# Patient Record
Sex: Female | Born: 1960
Health system: Southern US, Community
[De-identification: ages and names within clinical notes are randomized; demographics above are authoritative.]

## PROBLEM LIST (undated history)

## (undated) DIAGNOSIS — F32A Depression, unspecified: Secondary | ICD-10-CM

## (undated) DIAGNOSIS — E785 Hyperlipidemia, unspecified: Secondary | ICD-10-CM

## (undated) DIAGNOSIS — E119 Type 2 diabetes mellitus without complications: Secondary | ICD-10-CM

## (undated) DIAGNOSIS — I10 Essential (primary) hypertension: Secondary | ICD-10-CM

## (undated) HISTORY — DX: Hyperlipidemia, unspecified: E78.5

## (undated) HISTORY — PX: COLONOSCOPY: SHX174

## (undated) HISTORY — DX: Depression, unspecified: F32.A

---
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
## 1998-01-10 ENCOUNTER — Emergency Department (HOSPITAL_COMMUNITY): Admission: EM | Admit: 1998-01-10 | Discharge: 1998-01-10 | Payer: Self-pay | Admitting: Emergency Medicine

## 1998-01-10 ENCOUNTER — Encounter: Payer: Self-pay | Admitting: Emergency Medicine

## 1998-06-18 ENCOUNTER — Other Ambulatory Visit: Admission: RE | Admit: 1998-06-18 | Discharge: 1998-06-18 | Payer: Self-pay | Admitting: *Deleted

## 2002-11-13 ENCOUNTER — Emergency Department (HOSPITAL_COMMUNITY): Admission: EM | Admit: 2002-11-13 | Discharge: 2002-11-13 | Payer: Self-pay | Admitting: Emergency Medicine

## 2003-05-01 ENCOUNTER — Emergency Department (HOSPITAL_COMMUNITY): Admission: EM | Admit: 2003-05-01 | Discharge: 2003-05-01 | Payer: Self-pay | Admitting: Emergency Medicine

## 2003-06-18 ENCOUNTER — Other Ambulatory Visit: Admission: RE | Admit: 2003-06-18 | Discharge: 2003-06-18 | Payer: Self-pay | Admitting: Obstetrics and Gynecology

## 2003-11-29 ENCOUNTER — Ambulatory Visit (HOSPITAL_COMMUNITY): Admission: RE | Admit: 2003-11-29 | Discharge: 2003-11-29 | Payer: Self-pay | Admitting: Internal Medicine

## 2003-12-11 ENCOUNTER — Ambulatory Visit (HOSPITAL_COMMUNITY): Admission: RE | Admit: 2003-12-11 | Discharge: 2003-12-11 | Payer: Self-pay | Admitting: Internal Medicine

## 2004-06-11 ENCOUNTER — Other Ambulatory Visit: Admission: RE | Admit: 2004-06-11 | Discharge: 2004-06-11 | Payer: Self-pay | Admitting: Obstetrics and Gynecology

## 2005-03-23 ENCOUNTER — Ambulatory Visit (HOSPITAL_COMMUNITY): Admission: RE | Admit: 2005-03-23 | Discharge: 2005-03-23 | Payer: Self-pay | Admitting: Internal Medicine

## 2005-06-14 ENCOUNTER — Other Ambulatory Visit: Admission: RE | Admit: 2005-06-14 | Discharge: 2005-06-14 | Payer: Self-pay | Admitting: Obstetrics and Gynecology

## 2005-11-24 ENCOUNTER — Encounter: Admission: RE | Admit: 2005-11-24 | Discharge: 2005-11-24 | Payer: Self-pay | Admitting: Obstetrics and Gynecology

## 2006-04-28 ENCOUNTER — Ambulatory Visit (HOSPITAL_COMMUNITY): Admission: RE | Admit: 2006-04-28 | Discharge: 2006-04-28 | Payer: Self-pay | Admitting: Obstetrics and Gynecology

## 2007-03-10 ENCOUNTER — Ambulatory Visit (HOSPITAL_COMMUNITY): Admission: RE | Admit: 2007-03-10 | Discharge: 2007-03-10 | Payer: Self-pay | Admitting: Internal Medicine

## 2007-05-10 ENCOUNTER — Ambulatory Visit (HOSPITAL_COMMUNITY): Admission: RE | Admit: 2007-05-10 | Discharge: 2007-05-10 | Payer: Self-pay | Admitting: Obstetrics and Gynecology

## 2007-07-24 ENCOUNTER — Emergency Department (HOSPITAL_COMMUNITY): Admission: EM | Admit: 2007-07-24 | Discharge: 2007-07-24 | Payer: Self-pay | Admitting: Family Medicine

## 2008-07-04 ENCOUNTER — Encounter: Admission: RE | Admit: 2008-07-04 | Discharge: 2008-07-04 | Payer: Self-pay | Admitting: Obstetrics and Gynecology

## 2008-09-10 ENCOUNTER — Ambulatory Visit (HOSPITAL_COMMUNITY): Admission: RE | Admit: 2008-09-10 | Discharge: 2008-09-10 | Payer: Self-pay | Admitting: Urology

## 2009-09-25 ENCOUNTER — Encounter: Admission: RE | Admit: 2009-09-25 | Discharge: 2009-09-25 | Payer: Self-pay | Admitting: Obstetrics & Gynecology

## 2010-02-28 ENCOUNTER — Encounter: Payer: Self-pay | Admitting: Obstetrics and Gynecology

## 2010-03-01 ENCOUNTER — Encounter: Payer: Self-pay | Admitting: Obstetrics and Gynecology

## 2010-12-14 ENCOUNTER — Other Ambulatory Visit: Payer: Self-pay | Admitting: Obstetrics & Gynecology

## 2010-12-14 DIAGNOSIS — Z1231 Encounter for screening mammogram for malignant neoplasm of breast: Secondary | ICD-10-CM

## 2011-01-19 ENCOUNTER — Ambulatory Visit: Payer: Self-pay

## 2011-03-01 ENCOUNTER — Ambulatory Visit (HOSPITAL_COMMUNITY)
Admission: RE | Admit: 2011-03-01 | Discharge: 2011-03-01 | Disposition: A | Discharge status: Home / Self Care | Payer: BC Managed Care – PPO | Source: Ambulatory Visit | Attending: Obstetrics & Gynecology | Admitting: Obstetrics & Gynecology

## 2011-03-01 DIAGNOSIS — Z1231 Encounter for screening mammogram for malignant neoplasm of breast: Secondary | ICD-10-CM | POA: Insufficient documentation

## 2012-05-03 ENCOUNTER — Ambulatory Visit: Payer: BC Managed Care – PPO | Admitting: *Deleted

## 2012-05-16 ENCOUNTER — Other Ambulatory Visit (HOSPITAL_COMMUNITY): Payer: Self-pay | Admitting: Obstetrics and Gynecology

## 2012-05-16 DIAGNOSIS — Z139 Encounter for screening, unspecified: Secondary | ICD-10-CM

## 2012-05-19 ENCOUNTER — Ambulatory Visit: Payer: BC Managed Care – PPO | Admitting: Dietician

## 2012-05-22 ENCOUNTER — Ambulatory Visit (HOSPITAL_COMMUNITY)
Admission: RE | Admit: 2012-05-22 | Discharge: 2012-05-22 | Disposition: A | Discharge status: Home / Self Care | Payer: BC Managed Care – PPO | Source: Ambulatory Visit | Attending: Obstetrics and Gynecology | Admitting: Obstetrics and Gynecology

## 2012-05-22 DIAGNOSIS — Z1231 Encounter for screening mammogram for malignant neoplasm of breast: Secondary | ICD-10-CM | POA: Insufficient documentation

## 2012-05-22 DIAGNOSIS — Z139 Encounter for screening, unspecified: Secondary | ICD-10-CM

## 2012-06-12 ENCOUNTER — Ambulatory Visit: Payer: BC Managed Care – PPO | Admitting: Dietician

## 2012-12-15 ENCOUNTER — Telehealth: Payer: Self-pay

## 2012-12-15 NOTE — Telephone Encounter (Signed)
Pt was referred by Dr. Fanta for screening colonoscopy. LMOM for a return call.  

## 2013-01-16 NOTE — Telephone Encounter (Signed)
Letter to PCP

## 2013-08-29 ENCOUNTER — Other Ambulatory Visit: Payer: Self-pay

## 2013-08-29 DIAGNOSIS — Z1231 Encounter for screening mammogram for malignant neoplasm of breast: Secondary | ICD-10-CM

## 2013-09-20 ENCOUNTER — Ambulatory Visit: Payer: BC Managed Care – PPO

## 2013-10-03 ENCOUNTER — Ambulatory Visit: Payer: BC Managed Care – PPO

## 2013-10-04 ENCOUNTER — Ambulatory Visit
Admission: RE | Admit: 2013-10-04 | Discharge: 2013-10-04 | Disposition: A | Discharge status: Home / Self Care | Payer: BC Managed Care – PPO | Source: Ambulatory Visit

## 2013-10-04 DIAGNOSIS — Z1231 Encounter for screening mammogram for malignant neoplasm of breast: Secondary | ICD-10-CM

## 2013-10-05 ENCOUNTER — Other Ambulatory Visit: Payer: Self-pay | Admitting: Obstetrics & Gynecology

## 2013-10-05 DIAGNOSIS — R928 Other abnormal and inconclusive findings on diagnostic imaging of breast: Secondary | ICD-10-CM

## 2013-10-19 ENCOUNTER — Other Ambulatory Visit: Payer: BC Managed Care – PPO

## 2013-10-23 ENCOUNTER — Ambulatory Visit
Admission: RE | Admit: 2013-10-23 | Discharge: 2013-10-23 | Disposition: A | Discharge status: Home / Self Care | Payer: BC Managed Care – PPO | Source: Ambulatory Visit | Attending: Obstetrics & Gynecology | Admitting: Obstetrics & Gynecology

## 2013-10-23 DIAGNOSIS — R928 Other abnormal and inconclusive findings on diagnostic imaging of breast: Secondary | ICD-10-CM

## 2015-03-20 ENCOUNTER — Other Ambulatory Visit (HOSPITAL_COMMUNITY): Payer: Self-pay | Admitting: Obstetrics & Gynecology

## 2015-03-20 DIAGNOSIS — Z1231 Encounter for screening mammogram for malignant neoplasm of breast: Secondary | ICD-10-CM

## 2015-04-23 ENCOUNTER — Ambulatory Visit (HOSPITAL_COMMUNITY): Payer: Self-pay

## 2015-04-30 ENCOUNTER — Ambulatory Visit (HOSPITAL_COMMUNITY)
Admission: RE | Admit: 2015-04-30 | Discharge: 2015-04-30 | Disposition: A | Discharge status: Home / Self Care | Payer: 59 | Source: Ambulatory Visit | Attending: Obstetrics & Gynecology | Admitting: Obstetrics & Gynecology

## 2015-04-30 DIAGNOSIS — Z1231 Encounter for screening mammogram for malignant neoplasm of breast: Secondary | ICD-10-CM

## 2016-06-30 ENCOUNTER — Other Ambulatory Visit (HOSPITAL_COMMUNITY): Payer: Self-pay | Admitting: Obstetrics & Gynecology

## 2016-06-30 ENCOUNTER — Other Ambulatory Visit: Payer: Self-pay | Admitting: Obstetrics & Gynecology

## 2016-06-30 DIAGNOSIS — Z1231 Encounter for screening mammogram for malignant neoplasm of breast: Secondary | ICD-10-CM

## 2016-07-07 ENCOUNTER — Ambulatory Visit (HOSPITAL_COMMUNITY): Payer: 59

## 2016-07-22 DIAGNOSIS — E663 Overweight: Secondary | ICD-10-CM | POA: Diagnosis not present

## 2016-07-22 DIAGNOSIS — I10 Essential (primary) hypertension: Secondary | ICD-10-CM | POA: Diagnosis not present

## 2016-07-22 DIAGNOSIS — E1165 Type 2 diabetes mellitus with hyperglycemia: Secondary | ICD-10-CM | POA: Diagnosis not present

## 2016-07-22 DIAGNOSIS — Z0000 Encounter for general adult medical examination without abnormal findings: Secondary | ICD-10-CM | POA: Diagnosis not present

## 2016-07-27 DIAGNOSIS — Z6830 Body mass index (BMI) 30.0-30.9, adult: Secondary | ICD-10-CM | POA: Diagnosis not present

## 2016-07-27 DIAGNOSIS — E784 Other hyperlipidemia: Secondary | ICD-10-CM | POA: Diagnosis not present

## 2016-07-27 DIAGNOSIS — E119 Type 2 diabetes mellitus without complications: Secondary | ICD-10-CM | POA: Diagnosis not present

## 2016-07-27 DIAGNOSIS — I10 Essential (primary) hypertension: Secondary | ICD-10-CM | POA: Diagnosis not present

## 2017-05-31 DIAGNOSIS — Z0001 Encounter for general adult medical examination with abnormal findings: Secondary | ICD-10-CM | POA: Diagnosis not present

## 2017-05-31 DIAGNOSIS — E119 Type 2 diabetes mellitus without complications: Secondary | ICD-10-CM | POA: Diagnosis not present

## 2017-05-31 DIAGNOSIS — I10 Essential (primary) hypertension: Secondary | ICD-10-CM | POA: Diagnosis not present

## 2017-05-31 DIAGNOSIS — E7849 Other hyperlipidemia: Secondary | ICD-10-CM | POA: Diagnosis not present

## 2017-06-09 DIAGNOSIS — E663 Overweight: Secondary | ICD-10-CM | POA: Diagnosis not present

## 2017-06-09 DIAGNOSIS — E7849 Other hyperlipidemia: Secondary | ICD-10-CM | POA: Diagnosis not present

## 2017-06-09 DIAGNOSIS — E119 Type 2 diabetes mellitus without complications: Secondary | ICD-10-CM | POA: Diagnosis not present

## 2017-06-09 DIAGNOSIS — I10 Essential (primary) hypertension: Secondary | ICD-10-CM | POA: Diagnosis not present

## 2017-06-09 DIAGNOSIS — E1165 Type 2 diabetes mellitus with hyperglycemia: Secondary | ICD-10-CM | POA: Diagnosis not present

## 2017-06-09 DIAGNOSIS — Z0000 Encounter for general adult medical examination without abnormal findings: Secondary | ICD-10-CM | POA: Diagnosis not present

## 2017-12-14 DIAGNOSIS — Z0000 Encounter for general adult medical examination without abnormal findings: Secondary | ICD-10-CM | POA: Diagnosis not present

## 2017-12-14 DIAGNOSIS — E1165 Type 2 diabetes mellitus with hyperglycemia: Secondary | ICD-10-CM | POA: Diagnosis not present

## 2017-12-14 DIAGNOSIS — I10 Essential (primary) hypertension: Secondary | ICD-10-CM | POA: Diagnosis not present

## 2017-12-14 DIAGNOSIS — E119 Type 2 diabetes mellitus without complications: Secondary | ICD-10-CM | POA: Diagnosis not present

## 2018-01-11 DIAGNOSIS — I10 Essential (primary) hypertension: Secondary | ICD-10-CM | POA: Diagnosis not present

## 2018-01-11 DIAGNOSIS — F411 Generalized anxiety disorder: Secondary | ICD-10-CM | POA: Diagnosis not present

## 2018-01-11 DIAGNOSIS — E119 Type 2 diabetes mellitus without complications: Secondary | ICD-10-CM | POA: Diagnosis not present

## 2018-01-11 DIAGNOSIS — Z6830 Body mass index (BMI) 30.0-30.9, adult: Secondary | ICD-10-CM | POA: Diagnosis not present

## 2018-03-07 DIAGNOSIS — Z1231 Encounter for screening mammogram for malignant neoplasm of breast: Secondary | ICD-10-CM | POA: Diagnosis not present

## 2018-03-07 DIAGNOSIS — Z113 Encounter for screening for infections with a predominantly sexual mode of transmission: Secondary | ICD-10-CM | POA: Diagnosis not present

## 2018-03-07 DIAGNOSIS — Z1151 Encounter for screening for human papillomavirus (HPV): Secondary | ICD-10-CM | POA: Diagnosis not present

## 2018-03-07 DIAGNOSIS — Z6833 Body mass index (BMI) 33.0-33.9, adult: Secondary | ICD-10-CM | POA: Diagnosis not present

## 2018-03-07 DIAGNOSIS — R300 Dysuria: Secondary | ICD-10-CM | POA: Diagnosis not present

## 2018-03-07 DIAGNOSIS — Z01419 Encounter for gynecological examination (general) (routine) without abnormal findings: Secondary | ICD-10-CM | POA: Diagnosis not present

## 2018-06-08 DIAGNOSIS — I10 Essential (primary) hypertension: Secondary | ICD-10-CM | POA: Diagnosis not present

## 2018-06-08 DIAGNOSIS — E119 Type 2 diabetes mellitus without complications: Secondary | ICD-10-CM | POA: Diagnosis not present

## 2018-06-08 DIAGNOSIS — L209 Atopic dermatitis, unspecified: Secondary | ICD-10-CM | POA: Diagnosis not present

## 2018-06-12 DIAGNOSIS — F411 Generalized anxiety disorder: Secondary | ICD-10-CM | POA: Diagnosis not present

## 2018-06-12 DIAGNOSIS — F4320 Adjustment disorder, unspecified: Secondary | ICD-10-CM | POA: Diagnosis not present

## 2018-06-27 ENCOUNTER — Other Ambulatory Visit: Payer: Self-pay

## 2018-06-27 ENCOUNTER — Encounter (HOSPITAL_COMMUNITY): Payer: Self-pay | Admitting: Psychiatry

## 2018-06-27 ENCOUNTER — Ambulatory Visit (INDEPENDENT_AMBULATORY_CARE_PROVIDER_SITE_OTHER): Payer: 59 | Admitting: Psychiatry

## 2018-06-27 DIAGNOSIS — F4321 Adjustment disorder with depressed mood: Secondary | ICD-10-CM
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
## 2018-06-27 NOTE — Patient Instructions (Addendum)
1. No medication is started at this time. Please contact the office if any worsening in your symptoms/if interested in starting antidepressant.  2. Referral to therapy  3. Next appointment: 6/12 a 1:20  4. The front desk will contact you regarding FMLA paperwork, and obtaining record from Dr. Felecia Shelling

## 2018-06-28 ENCOUNTER — Telehealth (HOSPITAL_COMMUNITY): Payer: Self-pay | Admitting: Psychiatry

## 2018-06-28 NOTE — Telephone Encounter (Signed)
D:  Dr. Vanetta Shawl referred to [facility].  A:  Placed call to orient pt and give her a start date (07-04-18) at 9 a.m per patient's request.  Inform  Dr. Vanetta Shawl.  R:  Pt receptive.
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
## 2018-07-21 DIAGNOSIS — F4321 Adjustment disorder with depressed mood: Secondary | ICD-10-CM | POA: Diagnosis not present

## 2018-07-21 DIAGNOSIS — F438 Other reactions to severe stress: Secondary | ICD-10-CM | POA: Diagnosis not present

## 2018-07-25 ENCOUNTER — Ambulatory Visit (HOSPITAL_COMMUNITY): Payer: Self-pay | Admitting: Psychiatry

## 2018-07-25 ENCOUNTER — Other Ambulatory Visit: Payer: Self-pay

## 2018-07-25 ENCOUNTER — Encounter (HOSPITAL_COMMUNITY): Payer: Self-pay | Admitting: Psychiatry

## 2018-07-25 ENCOUNTER — Ambulatory Visit (INDEPENDENT_AMBULATORY_CARE_PROVIDER_SITE_OTHER): Payer: BC Managed Care – PPO | Admitting: Psychiatry

## 2018-07-25 DIAGNOSIS — F331 Major depressive disorder, recurrent, moderate: Secondary | ICD-10-CM | POA: Diagnosis not present

## 2018-07-25 MED ORDER — MIRTAZAPINE 15 MG PO TABS: ORAL_TABLET | ORAL | 0 refills | Status: DC

## 2018-07-25 NOTE — Patient Instructions (Signed)
1. Start mirtazapine 7.5 mg at night for one week, then 15 mg at night  2. Next appointment : 7/14 at 11:20

## 2018-07-27 ENCOUNTER — Telehealth (HOSPITAL_COMMUNITY): Payer: Self-pay | Admitting: Psychiatry

## 2018-07-27 DIAGNOSIS — F438 Other reactions to severe stress: Secondary | ICD-10-CM | POA: Diagnosis not present

## 2018-07-27 DIAGNOSIS — F4321 Adjustment disorder with depressed mood: Secondary | ICD-10-CM | POA: Diagnosis not present

## 2018-07-27 NOTE — Telephone Encounter (Signed)
Advise her to send fax to [place] if any forms/letter need to be submitted.

## 2018-07-27 NOTE — Telephone Encounter (Signed)
Called patient and informed her of message.  Patient understood

## 2018-07-27 NOTE — Telephone Encounter (Signed)
Patient called and said that she have been having a "bad couple of days".   She had a crying episode on 07-26-2018.  Patient said that feels like she isn't ready to go  back to work until her next appointment on 08/22/2018.  Please advise

## 2018-08-02 ENCOUNTER — Encounter (HOSPITAL_COMMUNITY): Payer: Self-pay | Admitting: Psychiatry

## 2018-08-02 ENCOUNTER — Telehealth (HOSPITAL_COMMUNITY): Payer: Self-pay | Admitting: *Deleted

## 2018-08-02 NOTE — Telephone Encounter (Signed)
DR HISADA PATIENT CAME INTO OFFICE STATING THAT SHE NEEDS A WORK NOTE STATING THE EXTENSION DISCUSSED FOR HER TO RETURN TO WORK ALONG WITH THE OFFICE NOTES FROM  07-25-2018

## 2018-08-02 NOTE — Telephone Encounter (Signed)
Letter is done.

## 2018-08-03 ENCOUNTER — Ambulatory Visit (HOSPITAL_COMMUNITY): Payer: Self-pay | Admitting: Psychiatry

## 2018-08-03 DIAGNOSIS — F4321 Adjustment disorder with depressed mood: Secondary | ICD-10-CM | POA: Diagnosis not present

## 2018-08-03 DIAGNOSIS — F438 Other reactions to severe stress: Secondary | ICD-10-CM | POA: Diagnosis not present
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
## 2018-08-17 DIAGNOSIS — F438 Other reactions to severe stress: Secondary | ICD-10-CM | POA: Diagnosis not present

## 2018-08-17 DIAGNOSIS — F4321 Adjustment disorder with depressed mood: Secondary | ICD-10-CM | POA: Diagnosis not present

## 2018-08-22 ENCOUNTER — Encounter (HOSPITAL_COMMUNITY): Payer: Self-pay | Admitting: Psychiatry

## 2018-08-22 ENCOUNTER — Ambulatory Visit (INDEPENDENT_AMBULATORY_CARE_PROVIDER_SITE_OTHER): Payer: BC Managed Care – PPO | Admitting: Psychiatry

## 2018-08-22 ENCOUNTER — Other Ambulatory Visit: Payer: Self-pay

## 2018-08-22 DIAGNOSIS — F331 Major depressive disorder, recurrent, moderate: Secondary | ICD-10-CM

## 2018-08-22 MED ORDER — ESCITALOPRAM OXALATE 10 MG PO TABS: ORAL_TABLET | ORAL | 0 refills | Status: DC

## 2018-08-22 NOTE — Patient Instructions (Signed)
1. Continue mirtazapine 7.5 mg daily  2. Start lexapro 5 mg daily for one week, then 10 mg daily  3. Next appointment: 8/13 at 10 AM

## 2018-08-24 DIAGNOSIS — F438 Other reactions to severe stress: Secondary | ICD-10-CM | POA: Diagnosis not present

## 2018-08-24 DIAGNOSIS — F4321 Adjustment disorder with depressed mood: Secondary | ICD-10-CM | POA: Diagnosis not present

## 2018-08-31 DIAGNOSIS — F438 Other reactions to severe stress: Secondary | ICD-10-CM | POA: Diagnosis not present

## 2018-08-31 DIAGNOSIS — F4321 Adjustment disorder with depressed mood: Secondary | ICD-10-CM | POA: Diagnosis not present

## 2018-09-07 DIAGNOSIS — E1165 Type 2 diabetes mellitus with hyperglycemia: Secondary | ICD-10-CM | POA: Diagnosis not present

## 2018-09-07 DIAGNOSIS — Z0001 Encounter for general adult medical examination with abnormal findings: Secondary | ICD-10-CM | POA: Diagnosis not present

## 2018-09-07 DIAGNOSIS — I10 Essential (primary) hypertension: Secondary | ICD-10-CM | POA: Diagnosis not present

## 2018-09-07 DIAGNOSIS — E785 Hyperlipidemia, unspecified: Secondary | ICD-10-CM | POA: Diagnosis not present

## 2018-09-13 DIAGNOSIS — I10 Essential (primary) hypertension: Secondary | ICD-10-CM | POA: Diagnosis not present

## 2018-09-13 DIAGNOSIS — E7849 Other hyperlipidemia: Secondary | ICD-10-CM | POA: Diagnosis not present

## 2018-09-13 DIAGNOSIS — Z0001 Encounter for general adult medical examination with abnormal findings: Secondary | ICD-10-CM | POA: Diagnosis not present

## 2018-09-13 DIAGNOSIS — E119 Type 2 diabetes mellitus without complications: Secondary | ICD-10-CM | POA: Diagnosis not present

## 2018-09-14 DIAGNOSIS — F4321 Adjustment disorder with depressed mood: Secondary | ICD-10-CM | POA: Diagnosis not present

## 2018-09-14 DIAGNOSIS — F438 Other reactions to severe stress: Secondary | ICD-10-CM | POA: Diagnosis not present
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
## 2018-09-21 ENCOUNTER — Ambulatory Visit (INDEPENDENT_AMBULATORY_CARE_PROVIDER_SITE_OTHER): Payer: BC Managed Care – PPO | Admitting: Psychiatry

## 2018-09-21 ENCOUNTER — Other Ambulatory Visit: Payer: Self-pay

## 2018-09-21 ENCOUNTER — Encounter (HOSPITAL_COMMUNITY): Payer: Self-pay | Admitting: Psychiatry

## 2018-09-21 DIAGNOSIS — F331 Major depressive disorder, recurrent, moderate: Secondary | ICD-10-CM | POA: Diagnosis not present

## 2018-09-21 MED ORDER — ESCITALOPRAM OXALATE 20 MG PO TABS: 20.0000 mg | ORAL_TABLET | Freq: Every day | ORAL | 0 refills | Status: DC

## 2018-09-21 MED ORDER — MIRTAZAPINE 15 MG PO TABS: 7.5000 mg | ORAL_TABLET | Freq: Every day | ORAL | 0 refills | Status: DC

## 2018-09-21 NOTE — Patient Instructions (Addendum)
1. Increase lexapro 20 mg daily  2. Continue mirtazapine 7.5 mg daily 3. Next appointment: 9/17 at 10 AM

## 2018-09-28 DIAGNOSIS — F438 Other reactions to severe stress: Secondary | ICD-10-CM | POA: Diagnosis not present

## 2018-09-28 DIAGNOSIS — F4321 Adjustment disorder with depressed mood: Secondary | ICD-10-CM | POA: Diagnosis not present

## 2018-10-18 ENCOUNTER — Other Ambulatory Visit (HOSPITAL_COMMUNITY): Payer: Self-pay | Admitting: Psychiatry

## 2018-10-18 MED ORDER — ESCITALOPRAM OXALATE 20 MG PO TABS: 20.0000 mg | ORAL_TABLET | Freq: Every day | ORAL | 0 refills | Status: DC

## 2018-10-19 DIAGNOSIS — F4321 Adjustment disorder with depressed mood: Secondary | ICD-10-CM | POA: Diagnosis not present

## 2018-10-19 DIAGNOSIS — F438 Other reactions to severe stress: Secondary | ICD-10-CM | POA: Diagnosis not present
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
## 2018-10-26 ENCOUNTER — Other Ambulatory Visit: Payer: Self-pay

## 2018-10-26 ENCOUNTER — Ambulatory Visit (INDEPENDENT_AMBULATORY_CARE_PROVIDER_SITE_OTHER): Payer: BC Managed Care – PPO | Admitting: Psychiatry

## 2018-10-26 ENCOUNTER — Encounter (HOSPITAL_COMMUNITY): Payer: Self-pay | Admitting: Psychiatry

## 2018-10-26 DIAGNOSIS — F331 Major depressive disorder, recurrent, moderate: Secondary | ICD-10-CM

## 2018-10-26 MED ORDER — ESCITALOPRAM OXALATE 20 MG PO TABS: 20.0000 mg | ORAL_TABLET | Freq: Every day | ORAL | 0 refills | Status: DC

## 2018-10-26 MED ORDER — MIRTAZAPINE 15 MG PO TABS: 7.5000 mg | ORAL_TABLET | Freq: Every day | ORAL | 0 refills | Status: DC

## 2018-10-26 NOTE — Patient Instructions (Signed)
1. Continue lexapro 20 mg daily  2. Continue mirtazapine 7.5 mg daily  3. Next appointment: 10/15 at 9:30

## 2018-11-02 DIAGNOSIS — F438 Other reactions to severe stress: Secondary | ICD-10-CM | POA: Diagnosis not present

## 2018-11-02 DIAGNOSIS — F4321 Adjustment disorder with depressed mood: Secondary | ICD-10-CM | POA: Diagnosis not present
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
## 2018-11-22 ENCOUNTER — Encounter (HOSPITAL_COMMUNITY): Payer: Self-pay | Admitting: Psychiatry

## 2018-11-22 ENCOUNTER — Ambulatory Visit (INDEPENDENT_AMBULATORY_CARE_PROVIDER_SITE_OTHER): Payer: BC Managed Care – PPO | Admitting: Psychiatry

## 2018-11-22 ENCOUNTER — Other Ambulatory Visit: Payer: Self-pay

## 2018-11-22 DIAGNOSIS — F330 Major depressive disorder, recurrent, mild: Secondary | ICD-10-CM

## 2018-11-22 MED ORDER — ESCITALOPRAM OXALATE 20 MG PO TABS: 20.0000 mg | ORAL_TABLET | Freq: Every day | ORAL | 1 refills | Status: DC

## 2018-11-22 MED ORDER — MIRTAZAPINE 15 MG PO TABS: 7.5000 mg | ORAL_TABLET | Freq: Every day | ORAL | 1 refills | Status: DC

## 2018-11-22 NOTE — Patient Instructions (Signed)
1.Continuelexapro 20 mg daily  2. Continue mirtazapine 7.5 mg daily  3. Next appointment:as needed (would recommend to have follow up in January if your insurance covers this visit. If not, please consider follow up with your primary care doctor)

## 2018-11-23 ENCOUNTER — Ambulatory Visit (HOSPITAL_COMMUNITY): Payer: BC Managed Care – PPO | Admitting: Psychiatry

## 2019-06-05 ENCOUNTER — Telehealth (HOSPITAL_COMMUNITY): Payer: Self-pay | Admitting: *Deleted

## 2019-06-05 NOTE — Telephone Encounter (Signed)
Dr Cory Roughen -# [phone] has called for patients Psychiatric Disability Claim  Call back requested.

## 2019-06-06 NOTE — Telephone Encounter (Signed)
Could you contact Dr Cory Roughen and notify him that I am out of work since yesterday. Schedule for peer to peer review meeting sometime next week when he is available. Please also make sure that we have consent form from the patient.

## 2019-06-06 NOTE — Telephone Encounter (Signed)
Per Provider :: Could you contact Dr Cory Roughen and notify him that I am out of work since yesterday. Schedule for peer to peer review meeting sometime next week when he is available. Please also make sure that we have consent form from the patient.   Had to LVM for return Call to schedule Peer-Peer Review

## 2019-10-02 ENCOUNTER — Encounter: Payer: Self-pay | Admitting: *Deleted

## 2019-11-28 ENCOUNTER — Ambulatory Visit (INDEPENDENT_AMBULATORY_CARE_PROVIDER_SITE_OTHER): Payer: Self-pay | Admitting: *Deleted

## 2019-11-28 ENCOUNTER — Other Ambulatory Visit: Payer: Self-pay

## 2019-11-28 VITALS — Ht 67.0 in | Wt 214.2 lb

## 2019-11-28 DIAGNOSIS — Z1211 Encounter for screening for malignant neoplasm of colon: Secondary | ICD-10-CM

## 2019-11-28 MED ORDER — NA SULFATE-K SULFATE-MG SULF 17.5-3.13-1.6 GM/177ML PO SOLN: 1.0000 | Freq: Once | ORAL | 0 refills | Status: AC

## 2019-11-28 NOTE — Progress Notes (Signed)
Gastroenterology Pre-Procedure Review  Request Date: 11/28/2019 Requesting Physician: Dr. Felecia Shelling, Pt says last TCS done years ago in [place], pt could not remember who did it, no record in Epic  PATIENT REVIEW QUESTIONS: The patient responded to the following health history questions as indicated:    1. Diabetes Melitis: yes 2. Joint replacements in the past 12 months: no 3. Major health problems in the past 3 months: no 4. Has an artificial valve or MVP: no 5. Has a defibrillator: no 6. Has been advised in past to take antibiotics in advance of a procedure like teeth cleaning: no 7. Family history of colon cancer: no  8. Alcohol Use: yes, 1 glass of wine every 2 weeks 9. Illicit drug Use: no 10. History of sleep apnea: no  11. History of coronary artery or other vascular stents placed within the last 12 months: no 12. History of any prior anesthesia complications: no 13. Body mass index is 33.55 kg/m.    MEDICATIONS & ALLERGIES:    Patient reports the following regarding taking any blood thinners:   Plavix? no Aspirin? no Coumadin? no Brilinta? no Xarelto? no Eliquis? no Pradaxa? no Savaysa? no Effient? no  Patient confirms/reports the following medications:  Current Outpatient Medications  Medication Sig Dispense Refill  . atorvastatin (LIPITOR) 10 MG tablet Take 10 mg by mouth daily.    Marland Kitchen escitalopram (LEXAPRO) 20 MG tablet Take 1 tablet (20 mg total) by mouth daily. (Patient taking differently: Take 20 mg by mouth as needed. ) 90 tablet 1  . losartan (COZAAR) 50 MG tablet Take 50 mg by mouth daily.    . metFORMIN (GLUCOPHAGE) 850 MG tablet Take 850 mg by mouth 2 (two) times daily.    . mirtazapine (REMERON) 15 MG tablet Take 0.5 tablets (7.5 mg total) by mouth at bedtime. 45 tablet 1  . Multiple Vitamin (MULTIVITAMIN ADULT PO) Take by mouth daily.    Marland Kitchen VITAMIN D PO Take by mouth every 30 (thirty) days.     No current facility-administered medications for this visit.     Patient confirms/reports the following allergies:  No Known Allergies  No orders of the defined types were placed in this encounter.   AUTHORIZATION INFORMATION Primary Insurance: [employer],  [place] #:[id number] ,  Group #: [id number] Pre-Cert / Berkley Harvey required: Pre-Cert / Auth #:   SCHEDULE INFORMATION: Procedure has been scheduled as follows:  Date: 01/16/2020, Time: 12:00 Location: [facility] with Dr. Jena Gauss  This Gastroenterology Pre-Precedure Review Form is being routed to the following provider(s): Ermalinda Memos, PA

## 2019-11-28 NOTE — Patient Instructions (Signed)
Christina Vincent  Oct 15, 1960 MRN: [id number]     Procedure Date: 01/16/2020 Time to register: 11:00 am Place to register: [facility] Procedure Time: 12:00 pm Scheduled provider: Dr. Gala Romney    PREPARATION FOR COLONOSCOPY WITH SUPREP BOWEL PREP KIT  Note: Suprep Bowel Prep Kit is a split-dose (2day) regimen. Consumption of BOTH 6-ounce bottles is required for a complete prep.  Please notify us immediately if you are diabetic, take iron supplements, or if you are on Coumadin or any other blood thinners.  Please hold the following medications: See letter                                                                                                                                                  2 DAYS BEFORE PROCEDURE:  DATE: 01/14/2020   DAY: Monday Begin clear liquid diet AFTER your lunch meal. NO SOLID FOODS after this point.  1 DAY BEFORE PROCEDURE:  DATE: 01/15/2020   DAY: Tuesday Continue clear liquids the entire day - NO SOLID FOOD.   Diabetic medications adjustments for today: See letter  At 6:00pm: Complete steps 1 through 4 below, using ONE (1) 6-ounce bottle, before going to bed. Step 1:  Pour ONE (1) 6-ounce bottle of SUPREP liquid into the mixing container.  Step 2:  Add cool drinking water to the 16 ounce line on the container and mix.  Note: Dilute the solution concentrate as directed prior to use. Step 3:  DRINK ALL the liquid in the container. Step 4:  You MUST drink an additional two (2) or more 16 ounce containers of water over the next one (1) hour.   Continue clear liquids.  DAY OF PROCEDURE:   DATE: 01/16/2020   DAY: Wednesday If you take medications for your heart, blood pressure, or breathing, you may take these medications.  Diabetic medications adjustments for today: See letter  5 hours before your procedure at :  7:00 am Step 1:  Pour ONE (1) 6-ounce bottle of SUPREP liquid into the mixing container.  Step 2:  Add cool drinking water to the 16  ounce line on the container and mix.  Note: Dilute the solution concentrate as directed prior to use. Step 3:  DRINK ALL the liquid in the container. Step 4:  You MUST drink an additional two (2) or more 16 ounce containers of water over the next one (1) hour. You MUST complete the final glass of water at least 3 hours before your colonoscopy. Nothing by mouth past 9:00 am.  You may take your morning medications with sip of water unless we have instructed otherwise.    Please see below for Dietary Information.  CLEAR LIQUIDS INCLUDE:  Water Jello (NOT red in color)   Ice Popsicles (NOT red in color)   Tea (sugar ok, no milk/cream) Powdered fruit flavored drinks  Coffee (sugar ok, no milk/cream) Gatorade/ Lemonade/ Kool-Aid  (NOT red in color)   Juice: apple, white grape, white cranberry Soft drinks  Clear bullion, consomme, broth (fat free beef/chicken/vegetable)  Carbonated beverages (any kind)  Strained chicken noodle soup Hard Candy   Remember: Clear liquids are liquids that will allow you to see your fingers on the other side of a clear glass. Be sure liquids are NOT red in color, and not cloudy, but CLEAR.  DO NOT EAT OR DRINK ANY OF THE FOLLOWING:  Dairy products of any kind   Cranberry juice Tomato juice / V8 juice   Grapefruit juice Orange juice     Red grape juice  Do not eat any solid foods, including such foods as: cereal, oatmeal, yogurt, fruits, vegetables, creamed soups, eggs, bread, crackers, pureed foods in a blender, etc.   HELPFUL HINTS FOR DRINKING PREP SOLUTION:   Make sure prep is extremely cold. Mix and refrigerate the the morning of the prep. You may also put in the freezer.   You may try mixing some Crystal Light or Country Time Lemonade if you prefer. Mix in small amounts; add more if necessary.  Try drinking through a straw  Rinse mouth with water or a mouthwash between glasses, to remove after-taste.  Try sipping on a cold beverage /ice/ popsicles  between glasses of prep.  Place a piece of sugar-free hard candy in mouth between glasses.  If you become nauseated, try consuming smaller amounts, or stretch out the time between glasses. Stop for 30-60 minutes, then slowly start back drinking.     OTHER INSTRUCTIONS  You will need a responsible adult at least 57 years of age to accompany you and drive you home. This person must remain in the waiting room during your procedure. The hospital will cancel your procedure if you do not have a responsible adult with you.   1. Wear loose fitting clothing that is easily removed. 2. Leave jewelry and other valuables at home.  3. Remove all body piercing jewelry and leave at home. 4. Total time from sign-in until discharge is approximately 2-3 hours. 5. You should go home directly after your procedure and rest. You can resume normal activities the day after your procedure. 6. The day of your procedure you should not:  Drive  Make legal decisions  Operate machinery  Drink alcohol  Return to work   You may call the office (Dept: [phone]) before 5:00pm, or page the doctor on call [phone]) after 5:00pm, for further instructions, if necessary.   Insurance Information YOU WILL NEED TO CHECK WITH YOUR INSURANCE COMPANY FOR THE BENEFITS OF COVERAGE YOU HAVE FOR THIS PROCEDURE.  UNFORTUNATELY, NOT ALL INSURANCE COMPANIES HAVE BENEFITS TO COVER ALL OR PART OF THESE TYPES OF PROCEDURES.  IT IS YOUR RESPONSIBILITY TO CHECK YOUR BENEFITS, HOWEVER, WE WILL BE GLAD TO ASSIST YOU WITH ANY CODES YOUR INSURANCE COMPANY MAY NEED.    PLEASE NOTE THAT MOST INSURANCE COMPANIES WILL NOT COVER A SCREENING COLONOSCOPY FOR PEOPLE UNDER THE AGE OF 50  IF YOU HAVE BCBS INSURANCE, YOU MAY HAVE BENEFITS FOR A SCREENING COLONOSCOPY BUT IF POLYPS ARE FOUND THE DIAGNOSIS WILL CHANGE AND THEN YOU MAY HAVE A DEDUCTIBLE THAT WILL NEED TO BE MET. SO PLEASE MAKE SURE YOU CHECK YOUR BENEFITS FOR A SCREENING  COLONOSCOPY AS WELL AS A DIAGNOSTIC COLONOSCOPY.

## 2019-12-24 ENCOUNTER — Telehealth: Payer: Self-pay | Admitting: *Deleted

## 2019-12-24 NOTE — Telephone Encounter (Signed)
Tried to call pt.  VM full and could not accept any messages. ?

## 2019-12-24 NOTE — Telephone Encounter (Signed)
Lmom for pt to call me back. 

## 2019-12-24 NOTE — Progress Notes (Signed)
Recommend propofol due to medications. Will need OV to schedule.

## 2019-12-25 NOTE — Progress Notes (Signed)
Called pt and made her aware that we need to cancel procedure and Covid screening.  She was made aware that she needs ov with provider to arrange TCS.  Pt scheduled ov for 12/28/2019 at 9:00 with Ermalinda Memos, PA.

## 2019-12-27 NOTE — Progress Notes (Signed)
Referring Provider: Avon Gully, MD Primary Care Physician:  Avon Gully, MD Primary Gastroenterologist:  Dr. Jena Gauss  Chief Complaint  Patient presents with  . Colonoscopy    last tcs approx 1998 at [facility]??  . rectal discomfort    had hemorrhoids in August    HPI:   Christina Vincent is a 59 y.o. female presenting today at the request of Avon Gully, MD for consult colonoscopy.  Patient was initially a nurse triage; however, recommended propofol due to medications, so she was scheduled for office visit.  Had a colonoscopy in the 1990s due to rectal bleeding and rectal discomfort. Found to have a hemorrhoid.   Had a hemorrhoid in August. Associated rectal bleeding and rectal discomfort. Bright red blood on toilet tissue and a little on the stool. Just lasted for 1 day. Continues with rectal discomfort. Used preparation H. Symptoms were pretty severe but she continues with pressure, burning, and itching in her rectum. Has trouble getting clean. No trouble with constipation. 2 BMs daily. Doesn't know if her hemorrhoids are prolapsed. Some sharp pains at times but more of a burning. Like she has a "tare". She was having constipation in August. Drank apple and prune juice which resolved symptoms. Rectal discomfort is maybe once a week.    Occasional abdominal. Nothing on a routine basis. Likely related to what she has eaten. Metformin also causes some GI upset at times. Rare GERD symptoms. No dysphagia. No nausea or vomiting. Weight is stable.   Past Medical History:  Diagnosis Date  . Depression   . Diabetes (HCC)   . Dyslipidemia   . Hypertension     Past Surgical History:  Procedure Laterality Date  . COLONOSCOPY     1990's; hemorrhoids    Current Outpatient Medications  Medication Sig Dispense Refill  . atorvastatin (LIPITOR) 10 MG tablet Take 10 mg by mouth daily.    Marland Kitchen escitalopram (LEXAPRO) 20 MG tablet Take 1 tablet (20 mg total) by mouth daily. (Patient taking  differently: Take 20 mg by mouth as needed. ) 90 tablet 1  . losartan (COZAAR) 50 MG tablet Take 50 mg by mouth daily.    . metFORMIN (GLUCOPHAGE) 850 MG tablet Take 850 mg by mouth 2 (two) times daily.    . mirtazapine (REMERON) 15 MG tablet Take 0.5 tablets (7.5 mg total) by mouth at bedtime. 45 tablet 1  . Multiple Vitamin (MULTIVITAMIN ADULT PO) Take by mouth daily.    Marland Kitchen VITAMIN D PO Take by mouth every 30 (thirty) days.     No current facility-administered medications for this visit.    Allergies as of 12/28/2019  . (No Known Allergies)    Family History  Problem Relation Age of Onset  . Alcohol abuse Father   . Colon cancer Neg Hx     Social History   Socioeconomic History  . Marital status: Married    Spouse name: Not on file  . Number of children: Not on file  . Years of education: Not on file  . Highest education level: Not on file  Occupational History  . Not on file  Tobacco Use  . Smoking status: Former Games developer  . Smokeless tobacco: Never Used  . Tobacco comment: quit 1988  Substance and Sexual Activity  . Alcohol use: Yes    Comment: occ glass of wine  . Drug use: Never  . Sexual activity: Not on file  Other Topics Concern  . Not on file  Social History Narrative  .  Not on file   Social Determinants of Health   Financial Resource Strain:   . Difficulty of Paying Living Expenses: Not on file  Food Insecurity:   . Worried About Programme researcher, broadcasting/film/video in the Last Year: Not on file  . Ran Out of Food in the Last Year: Not on file  Transportation Needs:   . Lack of Transportation (Medical): Not on file  . Lack of Transportation (Non-Medical): Not on file  Physical Activity:   . Days of Exercise per Week: Not on file  . Minutes of Exercise per Session: Not on file  Stress:   . Feeling of Stress : Not on file  Social Connections:   . Frequency of Communication with Friends and Family: Not on file  . Frequency of Social Gatherings with Friends and Family:  Not on file  . Attends Religious Services: Not on file  . Active Member of Clubs or Organizations: Not on file  . Attends Banker Meetings: Not on file  . Marital Status: Not on file  Intimate Partner Violence:   . Fear of Current or Ex-Partner: Not on file  . Emotionally Abused: Not on file  . Physically Abused: Not on file  . Sexually Abused: Not on file    Review of Systems: Gen: Denies any fever, chills, cold or flulike symptoms, lightheadedness, dizziness, presyncope, syncope. CV: Denies chest pain or heart palpitations. Resp: Denies shortness of breath or cough. GI: See HPI GU : Denies urinary burning, urinary frequency, urinary hesitancy Derm: Denies rash Psych: Admits to depression Heme: See HPI  Physical Exam: BP 140/85   Pulse 97   Temp (!) 96.8 F (36 C) (Temporal)   Ht 5\' 7"  (1.702 m)   Wt 215 lb (97.5 kg)   BMI 33.67 kg/m  General:   Alert and oriented. Pleasant and cooperative. Well-nourished and well-developed.  Head:  Normocephalic and atraumatic. Eyes:  Without icterus, sclera clear and conjunctiva pink.  Ears:  Normal auditory acuity. Lungs:  Clear to auscultation bilaterally. No wheezes, rales, or rhonchi. No distress.  Heart:  S1, S2 present without murmurs appreciated.  Abdomen:  +BS, soft, non-tender and non-distended. No HSM noted. No guarding or rebound.  Small reducible umbilical hernia  Rectal: Small external hemorrhoid versus prolapsed internal hemorrhoid.  Internal exam is limited due to rectal discomfort. Msk:  Symmetrical without gross deformities. Normal posture. Extremities:  Without edema. Neurologic:  Alert and  oriented x4;  grossly normal neurologically. Skin:  Intact without significant lesions or rashes. Psych: Normal mood and affect.

## 2019-12-28 ENCOUNTER — Other Ambulatory Visit: Payer: Self-pay

## 2019-12-28 ENCOUNTER — Encounter: Payer: Self-pay | Admitting: Gastroenterology

## 2019-12-28 ENCOUNTER — Ambulatory Visit (INDEPENDENT_AMBULATORY_CARE_PROVIDER_SITE_OTHER): Payer: PRIVATE HEALTH INSURANCE | Admitting: Gastroenterology

## 2019-12-28 DIAGNOSIS — R208 Other disturbances of skin sensation: Secondary | ICD-10-CM | POA: Diagnosis not present

## 2019-12-28 DIAGNOSIS — Z1211 Encounter for screening for malignant neoplasm of colon: Secondary | ICD-10-CM | POA: Diagnosis not present

## 2019-12-28 DIAGNOSIS — K625 Hemorrhage of anus and rectum: Secondary | ICD-10-CM | POA: Insufficient documentation

## 2019-12-28 NOTE — Patient Instructions (Signed)
We will get you scheduled for colonoscopy in the near future with Dr. Jena Gauss.  I am calling in a hemorrhoid cream to [facility].  They will call you when the cream is ready to pick up.  Please apply this twice daily to rectum for the next 10-14 days.  We can follow-up with you after your colonoscopy.  Please call with any questions or concerns prior.  Ermalinda Memos, PA-C [facility]

## 2019-12-28 NOTE — Assessment & Plan Note (Addendum)
59 year old female presenting today to schedule colonoscopy.  She reports colonoscopy in 1990s due to rectal bleeding and rectal discomfort and she was found to have hemorrhoids.  More recently, she began experiencing hemorrhoid symptoms began in August 2021 with rectal burning, itching, and a single episode of rectal bleeding.  She is Preparation H and symptoms improved, but she continues to have rectal burning about once a week.  Denies constipation or any other significant upper or lower GI symptoms.  Rectal exam with small external hemorrhoid vs prolapsing internal hemorrhoid.  Internal hemorrhoid with limited due to rectal discomfort.  Plan: [facility] hemorrhoid cream compounded with lidocaine twice daily x10-14 days. Proceed with colonoscopy with propofol with Dr. Jena Gauss in the near future. The risks, benefits, and alternatives have been discussed with the patient in detail. The patient states understanding and desires to proceed.  ASA II Propofol due to medications. Follow-up after procedure.

## 2019-12-28 NOTE — Assessment & Plan Note (Signed)
Likely secondary to hemorrhoids.  Rectal exam with small external hemorrhoid versus prolapsed internal hemorrhoid.  Internal exam was limited due to rectal discomfort.  Plan: [facility] hemorrhoid cream compounded with lidocaine twice daily x10-14 days.

## 2019-12-31 ENCOUNTER — Telehealth: Payer: Self-pay | Admitting: *Deleted

## 2019-12-31 NOTE — Telephone Encounter (Signed)
LMOVM to call back to schedule TCS w/ propofol, Dr. Jena Gauss, ASA 2

## 2020-01-01 NOTE — Telephone Encounter (Signed)
LMOVM for pt 

## 2020-01-02 NOTE — Telephone Encounter (Signed)
Letter mailed to pt.  

## 2020-01-08 NOTE — Telephone Encounter (Signed)
LMOVM for pt 

## 2020-01-08 NOTE — Telephone Encounter (Signed)
Voice mail received. Pt called to r/s her procedure. [phone]

## 2020-01-09 NOTE — Telephone Encounter (Signed)
Called pt. She has been scheduled for 12/30 at 10:15am. Aware will mail prep instructions with covid test appt. Confirmed mailing address.

## 2020-01-09 NOTE — Telephone Encounter (Signed)
Called insurance. Was advised no PA is required for procedure. Call ref# [id number]

## 2020-01-14 ENCOUNTER — Other Ambulatory Visit (HOSPITAL_COMMUNITY): Payer: PRIVATE HEALTH INSURANCE

## 2020-01-22 ENCOUNTER — Telehealth: Payer: Self-pay | Admitting: Internal Medicine

## 2020-01-22 NOTE — Telephone Encounter (Signed)
Pt wants to reschedule her prescription on 12/30 with Dr Jena Gauss [phone]

## 2020-01-22 NOTE — Telephone Encounter (Signed)
Called pt to reschedule. She has been moved to 1/20 at 11:15am (per pt request refused early morning appt). Advised will mail new instructions with new covid test appt to her. Called endo and LMOVM to reschedule.

## 2020-02-04 ENCOUNTER — Inpatient Hospital Stay (HOSPITAL_COMMUNITY)
Admission: EM | Admit: 2020-02-04 | Discharge: 2020-03-21 | DRG: 177 | Disposition: A | Discharge status: Home Health | Payer: 59 | Attending: Family Medicine | Admitting: Family Medicine

## 2020-02-04 ENCOUNTER — Encounter (HOSPITAL_COMMUNITY): Payer: Self-pay | Admitting: Emergency Medicine

## 2020-02-04 ENCOUNTER — Other Ambulatory Visit: Payer: Self-pay

## 2020-02-04 ENCOUNTER — Emergency Department (HOSPITAL_COMMUNITY): Payer: 59

## 2020-02-04 DIAGNOSIS — J80 Acute respiratory distress syndrome: Secondary | ICD-10-CM | POA: Diagnosis present

## 2020-02-04 DIAGNOSIS — R7401 Elevation of levels of liver transaminase levels: Secondary | ICD-10-CM | POA: Diagnosis not present

## 2020-02-04 DIAGNOSIS — T380X5A Adverse effect of glucocorticoids and synthetic analogues, initial encounter: Secondary | ICD-10-CM | POA: Diagnosis not present

## 2020-02-04 DIAGNOSIS — S300XXA Contusion of lower back and pelvis, initial encounter: Secondary | ICD-10-CM | POA: Diagnosis not present

## 2020-02-04 DIAGNOSIS — E66811 Obesity, class 1: Secondary | ICD-10-CM

## 2020-02-04 DIAGNOSIS — I2699 Other pulmonary embolism without acute cor pulmonale: Secondary | ICD-10-CM

## 2020-02-04 DIAGNOSIS — F410 Panic disorder [episodic paroxysmal anxiety] without agoraphobia: Secondary | ICD-10-CM | POA: Diagnosis not present

## 2020-02-04 DIAGNOSIS — E872 Acidosis: Secondary | ICD-10-CM | POA: Diagnosis not present

## 2020-02-04 DIAGNOSIS — I10 Essential (primary) hypertension: Secondary | ICD-10-CM | POA: Diagnosis present

## 2020-02-04 DIAGNOSIS — U071 COVID-19: Secondary | ICD-10-CM | POA: Diagnosis not present

## 2020-02-04 DIAGNOSIS — Z6833 Body mass index (BMI) 33.0-33.9, adult: Secondary | ICD-10-CM

## 2020-02-04 DIAGNOSIS — J84178 Other interstitial pulmonary diseases with fibrosis in diseases classified elsewhere: Secondary | ICD-10-CM | POA: Diagnosis present

## 2020-02-04 DIAGNOSIS — F5104 Psychophysiologic insomnia: Secondary | ICD-10-CM | POA: Diagnosis present

## 2020-02-04 DIAGNOSIS — J15212 Pneumonia due to Methicillin resistant Staphylococcus aureus: Secondary | ICD-10-CM | POA: Diagnosis not present

## 2020-02-04 DIAGNOSIS — G9341 Metabolic encephalopathy: Secondary | ICD-10-CM | POA: Diagnosis not present

## 2020-02-04 DIAGNOSIS — R0600 Dyspnea, unspecified: Secondary | ICD-10-CM

## 2020-02-04 DIAGNOSIS — J9601 Acute respiratory failure with hypoxia: Secondary | ICD-10-CM

## 2020-02-04 DIAGNOSIS — F419 Anxiety disorder, unspecified: Secondary | ICD-10-CM | POA: Diagnosis present

## 2020-02-04 DIAGNOSIS — E876 Hypokalemia: Secondary | ICD-10-CM | POA: Diagnosis present

## 2020-02-04 DIAGNOSIS — J1282 Pneumonia due to coronavirus disease 2019: Secondary | ICD-10-CM | POA: Diagnosis present

## 2020-02-04 DIAGNOSIS — N281 Cyst of kidney, acquired: Secondary | ICD-10-CM | POA: Diagnosis present

## 2020-02-04 DIAGNOSIS — N3941 Urge incontinence: Secondary | ICD-10-CM | POA: Diagnosis present

## 2020-02-04 DIAGNOSIS — E038 Other specified hypothyroidism: Secondary | ICD-10-CM | POA: Diagnosis present

## 2020-02-04 DIAGNOSIS — Z4659 Encounter for fitting and adjustment of other gastrointestinal appliance and device: Secondary | ICD-10-CM

## 2020-02-04 DIAGNOSIS — J9602 Acute respiratory failure with hypercapnia: Secondary | ICD-10-CM | POA: Diagnosis not present

## 2020-02-04 DIAGNOSIS — Z87891 Personal history of nicotine dependence: Secondary | ICD-10-CM

## 2020-02-04 DIAGNOSIS — Z7984 Long term (current) use of oral hypoglycemic drugs: Secondary | ICD-10-CM

## 2020-02-04 DIAGNOSIS — J9690 Respiratory failure, unspecified, unspecified whether with hypoxia or hypercapnia: Secondary | ICD-10-CM

## 2020-02-04 DIAGNOSIS — R269 Unspecified abnormalities of gait and mobility: Secondary | ICD-10-CM | POA: Diagnosis not present

## 2020-02-04 DIAGNOSIS — T45515A Adverse effect of anticoagulants, initial encounter: Secondary | ICD-10-CM | POA: Diagnosis not present

## 2020-02-04 DIAGNOSIS — D62 Acute posthemorrhagic anemia: Secondary | ICD-10-CM | POA: Diagnosis not present

## 2020-02-04 DIAGNOSIS — K1379 Other lesions of oral mucosa: Secondary | ICD-10-CM | POA: Diagnosis not present

## 2020-02-04 DIAGNOSIS — F32A Depression, unspecified: Secondary | ICD-10-CM | POA: Diagnosis present

## 2020-02-04 DIAGNOSIS — K567 Ileus, unspecified: Secondary | ICD-10-CM

## 2020-02-04 DIAGNOSIS — E1165 Type 2 diabetes mellitus with hyperglycemia: Secondary | ICD-10-CM | POA: Diagnosis not present

## 2020-02-04 DIAGNOSIS — E785 Hyperlipidemia, unspecified: Secondary | ICD-10-CM | POA: Diagnosis present

## 2020-02-04 DIAGNOSIS — R442 Other hallucinations: Secondary | ICD-10-CM | POA: Diagnosis not present

## 2020-02-04 DIAGNOSIS — R140 Abdominal distension (gaseous): Secondary | ICD-10-CM

## 2020-02-04 DIAGNOSIS — E119 Type 2 diabetes mellitus without complications: Secondary | ICD-10-CM

## 2020-02-04 DIAGNOSIS — R109 Unspecified abdominal pain: Secondary | ICD-10-CM

## 2020-02-04 DIAGNOSIS — Z79899 Other long term (current) drug therapy: Secondary | ICD-10-CM

## 2020-02-04 DIAGNOSIS — E669 Obesity, unspecified: Secondary | ICD-10-CM | POA: Diagnosis present

## 2020-02-04 LAB — COMPREHENSIVE METABOLIC PANEL
ALT: 25 U/L (ref 0–44)
AST: 49 U/L — ABNORMAL HIGH (ref 15–41)
Albumin: 3.5 g/dL (ref 3.5–5.0)
Alkaline Phosphatase: 60 U/L (ref 38–126)
Anion gap: 10 (ref 5–15)
BUN: 12 mg/dL (ref 6–20)
CO2: 25 mmol/L (ref 22–32)
Calcium: 8.4 mg/dL — ABNORMAL LOW (ref 8.9–10.3)
Chloride: 99 mmol/L (ref 98–111)
Creatinine, Ser: 0.68 mg/dL (ref 0.44–1.00)
GFR, Estimated: >60 mL/min (ref >60)
Glucose, Bld: 162 mg/dL — ABNORMAL HIGH (ref 70–99)
Potassium: 3.2 mmol/L — ABNORMAL LOW (ref 3.5–5.1)
Sodium: 134 mmol/L — ABNORMAL LOW (ref 135–145)
Total Bilirubin: 0.7 mg/dL (ref 0.3–1.2)
Total Protein: 7.9 g/dL (ref 6.5–8.1)

## 2020-02-04 LAB — CBC WITH DIFFERENTIAL/PLATELET
Abs Immature Granulocytes: 0.03 10*3/uL (ref 0.00–0.07)
Basophils Absolute: 0 10*3/uL (ref 0.0–0.1)
Basophils Relative: 0 %
Eosinophils Absolute: 0 10*3/uL (ref 0.0–0.5)
Eosinophils Relative: 0 %
HCT: 36.9 % (ref 36.0–46.0)
Hemoglobin: 11.9 g/dL — ABNORMAL LOW (ref 12.0–15.0)
Immature Granulocytes: 1 %
Lymphocytes Relative: 13 %
Lymphs Abs: 0.8 10*3/uL (ref 0.7–4.0)
MCH: 28.4 pg (ref 26.0–34.0)
MCHC: 32.2 g/dL (ref 30.0–36.0)
MCV: 88.1 fL (ref 80.0–100.0)
Monocytes Absolute: 0.2 10*3/uL (ref 0.1–1.0)
Monocytes Relative: 3 %
Neutro Abs: 5.1 10*3/uL (ref 1.7–7.7)
Neutrophils Relative %: 83 %
Platelets: 275 10*3/uL (ref 150–400)
RBC: 4.19 MIL/uL (ref 3.87–5.11)
RDW: 13.6 % (ref 11.5–15.5)
WBC: 6.1 10*3/uL (ref 4.0–10.5)
nRBC: 0 % (ref 0.0–0.2)

## 2020-02-04 LAB — FIBRINOGEN: Fibrinogen: 756 mg/dL — ABNORMAL HIGH (ref 210–475)

## 2020-02-04 LAB — C-REACTIVE PROTEIN: CRP: 16.6 mg/dL — ABNORMAL HIGH (ref <1.0)

## 2020-02-04 LAB — LACTATE DEHYDROGENASE: LDH: 475 U/L — ABNORMAL HIGH (ref 98–192)

## 2020-02-04 LAB — RESP PANEL BY RT-PCR (FLU A&B, COVID) ARPGX2
Influenza A by PCR: NEGATIVE
Influenza B by PCR: NEGATIVE
SARS Coronavirus 2 by RT PCR: POSITIVE — AB

## 2020-02-04 LAB — D-DIMER, QUANTITATIVE: D-Dimer, Quant: 1.39 ug/mL-FEU — ABNORMAL HIGH (ref 0.00–0.50)

## 2020-02-04 LAB — POC URINE PREG, ED: Preg Test, Ur: NEGATIVE

## 2020-02-04 LAB — TRIGLYCERIDES: Triglycerides: 124 mg/dL (ref <150)

## 2020-02-04 LAB — LACTIC ACID, PLASMA: Lactic Acid, Venous: 1.2 mmol/L (ref 0.5–1.9)

## 2020-02-04 LAB — PROCALCITONIN: Procalcitonin: <0.1 ng/mL

## 2020-02-04 LAB — FERRITIN: Ferritin: 408 ng/mL — ABNORMAL HIGH (ref 11–307)

## 2020-02-04 MED ORDER — SODIUM CHLORIDE 0.9 % IV SOLN
100.0000 mg | Freq: Every day | INTRAVENOUS | Status: AC
  Administered 2020-02-05 – 2020-02-08 (×4): 100 mg via INTRAVENOUS
  Filled 2020-02-04 (×4): qty 20

## 2020-02-04 MED ORDER — POTASSIUM CHLORIDE CRYS ER 20 MEQ PO TBCR
40.0000 meq | EXTENDED_RELEASE_TABLET | Freq: Once | ORAL | Status: AC
  Administered 2020-02-05: 40 meq via ORAL
  Filled 2020-02-04: qty 2

## 2020-02-04 MED ORDER — DEXAMETHASONE SODIUM PHOSPHATE 10 MG/ML IJ SOLN
10.0000 mg | Freq: Once | INTRAMUSCULAR | Status: AC
  Administered 2020-02-04: 10 mg via INTRAVENOUS
  Filled 2020-02-04: qty 1

## 2020-02-04 MED ORDER — SODIUM CHLORIDE 0.9 % IV SOLN
100.0000 mg | INTRAVENOUS | Status: AC
  Administered 2020-02-05 (×2): 100 mg via INTRAVENOUS
  Filled 2020-02-04 (×2): qty 20

## 2020-02-04 MED ORDER — INSULIN ASPART 100 UNIT/ML ~~LOC~~ SOLN
0.0000 [IU] | Freq: Three times a day (TID) | SUBCUTANEOUS | Status: DC
  Administered 2020-02-05: 7 [IU] via SUBCUTANEOUS
  Administered 2020-02-05: 3 [IU] via SUBCUTANEOUS
  Administered 2020-02-06: 4 [IU] via SUBCUTANEOUS
  Administered 2020-02-07: 3 [IU] via SUBCUTANEOUS
  Administered 2020-02-07 – 2020-02-08 (×3): 4 [IU] via SUBCUTANEOUS
  Administered 2020-02-08: 3 [IU] via SUBCUTANEOUS
  Administered 2020-02-09: 15 [IU] via SUBCUTANEOUS
  Administered 2020-02-09: 7 [IU] via SUBCUTANEOUS
  Administered 2020-02-09: 4 [IU] via SUBCUTANEOUS
  Administered 2020-02-10: 11 [IU] via SUBCUTANEOUS
  Administered 2020-02-10: 7 [IU] via SUBCUTANEOUS
  Administered 2020-02-10: 11 [IU] via SUBCUTANEOUS
  Administered 2020-02-11: 4 [IU] via SUBCUTANEOUS
  Administered 2020-02-11: 7 [IU] via SUBCUTANEOUS
  Administered 2020-02-11 – 2020-02-12 (×3): 4 [IU] via SUBCUTANEOUS
  Administered 2020-02-12: 7 [IU] via SUBCUTANEOUS
  Administered 2020-02-13: 11 [IU] via SUBCUTANEOUS
  Administered 2020-02-13: 7 [IU] via SUBCUTANEOUS
  Administered 2020-02-13 – 2020-02-14 (×3): 4 [IU] via SUBCUTANEOUS
  Administered 2020-02-14: 11 [IU] via SUBCUTANEOUS
  Administered 2020-02-15: 3 [IU] via SUBCUTANEOUS
  Administered 2020-02-15: 4 [IU] via SUBCUTANEOUS
  Administered 2020-02-15 – 2020-02-16 (×2): 11 [IU] via SUBCUTANEOUS
  Administered 2020-02-16: 4 [IU] via SUBCUTANEOUS
  Administered 2020-02-16: 3 [IU] via SUBCUTANEOUS
  Administered 2020-02-17: 11 [IU] via SUBCUTANEOUS
  Administered 2020-02-18: 4 [IU] via SUBCUTANEOUS
  Administered 2020-02-18: 3 [IU] via SUBCUTANEOUS
  Administered 2020-02-18: 11 [IU] via SUBCUTANEOUS
  Administered 2020-02-19: 7 [IU] via SUBCUTANEOUS
  Administered 2020-02-19 – 2020-02-20 (×3): 3 [IU] via SUBCUTANEOUS
  Administered 2020-02-21: 7 [IU] via SUBCUTANEOUS
  Administered 2020-02-22 – 2020-02-23 (×3): 4 [IU] via SUBCUTANEOUS
  Administered 2020-02-23 (×2): 3 [IU] via SUBCUTANEOUS
  Administered 2020-02-24 (×2): 4 [IU] via SUBCUTANEOUS
  Administered 2020-02-24 – 2020-02-25 (×2): 3 [IU] via SUBCUTANEOUS
  Administered 2020-02-25: 4 [IU] via SUBCUTANEOUS
  Administered 2020-02-25: 3 [IU] via SUBCUTANEOUS
  Administered 2020-02-26: 4 [IU] via SUBCUTANEOUS
  Administered 2020-02-26: 3 [IU] via SUBCUTANEOUS
  Administered 2020-02-26: 4 [IU] via SUBCUTANEOUS
  Administered 2020-02-27 (×2): 3 [IU] via SUBCUTANEOUS
  Administered 2020-02-27: 4 [IU] via SUBCUTANEOUS
  Administered 2020-02-28: 1 [IU] via SUBCUTANEOUS
  Administered 2020-02-28: 4 [IU] via SUBCUTANEOUS
  Administered 2020-02-28: 1 [IU] via SUBCUTANEOUS
  Administered 2020-02-29: 4 [IU] via SUBCUTANEOUS
  Administered 2020-02-29 – 2020-03-01 (×5): 3 [IU] via SUBCUTANEOUS
  Administered 2020-03-02: 7 [IU] via SUBCUTANEOUS
  Administered 2020-03-02 – 2020-03-03 (×2): 4 [IU] via SUBCUTANEOUS
  Administered 2020-03-03: 7 [IU] via SUBCUTANEOUS
  Administered 2020-03-03 – 2020-03-04 (×3): 4 [IU] via SUBCUTANEOUS
  Administered 2020-03-04: 7 [IU] via SUBCUTANEOUS
  Administered 2020-03-05 (×2): 4 [IU] via SUBCUTANEOUS
  Administered 2020-03-05 – 2020-03-07 (×5): 7 [IU] via SUBCUTANEOUS
  Administered 2020-03-07: 11 [IU] via SUBCUTANEOUS
  Administered 2020-03-08: 7 [IU] via SUBCUTANEOUS
  Administered 2020-03-08: 4 [IU] via SUBCUTANEOUS
  Administered 2020-03-08: 7 [IU] via SUBCUTANEOUS
  Administered 2020-03-09: 4 [IU] via SUBCUTANEOUS
  Administered 2020-03-09: 11 [IU] via SUBCUTANEOUS
  Administered 2020-03-09: 7 [IU] via SUBCUTANEOUS
  Administered 2020-03-10: 15 [IU] via SUBCUTANEOUS
  Administered 2020-03-10: 4 [IU] via SUBCUTANEOUS
  Administered 2020-03-10: 11 [IU] via SUBCUTANEOUS
  Administered 2020-03-11: 4 [IU] via SUBCUTANEOUS
  Administered 2020-03-11: 7 [IU] via SUBCUTANEOUS
  Administered 2020-03-11: 20 [IU] via SUBCUTANEOUS
  Administered 2020-03-12: 15 [IU] via SUBCUTANEOUS
  Administered 2020-03-12: 4 [IU] via SUBCUTANEOUS
  Administered 2020-03-12: 11 [IU] via SUBCUTANEOUS
  Administered 2020-03-13 (×2): 15 [IU] via SUBCUTANEOUS
  Administered 2020-03-13: 7 [IU] via SUBCUTANEOUS
  Administered 2020-03-14: 20 [IU] via SUBCUTANEOUS
  Administered 2020-03-14: 3 [IU] via SUBCUTANEOUS
  Administered 2020-03-14: 4 [IU] via SUBCUTANEOUS
  Administered 2020-03-15: 11 [IU] via SUBCUTANEOUS
  Administered 2020-03-16 (×2): 3 [IU] via SUBCUTANEOUS
  Administered 2020-03-16: 7 [IU] via SUBCUTANEOUS
  Administered 2020-03-17: 11 [IU] via SUBCUTANEOUS
  Administered 2020-03-17: 3 [IU] via SUBCUTANEOUS
  Administered 2020-03-17: 11 [IU] via SUBCUTANEOUS
  Administered 2020-03-18: 4 [IU] via SUBCUTANEOUS
  Administered 2020-03-18: 11 [IU] via SUBCUTANEOUS
  Filled 2020-02-04 (×2): qty 1

## 2020-02-04 MED ORDER — INSULIN DETEMIR 100 UNIT/ML ~~LOC~~ SOLN
0.1500 [IU]/kg | Freq: Two times a day (BID) | SUBCUTANEOUS | Status: DC
  Administered 2020-02-05 – 2020-02-13 (×16): 15 [IU] via SUBCUTANEOUS
  Filled 2020-02-04 (×23): qty 0.15

## 2020-02-04 MED ORDER — ZINC SULFATE 220 (50 ZN) MG PO CAPS
220.0000 mg | ORAL_CAPSULE | Freq: Every day | ORAL | Status: DC
  Administered 2020-02-05 – 2020-03-21 (×46): 220 mg via ORAL
  Filled 2020-02-04 (×45): qty 1

## 2020-02-04 MED ORDER — DEXAMETHASONE SODIUM PHOSPHATE 10 MG/ML IJ SOLN
6.0000 mg | INTRAMUSCULAR | Status: DC
  Administered 2020-02-05 – 2020-02-07 (×3): 6 mg via INTRAVENOUS
  Filled 2020-02-04 (×3): qty 1

## 2020-02-04 MED ORDER — HYDROCOD POLST-CPM POLST ER 10-8 MG/5ML PO SUER
5.0000 mL | Freq: Two times a day (BID) | ORAL | Status: DC | PRN
  Administered 2020-02-05 – 2020-03-15 (×9): 5 mL via ORAL
  Filled 2020-02-04 (×10): qty 5

## 2020-02-04 MED ORDER — GUAIFENESIN-DM 100-10 MG/5ML PO SYRP
10.0000 mL | ORAL_SOLUTION | ORAL | Status: DC | PRN
  Administered 2020-02-05 – 2020-03-15 (×15): 10 mL via ORAL
  Filled 2020-02-04 (×17): qty 10

## 2020-02-04 MED ORDER — ASCORBIC ACID 500 MG PO TABS
500.0000 mg | ORAL_TABLET | Freq: Every day | ORAL | Status: DC
  Administered 2020-02-05 – 2020-03-21 (×46): 500 mg via ORAL
  Filled 2020-02-04 (×46): qty 1

## 2020-02-04 MED ORDER — ALBUTEROL SULFATE HFA 108 (90 BASE) MCG/ACT IN AERS
2.0000 | INHALATION_SPRAY | Freq: Four times a day (QID) | RESPIRATORY_TRACT | Status: DC
  Administered 2020-02-05 – 2020-02-08 (×16): 2 via RESPIRATORY_TRACT
  Filled 2020-02-04 (×3): qty 6.7

## 2020-02-04 MED ORDER — LINAGLIPTIN 5 MG PO TABS
5.0000 mg | ORAL_TABLET | Freq: Every day | ORAL | Status: DC
  Administered 2020-02-05 – 2020-02-21 (×17): 5 mg via ORAL
  Filled 2020-02-04 (×20): qty 1

## 2020-02-04 NOTE — ED Notes (Signed)
Date and time results received: 02/04/20 8:04 PM   Test: Covid Critical Value: Positive  Name of Provider Notified: Dr. Charm Barges  Orders Received? Or Actions Taken? See orders.

## 2020-02-04 NOTE — ED Triage Notes (Signed)
Pt c/o cough with shortness of breath since 01/29/11.  Pt states she has been running a fever at home with the highest at 102 oral.   Pt had Oxygen saturations of 83 on RA. Pt was place on 3 L [place] and oxygen saturations increased to 92.

## 2020-02-04 NOTE — H&P (Signed)
History and Physical    Christina Vincent GBT:[id number] DOB: Feb 27, 1960 DOA: 02/04/2020  PCP: Avon Gully, MD   Patient coming from: Home.  I have personally briefly reviewed patient's old medical records in [facility]  Chief Complaint: Shortness of breath.  HPI: Christina Vincent is a 59 y.o. female with medical history significant of depression, dyslipidemia, type 2 diabetes, class I obesity, hypertension who is coming to the emergency department due to progressively worse shortness of breath for the past 6 days associated with fever, chills, fatigue, malaise, sore throat, occasionally productive of whitish/clear dry cough and rhinorrhea.  She denies travel history or known Covid exposures.  She has not been vaccinated with any other COVID-19 vaccines.  On ROS, she also complains of dizziness and pleuritic chest pain, but denies palpitations, diaphoresis, PND, orthopnea or pitting edema of the lower extremities.  She denies abdominal pain, nausea, vomiting, diarrhea, constipation, melena or hematochezia.  No dysuria, frequency or hematuria.  No polyuria, polydipsia, polyphagia or blurred vision.  ED Course: Initial vital signs were temperature 99.2 F, pulse 106, respiration 18, blood pressure 122/72 mmHg O2 sat was 83% initially on room air and then 93% after being placed on 3 LPM via nasal cannula.  The patient was given dexamethasone and remdesivir in the emergency department.  Labwork: CBC showed a white count of 6.1 with 83% neutrophils, 13% lymphocytes.  Hemoglobin 11.9 g/dL and platelets 710.  D-dimer was 1.39 mcg/mL.  Fibrinogen was 756 mg/dL.  Lactic acid was normal x2.  CMP shows a sodium 134, potassium 3.2, chloride 99 and CO2 25 mmol/L.  Glucose 162 and calcium 8.4 mg/dL.  Renal function was normal.  AST was mildly increased at 49 units/L.  The rest of the hepatic functions are within expected range.  Ferritin is 408 ng/mL.  CRP was 16.6 mg/dL.  LDH was 475 units/L.  Procalcitonin  was normal.  Imaging: 1 view chest radiograph showed no acute cardiopulmonary pathology.  Please see image and full regular report for further detail.  Review of Systems: As per HPI otherwise all other systems reviewed and are negative.  Past Medical History:  Diagnosis Date  . Depression   . Dyslipidemia     Past Surgical History:  Procedure Laterality Date  . COLONOSCOPY     1990's; hemorrhoids   Social History  reports that she has quit smoking. She has never used smokeless tobacco. She reports current alcohol use. She reports that she does not use drugs.  No Known Allergies  Family History  Problem Relation Age of Onset  . Alcohol abuse Father   . Colon cancer Neg Hx    Prior to Admission medications   Medication Sig Start Date End Date Taking? Authorizing Provider  atorvastatin (LIPITOR) 10 MG tablet Take 10 mg by mouth daily. 11/16/19   [provider]  escitalopram (LEXAPRO) 20 MG tablet Take 1 tablet (20 mg total) by mouth daily. Patient taking differently: Take 20 mg by mouth as needed.  11/22/18   Neysa Hotter, MD  losartan (COZAAR) 50 MG tablet Take 50 mg by mouth daily. 09/02/19   [provider]  metFORMIN (GLUCOPHAGE) 850 MG tablet Take 850 mg by mouth 2 (two) times daily. 11/16/19   [provider]  mirtazapine (REMERON) 15 MG tablet Take 0.5 tablets (7.5 mg total) by mouth at bedtime. 11/22/18   Neysa Hotter, MD  Multiple Vitamin (MULTIVITAMIN ADULT PO) Take by mouth daily.    [provider]  VITAMIN  D PO Take by mouth every 30 (thirty) days.    [provider]   Physical Exam: Vitals:   02/04/20 2122 02/04/20 2130 02/04/20 2200 02/04/20 2230  BP: 119/70 111/67 138/82 114/83  Pulse: (!) 103 (!) 103 (!) 107 (!) 103  Resp: (!) 25 (!) 24 (!) 24 20  Temp:      TempSrc:      SpO2: 94% 93% 95% 95%  Weight:      Height:       Constitutional: Looks acutely ill.  NAD, calm, comfortable Eyes: PERRL, lids and  conjunctivae injected. ENMT: Mucous membranes are mildly dry.  Posterior pharynx clear of any exudate or lesions. Neck: normal, supple, no masses, no thyromegaly Respiratory: Decreased breath sounds on bases with bilateral scattered crackles. Normal respiratory effort. No accessory muscle use.  Cardiovascular: Tachycardic in the low 100s.  No murmurs / rubs / gallops. No extremity edema. 2+ pedal pulses. No carotid bruits.  Abdomen: Obese, nondistended.  Bowel sounds positive.  No tenderness, no masses palpated. No hepatosplenomegaly. Musculoskeletal: no clubbing / cyanosis.  Good ROM, no contractures. Normal muscle tone.  Skin: no rashes, lesions, ulcers on limited dermatological evaluation Neurologic: CN 2-12 grossly intact. Sensation intact, DTR normal. Strength 5/5 in all 4.  Psychiatric: Normal judgment and insight. Alert and oriented x 3. Normal mood.   Labs on Admission: I have personally reviewed following labs and imaging studies  CBC: Recent Labs  Lab 02/04/20 2132  WBC 6.1  NEUTROABS 5.1  HGB 11.9*  HCT 36.9  MCV 88.1  PLT 275    Basic Metabolic Panel: Recent Labs  Lab 02/04/20 2132  NA 134*  K 3.2*  CL 99  CO2 25  GLUCOSE 162*  BUN 12  CREATININE 0.68  CALCIUM 8.4*    GFR: Estimated Creatinine Clearance: 95.4 mL/min (by C-G formula based on SCr of 0.68 mg/dL).  Liver Function Tests: Recent Labs  Lab 02/04/20 2132  AST 49*  ALT 25  ALKPHOS 60  BILITOT 0.7  PROT 7.9  ALBUMIN 3.5    Urine analysis: No results found for: COLORURINE, APPEARANCEUR, LABSPEC, PHURINE, GLUCOSEU, HGBUR, BILIRUBINUR, KETONESUR, PROTEINUR, UROBILINOGEN, NITRITE, LEUKOCYTESUR  Radiological Exams on Admission: DG Chest Port 1 View  Result Date: 02/04/2020 CLINICAL DATA:  Cough, COVID EXAM: PORTABLE CHEST 1 VIEW COMPARISON:  03/10/2007 FINDINGS: The heart size and mediastinal contours are within normal limits. Both lungs are clear. The visualized skeletal structures are  unremarkable. IMPRESSION: No active disease. Electronically Signed   By: Jasmine Pang M.D.   On: 02/04/2020 21:39    EKG: Independently reviewed. Vent. rate 103 BPM PR interval * ms QRS duration 83 ms QT/QTc 358/469 ms P-R-T axes 60 35 201 Sinus tachycardia Abnormal T, consider ischemia, lateral leads  Assessment/Plan Principal Problem:   2019 novel coronavirus disease (COVID-19) Observation/telemetry. Continue supplemental oxygen. Flutter valve use. Incentive spirometry. Bronchodilators PRN. Analgesics as needed. Antitussives as needed. Dexamethasone 6 mg IVP every 24 hours. Remdesivir per pharmacy. Follow-up CBC, CMP and inflammatory markers.  Active Problems:   Hypertension Resume losartan 50 mg p.o. daily. Monitor BP, renal function electrolytes.    Type 2 diabetes mellitus (HCC) Carbohydrate modified diet. Continue Metformin 850 mg p.o. twice daily. Add Tradjenta while on glucocorticoids Levemir 15 units SQ twice daily. CBG monitoring with RI SS.    Dyslipidemia Minimal transaminitis. Follow-up LFTs. We will not resume atorvastatin yet.    Depression Resume escitalopram or mirtazapine. Follow-up with PCP.    Class 1 obesity  Lifestyle modifications. Follow-up with PCP.   DVT prophylaxis: Lovenox SQ. Code Status:   Full code. Family Communication: Disposition Plan:   Patient is from:  Home.  Anticipated DC to:  Home.  Anticipated DC date:  02/05/2020.  Anticipated DC barriers: Clinical status.  Consults called: Admission status:  Observation/telemetry.   Severity of Illness: High due to presenting with hypoxia on room air after several days of COVID-19 symptoms with a new oxygen requirement of at least 3 LPM via [place].  The patient will need to remain in the hospital for oxygen supplementation and COVID-19 treatment.  Bobette Mo MD [facility]  How to contact the [facility] Attending or Consulting provider 7A - 7P or covering provider  during after hours 7P -7A, for this patient?   1. Check the care team in [facility] and look for a) attending/consulting [facility] provider listed and b) the [facility] team listed 2. Log into [email or web address] and use 's universal password to access. If you do not have the password, please contact the hospital operator. 3. Locate the [facility] provider you are looking for under [facility] and page to a number that you can be directly reached. 4. If you still have difficulty reaching the provider, please page the [facility] (Director on Call) for the Hospitalists listed on amion for assistance.  02/04/2020, 11:37 PM   This document was prepared using Dragon voice recognition software and may contain some unintended transcription errors.

## 2020-02-04 NOTE — ED Notes (Signed)
Pt was 97% on 5L. O2 was turned down to 4L [place]. O2 is 95%. Pt appears to be in NAD and states that she's feeling much better. Will continue to monitor.

## 2020-02-04 NOTE — ED Provider Notes (Signed)
[facility] EMERGENCY DEPARTMENT Provider Note   CSN: [id number] Arrival date & time: 02/04/20  1702     History Chief Complaint  Patient presents with  . Shortness of Breath    Christina Vincent is a 59 y.o. female.  HPI      Christina Vincent is a 59 y.o. female with history of HTN, type 2 diabetes, who presents to the Emergency Department complaining of fever, cough and increasing shortness of breath.  Symptoms began on 01/29/20 with cough and fever.  Mild rhinorrhea.  Tmax at home of 102.  Cough described as mostly non productive with occasional clear sputum production.  She denies abdominal pain, diarrhea, vomiting and chest pain.  No known covid exposures.  She has not been vaccinated for covid.  Upon arrival, pt was noted to have an oxygen saturation of 83% on room air.  She was placed on 3L and oxygen improved to 92%    Past Medical History:  Diagnosis Date  . Depression   . Diabetes (HCC)   . Dyslipidemia   . Hypertension     Patient Active Problem List   Diagnosis Date Noted  . Rectal burning 12/28/2019  . Rectal bleeding 12/28/2019  . Colon cancer screening 12/28/2019    Past Surgical History:  Procedure Laterality Date  . COLONOSCOPY     1990's; hemorrhoids     OB History   No obstetric history on file.     Family History  Problem Relation Age of Onset  . Alcohol abuse Father   . Colon cancer Neg Hx     Social History   Tobacco Use  . Smoking status: Former Games developer  . Smokeless tobacco: Never Used  . Tobacco comment: quit 1988  Vaping Use  . Vaping Use: Never used  Substance Use Topics  . Alcohol use: Yes    Comment: occ glass of wine  . Drug use: Never    Home Medications Prior to Admission medications   Medication Sig Start Date End Date Taking? Authorizing Provider  atorvastatin (LIPITOR) 10 MG tablet Take 10 mg by mouth daily. 11/16/19   [provider]  escitalopram (LEXAPRO) 20 MG tablet Take 1 tablet (20 mg total) by  mouth daily. Patient taking differently: Take 20 mg by mouth as needed.  11/22/18   Neysa Hotter, MD  losartan (COZAAR) 50 MG tablet Take 50 mg by mouth daily. 09/02/19   [provider]  metFORMIN (GLUCOPHAGE) 850 MG tablet Take 850 mg by mouth 2 (two) times daily. 11/16/19   [provider]  mirtazapine (REMERON) 15 MG tablet Take 0.5 tablets (7.5 mg total) by mouth at bedtime. 11/22/18   Neysa Hotter, MD  Multiple Vitamin (MULTIVITAMIN ADULT PO) Take by mouth daily.    [provider]  VITAMIN D PO Take by mouth every 30 (thirty) days.    [provider]    Allergies    Patient has no known allergies.  Review of Systems   Review of Systems  Constitutional: Positive for fever. Negative for appetite change, chills and fatigue.  HENT: Positive for rhinorrhea. Negative for congestion, sore throat and trouble swallowing.   Respiratory: Positive for cough and shortness of breath. Negative for wheezing.   Cardiovascular: Negative for chest pain, palpitations and leg swelling.  Gastrointestinal: Negative for abdominal pain, diarrhea, nausea and vomiting.  Genitourinary: Negative for dysuria, flank pain and hematuria.  Musculoskeletal: Negative for arthralgias, back pain, myalgias, neck pain and neck stiffness.  Skin: Negative  for rash.  Neurological: Negative for dizziness, syncope, weakness, numbness and headaches.  Hematological: Does not bruise/bleed easily.  Psychiatric/Behavioral: Negative for confusion.    Physical Exam Updated Vital Signs BP 122/72 (BP Location: Right Arm)   Pulse (!) 106   Temp 99.2 F (37.3 C) (Oral)   Resp 18   Ht 5\' 8"  (1.727 m)   Wt 98.9 kg   SpO2 93%   BMI 33.15 kg/m   Physical Exam Vitals and nursing note reviewed.  Constitutional:      Appearance: She is well-developed. She is not toxic-appearing.  HENT:     Head: Atraumatic.  Cardiovascular:     Rate and Rhythm: Normal rate and regular rhythm.     Pulses:  Normal pulses.  Pulmonary:     Effort: Pulmonary effort is normal.     Breath sounds: Normal breath sounds. No wheezing or rhonchi.     Comments: Lungs clear, no increased work of breathing Abdominal:     General: There is no distension.     Palpations: Abdomen is soft.     Tenderness: There is no abdominal tenderness.  Musculoskeletal:        General: Normal range of motion.     Right lower leg: No edema.     Left lower leg: No edema.  Lymphadenopathy:     Cervical: No cervical adenopathy.  Skin:    General: Skin is warm.     Capillary Refill: Capillary refill takes less than 2 seconds.     Findings: No rash.  Neurological:     General: No focal deficit present.     Mental Status: She is alert.     Sensory: No sensory deficit.     Motor: No weakness.  Psychiatric:        Mood and Affect: Mood normal.     ED Results / Procedures / Treatments   Labs (all labs ordered are listed, but only abnormal results are displayed) Labs Reviewed  RESP PANEL BY RT-PCR (FLU A&B, COVID) ARPGX2 - Abnormal; Notable for the following components:      Result Value   SARS Coronavirus 2 by RT PCR POSITIVE (*)    All other components within normal limits  CBC WITH DIFFERENTIAL/PLATELET - Abnormal; Notable for the following components:   Hemoglobin 11.9 (*)    All other components within normal limits  COMPREHENSIVE METABOLIC PANEL - Abnormal; Notable for the following components:   Sodium 134 (*)    Potassium 3.2 (*)    Glucose, Bld 162 (*)    Calcium 8.4 (*)    AST 49 (*)    All other components within normal limits  D-DIMER, QUANTITATIVE (NOT AT [facility]) - Abnormal; Notable for the following components:   D-Dimer, Quant 1.39 (*)    All other components within normal limits  LACTATE DEHYDROGENASE - Abnormal; Notable for the following components:   LDH 475 (*)    All other components within normal limits  FERRITIN - Abnormal; Notable for the following components:   Ferritin 408 (*)    All  other components within normal limits  FIBRINOGEN - Abnormal; Notable for the following components:   Fibrinogen 756 (*)    All other components within normal limits  C-REACTIVE PROTEIN - Abnormal; Notable for the following components:   CRP 16.6 (*)    All other components within normal limits  CULTURE, BLOOD (ROUTINE X 2)  CULTURE, BLOOD (ROUTINE X 2)  LACTIC ACID, PLASMA  PROCALCITONIN  TRIGLYCERIDES  LACTIC ACID, PLASMA  POC URINE PREG, ED    EKG None  Radiology DG Chest Port 1 View  Result Date: 02/04/2020 CLINICAL DATA:  Cough, COVID EXAM: PORTABLE CHEST 1 VIEW COMPARISON:  03/10/2007 FINDINGS: The heart size and mediastinal contours are within normal limits. Both lungs are clear. The visualized skeletal structures are unremarkable. IMPRESSION: No active disease. Electronically Signed   By: Jasmine Pang M.D.   On: 02/04/2020 21:39    Procedures Procedures (including critical care time)  Medications Ordered in ED Medications  remdesivir 100 mg in sodium chloride 0.9 % 100 mL IVPB (has no administration in time range)  remdesivir 100 mg in sodium chloride 0.9 % 100 mL IVPB (has no administration in time range)  dexamethasone (DECADRON) injection 10 mg (10 mg Intravenous Given 02/04/20 2148)    ED Course  I have reviewed the triage vital signs and the nursing notes.  Pertinent labs & imaging results that were available during my care of the patient were reviewed by me and considered in my medical decision making (see chart for details).  Clinical Course as of 02/04/20 2310  Mon Feb 04, 2020  1563 59 year old female unvaccinated here with fever and shortness of breath.  Sats were 83% on room air.  Currently now on nasal cannula.  Covid test is positive.  She will need to be admitted for IV steroids and remdesivir. [MB]    Clinical Course User Index [MB] Terrilee Files, MD    CRITICAL CARE Performed by: Takumi Din Total critical care time: 35  minutes Critical care time was exclusive of separately billable procedures and treating other patients. Critical care was necessary to treat or prevent imminent or life-threatening deterioration. Critical care was time spent personally by me on the following activities: development of treatment plan with patient and/or surrogate as well as nursing, discussions with consultants, evaluation of patient's response to treatment, examination of patient, obtaining history from patient or surrogate, ordering and performing treatments and interventions, ordering and review of laboratory studies, ordering and review of radiographic studies, pulse oximetry and re-evaluation of patient's condition.    MDM Rules/Calculators/A&P                          Pt here with sx's suggestive of covid.  Symptoms present for 6 days.  She has not been vaccinated.  Hypoxic on arrival with sats in low 80's, improved on 3L to low 90's.  Oxygen later removed and sats dropped again into the lower 80's.  Lungs clear on exam.  She will need admission for her acute respiratory failure which is likely related to covid.    CXR w/o evidence for PNA, mild hypokalemia, no significant leukocytosis. Steroid given and remdesivir ordered per pharmacy consult.    Consulted triad hospitalist, Dr. Robb Matar who agrees to admit.       Final Clinical Impression(s) / ED Diagnoses Final diagnoses:  Acute hypoxemic respiratory failure due to COVID-19 [facility])    Rx / DC Orders ED Discharge Orders    None       Pauline Aus, PA-C 02/04/20 2318    Terrilee Files, MD 02/05/20 1007

## 2020-02-05 ENCOUNTER — Other Ambulatory Visit (HOSPITAL_COMMUNITY): Payer: PRIVATE HEALTH INSURANCE

## 2020-02-05 DIAGNOSIS — J80 Acute respiratory distress syndrome: Secondary | ICD-10-CM | POA: Diagnosis present

## 2020-02-05 DIAGNOSIS — R7989 Other specified abnormal findings of blood chemistry: Secondary | ICD-10-CM | POA: Diagnosis not present

## 2020-02-05 DIAGNOSIS — J1282 Pneumonia due to coronavirus disease 2019: Secondary | ICD-10-CM | POA: Diagnosis present

## 2020-02-05 DIAGNOSIS — J9602 Acute respiratory failure with hypercapnia: Secondary | ICD-10-CM | POA: Diagnosis not present

## 2020-02-05 DIAGNOSIS — J9601 Acute respiratory failure with hypoxia: Secondary | ICD-10-CM | POA: Diagnosis not present

## 2020-02-05 DIAGNOSIS — E872 Acidosis: Secondary | ICD-10-CM | POA: Diagnosis not present

## 2020-02-05 DIAGNOSIS — F329 Major depressive disorder, single episode, unspecified: Secondary | ICD-10-CM | POA: Diagnosis not present

## 2020-02-05 DIAGNOSIS — R140 Abdominal distension (gaseous): Secondary | ICD-10-CM | POA: Diagnosis not present

## 2020-02-05 DIAGNOSIS — R269 Unspecified abnormalities of gait and mobility: Secondary | ICD-10-CM | POA: Diagnosis not present

## 2020-02-05 DIAGNOSIS — J84178 Other interstitial pulmonary diseases with fibrosis in diseases classified elsewhere: Secondary | ICD-10-CM | POA: Diagnosis present

## 2020-02-05 DIAGNOSIS — F32A Depression, unspecified: Secondary | ICD-10-CM | POA: Diagnosis not present

## 2020-02-05 DIAGNOSIS — E1165 Type 2 diabetes mellitus with hyperglycemia: Secondary | ICD-10-CM | POA: Diagnosis not present

## 2020-02-05 DIAGNOSIS — K567 Ileus, unspecified: Secondary | ICD-10-CM | POA: Diagnosis not present

## 2020-02-05 DIAGNOSIS — E876 Hypokalemia: Secondary | ICD-10-CM | POA: Diagnosis present

## 2020-02-05 DIAGNOSIS — G9341 Metabolic encephalopathy: Secondary | ICD-10-CM | POA: Diagnosis not present

## 2020-02-05 DIAGNOSIS — U071 COVID-19: Secondary | ICD-10-CM | POA: Diagnosis present

## 2020-02-05 DIAGNOSIS — T148XXA Other injury of unspecified body region, initial encounter: Secondary | ICD-10-CM | POA: Diagnosis not present

## 2020-02-05 DIAGNOSIS — K1379 Other lesions of oral mucosa: Secondary | ICD-10-CM | POA: Diagnosis not present

## 2020-02-05 DIAGNOSIS — E669 Obesity, unspecified: Secondary | ICD-10-CM | POA: Diagnosis not present

## 2020-02-05 DIAGNOSIS — E038 Other specified hypothyroidism: Secondary | ICD-10-CM | POA: Diagnosis present

## 2020-02-05 DIAGNOSIS — N3941 Urge incontinence: Secondary | ICD-10-CM | POA: Diagnosis present

## 2020-02-05 DIAGNOSIS — I10 Essential (primary) hypertension: Secondary | ICD-10-CM | POA: Diagnosis present

## 2020-02-05 DIAGNOSIS — F410 Panic disorder [episodic paroxysmal anxiety] without agoraphobia: Secondary | ICD-10-CM | POA: Diagnosis not present

## 2020-02-05 DIAGNOSIS — I2699 Other pulmonary embolism without acute cor pulmonale: Secondary | ICD-10-CM | POA: Diagnosis present

## 2020-02-05 DIAGNOSIS — S300XXA Contusion of lower back and pelvis, initial encounter: Secondary | ICD-10-CM | POA: Diagnosis not present

## 2020-02-05 DIAGNOSIS — E785 Hyperlipidemia, unspecified: Secondary | ICD-10-CM | POA: Diagnosis present

## 2020-02-05 DIAGNOSIS — N281 Cyst of kidney, acquired: Secondary | ICD-10-CM | POA: Diagnosis present

## 2020-02-05 DIAGNOSIS — R442 Other hallucinations: Secondary | ICD-10-CM | POA: Diagnosis not present

## 2020-02-05 DIAGNOSIS — J15212 Pneumonia due to Methicillin resistant Staphylococcus aureus: Secondary | ICD-10-CM | POA: Diagnosis not present

## 2020-02-05 DIAGNOSIS — D62 Acute posthemorrhagic anemia: Secondary | ICD-10-CM | POA: Diagnosis not present

## 2020-02-05 LAB — COMPREHENSIVE METABOLIC PANEL
ALT: 26 U/L (ref 0–44)
AST: 49 U/L — ABNORMAL HIGH (ref 15–41)
Albumin: 3.3 g/dL — ABNORMAL LOW (ref 3.5–5.0)
Alkaline Phosphatase: 59 U/L (ref 38–126)
Anion gap: 12 (ref 5–15)
BUN: 9 mg/dL (ref 6–20)
CO2: 24 mmol/L (ref 22–32)
Calcium: 8.4 mg/dL — ABNORMAL LOW (ref 8.9–10.3)
Chloride: 100 mmol/L (ref 98–111)
Creatinine, Ser: 0.63 mg/dL (ref 0.44–1.00)
GFR, Estimated: >60 mL/min (ref >60)
Glucose, Bld: 250 mg/dL — ABNORMAL HIGH (ref 70–99)
Potassium: 3.8 mmol/L (ref 3.5–5.1)
Sodium: 136 mmol/L (ref 135–145)
Total Bilirubin: 0.5 mg/dL (ref 0.3–1.2)
Total Protein: 7.7 g/dL (ref 6.5–8.1)

## 2020-02-05 LAB — CBC WITH DIFFERENTIAL/PLATELET
Abs Immature Granulocytes: 0.03 10*3/uL (ref 0.00–0.07)
Basophils Absolute: 0 10*3/uL (ref 0.0–0.1)
Basophils Relative: 0 %
Eosinophils Absolute: 0 10*3/uL (ref 0.0–0.5)
Eosinophils Relative: 0 %
HCT: 34.6 % — ABNORMAL LOW (ref 36.0–46.0)
Hemoglobin: 11.3 g/dL — ABNORMAL LOW (ref 12.0–15.0)
Immature Granulocytes: 0 %
Lymphocytes Relative: 8 %
Lymphs Abs: 0.5 10*3/uL — ABNORMAL LOW (ref 0.7–4.0)
MCH: 28.8 pg (ref 26.0–34.0)
MCHC: 32.7 g/dL (ref 30.0–36.0)
MCV: 88 fL (ref 80.0–100.0)
Monocytes Absolute: 0.1 10*3/uL (ref 0.1–1.0)
Monocytes Relative: 2 %
Neutro Abs: 6 10*3/uL (ref 1.7–7.7)
Neutrophils Relative %: 90 %
Platelets: 280 10*3/uL (ref 150–400)
RBC: 3.93 MIL/uL (ref 3.87–5.11)
RDW: 13.6 % (ref 11.5–15.5)
WBC: 6.7 10*3/uL (ref 4.0–10.5)
nRBC: 0 % (ref 0.0–0.2)

## 2020-02-05 LAB — MAGNESIUM: Magnesium: 1.7 mg/dL (ref 1.7–2.4)

## 2020-02-05 LAB — CBG MONITORING, ED
Glucose-Capillary: 112 mg/dL — ABNORMAL HIGH (ref 70–99)
Glucose-Capillary: 150 mg/dL — ABNORMAL HIGH (ref 70–99)
Glucose-Capillary: 180 mg/dL — ABNORMAL HIGH (ref 70–99)
Glucose-Capillary: 223 mg/dL — ABNORMAL HIGH (ref 70–99)
Glucose-Capillary: 245 mg/dL — ABNORMAL HIGH (ref 70–99)

## 2020-02-05 LAB — PHOSPHORUS: Phosphorus: 3.2 mg/dL (ref 2.5–4.6)

## 2020-02-05 LAB — FERRITIN: Ferritin: 421 ng/mL — ABNORMAL HIGH (ref 11–307)

## 2020-02-05 LAB — LACTIC ACID, PLASMA: Lactic Acid, Venous: 1 mmol/L (ref 0.5–1.9)

## 2020-02-05 LAB — D-DIMER, QUANTITATIVE: D-Dimer, Quant: 1.42 ug/mL-FEU — ABNORMAL HIGH (ref 0.00–0.50)

## 2020-02-05 LAB — C-REACTIVE PROTEIN: CRP: 18.2 mg/dL — ABNORMAL HIGH (ref <1.0)

## 2020-02-05 MED ORDER — METFORMIN HCL 850 MG PO TABS
850.0000 mg | ORAL_TABLET | Freq: Two times a day (BID) | ORAL | Status: DC
Start: 2020-02-05 — End: 2020-02-06
  Administered 2020-02-05 (×2): 850 mg via ORAL
  Filled 2020-02-05 (×7): qty 1

## 2020-02-05 MED ORDER — ENOXAPARIN SODIUM 40 MG/0.4ML ~~LOC~~ SOLN
40.0000 mg | SUBCUTANEOUS | Status: DC
  Administered 2020-02-05 – 2020-02-07 (×3): 40 mg via SUBCUTANEOUS
  Filled 2020-02-05 (×3): qty 0.4

## 2020-02-05 MED ORDER — LOSARTAN POTASSIUM 50 MG PO TABS
50.0000 mg | ORAL_TABLET | Freq: Every day | ORAL | Status: DC
  Administered 2020-02-05 – 2020-02-08 (×4): 50 mg via ORAL
  Filled 2020-02-05: qty 1
  Filled 2020-02-05: qty 2
  Filled 2020-02-05 (×2): qty 1

## 2020-02-05 MED ORDER — ACETAMINOPHEN 325 MG PO TABS
650.0000 mg | ORAL_TABLET | Freq: Four times a day (QID) | ORAL | Status: DC | PRN
  Administered 2020-02-05 – 2020-03-18 (×16): 650 mg via ORAL
  Filled 2020-02-05 (×17): qty 2

## 2020-02-05 MED ORDER — MIRTAZAPINE 15 MG PO TABS
7.5000 mg | ORAL_TABLET | Freq: Every day | ORAL | Status: DC
  Administered 2020-02-05 – 2020-02-28 (×24): 7.5 mg via ORAL
  Filled 2020-02-05 (×24): qty 1

## 2020-02-05 NOTE — Progress Notes (Signed)
PROGRESS NOTE    BETHANIE BLOXOM  WNI:[id number] DOB: 12-Oct-1960 DOA: 02/04/2020 PCP: Avon Gully, MD   Brief Narrative:  Christina Vincent is a 59 y.o. female with medical history significant of depression, dyslipidemia, type 2 diabetes, class I obesity, hypertension who is coming to the emergency department due to progressively worse shortness of breath for the past 6 days associated with fever, chills, fatigue, malaise, sore throat, occasionally productive of whitish/clear dry cough and rhinorrhea.  Patient is unvaccinated and has been admitted for acute hypoxemic respiratory failure secondary to COVID-19 and is currently on 6 L nasal cannula oxygen.  Assessment & Plan:   Principal Problem:   2019 novel coronavirus disease (COVID-19) Active Problems:   Hypertension   Type 2 diabetes mellitus (HCC)   Dyslipidemia   Depression   Class 1 obesity   2019 novel coronavirus disease (COVID-19) Continue supplemental oxygen and wean as tolerated Flutter valve use. Incentive spirometry. Bronchodilators PRN. Analgesics as needed. Antitussives as needed. Dexamethasone 6 mg IVP every 24 hours. Remdesivir per pharmacy. Follow-up CBC, CMP and inflammatory markers  Active Problems:   Hypertension Continue losartan 50 mg p.o. daily. Monitor BP, renal function electrolytes.    Type 2 diabetes mellitus (HCC) with hyperglycemia Carbohydrate modified diet. Continue Metformin 850 mg p.o. twice daily. Add Tradjenta while on glucocorticoids Levemir 15 units SQ twice daily. CBG monitoring with RI SS.    Dyslipidemia Minimal transaminitis-stable Follow-up LFTs. We will not resume atorvastatin yet.    Depression Continue home medication Follow-up with PCP.    Class 1 obesity Lifestyle modifications. Follow-up with PCP.   DVT prophylaxis:Lovenox Code Status: Full Family Communication: Discussed with husband on phone 12/28 Disposition Plan:  Status is: Observation  The  patient will require care spanning > 2 midnights and should be moved to inpatient because: Inpatient level of care appropriate due to severity of illness  Dispo: The patient is from: Home              Anticipated d/c is to: Home              Anticipated d/c date is: 3 days              Patient currently is not medically stable to d/c.  Patient requires IV treatments for Covid pneumonia.   Consultants:   None  Procedures:   None  Antimicrobials:  Anti-infectives (From admission, onward)   Start     Dose/Rate Route Frequency Ordered Stop   02/05/20 1600  remdesivir 100 mg in sodium chloride 0.9 % 100 mL IVPB        100 mg 200 mL/hr over 30 Minutes Intravenous Daily 02/04/20 2245 02/09/20 0959   02/04/20 2300  remdesivir 100 mg in sodium chloride 0.9 % 100 mL IVPB        100 mg 200 mL/hr over 30 Minutes Intravenous Every 30 min 02/04/20 2245 02/05/20 0129       Subjective: Patient seen and evaluated today with no new acute complaints or concerns. No acute concerns or events noted overnight.  She denies any significant shortness of breath or chest pain.  She remains on 6 L nasal cannula oxygen.  Objective: Vitals:   02/05/20 1015 02/05/20 1030 02/05/20 1045 02/05/20 1053  BP:      Pulse: 90 88 88 87  Resp: (!) 31 (!) 41 (!) 27 (!) 31  Temp:      TempSrc:      SpO2: 93% 94% 95% 98%  Weight:      Height:       No intake or output data in the 24 hours ending 02/05/20 1115 Filed Weights   02/04/20 1838  Weight: 98.9 kg    Examination:  General exam: Appears calm and comfortable  Respiratory system: Clear to auscultation. Respiratory effort normal.  Currently on 6 L nasal cannula oxygen. Cardiovascular system: S1 & S2 heard, RRR.  Gastrointestinal system: Abdomen is soft Central nervous system: Alert and awake Extremities: No edema Skin: No significant lesions noted Psychiatry: Flat affect.    Data Reviewed: I have personally reviewed following labs and imaging  studies  CBC: Recent Labs  Lab 02/04/20 2132 02/05/20 0348  WBC 6.1 6.7  NEUTROABS 5.1 6.0  HGB 11.9* 11.3*  HCT 36.9 34.6*  MCV 88.1 88.0  PLT 275 280   Basic Metabolic Panel: Recent Labs  Lab 02/04/20 2132 02/05/20 0348  NA 134* 136  K 3.2* 3.8  CL 99 100  CO2 25 24  GLUCOSE 162* 250*  BUN 12 9  CREATININE 0.68 0.63  CALCIUM 8.4* 8.4*  MG 1.7  --   PHOS 3.2  --    GFR: Estimated Creatinine Clearance: 95.4 mL/min (by C-G formula based on SCr of 0.63 mg/dL). Liver Function Tests: Recent Labs  Lab 02/04/20 2132 02/05/20 0348  AST 49* 49*  ALT 25 26  ALKPHOS 60 59  BILITOT 0.7 0.5  PROT 7.9 7.7  ALBUMIN 3.5 3.3*   No results for input(s): LIPASE, AMYLASE in the last 168 hours. No results for input(s): AMMONIA in the last 168 hours. Coagulation Profile: No results for input(s): INR, PROTIME in the last 168 hours. Cardiac Enzymes: No results for input(s): CKTOTAL, CKMB, CKMBINDEX, TROPONINI in the last 168 hours. BNP (last 3 results) No results for input(s): PROBNP in the last 8760 hours. HbA1C: No results for input(s): HGBA1C in the last 72 hours. CBG: Recent Labs  Lab 02/05/20 0009 02/05/20 0341 02/05/20 0837  GLUCAP 180* 245* 223*   Lipid Profile: Recent Labs    02/04/20 2132  TRIG 124   Thyroid Function Tests: No results for input(s): TSH, T4TOTAL, FREET4, T3FREE, THYROIDAB in the last 72 hours. Anemia Panel: Recent Labs    02/04/20 2132  FERRITIN 408*   Sepsis Labs: Recent Labs  Lab 02/04/20 2132 02/05/20 0218  PROCALCITON <0.10  --   LATICACIDVEN 1.2 1.0    Recent Results (from the past 240 hour(s))  Resp Panel by RT-PCR (Flu A&B, Covid) Nasopharyngeal Swab     Status: Abnormal   Collection Time: 02/04/20  6:40 PM   Specimen: Nasopharyngeal Swab; Nasopharyngeal(NP) swabs in vial transport medium  Result Value Ref Range Status   SARS Coronavirus 2 by RT PCR POSITIVE (A) NEGATIVE Final    Comment: RESULT CALLED TO, READ BACK  BY AND VERIFIED WITH: OAKLEY,B AT 1958 ON 02/04/2020 BY MOSLEY,J (NOTE) SARS-CoV-2 target nucleic acids are DETECTED.  The SARS-CoV-2 RNA is generally detectable in upper respiratory specimens during the acute phase of infection. Positive results are indicative of the presence of the identified virus, but do not rule out bacterial infection or co-infection with other pathogens not detected by the test. Clinical correlation with patient history and other diagnostic information is necessary to determine patient infection status. The expected result is Negative.  Fact Sheet for Patients: BloggerCourse.comhttps://www.fda.gov/media/152166/download  Fact Sheet for Healthcare Providers: SeriousBroker.ithttps://www.fda.gov/media/152162/download  This test is not yet approved or cleared by the [place] FDA and  has been authorized for  detection and/or diagnosis of SARS-CoV-2 by FDA under an Emergency Use Authorization (EUA).  This EUA will remain in effect (meaning this te st can be used) for the duration of  the COVID-19 declaration under Section 564(b)(1) of the Act, 21 U.S.C. section 360bbb-3(b)(1), unless the authorization is terminated or revoked sooner.     Influenza A by PCR NEGATIVE NEGATIVE Final   Influenza B by PCR NEGATIVE NEGATIVE Final    Comment: (NOTE) The Xpert Xpress SARS-CoV-2/FLU/RSV plus assay is intended as an aid in the diagnosis of influenza from Nasopharyngeal swab specimens and should not be used as a sole basis for treatment. Nasal washings and aspirates are unacceptable for Xpert Xpress SARS-CoV-2/FLU/RSV testing.  Fact Sheet for Patients: BloggerCourse.com  Fact Sheet for Healthcare Providers: SeriousBroker.it  This test is not yet approved or cleared by the [place] FDA and has been authorized for detection and/or diagnosis of SARS-CoV-2 by FDA under an Emergency Use Authorization (EUA). This EUA will remain in effect  (meaning this test can be used) for the duration of the COVID-19 declaration under Section 564(b)(1) of the Act, 21 U.S.C. section 360bbb-3(b)(1), unless the authorization is terminated or revoked.  Performed at [facility], [address], [place], [place] [address]   Blood Culture (routine x 2)     Status: None (Preliminary result)   Collection Time: 02/04/20  9:32 PM   Specimen: BLOOD RIGHT ARM  Result Value Ref Range Status   Specimen Description BLOOD RIGHT ARM  Final   Special Requests   Final    BOTTLES DRAWN AEROBIC AND ANAEROBIC Blood Culture adequate volume   Culture   Final    NO GROWTH < 12 HOURS Performed at [facility], [address], [place], [place] [address]    Report Status PENDING  Incomplete  Blood Culture (routine x 2)     Status: None (Preliminary result)   Collection Time: 02/04/20  9:51 PM   Specimen: BLOOD LEFT ARM  Result Value Ref Range Status   Specimen Description BLOOD LEFT ARM  Final   Special Requests   Final    BOTTLES DRAWN AEROBIC AND ANAEROBIC Blood Culture adequate volume   Culture   Final    NO GROWTH < 12 HOURS Performed at [facility], [address], [place], [place] [address]    Report Status PENDING  Incomplete         Radiology Studies: DG Chest Port 1 View  Result Date: 02/04/2020 CLINICAL DATA:  Cough, COVID EXAM: PORTABLE CHEST 1 VIEW COMPARISON:  03/10/2007 FINDINGS: The heart size and mediastinal contours are within normal limits. Both lungs are clear. The visualized skeletal structures are unremarkable. IMPRESSION: No active disease. Electronically Signed   By: Jasmine Pang M.D.   On: 02/04/2020 21:39        Scheduled Meds: . albuterol  2 puff Inhalation Q6H  . vitamin C  500 mg Oral Daily  . dexamethasone (DECADRON) injection  6 mg Intravenous Q24H  . enoxaparin (LOVENOX) injection  40 mg Subcutaneous Q24H  . insulin aspart  0-20 Units Subcutaneous TID WC  . insulin detemir  0.15 Units/kg Subcutaneous BID  .  linagliptin  5 mg Oral Daily  . losartan  50 mg Oral Daily  . metFORMIN  850 mg Oral BID WC  . zinc sulfate  220 mg Oral Daily   Continuous Infusions: . remdesivir 100 mg in NS 100 mL       LOS: 0 days    Time  spent: 35 minutes    Chief Walkup Hoover Brunette, DO [facility]  If 7PM-7AM, please contact night-coverage [email or web address] 02/05/2020, 11:15 AM

## 2020-02-05 NOTE — ED Notes (Signed)
Pt o2 dropped to 71% on 10L HFNC, placed on 15L HFNC now at 90%.  RT notified

## 2020-02-05 NOTE — ED Notes (Signed)
Pt breakfast tray sat on bedside table. Pt sleeping at this time.

## 2020-02-05 NOTE — ED Notes (Addendum)
Pt's O2 dropped to 84% while ambulating to bedside commode. O2 turned up to 6L [place]. O2 is now 96%. Will continue to monitor.

## 2020-02-06 ENCOUNTER — Other Ambulatory Visit (HOSPITAL_COMMUNITY): Payer: PRIVATE HEALTH INSURANCE

## 2020-02-06 DIAGNOSIS — E785 Hyperlipidemia, unspecified: Secondary | ICD-10-CM

## 2020-02-06 DIAGNOSIS — F32A Depression, unspecified: Secondary | ICD-10-CM

## 2020-02-06 DIAGNOSIS — E1165 Type 2 diabetes mellitus with hyperglycemia: Secondary | ICD-10-CM

## 2020-02-06 DIAGNOSIS — J9601 Acute respiratory failure with hypoxia: Secondary | ICD-10-CM

## 2020-02-06 DIAGNOSIS — E669 Obesity, unspecified: Secondary | ICD-10-CM

## 2020-02-06 DIAGNOSIS — I10 Essential (primary) hypertension: Secondary | ICD-10-CM

## 2020-02-06 LAB — CBC WITH DIFFERENTIAL/PLATELET
Abs Immature Granulocytes: 0.06 10*3/uL (ref 0.00–0.07)
Basophils Absolute: 0 10*3/uL (ref 0.0–0.1)
Basophils Relative: 0 %
Eosinophils Absolute: 0 10*3/uL (ref 0.0–0.5)
Eosinophils Relative: 0 %
HCT: 37.6 % (ref 36.0–46.0)
Hemoglobin: 11.8 g/dL — ABNORMAL LOW (ref 12.0–15.0)
Immature Granulocytes: 1 %
Lymphocytes Relative: 11 %
Lymphs Abs: 0.9 10*3/uL (ref 0.7–4.0)
MCH: 28.6 pg (ref 26.0–34.0)
MCHC: 31.4 g/dL (ref 30.0–36.0)
MCV: 91.3 fL (ref 80.0–100.0)
Monocytes Absolute: 0.2 10*3/uL (ref 0.1–1.0)
Monocytes Relative: 2 %
Neutro Abs: 7.1 10*3/uL (ref 1.7–7.7)
Neutrophils Relative %: 86 %
Platelets: 381 10*3/uL (ref 150–400)
RBC: 4.12 MIL/uL (ref 3.87–5.11)
RDW: 14 % (ref 11.5–15.5)
WBC: 8.2 10*3/uL (ref 4.0–10.5)
nRBC: 0 % (ref 0.0–0.2)

## 2020-02-06 LAB — COMPREHENSIVE METABOLIC PANEL
ALT: 30 U/L (ref 0–44)
AST: 50 U/L — ABNORMAL HIGH (ref 15–41)
Albumin: 3.2 g/dL — ABNORMAL LOW (ref 3.5–5.0)
Alkaline Phosphatase: 60 U/L (ref 38–126)
Anion gap: 11 (ref 5–15)
BUN: 18 mg/dL (ref 6–20)
CO2: 25 mmol/L (ref 22–32)
Calcium: 8.9 mg/dL (ref 8.9–10.3)
Chloride: 100 mmol/L (ref 98–111)
Creatinine, Ser: 0.74 mg/dL (ref 0.44–1.00)
GFR, Estimated: >60 mL/min (ref >60)
Glucose, Bld: 229 mg/dL — ABNORMAL HIGH (ref 70–99)
Potassium: 4 mmol/L (ref 3.5–5.1)
Sodium: 136 mmol/L (ref 135–145)
Total Bilirubin: 0.6 mg/dL (ref 0.3–1.2)
Total Protein: 7.6 g/dL (ref 6.5–8.1)

## 2020-02-06 LAB — GLUCOSE, CAPILLARY
Glucose-Capillary: 167 mg/dL — ABNORMAL HIGH (ref 70–99)
Glucose-Capillary: 95 mg/dL (ref 70–99)
Glucose-Capillary: 80 mg/dL (ref 70–99)

## 2020-02-06 LAB — C-REACTIVE PROTEIN: CRP: 8.2 mg/dL — ABNORMAL HIGH (ref <1.0)

## 2020-02-06 LAB — CBG MONITORING, ED: Glucose-Capillary: 192 mg/dL — ABNORMAL HIGH (ref 70–99)

## 2020-02-06 LAB — FERRITIN: Ferritin: 424 ng/mL — ABNORMAL HIGH (ref 11–307)

## 2020-02-06 LAB — D-DIMER, QUANTITATIVE: D-Dimer, Quant: 2.49 ug/mL-FEU — ABNORMAL HIGH (ref 0.00–0.50)

## 2020-02-06 LAB — HIV ANTIBODY (ROUTINE TESTING W REFLEX): HIV Screen 4th Generation wRfx: NONREACTIVE

## 2020-02-06 MED ORDER — INSULIN ASPART 100 UNIT/ML ~~LOC~~ SOLN
6.0000 [IU] | Freq: Three times a day (TID) | SUBCUTANEOUS | Status: DC
  Administered 2020-02-06 – 2020-02-13 (×23): 6 [IU] via SUBCUTANEOUS

## 2020-02-06 MED ORDER — BARICITINIB 2 MG PO TABS
4.0000 mg | ORAL_TABLET | Freq: Every day | ORAL | Status: AC
  Administered 2020-02-06 – 2020-02-19 (×14): 4 mg via ORAL
  Filled 2020-02-06 (×14): qty 2

## 2020-02-06 NOTE — Progress Notes (Signed)
Patient not given IS and flutter at this time. Patient is taking deep breaths on her own and coughing up productive sputum. Patient becomes very short of breath coughing and after taking inhaler and desaturates.

## 2020-02-06 NOTE — ED Notes (Signed)
Pt o2 dropped to the 70s, placed on 15L non-rebreather. RT notified. o2 now 94%.

## 2020-02-06 NOTE — Progress Notes (Signed)
PROGRESS NOTE    Christina Vincent  TDD:[id number] DOB: [birth year] DOA: 02/04/2020 PCP: Avon Gully, MD   Chief Complaint  Patient presents with  . Shortness of Breath    Brief Narrative:   Christina Vincent a 59 y.o.femalewith medical history significant ofdepression, dyslipidemia, type 2 diabetes, class I obesity, hypertension who is coming to the emergency department due to progressively worse shortness of breath for the past 6 days associated with fever, chills, fatigue, malaise, sore throat, occasionally productive of whitish/clear dry cough and rhinorrhea.  Patient is unvaccinated and has been admitted for acute hypoxemic respiratory failure secondary to COVID-19 and is currently on more than 10 L high flow nasal cannula supplementation.    Assessment & Plan: 1-acute respiratory failure with hypoxia due to COVID-19 pneumonia -Continue to wean down oxygen supplementation as tolerated -Continue the use of flutter valve/incentive respirometer -Patient has been advised to prop herself as much as she can tolerate and to reposition herself side to side to maintain adequate saturation and lungs expansion. -Continue the use of dexamethasone, and remdesivir and initiate treatment with baricitinib -Continue to follow inflammatory markers, CBC and CMP. -Continue vitamin C and zinc  2-hypertension -Continue losartan 50 mg daily -Continue to follow blood pressure, renal function and electrolytes. -Heart healthy diet has been advised.  3-type 2 diabetes with hyperglycemia -Stable therapy contributing to patient's elevated CBG -Continue Tradjenta, Levemir and sliding scale insulin -Holding Metformin while inpatient -NovoLog for meal coverage has been added -Follow CBGs and further adjust hypoglycemic regimen as required.  4-hyperlipidemia/dyslipidemia -Holding statins with mild transaminitis during this admission -Heart healthy diet has been encouraged. -Follow LFTs.  5-class I  obesity -Body mass index is 33.15 kg/m. -Low calorie diet, portion control increase physical activity discussed with patient.  6-depression -No suicidal ideation or hallucination -Continue mirtazapine  DVT prophylaxis: Lovenox Code Status: Full code Family Communication: No family member at bedside.  Patient expressed that she will contact family members with updates. Disposition:   Status is: Inpatient  Dispo: The patient is from: Home              Anticipated d/c is to: Home              Anticipated d/c date is: 2-3 days.              Patient currently no medically stable for discharge; still with significant respiratory distress requiring over 10 L high flow nasal cannula supplementation.  She is unable to speak in full sentences and is complaining of intermittent nonproductive coughing spells.  Continue treatment with remdesivir, steroids and is started baricitinib.  Continue incentive spirometer, flutter valve and supportive care.  Continue to wean off oxygen supplementation as tolerated.       Consultants:      Procedures:  See below for x-ray reports.   Antimicrobials/antiviral:  Remdesivir day 3 out of 5.  Subjective: Afebrile, no chest pain, no nausea or vomiting.  Patient with worsening respiratory distress, inability to speak in full sentences and complains of ongoing short winded sensation and intermittent nonproductive cough.  Requiring over 10 L high flow nasal cannula supplementation at this time.  Objective: Vitals:   02/06/20 0700 02/06/20 0730 02/06/20 0800 02/06/20 0828  BP: (!) 105/52 (!) 98/53 (!) 102/55   Pulse: 93 85 81   Resp: 15 12 13    Temp:      TempSrc:      SpO2: 92% 98% 97% 96%  Weight:  Height:        Intake/Output Summary (Last 24 hours) at 02/06/2020 0907 Last data filed at 02/05/2020 1759 Gross per 24 hour  Intake 101.07 ml  Output --  Net 101.07 ml   Filed Weights   02/04/20 1838  Weight: 98.9 kg     Examination: General exam: Alert, awake, oriented x 3; afebrile currently, denies chest pain.  With increased shortness of breath and difficulty speaking in full sentences.  Patient currently requiring over 10 L of high flow nasal cannula supplementation to maintain O2 sats.  Patient reports intermittent nonproductive coughing spells. Respiratory system: Positive tachypnea, diffuse rhonchi bilaterally; no using accessory muscle.  No wheezing.  Unable to speak in full sentences. Cardiovascular system:RRR. No murmurs, rubs, gallops.  No JVD. Gastrointestinal system: Abdomen is nondistended, soft and nontender. No organomegaly or masses felt. Normal bowel sounds heard. Central nervous system: Alert and oriented. No focal neurological deficits. Extremities: No cyanosis or clubbing. No lower extremity edema appreciated. Skin: No rashes, no petechiae. Psychiatry: Judgement and insight appear normal. Mood & affect appropriate.    Data Reviewed: I have personally reviewed following labs and imaging studies  CBC: Recent Labs  Lab 02/04/20 2132 02/05/20 0348  WBC 6.1 6.7  NEUTROABS 5.1 6.0  HGB 11.9* 11.3*  HCT 36.9 34.6*  MCV 88.1 88.0  PLT 275 280    Basic Metabolic Panel: Recent Labs  Lab 02/04/20 2132 02/05/20 0348 02/06/20 0507  NA 134* 136 136  K 3.2* 3.8 4.0  CL 99 100 100  CO2 25 24 25   GLUCOSE 162* 250* 229*  BUN 12 9 18   CREATININE 0.68 0.63 0.74  CALCIUM 8.4* 8.4* 8.9  MG 1.7  --   --   PHOS 3.2  --   --     GFR: Estimated Creatinine Clearance: 95.4 mL/min (by C-G formula based on SCr of 0.74 mg/dL).  Liver Function Tests: Recent Labs  Lab 02/04/20 2132 02/05/20 0348 02/06/20 0507  AST 49* 49* 50*  ALT 25 26 30   ALKPHOS 60 59 60  BILITOT 0.7 0.5 0.6  PROT 7.9 7.7 7.6  ALBUMIN 3.5 3.3* 3.2*    CBG: Recent Labs  Lab 02/05/20 0341 02/05/20 0837 02/05/20 1231 02/05/20 1726 02/06/20 0835  GLUCAP 245* 223* 150* 112* 192*    Recent Results (from  the past 240 hour(s))  Resp Panel by RT-PCR (Flu A&B, Covid) Nasopharyngeal Swab     Status: Abnormal   Collection Time: 02/04/20  6:40 PM   Specimen: Nasopharyngeal Swab; Nasopharyngeal(NP) swabs in vial transport medium  Result Value Ref Range Status   SARS Coronavirus 2 by RT PCR POSITIVE (A) NEGATIVE Final    Comment: RESULT CALLED TO, READ BACK BY AND VERIFIED WITH: OAKLEY,B AT 1958 ON 02/04/2020 BY MOSLEY,J (NOTE) SARS-CoV-2 target nucleic acids are DETECTED.  The SARS-CoV-2 RNA is generally detectable in upper respiratory specimens during the acute phase of infection. Positive results are indicative of the presence of the identified virus, but do not rule out bacterial infection or co-infection with other pathogens not detected by the test. Clinical correlation with patient history and other diagnostic information is necessary to determine patient infection status. The expected result is Negative.  Fact Sheet for Patients: BloggerCourse.comhttps://www.fda.gov/media/152166/download  Fact Sheet for Healthcare Providers: SeriousBroker.ithttps://www.fda.gov/media/152162/download  This test is not yet approved or cleared by the [place] FDA and  has been authorized for detection and/or diagnosis of SARS-CoV-2 by FDA under an Emergency Use Authorization (EUA).  This EUA will  remain in effect (meaning this te st can be used) for the duration of  the COVID-19 declaration under Section 564(b)(1) of the Act, 21 U.S.C. section 360bbb-3(b)(1), unless the authorization is terminated or revoked sooner.     Influenza A by PCR NEGATIVE NEGATIVE Final   Influenza B by PCR NEGATIVE NEGATIVE Final    Comment: (NOTE) The Xpert Xpress SARS-CoV-2/FLU/RSV plus assay is intended as an aid in the diagnosis of influenza from Nasopharyngeal swab specimens and should not be used as a sole basis for treatment. Nasal washings and aspirates are unacceptable for Xpert Xpress SARS-CoV-2/FLU/RSV testing.  Fact Sheet for  Patients: BloggerCourse.com  Fact Sheet for Healthcare Providers: SeriousBroker.it  This test is not yet approved or cleared by the [place] FDA and has been authorized for detection and/or diagnosis of SARS-CoV-2 by FDA under an Emergency Use Authorization (EUA). This EUA will remain in effect (meaning this test can be used) for the duration of the COVID-19 declaration under Section 564(b)(1) of the Act, 21 U.S.C. section 360bbb-3(b)(1), unless the authorization is terminated or revoked.  Performed at [facility], [address], [place], [place] [address]   Blood Culture (routine x 2)     Status: None (Preliminary result)   Collection Time: 02/04/20  9:32 PM   Specimen: BLOOD RIGHT ARM  Result Value Ref Range Status   Specimen Description BLOOD RIGHT ARM  Final   Special Requests   Final    BOTTLES DRAWN AEROBIC AND ANAEROBIC Blood Culture adequate volume   Culture   Final    NO GROWTH < 12 HOURS Performed at [facility], [address], [place], [place] [address]    Report Status PENDING  Incomplete  Blood Culture (routine x 2)     Status: None (Preliminary result)   Collection Time: 02/04/20  9:51 PM   Specimen: BLOOD LEFT ARM  Result Value Ref Range Status   Specimen Description BLOOD LEFT ARM  Final   Special Requests   Final    BOTTLES DRAWN AEROBIC AND ANAEROBIC Blood Culture adequate volume   Culture   Final    NO GROWTH < 12 HOURS Performed at [facility], [address] [place], [place] [address]    Report Status PENDING  Incomplete     Radiology Studies: DG Chest Port 1 View  Result Date: 02/04/2020 CLINICAL DATA:  Cough, COVID EXAM: PORTABLE CHEST 1 VIEW COMPARISON:  03/10/2007 FINDINGS: The heart size and mediastinal contours are within normal limits. Both lungs are clear. The visualized skeletal structures are unremarkable. IMPRESSION: No active disease. Electronically Signed   By: Jasmine Pang M.D.    On: 02/04/2020 21:39    Scheduled Meds: . albuterol  2 puff Inhalation Q6H  . vitamin C  500 mg Oral Daily  . baricitinib  4 mg Oral Daily  . dexamethasone (DECADRON) injection  6 mg Intravenous Q24H  . enoxaparin (LOVENOX) injection  40 mg Subcutaneous Q24H  . insulin aspart  0-20 Units Subcutaneous TID WC  . insulin aspart  6 Units Subcutaneous TID WC  . insulin detemir  0.15 Units/kg Subcutaneous BID  . linagliptin  5 mg Oral Daily  . losartan  50 mg Oral Daily  . mirtazapine  7.5 mg Oral QHS  . zinc sulfate  220 mg Oral Daily   Continuous Infusions: . remdesivir 100 mg in NS 100 mL Stopped (02/05/20 1759)     LOS: 1 day    Time spent: 35 minutes.    Mikle Bosworth  Gwenlyn Perking, MD [facility]   To contact the attending provider between 7A-7P or the covering provider during after hours 7P-7A, please log into the web site [email or web address] and access using universal [place] password for that web site. If you do not have the password, please call the hospital operator.  02/06/2020, 9:07 AM

## 2020-02-06 NOTE — ED Notes (Signed)
[facility] placing pt on heated HFNC for transport.

## 2020-02-07 ENCOUNTER — Encounter (HOSPITAL_COMMUNITY): Payer: Self-pay | Admitting: Internal Medicine

## 2020-02-07 ENCOUNTER — Inpatient Hospital Stay (HOSPITAL_COMMUNITY): Payer: 59

## 2020-02-07 LAB — CBC WITH DIFFERENTIAL/PLATELET
Abs Immature Granulocytes: 0.09 10*3/uL — ABNORMAL HIGH (ref 0.00–0.07)
Basophils Absolute: 0 10*3/uL (ref 0.0–0.1)
Basophils Relative: 0 %
Eosinophils Absolute: 0 10*3/uL (ref 0.0–0.5)
Eosinophils Relative: 0 %
HCT: 34.4 % — ABNORMAL LOW (ref 36.0–46.0)
Hemoglobin: 11.1 g/dL — ABNORMAL LOW (ref 12.0–15.0)
Immature Granulocytes: 1 %
Lymphocytes Relative: 9 %
Lymphs Abs: 1 10*3/uL (ref 0.7–4.0)
MCH: 28.3 pg (ref 26.0–34.0)
MCHC: 32.3 g/dL (ref 30.0–36.0)
MCV: 87.8 fL (ref 80.0–100.0)
Monocytes Absolute: 0.4 10*3/uL (ref 0.1–1.0)
Monocytes Relative: 3 %
Neutro Abs: 9.2 10*3/uL — ABNORMAL HIGH (ref 1.7–7.7)
Neutrophils Relative %: 87 %
Platelets: 279 10*3/uL (ref 150–400)
RBC: 3.92 MIL/uL (ref 3.87–5.11)
RDW: 13.6 % (ref 11.5–15.5)
WBC: 10.7 10*3/uL — ABNORMAL HIGH (ref 4.0–10.5)
nRBC: 0 % (ref 0.0–0.2)

## 2020-02-07 LAB — COMPREHENSIVE METABOLIC PANEL
ALT: 33 U/L (ref 0–44)
AST: 51 U/L — ABNORMAL HIGH (ref 15–41)
Albumin: 3.1 g/dL — ABNORMAL LOW (ref 3.5–5.0)
Alkaline Phosphatase: 64 U/L (ref 38–126)
Anion gap: 10 (ref 5–15)
BUN: 18 mg/dL (ref 6–20)
CO2: 26 mmol/L (ref 22–32)
Calcium: 8.7 mg/dL — ABNORMAL LOW (ref 8.9–10.3)
Chloride: 100 mmol/L (ref 98–111)
Creatinine, Ser: 0.67 mg/dL (ref 0.44–1.00)
GFR, Estimated: >60 mL/min (ref >60)
Glucose, Bld: 207 mg/dL — ABNORMAL HIGH (ref 70–99)
Potassium: 4 mmol/L (ref 3.5–5.1)
Sodium: 136 mmol/L (ref 135–145)
Total Bilirubin: 0.7 mg/dL (ref 0.3–1.2)
Total Protein: 7.5 g/dL (ref 6.5–8.1)

## 2020-02-07 LAB — D-DIMER, QUANTITATIVE: D-Dimer, Quant: >20 ug/mL-FEU — ABNORMAL HIGH (ref 0.00–0.50)

## 2020-02-07 LAB — GLUCOSE, CAPILLARY
Glucose-Capillary: 177 mg/dL — ABNORMAL HIGH (ref 70–99)
Glucose-Capillary: 112 mg/dL — ABNORMAL HIGH (ref 70–99)
Glucose-Capillary: 128 mg/dL — ABNORMAL HIGH (ref 70–99)
Glucose-Capillary: 73 mg/dL (ref 70–99)

## 2020-02-07 LAB — C-REACTIVE PROTEIN: CRP: 7 mg/dL — ABNORMAL HIGH (ref <1.0)

## 2020-02-07 LAB — FERRITIN: Ferritin: 346 ng/mL — ABNORMAL HIGH (ref 11–307)

## 2020-02-07 MED ORDER — IOHEXOL 350 MG/ML SOLN
100.0000 mL | Freq: Once | INTRAVENOUS | Status: AC | PRN
  Administered 2020-02-07: 100 mL via INTRAVENOUS

## 2020-02-07 MED ORDER — ENOXAPARIN SODIUM 100 MG/ML ~~LOC~~ SOLN
1.0000 mg/kg | Freq: Two times a day (BID) | SUBCUTANEOUS | Status: DC
  Administered 2020-02-07 – 2020-02-08 (×2): 100 mg via SUBCUTANEOUS
  Filled 2020-02-07 (×2): qty 1

## 2020-02-07 NOTE — Progress Notes (Signed)
PROGRESS NOTE    TRISTINA SAHAGIAN  HFW:[id number] DOB: 1960-10-21 DOA: 02/04/2020 PCP: Avon Gully, MD   Brief Narrative:Christina D Siddleis a 59 y.o.femalewith medical history significant ofdepression, dyslipidemia, type 2 diabetes, class I obesity, hypertension who is coming to the emergency department due to progressively worse shortness of breath for the past 6 days associated with fever, chills, fatigue, malaise, sore throat, occasionally productive of whitish/clear dry cough and rhinorrhea.Patient is unvaccinated and has been admitted for acute hypoxemic respiratory failure secondary to COVID-19 and is currently on more than 10 L high flow nasal cannula supplementation.    Assessment & Plan:   Principal Problem:   2019 novel coronavirus disease (COVID-19) Active Problems:   Hypertension   Type 2 diabetes mellitus (HCC)   Dyslipidemia   Depression   Class 1 obesity   Acute hypoxemic respiratory failure due to COVID-19 (HCC)    -acute respiratory failure with hypoxia due to COVID-19 pneumonia- D DIMER MORE THAN 20  CHECK CT CHEST -Continue to wean down oxygen supplementation as tolerated -Continue the use of flutter valve/incentive respirometer -Patient has been advised to prop herself as much as she can tolerate and to reposition herself side to side to maintain adequate saturation and lungs expansion. -Continue the use of dexamethasone, and remdesivir and initiate treatment with baricitinib -Continue to follow inflammatory markers, CBC and CMP. -Continue vitamin C and zinc  2-hypertension-ON LOSARTAN  3-type 2 diabetes with hyperglycemia -Continue Tradjenta, Levemir and sliding scale insulin -Holding Metformin while inpatient -NovoLog for meal coverage has been added  4-hyperlipidemia/dyslipidemia -Holding statins with mild transaminitis during this admission -Heart healthy diet has been encouraged. -Follow LFTs.  5-class I obesity -Body mass index is 33.15  kg/m. -Low calorie diet, portion control increase physical activity discussed with patient.  6-depression ON REMERON  Estimated body mass index is 33.15 kg/m as calculated from the following:   Height as of this encounter: 5\' 8"  (1.727 m).   Weight as of this encounter: 98.9 kg.  DVT prophylaxis: Lovenox Code Status: Full code Family Communication: No family member at bedside.  Patient expressed that she will contact family members with updates. Disposition:   Status is: Inpatient   Dispo: The patient is from: Home              Anticipated d/c is to: Home              Anticipated d/c date THAN 3 DAYS              Patient currently is not medically stable to d/c.   Consultants: NONE  Procedures: NONE Antimicrobials:NONE Subjective: SLEEPING PRONE  FEELS BETTER THAN YESTERDAY  Objective: Vitals:   02/07/20 0300 02/07/20 0535 02/07/20 0930 02/07/20 1231  BP:  (!) 105/57  108/68  Pulse:  92  (!) 101  Resp:  (!) 24  (!) 22  Temp:  98.3 F (36.8 C)  (!) 101.8 F (38.8 C)  TempSrc:  Oral  Oral  SpO2: 97% 94% 94% 95%  Weight:      Height:        Intake/Output Summary (Last 24 hours) at 02/07/2020 1442 Last data filed at 02/07/2020 1300 Gross per 24 hour  Intake 125 ml  Output --  Net 125 ml   Filed Weights   02/04/20 1838  Weight: 98.9 kg    Examination:  General exam: Appears calm and comfortable  Respiratory system: SCATTERED RHONCHI B/L to auscultation. Respiratory effort normal. Cardiovascular system: S1 &  S2 heard, RRR. No JVD, murmurs, rubs, gallops or clicks. No pedal edema. Gastrointestinal system: Abdomen is nondistended, soft and nontender. No organomegaly or masses felt. Normal bowel sounds heard. Central nervous system: Alert and oriented. No focal neurological deficits. Extremities: Symmetric 5 x 5 power. Skin: No rashes, lesions or ulcers Psychiatry: Judgement and insight appear normal. Mood & affect appropriate.     Data  Reviewed: I have personally reviewed following labs and imaging studies  CBC: Recent Labs  Lab 02/04/20 2132 02/05/20 0348 02/06/20 0507 02/07/20 0447  WBC 6.1 6.7 8.2 10.7*  NEUTROABS 5.1 6.0 7.1 9.2*  HGB 11.9* 11.3* 11.8* 11.1*  HCT 36.9 34.6* 37.6 34.4*  MCV 88.1 88.0 91.3 87.8  PLT 275 280 381 279   Basic Metabolic Panel: Recent Labs  Lab 02/04/20 2132 02/05/20 0348 02/06/20 0507 02/07/20 0447  NA 134* 136 136 136  K 3.2* 3.8 4.0 4.0  CL 99 100 100 100  CO2 25 24 25 26   GLUCOSE 162* 250* 229* 207*  BUN 12 9 18 18   CREATININE 0.68 0.63 0.74 0.67  CALCIUM 8.4* 8.4* 8.9 8.7*  MG 1.7  --   --   --   PHOS 3.2  --   --   --    GFR: Estimated Creatinine Clearance: 95.4 mL/min (by C-G formula based on SCr of 0.67 mg/dL). Liver Function Tests: Recent Labs  Lab 02/04/20 2132 02/05/20 0348 02/06/20 0507 02/07/20 0447  AST 49* 49* 50* 51*  ALT 25 26 30  33  ALKPHOS 60 59 60 64  BILITOT 0.7 0.5 0.6 0.7  PROT 7.9 7.7 7.6 7.5  ALBUMIN 3.5 3.3* 3.2* 3.1*   No results for input(s): LIPASE, AMYLASE in the last 168 hours. No results for input(s): AMMONIA in the last 168 hours. Coagulation Profile: No results for input(s): INR, PROTIME in the last 168 hours. Cardiac Enzymes: No results for input(s): CKTOTAL, CKMB, CKMBINDEX, TROPONINI in the last 168 hours. BNP (last 3 results) No results for input(s): PROBNP in the last 8760 hours. HbA1C: No results for input(s): HGBA1C in the last 72 hours. CBG: Recent Labs  Lab 02/06/20 1119 02/06/20 1647 02/06/20 2141 02/07/20 0749 02/07/20 1247  GLUCAP 167* 95 80 177* 112*   Lipid Profile: Recent Labs    02/04/20 2132  TRIG 124   Thyroid Function Tests: No results for input(s): TSH, T4TOTAL, FREET4, T3FREE, THYROIDAB in the last 72 hours. Anemia Panel: Recent Labs    02/06/20 0507 02/07/20 0447  FERRITIN 424* 346*   Sepsis Labs: Recent Labs  Lab 02/04/20 2132 02/05/20 0218  PROCALCITON <0.10  --    LATICACIDVEN 1.2 1.0    Recent Results (from the past 240 hour(s))  Resp Panel by RT-PCR (Flu A&B, Covid) Nasopharyngeal Swab     Status: Abnormal   Collection Time: 02/04/20  6:40 PM   Specimen: Nasopharyngeal Swab; Nasopharyngeal(NP) swabs in vial transport medium  Result Value Ref Range Status   SARS Coronavirus 2 by RT PCR POSITIVE (A) NEGATIVE Final    Comment: RESULT CALLED TO, READ BACK BY AND VERIFIED WITH: OAKLEY,B AT 1958 ON 02/04/2020 BY MOSLEY,J (NOTE) SARS-CoV-2 target nucleic acids are DETECTED.  The SARS-CoV-2 RNA is generally detectable in upper respiratory specimens during the acute phase of infection. Positive results are indicative of the presence of the identified virus, but do not rule out bacterial infection or co-infection with other pathogens not detected by the test. Clinical correlation with patient history and other diagnostic information is necessary  to determine patient infection status. The expected result is Negative.  Fact Sheet for Patients: BloggerCourse.com  Fact Sheet for Healthcare Providers: SeriousBroker.it  This test is not yet approved or cleared by the [place] FDA and  has been authorized for detection and/or diagnosis of SARS-CoV-2 by FDA under an Emergency Use Authorization (EUA).  This EUA will remain in effect (meaning this te st can be used) for the duration of  the COVID-19 declaration under Section 564(b)(1) of the Act, 21 U.S.C. section 360bbb-3(b)(1), unless the authorization is terminated or revoked sooner.     Influenza A by PCR NEGATIVE NEGATIVE Final   Influenza B by PCR NEGATIVE NEGATIVE Final    Comment: (NOTE) The Xpert Xpress SARS-CoV-2/FLU/RSV plus assay is intended as an aid in the diagnosis of influenza from Nasopharyngeal swab specimens and should not be used as a sole basis for treatment. Nasal washings and aspirates are unacceptable for Xpert Xpress  SARS-CoV-2/FLU/RSV testing.  Fact Sheet for Patients: BloggerCourse.com  Fact Sheet for Healthcare Providers: SeriousBroker.it  This test is not yet approved or cleared by the [place] FDA and has been authorized for detection and/or diagnosis of SARS-CoV-2 by FDA under an Emergency Use Authorization (EUA). This EUA will remain in effect (meaning this test can be used) for the duration of the COVID-19 declaration under Section 564(b)(1) of the Act, 21 U.S.C. section 360bbb-3(b)(1), unless the authorization is terminated or revoked.  Performed at [facility], [address] [place], [place] [address]   Blood Culture (routine x 2)     Status: None (Preliminary result)   Collection Time: 02/04/20  9:32 PM   Specimen: BLOOD RIGHT ARM  Result Value Ref Range Status   Specimen Description BLOOD RIGHT ARM  Final   Special Requests   Final    BOTTLES DRAWN AEROBIC AND ANAEROBIC Blood Culture adequate volume   Culture   Final    NO GROWTH 3 DAYS Performed at [facility], [address] [place], [place] [address]    Report Status PENDING  Incomplete  Blood Culture (routine x 2)     Status: None (Preliminary result)   Collection Time: 02/04/20  9:51 PM   Specimen: BLOOD LEFT ARM  Result Value Ref Range Status   Specimen Description BLOOD LEFT ARM  Final   Special Requests   Final    BOTTLES DRAWN AEROBIC AND ANAEROBIC Blood Culture adequate volume   Culture   Final    NO GROWTH 3 DAYS Performed at [facility], [address] [place], [place] [address]    Report Status PENDING  Incomplete         Radiology Studies: No results found.      Scheduled Meds: . albuterol  2 puff Inhalation Q6H  . vitamin C  500 mg Oral Daily  . baricitinib  4 mg Oral Daily  . dexamethasone (DECADRON) injection  6 mg Intravenous Q24H  . enoxaparin (LOVENOX) injection  40 mg Subcutaneous Q24H  . insulin aspart  0-20 Units Subcutaneous  TID WC  . insulin aspart  6 Units Subcutaneous TID WC  . insulin detemir  0.15 Units/kg Subcutaneous BID  . linagliptin  5 mg Oral Daily  . losartan  50 mg Oral Daily  . mirtazapine  7.5 mg Oral QHS  . zinc sulfate  220 mg Oral Daily   Continuous Infusions: . remdesivir 100 mg in NS 100 mL 100 mg (02/07/20 1222)     LOS: 2 days  Alwyn Ren, MD  Password [facility] 02/07/2020, 2:42 PM

## 2020-02-08 ENCOUNTER — Inpatient Hospital Stay (HOSPITAL_COMMUNITY): Payer: 59

## 2020-02-08 DIAGNOSIS — R7989 Other specified abnormal findings of blood chemistry: Secondary | ICD-10-CM

## 2020-02-08 LAB — COMPREHENSIVE METABOLIC PANEL
ALT: 28 U/L (ref 0–44)
AST: 38 U/L (ref 15–41)
Albumin: 2.8 g/dL — ABNORMAL LOW (ref 3.5–5.0)
Alkaline Phosphatase: 67 U/L (ref 38–126)
Anion gap: 11 (ref 5–15)
BUN: 14 mg/dL (ref 6–20)
CO2: 24 mmol/L (ref 22–32)
Calcium: 8.4 mg/dL — ABNORMAL LOW (ref 8.9–10.3)
Chloride: 103 mmol/L (ref 98–111)
Creatinine, Ser: 0.71 mg/dL (ref 0.44–1.00)
GFR, Estimated: >60 mL/min (ref >60)
Glucose, Bld: 164 mg/dL — ABNORMAL HIGH (ref 70–99)
Potassium: 3.6 mmol/L (ref 3.5–5.1)
Sodium: 138 mmol/L (ref 135–145)
Total Bilirubin: 1 mg/dL (ref 0.3–1.2)
Total Protein: 7.2 g/dL (ref 6.5–8.1)

## 2020-02-08 LAB — CBC WITH DIFFERENTIAL/PLATELET
Abs Immature Granulocytes: 0.15 10*3/uL — ABNORMAL HIGH (ref 0.00–0.07)
Basophils Absolute: 0 10*3/uL (ref 0.0–0.1)
Basophils Relative: 0 %
Eosinophils Absolute: 0 10*3/uL (ref 0.0–0.5)
Eosinophils Relative: 0 %
HCT: 35.4 % — ABNORMAL LOW (ref 36.0–46.0)
Hemoglobin: 11.2 g/dL — ABNORMAL LOW (ref 12.0–15.0)
Immature Granulocytes: 2 %
Lymphocytes Relative: 11 %
Lymphs Abs: 1.1 10*3/uL (ref 0.7–4.0)
MCH: 28.2 pg (ref 26.0–34.0)
MCHC: 31.6 g/dL (ref 30.0–36.0)
MCV: 89.2 fL (ref 80.0–100.0)
Monocytes Absolute: 0.2 10*3/uL (ref 0.1–1.0)
Monocytes Relative: 2 %
Neutro Abs: 8.7 10*3/uL — ABNORMAL HIGH (ref 1.7–7.7)
Neutrophils Relative %: 85 %
Platelets: 254 10*3/uL (ref 150–400)
RBC: 3.97 MIL/uL (ref 3.87–5.11)
RDW: 13.6 % (ref 11.5–15.5)
WBC: 10.2 10*3/uL (ref 4.0–10.5)
nRBC: 0 % (ref 0.0–0.2)

## 2020-02-08 LAB — GLUCOSE, CAPILLARY
Glucose-Capillary: 171 mg/dL — ABNORMAL HIGH (ref 70–99)
Glucose-Capillary: 122 mg/dL — ABNORMAL HIGH (ref 70–99)
Glucose-Capillary: 192 mg/dL — ABNORMAL HIGH (ref 70–99)
Glucose-Capillary: 171 mg/dL — ABNORMAL HIGH (ref 70–99)

## 2020-02-08 LAB — C-REACTIVE PROTEIN: CRP: 20.6 mg/dL — ABNORMAL HIGH (ref <1.0)

## 2020-02-08 LAB — D-DIMER, QUANTITATIVE: D-Dimer, Quant: >20 ug/mL-FEU — ABNORMAL HIGH (ref 0.00–0.50)

## 2020-02-08 LAB — FERRITIN: Ferritin: 312 ng/mL — ABNORMAL HIGH (ref 11–307)

## 2020-02-08 MED ORDER — METHYLPREDNISOLONE SODIUM SUCC 125 MG IJ SOLR
60.0000 mg | Freq: Four times a day (QID) | INTRAMUSCULAR | Status: DC
  Administered 2020-02-08 – 2020-02-14 (×23): 60 mg via INTRAVENOUS
  Filled 2020-02-08 (×23): qty 2

## 2020-02-08 MED ORDER — LOSARTAN POTASSIUM 25 MG PO TABS
25.0000 mg | ORAL_TABLET | Freq: Every day | ORAL | Status: DC
  Administered 2020-02-09 – 2020-02-12 (×4): 25 mg via ORAL
  Filled 2020-02-08 (×4): qty 1

## 2020-02-08 MED ORDER — ENOXAPARIN SODIUM 60 MG/0.6ML ~~LOC~~ SOLN
50.0000 mg | SUBCUTANEOUS | Status: DC
  Administered 2020-02-09 – 2020-02-12 (×4): 50 mg via SUBCUTANEOUS
  Filled 2020-02-08 (×4): qty 0.6

## 2020-02-08 NOTE — Progress Notes (Signed)
Bilateral lower extremity venous duplex has been completed. Preliminary results can be found in CV Proc through chart review.   02/08/20 8:56 AM Olen Cordial RVT

## 2020-02-08 NOTE — Progress Notes (Signed)
PROGRESS NOTE    Christina Vincent  MCN:[id number] DOB: Aug 26, 1960 DOA: 02/04/2020 PCP: Avon Gully, MD   Brief Narrative:Christina D Siddleis a 59 y.o.femalewith medical history significant ofdepression, dyslipidemia, type 2 diabetes, class I obesity, hypertension who is coming to the emergency department due to progressively worse shortness of breath for the past 6 days associated with fever, chills, fatigue, malaise, sore throat, occasionally productive of whitish/clear dry cough and rhinorrhea.Patient is unvaccinated and has been admitted for acute hypoxemic respiratory failure secondary to COVID-19 and is currently on  15 L of oxygen.  Assessment & Plan:   Principal Problem:   2019 novel coronavirus disease (COVID-19) Active Problems:   Hypertension   Type 2 diabetes mellitus (HCC)   Dyslipidemia   Depression   Class 1 obesity   Acute hypoxemic respiratory failure due to COVID-19 (HCC)    1-acute respiratory failure with hypoxia due to COVID-19 pneumonia-she was 83% on room air in the ER.  She still remains on high flow nasal cannula 15 L with NRB.  Since her D-dimer was more than 20 a stat CT scan of the chest was done to rule out PE she does not have any evidence of pulmonary embolism.  Lower extremity Dopplers were negative for DVT.  Will continue higher dose of Lovenox 50 mg twice a day. Patient reports that she is sleeping prone and is anxious to get better and go home. Continue baricitinib Continue remdesivir finish the course. I have changed her from Decadron to Solu-Medrol. Continue vitamin C and zinc. Trend inflammatory markers.   CRP has jumped from 7.0-20. D-dimer is more than 20  2-hypertension-blood pressure 116/67 decrease losartan to 25 mg daily.  3-type 2 diabetes with hyperglycemia- CBG (last 3)  Recent Labs    02/07/20 2133 02/08/20 0757 02/08/20 1218  GLUCAP 73 171* 122*    -Continue Tradjenta, Levemir and sliding scale insulin -Holding  Metformin while inpatient -NovoLog for meal coverage has been added  4-hyperlipidemia/dyslipidemia -Holding statins with mild transaminitis during this admission -Heart healthy diet has been encouraged. -Follow LFTs.  5-class I obesity -Body mass index is 33.15 kg/m. -Low calorie diet, portion control increase physical activity discussed with patient.  6-depression ON REMERON  Estimated body mass index is 33.15 kg/m as calculated from the following:   Height as of this encounter: 5\' 8"  (1.727 m).   Weight as of this encounter: 98.9 kg.  DVT prophylaxis: Lovenox Code Status: Full code Family Communication: No family member at bedside.  Patient expressed that she will contact family members with updates. Disposition:   Status is: Inpatient   Dispo: The patient is from: Home              Anticipated d/c is to: Home              Anticipated d/c date THAN 3 DAYS              Patient currently is not medically stable to d/c.   Consultants: NONE  Procedures: NONE Antimicrobials:NONE Subjective: SLEEPING PRONE  FEELS BETTER THAN YESTERDAY  Objective: Vitals:   02/08/20 0919 02/08/20 0934 02/08/20 1140 02/08/20 1224  BP:    116/67  Pulse: 95 95 (!) 109 (!) 103  Resp: 19 19 20 14   Temp:    98.7 F (37.1 C)  TempSrc:    Oral  SpO2: 94%  91% 91%  Weight:      Height:        Intake/Output Summary (Last  24 hours) at 02/08/2020 1342 Last data filed at 02/08/2020 0411 Gross per 24 hour  Intake 600 ml  Output 300 ml  Net 300 ml   Filed Weights   02/04/20 1838  Weight: 98.9 kg    Examination:  General exam: Appears calm and comfortable  Respiratory system: SCATTERED RHONCHI B/L to auscultation. Respiratory effort normal. Cardiovascular system: S1 & S2 heard, RRR. No JVD, murmurs, rubs, gallops or clicks. No pedal edema. Gastrointestinal system: Abdomen is nondistended, soft and nontender. No organomegaly or masses felt. Normal bowel sounds  heard. Central nervous system: Alert and oriented. No focal neurological deficits. Extremities: Symmetric 5 x 5 power. Skin: No rashes, lesions or ulcers Psychiatry: Judgement and insight appear normal. Mood & affect appropriate.     Data Reviewed: I have personally reviewed following labs and imaging studies  CBC: Recent Labs  Lab 02/04/20 2132 02/05/20 0348 02/06/20 0507 02/07/20 0447 02/08/20 0444  WBC 6.1 6.7 8.2 10.7* 10.2  NEUTROABS 5.1 6.0 7.1 9.2* 8.7*  HGB 11.9* 11.3* 11.8* 11.1* 11.2*  HCT 36.9 34.6* 37.6 34.4* 35.4*  MCV 88.1 88.0 91.3 87.8 89.2  PLT 275 280 381 279 254   Basic Metabolic Panel: Recent Labs  Lab 02/04/20 2132 02/05/20 0348 02/06/20 0507 02/07/20 0447 02/08/20 0444  NA 134* 136 136 136 138  K 3.2* 3.8 4.0 4.0 3.6  CL 99 100 100 100 103  CO2 25 24 25 26 24   GLUCOSE 162* 250* 229* 207* 164*  BUN 12 9 18 18 14   CREATININE 0.68 0.63 0.74 0.67 0.71  CALCIUM 8.4* 8.4* 8.9 8.7* 8.4*  MG 1.7  --   --   --   --   PHOS 3.2  --   --   --   --    GFR: Estimated Creatinine Clearance: 95.4 mL/min (by C-G formula based on SCr of 0.71 mg/dL). Liver Function Tests: Recent Labs  Lab 02/04/20 2132 02/05/20 0348 02/06/20 0507 02/07/20 0447 02/08/20 0444  AST 49* 49* 50* 51* 38  ALT 25 26 30  33 28  ALKPHOS 60 59 60 64 67  BILITOT 0.7 0.5 0.6 0.7 1.0  PROT 7.9 7.7 7.6 7.5 7.2  ALBUMIN 3.5 3.3* 3.2* 3.1* 2.8*   No results for input(s): LIPASE, AMYLASE in the last 168 hours. No results for input(s): AMMONIA in the last 168 hours. Coagulation Profile: No results for input(s): INR, PROTIME in the last 168 hours. Cardiac Enzymes: No results for input(s): CKTOTAL, CKMB, CKMBINDEX, TROPONINI in the last 168 hours. BNP (last 3 results) No results for input(s): PROBNP in the last 8760 hours. HbA1C: No results for input(s): HGBA1C in the last 72 hours. CBG: Recent Labs  Lab 02/07/20 1247 02/07/20 1648 02/07/20 2133 02/08/20 0757 02/08/20 1218   GLUCAP 112* 128* 73 171* 122*   Lipid Profile: No results for input(s): CHOL, HDL, LDLCALC, TRIG, CHOLHDL, LDLDIRECT in the last 72 hours. Thyroid Function Tests: No results for input(s): TSH, T4TOTAL, FREET4, T3FREE, THYROIDAB in the last 72 hours. Anemia Panel: Recent Labs    02/07/20 0447 02/08/20 0444  FERRITIN 346* 312*   Sepsis Labs: Recent Labs  Lab 02/04/20 2132 02/05/20 0218  PROCALCITON <0.10  --   LATICACIDVEN 1.2 1.0    Recent Results (from the past 240 hour(s))  Resp Panel by RT-PCR (Flu A&B, Covid) Nasopharyngeal Swab     Status: Abnormal   Collection Time: 02/04/20  6:40 PM   Specimen: Nasopharyngeal Swab; Nasopharyngeal(NP) swabs in vial transport medium  Result Value Ref Range Status   SARS Coronavirus 2 by RT PCR POSITIVE (A) NEGATIVE Final    Comment: RESULT CALLED TO, READ BACK BY AND VERIFIED WITH: OAKLEY,B AT 1958 ON 02/04/2020 BY MOSLEY,J (NOTE) SARS-CoV-2 target nucleic acids are DETECTED.  The SARS-CoV-2 RNA is generally detectable in upper respiratory specimens during the acute phase of infection. Positive results are indicative of the presence of the identified virus, but do not rule out bacterial infection or co-infection with other pathogens not detected by the test. Clinical correlation with patient history and other diagnostic information is necessary to determine patient infection status. The expected result is Negative.  Fact Sheet for Patients: BloggerCourse.com  Fact Sheet for Healthcare Providers: SeriousBroker.it  This test is not yet approved or cleared by the [place] FDA and  has been authorized for detection and/or diagnosis of SARS-CoV-2 by FDA under an Emergency Use Authorization (EUA).  This EUA will remain in effect (meaning this te st can be used) for the duration of  the COVID-19 declaration under Section 564(b)(1) of the Act, 21 U.S.C. section 360bbb-3(b)(1),  unless the authorization is terminated or revoked sooner.     Influenza A by PCR NEGATIVE NEGATIVE Final   Influenza B by PCR NEGATIVE NEGATIVE Final    Comment: (NOTE) The Xpert Xpress SARS-CoV-2/FLU/RSV plus assay is intended as an aid in the diagnosis of influenza from Nasopharyngeal swab specimens and should not be used as a sole basis for treatment. Nasal washings and aspirates are unacceptable for Xpert Xpress SARS-CoV-2/FLU/RSV testing.  Fact Sheet for Patients: BloggerCourse.com  Fact Sheet for Healthcare Providers: SeriousBroker.it  This test is not yet approved or cleared by the [place] FDA and has been authorized for detection and/or diagnosis of SARS-CoV-2 by FDA under an Emergency Use Authorization (EUA). This EUA will remain in effect (meaning this test can be used) for the duration of the COVID-19 declaration under Section 564(b)(1) of the Act, 21 U.S.C. section 360bbb-3(b)(1), unless the authorization is terminated or revoked.  Performed at [facility], [address] [place], [place] [address]   Blood Culture (routine x 2)     Status: None (Preliminary result)   Collection Time: 02/04/20  9:32 PM   Specimen: BLOOD RIGHT ARM  Result Value Ref Range Status   Specimen Description BLOOD RIGHT ARM  Final   Special Requests   Final    BOTTLES DRAWN AEROBIC AND ANAEROBIC Blood Culture adequate volume   Culture   Final    NO GROWTH 4 DAYS Performed at [facility], [address] [place], [place] [address]    Report Status PENDING  Incomplete  Blood Culture (routine x 2)     Status: None (Preliminary result)   Collection Time: 02/04/20  9:51 PM   Specimen: BLOOD LEFT ARM  Result Value Ref Range Status   Specimen Description BLOOD LEFT ARM  Final   Special Requests   Final    BOTTLES DRAWN AEROBIC AND ANAEROBIC Blood Culture adequate volume   Culture   Final    NO GROWTH 4 DAYS Performed at [facility], [address], [place], [place] [address]    Report Status PENDING  Incomplete         Radiology Studies: CT ANGIO CHEST PE W OR WO CONTRAST  Result Date: 02/07/2020 CLINICAL DATA:  Cough fever COVID positive EXAM: CT ANGIOGRAPHY CHEST WITH CONTRAST TECHNIQUE: Multidetector CT imaging of the chest was performed using the standard protocol during bolus administration of intravenous  contrast. Multiplanar CT image reconstructions and MIPs were obtained to evaluate the vascular anatomy. CONTRAST:  OMNIPAQUE IOHEXOL 350 MG/ML SOLN COMPARISON:  None. FINDINGS: Cardiovascular: There is slightly suboptimal opacification of the main pulmonary artery, however no central or proximal segmental pulmonary embolism. The heart is normal in size. No pericardial effusion or thickening. No evidence right heart strain. There is normal three-vessel brachiocephalic anatomy without proximal stenosis. The thoracic aorta is normal in appearance. Mediastinum/Nodes: No hilar, mediastinal, or axillary adenopathy. Thyroid gland, trachea, and esophagus demonstrate no significant findings. Lungs/Pleura: Extensive multifocal patchy airspace opacities are seen throughout both lungs predominantly at the lung bases. There air bronchograms and bronchial wall thickening within both lung bases. No pleural effusion is seen. Upper Abdomen: No acute abnormalities present in the visualized portions of the upper abdomen. A small hiatal hernia is present. Musculoskeletal: No chest wall abnormality. No acute or significant osseous findings. Review of the MIP images confirms the above findings. IMPRESSION: Slightly suboptimal opacification of the main pulmonary artery, however no central or proximal segmental pulmonary embolism. Extensive multifocal airspace opacities, consistent with multifocal pneumonia. Electronically Signed   By: Jonna Clark M.D.   On: 02/07/2020 23:53   VAS [place] LOWER EXTREMITY VENOUS (DVT)  Result Date:  02/08/2020  Lower Venous DVT Study Indications: Elevated Ddimer.  Risk Factors: COVID 19 positive. Comparison Study: No prior studies. Performing Technologist: Chanda Busing RVT  Examination Guidelines: A complete evaluation includes B-mode imaging, spectral Doppler, color Doppler, and power Doppler as needed of all accessible portions of each vessel. Bilateral testing is considered an integral part of a complete examination. Limited examinations for reoccurring indications may be performed as noted. The reflux portion of the exam is performed with the patient in reverse Trendelenburg.  +---------+---------------+---------+-----------+----------+-------------------+ RIGHT    CompressibilityPhasicitySpontaneityPropertiesThrombus Aging      +---------+---------------+---------+-----------+----------+-------------------+ CFV      Full           Yes      Yes                                      +---------+---------------+---------+-----------+----------+-------------------+ SFJ      Full                                                             +---------+---------------+---------+-----------+----------+-------------------+ FV Prox  Full                                                             +---------+---------------+---------+-----------+----------+-------------------+ FV Mid   Full                                                             +---------+---------------+---------+-----------+----------+-------------------+ FV DistalFull                                                             +---------+---------------+---------+-----------+----------+-------------------+  PFV      Full                                                             +---------+---------------+---------+-----------+----------+-------------------+ POP      Full           Yes      Yes                                       +---------+---------------+---------+-----------+----------+-------------------+ PTV      Full                                                             +---------+---------------+---------+-----------+----------+-------------------+ PERO                                                  Not well visualized +---------+---------------+---------+-----------+----------+-------------------+   +---------+---------------+---------+-----------+----------+--------------+ LEFT     CompressibilityPhasicitySpontaneityPropertiesThrombus Aging +---------+---------------+---------+-----------+----------+--------------+ CFV      Full           Yes      Yes                                 +---------+---------------+---------+-----------+----------+--------------+ SFJ      Full                                                        +---------+---------------+---------+-----------+----------+--------------+ FV Prox  Full                                                        +---------+---------------+---------+-----------+----------+--------------+ FV Mid   Full                                                        +---------+---------------+---------+-----------+----------+--------------+ FV DistalFull                                                        +---------+---------------+---------+-----------+----------+--------------+ PFV      Full                                                        +---------+---------------+---------+-----------+----------+--------------+  POP      Full           Yes      Yes                                 +---------+---------------+---------+-----------+----------+--------------+ PTV      Full                                                        +---------+---------------+---------+-----------+----------+--------------+ PERO     Full                                                         +---------+---------------+---------+-----------+----------+--------------+     Summary: RIGHT: - There is no evidence of deep vein thrombosis in the lower extremity. However, portions of this examination were limited- see technologist comments above.  - No cystic structure found in the popliteal fossa.  LEFT: - There is no evidence of deep vein thrombosis in the lower extremity. However, portions of this examination were limited- see technologist comments above.  - No cystic structure found in the popliteal fossa.  *See table(s) above for measurements and observations.    Preliminary         Scheduled Meds: . albuterol  2 puff Inhalation Q6H  . vitamin C  500 mg Oral Daily  . baricitinib  4 mg Oral Daily  . dexamethasone (DECADRON) injection  6 mg Intravenous Q24H  . [START ON 02/09/2020] enoxaparin (LOVENOX) injection  50 mg Subcutaneous Q24H  . insulin aspart  0-20 Units Subcutaneous TID WC  . insulin aspart  6 Units Subcutaneous TID WC  . insulin detemir  0.15 Units/kg Subcutaneous BID  . linagliptin  5 mg Oral Daily  . losartan  50 mg Oral Daily  . mirtazapine  7.5 mg Oral QHS  . zinc sulfate  220 mg Oral Daily   Continuous Infusions:    LOS: 3 days     Alwyn RenElizabeth G Jamilynn Whitacre, MD  Password [facility] 02/08/2020, 1:42 PM

## 2020-02-09 DIAGNOSIS — R7989 Other specified abnormal findings of blood chemistry: Secondary | ICD-10-CM | POA: Diagnosis not present

## 2020-02-09 DIAGNOSIS — J1282 Pneumonia due to coronavirus disease 2019: Secondary | ICD-10-CM | POA: Diagnosis present

## 2020-02-09 DIAGNOSIS — G9341 Metabolic encephalopathy: Secondary | ICD-10-CM | POA: Diagnosis not present

## 2020-02-09 DIAGNOSIS — J9601 Acute respiratory failure with hypoxia: Secondary | ICD-10-CM | POA: Diagnosis not present

## 2020-02-09 DIAGNOSIS — E1165 Type 2 diabetes mellitus with hyperglycemia: Secondary | ICD-10-CM | POA: Diagnosis not present

## 2020-02-09 DIAGNOSIS — K567 Ileus, unspecified: Secondary | ICD-10-CM | POA: Diagnosis not present

## 2020-02-09 DIAGNOSIS — E876 Hypokalemia: Secondary | ICD-10-CM | POA: Diagnosis present

## 2020-02-09 DIAGNOSIS — F32A Depression, unspecified: Secondary | ICD-10-CM | POA: Diagnosis not present

## 2020-02-09 DIAGNOSIS — J9602 Acute respiratory failure with hypercapnia: Secondary | ICD-10-CM | POA: Diagnosis not present

## 2020-02-09 DIAGNOSIS — E872 Acidosis: Secondary | ICD-10-CM | POA: Diagnosis not present

## 2020-02-09 DIAGNOSIS — N281 Cyst of kidney, acquired: Secondary | ICD-10-CM | POA: Diagnosis present

## 2020-02-09 DIAGNOSIS — U071 COVID-19: Secondary | ICD-10-CM | POA: Diagnosis present

## 2020-02-09 DIAGNOSIS — I10 Essential (primary) hypertension: Secondary | ICD-10-CM | POA: Diagnosis present

## 2020-02-09 DIAGNOSIS — I2699 Other pulmonary embolism without acute cor pulmonale: Secondary | ICD-10-CM | POA: Diagnosis present

## 2020-02-09 DIAGNOSIS — E785 Hyperlipidemia, unspecified: Secondary | ICD-10-CM | POA: Diagnosis present

## 2020-02-09 DIAGNOSIS — R442 Other hallucinations: Secondary | ICD-10-CM | POA: Diagnosis not present

## 2020-02-09 DIAGNOSIS — D62 Acute posthemorrhagic anemia: Secondary | ICD-10-CM | POA: Diagnosis not present

## 2020-02-09 DIAGNOSIS — J15212 Pneumonia due to Methicillin resistant Staphylococcus aureus: Secondary | ICD-10-CM | POA: Diagnosis not present

## 2020-02-09 DIAGNOSIS — T148XXA Other injury of unspecified body region, initial encounter: Secondary | ICD-10-CM | POA: Diagnosis not present

## 2020-02-09 DIAGNOSIS — F410 Panic disorder [episodic paroxysmal anxiety] without agoraphobia: Secondary | ICD-10-CM | POA: Diagnosis not present

## 2020-02-09 DIAGNOSIS — J80 Acute respiratory distress syndrome: Secondary | ICD-10-CM | POA: Diagnosis present

## 2020-02-09 DIAGNOSIS — F329 Major depressive disorder, single episode, unspecified: Secondary | ICD-10-CM | POA: Diagnosis not present

## 2020-02-09 DIAGNOSIS — K1379 Other lesions of oral mucosa: Secondary | ICD-10-CM | POA: Diagnosis not present

## 2020-02-09 DIAGNOSIS — E669 Obesity, unspecified: Secondary | ICD-10-CM | POA: Diagnosis not present

## 2020-02-09 LAB — CBC WITH DIFFERENTIAL/PLATELET
Abs Immature Granulocytes: 0.23 10*3/uL — ABNORMAL HIGH (ref 0.00–0.07)
Basophils Absolute: 0 10*3/uL (ref 0.0–0.1)
Basophils Relative: 0 %
Eosinophils Absolute: 0 10*3/uL (ref 0.0–0.5)
Eosinophils Relative: 0 %
HCT: 35.4 % — ABNORMAL LOW (ref 36.0–46.0)
Hemoglobin: 11.5 g/dL — ABNORMAL LOW (ref 12.0–15.0)
Immature Granulocytes: 2 %
Lymphocytes Relative: 7 %
Lymphs Abs: 1 10*3/uL (ref 0.7–4.0)
MCH: 28.4 pg (ref 26.0–34.0)
MCHC: 32.5 g/dL (ref 30.0–36.0)
MCV: 87.4 fL (ref 80.0–100.0)
Monocytes Absolute: 0.2 10*3/uL (ref 0.1–1.0)
Monocytes Relative: 1 %
Neutro Abs: 13.3 10*3/uL — ABNORMAL HIGH (ref 1.7–7.7)
Neutrophils Relative %: 90 %
Platelets: 239 10*3/uL (ref 150–400)
RBC: 4.05 MIL/uL (ref 3.87–5.11)
RDW: 13.4 % (ref 11.5–15.5)
WBC: 14.7 10*3/uL — ABNORMAL HIGH (ref 4.0–10.5)
nRBC: 0 % (ref 0.0–0.2)

## 2020-02-09 LAB — COMPREHENSIVE METABOLIC PANEL
ALT: 26 U/L (ref 0–44)
AST: 34 U/L (ref 15–41)
Albumin: 2.9 g/dL — ABNORMAL LOW (ref 3.5–5.0)
Alkaline Phosphatase: 84 U/L (ref 38–126)
Anion gap: 11 (ref 5–15)
BUN: 15 mg/dL (ref 6–20)
CO2: 27 mmol/L (ref 22–32)
Calcium: 8.8 mg/dL — ABNORMAL LOW (ref 8.9–10.3)
Chloride: 99 mmol/L (ref 98–111)
Creatinine, Ser: 0.65 mg/dL (ref 0.44–1.00)
GFR, Estimated: >60 mL/min (ref >60)
Glucose, Bld: 200 mg/dL — ABNORMAL HIGH (ref 70–99)
Potassium: 3.9 mmol/L (ref 3.5–5.1)
Sodium: 137 mmol/L (ref 135–145)
Total Bilirubin: 0.6 mg/dL (ref 0.3–1.2)
Total Protein: 7.8 g/dL (ref 6.5–8.1)

## 2020-02-09 LAB — GLUCOSE, CAPILLARY
Glucose-Capillary: 234 mg/dL — ABNORMAL HIGH (ref 70–99)
Glucose-Capillary: 350 mg/dL — ABNORMAL HIGH (ref 70–99)
Glucose-Capillary: 196 mg/dL — ABNORMAL HIGH (ref 70–99)
Glucose-Capillary: 193 mg/dL — ABNORMAL HIGH (ref 70–99)

## 2020-02-09 LAB — CULTURE, BLOOD (ROUTINE X 2)
Culture: NO GROWTH
Culture: NO GROWTH
Special Requests: ADEQUATE
Special Requests: ADEQUATE

## 2020-02-09 LAB — D-DIMER, QUANTITATIVE: D-Dimer, Quant: >20 ug/mL-FEU — ABNORMAL HIGH (ref 0.00–0.50)

## 2020-02-09 LAB — C-REACTIVE PROTEIN: CRP: 25.2 mg/dL — ABNORMAL HIGH (ref <1.0)

## 2020-02-09 LAB — FERRITIN: Ferritin: 369 ng/mL — ABNORMAL HIGH (ref 11–307)

## 2020-02-09 MED ORDER — ALBUTEROL SULFATE HFA 108 (90 BASE) MCG/ACT IN AERS
2.0000 | INHALATION_SPRAY | Freq: Three times a day (TID) | RESPIRATORY_TRACT | Status: DC
  Administered 2020-02-09 – 2020-02-10 (×5): 2 via RESPIRATORY_TRACT
  Filled 2020-02-09: qty 6.7

## 2020-02-09 NOTE — Plan of Care (Signed)

## 2020-02-09 NOTE — Progress Notes (Signed)
PROGRESS NOTE    DARREL BARONI  SWN:[id number] DOB: August 07, 1960 DOA: 02/04/2020 PCP: Avon Gully, MD   Brief Narrative:Rupal D Siddleis a 60 y.o.femalewith medical history significant ofdepression, dyslipidemia, type 2 diabetes, class I obesity, hypertension who is coming to the emergency department due to progressively worse shortness of breath for the past 6 days associated with fever, chills, fatigue, malaise, sore throat, occasionally productive of whitish/clear dry cough and rhinorrhea.Patient is unvaccinated and has been admitted for acute hypoxemic respiratory failure secondary to COVID-19 and is currently on  15 L of oxygen.  Assessment & Plan:   Principal Problem:   2019 novel coronavirus disease (COVID-19) Active Problems:   Hypertension   Type 2 diabetes mellitus (HCC)   Dyslipidemia   Depression   Class 1 obesity   Acute hypoxemic respiratory failure due to COVID-19 (HCC)    1-acute respiratory failure with hypoxia due to COVID-19 pneumonia-she was 83% on room air in the ER.  She still remains on high flow nasal cannula 15 L with NRB.  Since her D-dimer was more than 20 a stat CT scan of the chest was done to rule out PE she does not have any evidence of pulmonary embolism.  Lower extremity Dopplers were negative for DVT.  Will continue higher dose of Lovenox 50 mg twice a day. Patient reports that she is sleeping prone and is anxious to get better and go home. Continue baricitinib Continue remdesivir finish the course. I have changed her from Decadron to Solu-Medrol. Continue vitamin C and zinc. Trend inflammatory markers.   CRP 25.2  D-dimer is more than 20  2-hypertension-blood pressure 116/67 decrease losartan to 25 mg daily.  3-type 2 diabetes with hyperglycemia- CBG (last 3)  Recent Labs    02/08/20 2149 02/09/20 0905 02/09/20 1145  GLUCAP 171* 234* 350*    -Continue Tradjenta, Levemir and sliding scale insulin -Holding Metformin while  inpatient -NovoLog for meal coverage has been added  4-hyperlipidemia/dyslipidemia -Holding statins with mild transaminitis during this admission -Heart healthy diet has been encouraged. -Follow LFTs.  5-class I obesity -Body mass index is 33.15 kg/m. -Low calorie diet, portion control increase physical activity discussed with patient.  6-depression ON REMERON  Estimated body mass index is 33.15 kg/m as calculated from the following:   Height as of this encounter: 5\' 8"  (1.727 m).   Weight as of this encounter: 98.9 kg.  DVT prophylaxis: Lovenox Code Status: Full code Family Communication: No family member at bedside.  Patient expressed that she will contact family members with updates. Disposition:   Status is: Inpatient   Dispo: The patient is from: Home              Anticipated d/c is to: Home              Anticipated d/c date THAN 3 DAYS              Patient currently is not medically stable to d/c.   Consultants: NONE  Procedures: NONE Antimicrobials:NONE Subjective: She is actually feeling a lot better very tearful as she is feeling better she is able to move around in the room though she is still on 15 L of O2. Objective: Vitals:   02/08/20 1946 02/08/20 2154 02/09/20 0506 02/09/20 1148  BP: 115/76 110/60 118/83 111/71  Pulse: 97 80 89 94  Resp: (!) 22 (!) 22 18 (!) 24  Temp: 98.5 F (36.9 C) 98.7 F (37.1 C) 98.4 F (36.9 C) 98.5 F (  36.9 C)  TempSrc: Oral Oral Oral Oral  SpO2: (!) 89% 93% 92% 91%  Weight:      Height:        Intake/Output Summary (Last 24 hours) at 02/09/2020 1534 Last data filed at 02/09/2020 1100 Gross per 24 hour  Intake 240 ml  Output -  Net 240 ml   Filed Weights   02/04/20 1838  Weight: 98.9 kg    Examination:  General exam: Appears calm and comfortable  Respiratory system: SCATTERED RHONCHI B/L to auscultation. Respiratory effort normal. Cardiovascular system: S1 & S2 heard, RRR. No JVD, murmurs, rubs,  gallops or clicks. No pedal edema. Gastrointestinal system: Abdomen is nondistended, soft and nontender. No organomegaly or masses felt. Normal bowel sounds heard. Central nervous system: Alert and oriented. No focal neurological deficits. Extremities: Symmetric 5 x 5 power. Skin: No rashes, lesions or ulcers Psychiatry: Judgement and insight appear normal. Mood & affect appropriate.     Data Reviewed: I have personally reviewed following labs and imaging studies  CBC: Recent Labs  Lab 02/05/20 0348 02/06/20 0507 02/07/20 0447 02/08/20 0444 02/09/20 0512  WBC 6.7 8.2 10.7* 10.2 14.7*  NEUTROABS 6.0 7.1 9.2* 8.7* 13.3*  HGB 11.3* 11.8* 11.1* 11.2* 11.5*  HCT 34.6* 37.6 34.4* 35.4* 35.4*  MCV 88.0 91.3 87.8 89.2 87.4  PLT 280 381 279 254 239   Basic Metabolic Panel: Recent Labs  Lab 02/04/20 2132 02/05/20 0348 02/06/20 0507 02/07/20 0447 02/08/20 0444 02/09/20 0512  NA 134* 136 136 136 138 137  K 3.2* 3.8 4.0 4.0 3.6 3.9  CL 99 100 100 100 103 99  CO2 25 24 25 26 24 27   GLUCOSE 162* 250* 229* 207* 164* 200*  BUN 12 9 18 18 14 15   CREATININE 0.68 0.63 0.74 0.67 0.71 0.65  CALCIUM 8.4* 8.4* 8.9 8.7* 8.4* 8.8*  MG 1.7  --   --   --   --   --   PHOS 3.2  --   --   --   --   --    GFR: Estimated Creatinine Clearance: 95.4 mL/min (by C-G formula based on SCr of 0.65 mg/dL). Liver Function Tests: Recent Labs  Lab 02/05/20 0348 02/06/20 0507 02/07/20 0447 02/08/20 0444 02/09/20 0512  AST 49* 50* 51* 38 34  ALT 26 30 33 28 26  ALKPHOS 59 60 64 67 84  BILITOT 0.5 0.6 0.7 1.0 0.6  PROT 7.7 7.6 7.5 7.2 7.8  ALBUMIN 3.3* 3.2* 3.1* 2.8* 2.9*   No results for input(s): LIPASE, AMYLASE in the last 168 hours. No results for input(s): AMMONIA in the last 168 hours. Coagulation Profile: No results for input(s): INR, PROTIME in the last 168 hours. Cardiac Enzymes: No results for input(s): CKTOTAL, CKMB, CKMBINDEX, TROPONINI in the last 168 hours. BNP (last 3  results) No results for input(s): PROBNP in the last 8760 hours. HbA1C: No results for input(s): HGBA1C in the last 72 hours. CBG: Recent Labs  Lab 02/08/20 1218 02/08/20 1629 02/08/20 2149 02/09/20 0905 02/09/20 1145  GLUCAP 122* 192* 171* 234* 350*   Lipid Profile: No results for input(s): CHOL, HDL, LDLCALC, TRIG, CHOLHDL, LDLDIRECT in the last 72 hours. Thyroid Function Tests: No results for input(s): TSH, T4TOTAL, FREET4, T3FREE, THYROIDAB in the last 72 hours. Anemia Panel: Recent Labs    02/08/20 0444 02/09/20 0512  FERRITIN 312* 369*   Sepsis Labs: Recent Labs  Lab 02/04/20 2132 02/05/20 0218  PROCALCITON <0.10  --   LATICACIDVEN  1.2 1.0    Recent Results (from the past 240 hour(s))  Resp Panel by RT-PCR (Flu A&B, Covid) Nasopharyngeal Swab     Status: Abnormal   Collection Time: 02/04/20  6:40 PM   Specimen: Nasopharyngeal Swab; Nasopharyngeal(NP) swabs in vial transport medium  Result Value Ref Range Status   SARS Coronavirus 2 by RT PCR POSITIVE (A) NEGATIVE Final    Comment: RESULT CALLED TO, READ BACK BY AND VERIFIED WITH: OAKLEY,B AT 1958 ON 02/04/2020 BY MOSLEY,J (NOTE) SARS-CoV-2 target nucleic acids are DETECTED.  The SARS-CoV-2 RNA is generally detectable in upper respiratory specimens during the acute phase of infection. Positive results are indicative of the presence of the identified virus, but do not rule out bacterial infection or co-infection with other pathogens not detected by the test. Clinical correlation with patient history and other diagnostic information is necessary to determine patient infection status. The expected result is Negative.  Fact Sheet for Patients: BloggerCourse.comhttps://www.fda.gov/media/152166/download  Fact Sheet for Healthcare Providers: SeriousBroker.ithttps://www.fda.gov/media/152162/download  This test is not yet approved or cleared by the [place] FDA and  has been authorized for detection and/or diagnosis of SARS-CoV-2 by FDA  under an Emergency Use Authorization (EUA).  This EUA will remain in effect (meaning this te st can be used) for the duration of  the COVID-19 declaration under Section 564(b)(1) of the Act, 21 U.S.C. section 360bbb-3(b)(1), unless the authorization is terminated or revoked sooner.     Influenza A by PCR NEGATIVE NEGATIVE Final   Influenza B by PCR NEGATIVE NEGATIVE Final    Comment: (NOTE) The Xpert Xpress SARS-CoV-2/FLU/RSV plus assay is intended as an aid in the diagnosis of influenza from Nasopharyngeal swab specimens and should not be used as a sole basis for treatment. Nasal washings and aspirates are unacceptable for Xpert Xpress SARS-CoV-2/FLU/RSV testing.  Fact Sheet for Patients: BloggerCourse.comhttps://www.fda.gov/media/152166/download  Fact Sheet for Healthcare Providers: SeriousBroker.ithttps://www.fda.gov/media/152162/download  This test is not yet approved or cleared by the [place] FDA and has been authorized for detection and/or diagnosis of SARS-CoV-2 by FDA under an Emergency Use Authorization (EUA). This EUA will remain in effect (meaning this test can be used) for the duration of the COVID-19 declaration under Section 564(b)(1) of the Act, 21 U.S.C. section 360bbb-3(b)(1), unless the authorization is terminated or revoked.  Performed at [facility], [address] [place], [place] [address]   Blood Culture (routine x 2)     Status: None   Collection Time: 02/04/20  9:32 PM   Specimen: BLOOD RIGHT ARM  Result Value Ref Range Status   Specimen Description BLOOD RIGHT ARM  Final   Special Requests   Final    BOTTLES DRAWN AEROBIC AND ANAEROBIC Blood Culture adequate volume   Culture   Final    NO GROWTH 5 DAYS Performed at [facility], [address] [place], [place] [address]    Report Status 02/09/2020 FINAL  Final  Blood Culture (routine x 2)     Status: None   Collection Time: 02/04/20  9:51 PM   Specimen: BLOOD LEFT ARM  Result Value Ref Range Status   Specimen  Description BLOOD LEFT ARM  Final   Special Requests   Final    BOTTLES DRAWN AEROBIC AND ANAEROBIC Blood Culture adequate volume   Culture   Final    NO GROWTH 5 DAYS Performed at [facility], [address] [place], [place] [address]    Report Status 02/09/2020 FINAL  Final  Radiology Studies: CT ANGIO CHEST PE W OR WO CONTRAST  Result Date: 02/07/2020 CLINICAL DATA:  Cough fever COVID positive EXAM: CT ANGIOGRAPHY CHEST WITH CONTRAST TECHNIQUE: Multidetector CT imaging of the chest was performed using the standard protocol during bolus administration of intravenous contrast. Multiplanar CT image reconstructions and MIPs were obtained to evaluate the vascular anatomy. CONTRAST:  OMNIPAQUE IOHEXOL 350 MG/ML SOLN COMPARISON:  None. FINDINGS: Cardiovascular: There is slightly suboptimal opacification of the main pulmonary artery, however no central or proximal segmental pulmonary embolism. The heart is normal in size. No pericardial effusion or thickening. No evidence right heart strain. There is normal three-vessel brachiocephalic anatomy without proximal stenosis. The thoracic aorta is normal in appearance. Mediastinum/Nodes: No hilar, mediastinal, or axillary adenopathy. Thyroid gland, trachea, and esophagus demonstrate no significant findings. Lungs/Pleura: Extensive multifocal patchy airspace opacities are seen throughout both lungs predominantly at the lung bases. There air bronchograms and bronchial wall thickening within both lung bases. No pleural effusion is seen. Upper Abdomen: No acute abnormalities present in the visualized portions of the upper abdomen. A small hiatal hernia is present. Musculoskeletal: No chest wall abnormality. No acute or significant osseous findings. Review of the MIP images confirms the above findings. IMPRESSION: Slightly suboptimal opacification of the main pulmonary artery, however no central or proximal segmental pulmonary embolism. Extensive  multifocal airspace opacities, consistent with multifocal pneumonia. Electronically Signed   By: Jonna Clark M.D.   On: 02/07/2020 23:53   VAS [place] LOWER EXTREMITY VENOUS (DVT)  Result Date: 02/09/2020  Lower Venous DVT Study Indications: Elevated Ddimer.  Risk Factors: COVID 19 positive. Comparison Study: No prior studies. Performing Technologist: Chanda Busing RVT  Examination Guidelines: A complete evaluation includes B-mode imaging, spectral Doppler, color Doppler, and power Doppler as needed of all accessible portions of each vessel. Bilateral testing is considered an integral part of a complete examination. Limited examinations for reoccurring indications may be performed as noted. The reflux portion of the exam is performed with the patient in reverse Trendelenburg.  +---------+---------------+---------+-----------+----------+-------------------+ RIGHT    CompressibilityPhasicitySpontaneityPropertiesThrombus Aging      +---------+---------------+---------+-----------+----------+-------------------+ CFV      Full           Yes      Yes                                      +---------+---------------+---------+-----------+----------+-------------------+ SFJ      Full                                                             +---------+---------------+---------+-----------+----------+-------------------+ FV Prox  Full                                                             +---------+---------------+---------+-----------+----------+-------------------+ FV Mid   Full                                                             +---------+---------------+---------+-----------+----------+-------------------+  FV DistalFull                                                             +---------+---------------+---------+-----------+----------+-------------------+ PFV      Full                                                              +---------+---------------+---------+-----------+----------+-------------------+ POP      Full           Yes      Yes                                      +---------+---------------+---------+-----------+----------+-------------------+ PTV      Full                                                             +---------+---------------+---------+-----------+----------+-------------------+ PERO                                                  Not well visualized +---------+---------------+---------+-----------+----------+-------------------+   +---------+---------------+---------+-----------+----------+--------------+ LEFT     CompressibilityPhasicitySpontaneityPropertiesThrombus Aging +---------+---------------+---------+-----------+----------+--------------+ CFV      Full           Yes      Yes                                 +---------+---------------+---------+-----------+----------+--------------+ SFJ      Full                                                        +---------+---------------+---------+-----------+----------+--------------+ FV Prox  Full                                                        +---------+---------------+---------+-----------+----------+--------------+ FV Mid   Full                                                        +---------+---------------+---------+-----------+----------+--------------+ FV DistalFull                                                        +---------+---------------+---------+-----------+----------+--------------+  PFV      Full                                                        +---------+---------------+---------+-----------+----------+--------------+ POP      Full           Yes      Yes                                 +---------+---------------+---------+-----------+----------+--------------+ PTV      Full                                                         +---------+---------------+---------+-----------+----------+--------------+ PERO     Full                                                        +---------+---------------+---------+-----------+----------+--------------+     Summary: RIGHT: - There is no evidence of deep vein thrombosis in the lower extremity. However, portions of this examination were limited- see technologist comments above.  - No cystic structure found in the popliteal fossa.  LEFT: - There is no evidence of deep vein thrombosis in the lower extremity. However, portions of this examination were limited- see technologist comments above.  - No cystic structure found in the popliteal fossa.  *See table(s) above for measurements and observations. Electronically signed by Fabienne Bruns MD on 02/09/2020 at 9:57:19 AM.    Final         Scheduled Meds: . albuterol  2 puff Inhalation TID  . vitamin C  500 mg Oral Daily  . baricitinib  4 mg Oral Daily  . enoxaparin (LOVENOX) injection  50 mg Subcutaneous Q24H  . insulin aspart  0-20 Units Subcutaneous TID WC  . insulin aspart  6 Units Subcutaneous TID WC  . insulin detemir  0.15 Units/kg Subcutaneous BID  . linagliptin  5 mg Oral Daily  . losartan  25 mg Oral Daily  . methylPREDNISolone (SOLU-MEDROL) injection  60 mg Intravenous Q6H  . mirtazapine  7.5 mg Oral QHS  . zinc sulfate  220 mg Oral Daily   Continuous Infusions:    LOS: 4 days     Alwyn Ren, MD  [facility] 02/09/2020, 3:34 PM

## 2020-02-10 LAB — GLUCOSE, CAPILLARY
Glucose-Capillary: 214 mg/dL — ABNORMAL HIGH (ref 70–99)
Glucose-Capillary: 256 mg/dL — ABNORMAL HIGH (ref 70–99)
Glucose-Capillary: 229 mg/dL — ABNORMAL HIGH (ref 70–99)
Glucose-Capillary: 267 mg/dL — ABNORMAL HIGH (ref 70–99)

## 2020-02-10 MED ORDER — ALBUTEROL SULFATE HFA 108 (90 BASE) MCG/ACT IN AERS
2.0000 | INHALATION_SPRAY | Freq: Four times a day (QID) | RESPIRATORY_TRACT | Status: DC
  Administered 2020-02-10 – 2020-03-02 (×78): 2 via RESPIRATORY_TRACT

## 2020-02-10 MED ORDER — ALPRAZOLAM 0.25 MG PO TABS
0.2500 mg | ORAL_TABLET | Freq: Three times a day (TID) | ORAL | Status: DC | PRN
  Administered 2020-02-10 – 2020-02-18 (×15): 0.25 mg via ORAL
  Filled 2020-02-10 (×15): qty 1

## 2020-02-10 MED ORDER — LIP MEDEX EX OINT
TOPICAL_OINTMENT | CUTANEOUS | Status: DC | PRN
  Filled 2020-02-10 (×2): qty 7

## 2020-02-10 MED ORDER — SALINE SPRAY 0.65 % NA SOLN
1.0000 | NASAL | Status: DC | PRN
  Administered 2020-02-10: 1 via NASAL
  Filled 2020-02-10: qty 44

## 2020-02-10 NOTE — Progress Notes (Signed)
   02/10/20 0521  Respiratory  Respiratory (WDL) X  Respiratory Pattern Unlabored;Regular  Chest Assessment Chest expansion symmetrical  Bilateral Breath Sounds Diminished (anteriorly)  RR=28.  Pt states that she feels better.  O2 remain with NRB @15Liters  and HFNC @12liters .  Pt remains prone at this time.  O2 sats 94%. No acute distress noted currently.  Pt with Yellow MEW (2) based d/t elevated RR.  VS per MEWS 2.0 Guidelines.

## 2020-02-10 NOTE — Progress Notes (Signed)
   02/10/20 0300  Respiratory  Respiratory (WDL) X  Respiratory Pattern Shallow;Labored  Chest Assessment Chest expansion symmetrical  Bilateral Breath Sounds Diminished;Clear  Cough and Deep Breathe  Cough and Deep Breathe Yes  Pt with Shortness of breath noted. Laying on side in [employer] non-productive.  Sats 77%.  Pt helped to prone positioning.  O2 sats increased to about 88%. O2 sats increasing to about 90%.  RR=30bpm.  Amy RT is aware as well as the Critical care RN.

## 2020-02-10 NOTE — Plan of Care (Signed)
  Problem: Clinical Measurements: Goal: Diagnostic test results will improve Outcome: Not Progressing INTERVENTION: CONTINUE TO MONITOR LABS AND INFLAMMATORY MARKERS Goal: Respiratory complications will improve Outcome: Not Progressing  INTERVENTION: CONTINUE TO CONSULT WITH RT FOR ISSUES/CONCERNS WITH OXGENATION. Problem: Activity: Goal: Risk for activity intolerance will decrease Outcome: Not Progressing  INTERVENTION: INTRODUCE ACTIVITY AS PT TOLERATES; ALLOW REST PERIODS DURING MOBILITY.   Problem: Education: Goal: Knowledge of General Education information will improve Description: Including pain rating scale, medication(s)/side effects and non-pharmacologic comfort measures Outcome: Progressing   Problem: Health Behavior/Discharge Planning: Goal: Ability to manage health-related needs will improve Outcome: Progressing   Problem: Clinical Measurements: Goal: Ability to maintain clinical measurements within normal limits will improve Outcome: Progressing Goal: Will remain free from infection Outcome: Progressing Goal: Cardiovascular complication will be avoided Outcome: Progressing   Problem: Nutrition: Goal: Adequate nutrition will be maintained Outcome: Progressing   Problem: Coping: Goal: Level of anxiety will decrease Outcome: Progressing   Problem: Elimination: Goal: Will not experience complications related to bowel motility Outcome: Progressing Goal: Will not experience complications related to urinary retention Outcome: Progressing   Problem: Pain Managment: Goal: General experience of comfort will improve Outcome: Progressing   Problem: Safety: Goal: Ability to remain free from injury will improve Outcome: Progressing   Problem: Skin Integrity: Goal: Risk for impaired skin integrity will decrease Outcome: Progressing   Problem: Education: Goal: Knowledge of risk factors and measures for prevention of condition will improve Outcome: Progressing    Problem: Coping: Goal: Psychosocial and spiritual needs will be supported Outcome: Progressing   Problem: Respiratory: Goal: Will maintain a patent airway Outcome: Progressing Goal: Complications related to the disease process, condition or treatment will be avoided or minimized Outcome: Progressing

## 2020-02-10 NOTE — Progress Notes (Signed)
PROGRESS NOTE    Christina Vincent  BJS:[id number] DOB: 18-Nov-1960 DOA: 02/04/2020 PCP: Avon Gully, MD   Brief Narrative:Christina D Siddleis a 60 y.o.femalewith medical history significant ofdepression, dyslipidemia, type 2 diabetes, class I obesity, hypertension who is coming to the emergency department due to progressively worse shortness of breath for the past 6 days associated with fever, chills, fatigue, malaise, sore throat, occasionally productive of whitish/clear dry cough and rhinorrhea.Patient is unvaccinated and has been admitted for acute hypoxemic respiratory failure secondary to COVID-19 and is currently on  15 L of oxygen.  Assessment & Plan:  1-acute respiratory failure with hypoxia due to COVID-19 pneumonia-she was 83% on room air in the ER.  She still remains on high flow nasal cannula 15 L with NRB.  Since her D-dimer was more than 20 a stat CT scan of the chest was done to rule out PE she does not have any evidence of pulmonary embolism.  Lower extremity Dopplers were negative for DVT.  Will continue higher dose of Lovenox 50 mg twice a day. Patient reports that she is sleeping prone and is anxious to get better and go home. Continue baricitinib Continue remdesivir finish the course. I have changed her from Decadron to Solu-Medrol. Continue vitamin C and zinc. Trend inflammatory markers.   CRP 25.2  D-dimer is more than 20  2-hypertension- blood pressure 116/67 decrease losartan to 25 mg daily.  3-type 2 diabetes with hyperglycemia- -Continue Tradjenta, Levemir and sliding scale insulin -Holding Metformin while inpatient -NovoLog for meal coverage has been added  4-hyperlipidemia/dyslipidemia -Holding statins with mild transaminitis during this admission -Heart healthy diet has been encouraged. -Follow LFTs.  5-class I obesity -Body mass index is 33.15 kg/m. -Low calorie diet, portion control increase physical activity discussed with  patient.  6-depression ON REMERON  DVT prophylaxis: Lovenox Code Status: Full code Family Communication: No family member at bedside.  Patient expressed that she will contact family members with updates.  Disposition:   Status is: Inpatient   Dispo: The patient is from: Home              Anticipated d/c is to: Home              Anticipated d/c date YW:VPXT THAN 3 DAYS              Patient currently is not medically stable to d/c.   Consultants: NONE  Procedures: NONE Antimicrobials:NONE Subjective: Reports anxiety.  Reports difficulty sleeping.  No nausea or vomiting. Continues to have cough but improving from yesterday.  Objective: Vitals:   02/10/20 0300 02/10/20 0521 02/10/20 1059 02/10/20 1256  BP:  116/65 123/73 118/64  Pulse:  87 98 96  Resp:  (!) 28 20 (!) 22  Temp:  97.8 F (36.6 C) 98.2 F (36.8 C) 98.4 F (36.9 C)  TempSrc:  Oral Oral Oral  SpO2: 98% 95% 90% 91%  Weight:      Height:        Intake/Output Summary (Last 24 hours) at 02/10/2020 1812 Last data filed at 02/10/2020 0700 Gross per 24 hour  Intake 120 ml  Output -  Net 120 ml   Filed Weights   02/04/20 1838  Weight: 98.9 kg    Examination:  General: Appear in mild distress, no Rash; Oral Mucosa Clear, moist. no Abnormal Neck Mass Or lumps, Conjunctiva normal  Cardiovascular: S1 and S2 Present, no Murmur, Respiratory: increased respiratory effort, Bilateral Air entry present and bilateral Crackles, no wheezes Abdomen:  Bowel Sound present, Soft and no tenderness Extremities: no Pedal edema Neurology: alert and oriented to time, place, and person affect appropriate. no new focal deficit Gait not checked due to patient safety concerns    Data Reviewed: I have personally reviewed following labs and imaging studies  CBC: Recent Labs  Lab 02/05/20 0348 02/06/20 0507 02/07/20 0447 02/08/20 0444 02/09/20 0512  WBC 6.7 8.2 10.7* 10.2 14.7*  NEUTROABS 6.0 7.1 9.2* 8.7* 13.3*  HGB  11.3* 11.8* 11.1* 11.2* 11.5*  HCT 34.6* 37.6 34.4* 35.4* 35.4*  MCV 88.0 91.3 87.8 89.2 87.4  PLT 280 381 279 254 239   Basic Metabolic Panel: Recent Labs  Lab 02/04/20 2132 02/05/20 0348 02/06/20 0507 02/07/20 0447 02/08/20 0444 02/09/20 0512  NA 134* 136 136 136 138 137  K 3.2* 3.8 4.0 4.0 3.6 3.9  CL 99 100 100 100 103 99  CO2 25 24 25 26 24 27   GLUCOSE 162* 250* 229* 207* 164* 200*  BUN 12 9 18 18 14 15   CREATININE 0.68 0.63 0.74 0.67 0.71 0.65  CALCIUM 8.4* 8.4* 8.9 8.7* 8.4* 8.8*  MG 1.7  --   --   --   --   --   PHOS 3.2  --   --   --   --   --    GFR: Estimated Creatinine Clearance: 95.4 mL/min (by C-G formula based on SCr of 0.65 mg/dL). Liver Function Tests: Recent Labs  Lab 02/05/20 0348 02/06/20 0507 02/07/20 0447 02/08/20 0444 02/09/20 0512  AST 49* 50* 51* 38 34  ALT 26 30 33 28 26  ALKPHOS 59 60 64 67 84  BILITOT 0.5 0.6 0.7 1.0 0.6  PROT 7.7 7.6 7.5 7.2 7.8  ALBUMIN 3.3* 3.2* 3.1* 2.8* 2.9*   No results for input(s): LIPASE, AMYLASE in the last 168 hours. No results for input(s): AMMONIA in the last 168 hours. Coagulation Profile: No results for input(s): INR, PROTIME in the last 168 hours. Cardiac Enzymes: No results for input(s): CKTOTAL, CKMB, CKMBINDEX, TROPONINI in the last 168 hours. BNP (last 3 results) No results for input(s): PROBNP in the last 8760 hours. HbA1C: No results for input(s): HGBA1C in the last 72 hours. CBG: Recent Labs  Lab 02/09/20 1615 02/09/20 2110 02/10/20 0735 02/10/20 1131 02/10/20 1731  GLUCAP 196* 193* 214* 256* 229*   Lipid Profile: No results for input(s): CHOL, HDL, LDLCALC, TRIG, CHOLHDL, LDLDIRECT in the last 72 hours. Thyroid Function Tests: No results for input(s): TSH, T4TOTAL, FREET4, T3FREE, THYROIDAB in the last 72 hours. Anemia Panel: Recent Labs    02/08/20 0444 02/09/20 0512  FERRITIN 312* 369*   Sepsis Labs: Recent Labs  Lab 02/04/20 2132 02/05/20 0218  PROCALCITON <0.10  --    LATICACIDVEN 1.2 1.0    Recent Results (from the past 240 hour(s))  Resp Panel by RT-PCR (Flu A&B, Covid) Nasopharyngeal Swab     Status: Abnormal   Collection Time: 02/04/20  6:40 PM   Specimen: Nasopharyngeal Swab; Nasopharyngeal(NP) swabs in vial transport medium  Result Value Ref Range Status   SARS Coronavirus 2 by RT PCR POSITIVE (A) NEGATIVE Final    Comment: RESULT CALLED TO, READ BACK BY AND VERIFIED WITH: OAKLEY,B AT 1958 ON 02/04/2020 BY MOSLEY,J (NOTE) SARS-CoV-2 target nucleic acids are DETECTED.  The SARS-CoV-2 RNA is generally detectable in upper respiratory specimens during the acute phase of infection. Positive results are indicative of the presence of the identified virus, but do not rule out bacterial  infection or co-infection with other pathogens not detected by the test. Clinical correlation with patient history and other diagnostic information is necessary to determine patient infection status. The expected result is Negative.  Fact Sheet for Patients: BloggerCourse.com  Fact Sheet for Healthcare Providers: SeriousBroker.it  This test is not yet approved or cleared by the [place] FDA and  has been authorized for detection and/or diagnosis of SARS-CoV-2 by FDA under an Emergency Use Authorization (EUA).  This EUA will remain in effect (meaning this te st can be used) for the duration of  the COVID-19 declaration under Section 564(b)(1) of the Act, 21 U.S.C. section 360bbb-3(b)(1), unless the authorization is terminated or revoked sooner.     Influenza A by PCR NEGATIVE NEGATIVE Final   Influenza B by PCR NEGATIVE NEGATIVE Final    Comment: (NOTE) The Xpert Xpress SARS-CoV-2/FLU/RSV plus assay is intended as an aid in the diagnosis of influenza from Nasopharyngeal swab specimens and should not be used as a sole basis for treatment. Nasal washings and aspirates are unacceptable for Xpert Xpress  SARS-CoV-2/FLU/RSV testing.  Fact Sheet for Patients: BloggerCourse.com  Fact Sheet for Healthcare Providers: SeriousBroker.it  This test is not yet approved or cleared by the [place] FDA and has been authorized for detection and/or diagnosis of SARS-CoV-2 by FDA under an Emergency Use Authorization (EUA). This EUA will remain in effect (meaning this test can be used) for the duration of the COVID-19 declaration under Section 564(b)(1) of the Act, 21 U.S.C. section 360bbb-3(b)(1), unless the authorization is terminated or revoked.  Performed at [facility], [address] [place], [place] [address]   Blood Culture (routine x 2)     Status: None   Collection Time: 02/04/20  9:32 PM   Specimen: BLOOD RIGHT ARM  Result Value Ref Range Status   Specimen Description BLOOD RIGHT ARM  Final   Special Requests   Final    BOTTLES DRAWN AEROBIC AND ANAEROBIC Blood Culture adequate volume   Culture   Final    NO GROWTH 5 DAYS Performed at [facility], [address] [place], [place] [address]    Report Status 02/09/2020 FINAL  Final  Blood Culture (routine x 2)     Status: None   Collection Time: 02/04/20  9:51 PM   Specimen: BLOOD LEFT ARM  Result Value Ref Range Status   Specimen Description BLOOD LEFT ARM  Final   Special Requests   Final    BOTTLES DRAWN AEROBIC AND ANAEROBIC Blood Culture adequate volume   Culture   Final    NO GROWTH 5 DAYS Performed at [facility], [address] [place], [place] [address]    Report Status 02/09/2020 FINAL  Final         Radiology Studies: No results found.      Scheduled Meds: . albuterol  2 puff Inhalation Q6H  . vitamin C  500 mg Oral Daily  . baricitinib  4 mg Oral Daily  . enoxaparin (LOVENOX) injection  50 mg Subcutaneous Q24H  . insulin aspart  0-20 Units Subcutaneous TID WC  . insulin aspart  6 Units Subcutaneous TID WC  . insulin detemir  0.15 Units/kg  Subcutaneous BID  . linagliptin  5 mg Oral Daily  . losartan  25 mg Oral Daily  . methylPREDNISolone (SOLU-MEDROL) injection  60 mg Intravenous Q6H  . mirtazapine  7.5 mg Oral QHS  . zinc sulfate  220 mg Oral Daily   Continuous Infusions:  LOS: 5 days     Berle Mull, MD  02/10/2020, 6:12 PM

## 2020-02-10 NOTE — Progress Notes (Signed)
   02/10/20 0130  Respiratory  Respiratory (WDL) X  Respiratory Pattern Irregular;Labored  Chest Assessment Chest expansion symmetrical  Bilateral Breath Sounds Clear;Diminished (Diminished anteriorly/posteriorly)  Pt with increased labor of breathing at rest.  HFNC increased to 7liters.

## 2020-02-11 ENCOUNTER — Ambulatory Visit: Payer: PRIVATE HEALTH INSURANCE | Admitting: Gastroenterology

## 2020-02-11 LAB — CBC WITH DIFFERENTIAL/PLATELET
Abs Immature Granulocytes: 0.23 10*3/uL — ABNORMAL HIGH (ref 0.00–0.07)
Basophils Absolute: 0 10*3/uL (ref 0.0–0.1)
Basophils Relative: 0 %
Eosinophils Absolute: 0 10*3/uL (ref 0.0–0.5)
Eosinophils Relative: 0 %
HCT: 34.3 % — ABNORMAL LOW (ref 36.0–46.0)
Hemoglobin: 11.4 g/dL — ABNORMAL LOW (ref 12.0–15.0)
Immature Granulocytes: 1 %
Lymphocytes Relative: 3 %
Lymphs Abs: 0.5 10*3/uL — ABNORMAL LOW (ref 0.7–4.0)
MCH: 28.8 pg (ref 26.0–34.0)
MCHC: 33.2 g/dL (ref 30.0–36.0)
MCV: 86.6 fL (ref 80.0–100.0)
Monocytes Absolute: 0.3 10*3/uL (ref 0.1–1.0)
Monocytes Relative: 1 %
Neutro Abs: 16.7 10*3/uL — ABNORMAL HIGH (ref 1.7–7.7)
Neutrophils Relative %: 95 %
Platelets: 250 10*3/uL (ref 150–400)
RBC: 3.96 MIL/uL (ref 3.87–5.11)
RDW: 13.5 % (ref 11.5–15.5)
WBC: 17.7 10*3/uL — ABNORMAL HIGH (ref 4.0–10.5)
nRBC: 0 % (ref 0.0–0.2)

## 2020-02-11 LAB — C-REACTIVE PROTEIN: CRP: 4.7 mg/dL — ABNORMAL HIGH (ref <1.0)

## 2020-02-11 LAB — COMPREHENSIVE METABOLIC PANEL
ALT: 23 U/L (ref 0–44)
AST: 21 U/L (ref 15–41)
Albumin: 2.7 g/dL — ABNORMAL LOW (ref 3.5–5.0)
Alkaline Phosphatase: 93 U/L (ref 38–126)
Anion gap: 13 (ref 5–15)
BUN: 16 mg/dL (ref 6–20)
CO2: 24 mmol/L (ref 22–32)
Calcium: 8.6 mg/dL — ABNORMAL LOW (ref 8.9–10.3)
Chloride: 101 mmol/L (ref 98–111)
Creatinine, Ser: 0.69 mg/dL (ref 0.44–1.00)
GFR, Estimated: >60 mL/min (ref >60)
Glucose, Bld: 199 mg/dL — ABNORMAL HIGH (ref 70–99)
Potassium: 3.8 mmol/L (ref 3.5–5.1)
Sodium: 138 mmol/L (ref 135–145)
Total Bilirubin: 0.8 mg/dL (ref 0.3–1.2)
Total Protein: 7.4 g/dL (ref 6.5–8.1)

## 2020-02-11 LAB — GLUCOSE, CAPILLARY
Glucose-Capillary: 198 mg/dL — ABNORMAL HIGH (ref 70–99)
Glucose-Capillary: 233 mg/dL — ABNORMAL HIGH (ref 70–99)
Glucose-Capillary: 190 mg/dL — ABNORMAL HIGH (ref 70–99)
Glucose-Capillary: 250 mg/dL — ABNORMAL HIGH (ref 70–99)

## 2020-02-11 LAB — MAGNESIUM: Magnesium: 2.3 mg/dL (ref 1.7–2.4)

## 2020-02-11 LAB — D-DIMER, QUANTITATIVE: D-Dimer, Quant: >20 ug/mL-FEU — ABNORMAL HIGH (ref 0.00–0.50)

## 2020-02-11 NOTE — Progress Notes (Addendum)
[facility] Progress Note  Patient: Christina Vincent    WIO:[id number]  DOA: 02/04/2020     Date of Service: the patient was seen and examined on 02/11/2020  Brief hospital course: Christina Royal Siddleis a 60 y.o.femalewith medical history significant ofdepression, dyslipidemia, type 2 diabetes, class I obesity, hypertension who is coming to the emergency department due to progressively worse shortness of breath for the past 6 days associated with fever, chills, fatigue, malaise, sore throat, occasionally productive of whitish/clear dry cough and rhinorrhea.Patient is unvaccinated and has been admitted for acute hypoxemic respiratory failure secondary to COVID-19  Currently plan is continue supportive care.  Assessment and Plan: 1.  Acute hypoxic respiratory failure, POA 83% admission on room air Acute COVID-19 Viral Pneumonia CXR: hazy bilateral peripheral opacities CT chest: GGO, consolidation Oxygen requirement: 15 LPM +15 LPM NRB 89%. CRP: 4.7 Remdesivir: Completed on 12/31 Steroids: Initially on Decadron, now on IV Solu-Medrol day 8 Baricitinib/Actemra(off-label use): Started on 12/29 The investigational nature of this medication was discussed with the patient/HCPOA and they choose to proceed as the potential benefits are felt to outweigh risks at this time.  Antibiotics: Not indicated Vitamin C and Zinc: Continue Prone positioning and incentive spirometer use recommended.  The treatment plan and use of medications and known side effects were discussed with patient/family. It was clearly explained that Complete risks and long-term side effects are unknown, however in the best clinical judgment they seem to be of some clinical benefit rather than medical risks. Patient/family agree with the treatment plan and want to receive these treatments as indicated.   2 -hypertension losartan to 25 mg daily.  3- type 2 diabetes with hyperglycemia -Continue Tradjenta, Levemir and sliding  scale insulin -Holding Metformin while inpatient -NovoLog for meal coverage has been added  4-hyperlipidemia/dyslipidemia -Holding statins with mild transaminitis during this admission -Heart healthy diet has been encouraged. -Follow LFTs.  5-class I obesity Body mass index is 33.15 kg/m.  -Low calorie diet, portion control increase physical activity discussed with patient.  6-depression ON REMERON  7.  D-dimer elevation. D-dimer has been elevated for last 4 days higher than detectable levels. Currently negative Doppler on 12/31 and a CT chest on 12/30. Recheck Dopplers now that the patient has more leg swelling.  Diet: Cardiac diet DVT Prophylaxis: Subcutaneous Lovenox    Advance goals of care discussion: Full code  Family Communication: no family was present at bedside, at the time of interview.  Discussed with husband on the phone. The pt provided permission to discuss medical plan with the family. Opportunity was given to ask question and all questions were answered satisfactorily.   Disposition:  Status is: Inpatient  Remains inpatient appropriate because:IV treatments appropriate due to intensity of illness or inability to take PO and Inpatient level of care appropriate due to severity of illness   Dispo: The patient is from: Home              Anticipated d/c is to: Home              Anticipated d/c date is: > 3 days              Patient currently is not medically stable to d/c.  Subjective: Continues to have cough and shortness of breath.  No nausea no vomiting.  Oral intake improving.  Headache improving.  Physical Exam:  General: Appear in mild distress, no Rash; Oral Mucosa Clear, moist. no Abnormal Neck Mass Or lumps, Conjunctiva normal  Cardiovascular:  S1 and S2 Present, no Murmur, Respiratory: increased respiratory effort, Bilateral Air entry present and bilateral  Crackles, no wheezes Abdomen: Bowel Sound present, Soft and no tenderness Extremities:  trace Pedal edema Neurology: alert and oriented to time, place, and person affect appropriate. no new focal deficit Gait not checked due to patient safety concerns    Vitals:   02/11/20 0523 02/11/20 1340 02/11/20 1350 02/11/20 2000  BP: 134/78 123/71  (!) 113/57  Pulse: 84 97  97  Resp: 14  (!) 23   Temp: 99.4 F (37.4 C) 98.1 F (36.7 C)  98.8 F (37.1 C)  TempSrc: Oral Oral  Oral  SpO2: 97% 91%  94%  Weight:      Height:        Intake/Output Summary (Last 24 hours) at 02/11/2020 2123 Last data filed at 02/11/2020 1700 Gross per 24 hour  Intake 720 ml  Output 300 ml  Net 420 ml   Filed Weights   02/04/20 1838  Weight: 98.9 kg    Data Reviewed: I have personally reviewed and interpreted daily labs, tele strips, imagings as discussed above. I reviewed all nursing notes, pharmacy notes, vitals, pertinent old records I have discussed plan of care as described above with RN and patient/family.  CBC: Recent Labs  Lab 02/06/20 0507 02/07/20 0447 02/08/20 0444 02/09/20 0512 02/11/20 0802  WBC 8.2 10.7* 10.2 14.7* 17.7*  NEUTROABS 7.1 9.2* 8.7* 13.3* 16.7*  HGB 11.8* 11.1* 11.2* 11.5* 11.4*  HCT 37.6 34.4* 35.4* 35.4* 34.3*  MCV 91.3 87.8 89.2 87.4 86.6  PLT 381 279 254 239 250   Basic Metabolic Panel: Recent Labs  Lab 02/04/20 2132 02/05/20 0348 02/06/20 0507 02/07/20 0447 02/08/20 0444 02/09/20 0512 02/11/20 0802  NA 134*   < > 136 136 138 137 138  K 3.2*   < > 4.0 4.0 3.6 3.9 3.8  CL 99   < > 100 100 103 99 101  CO2 25   < > 25 26 24 27 24   GLUCOSE 162*   < > 229* 207* 164* 200* 199*  BUN 12   < > 18 18 14 15 16   CREATININE 0.68   < > 0.74 0.67 0.71 0.65 0.69  CALCIUM 8.4*   < > 8.9 8.7* 8.4* 8.8* 8.6*  MG 1.7  --   --   --   --   --  2.3  PHOS 3.2  --   --   --   --   --   --    < > = values in this interval not displayed.    Studies: No results found.  Scheduled Meds: . albuterol  2 puff Inhalation Q6H  . vitamin C  500 mg Oral Daily  .  baricitinib  4 mg Oral Daily  . enoxaparin (LOVENOX) injection  50 mg Subcutaneous Q24H  . insulin aspart  0-20 Units Subcutaneous TID WC  . insulin aspart  6 Units Subcutaneous TID WC  . insulin detemir  0.15 Units/kg Subcutaneous BID  . linagliptin  5 mg Oral Daily  . losartan  25 mg Oral Daily  . methylPREDNISolone (SOLU-MEDROL) injection  60 mg Intravenous Q6H  . mirtazapine  7.5 mg Oral QHS  . zinc sulfate  220 mg Oral Daily   Continuous Infusions: PRN Meds: acetaminophen, ALPRAZolam, chlorpheniramine-HYDROcodone, guaiFENesin-dextromethorphan, lip balm, sodium chloride  Time spent: 35 minutes  Author: , MD [facility] 02/11/2020 9:23 PM  To reach On-call, see care teams to locate  the attending and reach out via [email or web address]. Between 7PM-7AM, please contact night-coverage If you still have difficulty reaching the attending provider, please page the [facility] (Director on Call) for [facility] on amion for assistance.

## 2020-02-11 NOTE — Plan of Care (Signed)

## 2020-02-11 NOTE — Plan of Care (Signed)
  Problem: Education: Goal: Knowledge of General Education information will improve Description: Including pain rating scale, medication(s)/side effects and non-pharmacologic comfort measures Outcome: Progressing   Problem: Health Behavior/Discharge Planning: Goal: Ability to manage health-related needs will improve Outcome: Progressing   Problem: Clinical Measurements: Goal: Ability to maintain clinical measurements within normal limits will improve Outcome: Progressing Goal: Will remain free from infection Outcome: Progressing Goal: Diagnostic test results will improve Outcome: Progressing Goal: Respiratory complications will improve Outcome: Progressing Goal: Cardiovascular complication will be avoided Outcome: Progressing   Problem: Activity: Goal: Risk for activity intolerance will decrease Outcome: Progressing   Problem: Nutrition: Goal: Adequate nutrition will be maintained Outcome: Progressing   Problem: Coping: Goal: Level of anxiety will decrease Outcome: Progressing   Problem: Elimination: Goal: Will not experience complications related to bowel motility Outcome: Progressing Goal: Will not experience complications related to urinary retention Outcome: Progressing   Problem: Pain Managment: Goal: General experience of comfort will improve Outcome: Progressing   Problem: Safety: Goal: Ability to remain free from injury will improve Outcome: Progressing   Problem: Skin Integrity: Goal: Risk for impaired skin integrity will decrease Outcome: Progressing   Problem: Education: Goal: Knowledge of risk factors and measures for prevention of condition will improve Outcome: Progressing   Problem: Coping: Goal: Psychosocial and spiritual needs will be supported Outcome: Progressing   Problem: Respiratory: Goal: Will maintain a patent airway Outcome: Progressing Goal: Complications related to the disease process, condition or treatment will be avoided or  minimized Outcome: Progressing   Problem: Education: Goal: Knowledge of General Education information will improve Description: Including pain rating scale, medication(s)/side effects and non-pharmacologic comfort measures Outcome: Progressing   Problem: Health Behavior/Discharge Planning: Goal: Ability to manage health-related needs will improve Outcome: Progressing   Problem: Clinical Measurements: Goal: Ability to maintain clinical measurements within normal limits will improve Outcome: Progressing Goal: Will remain free from infection Outcome: Progressing Goal: Diagnostic test results will improve Outcome: Progressing Goal: Respiratory complications will improve Outcome: Progressing Goal: Cardiovascular complication will be avoided Outcome: Progressing   Problem: Activity: Goal: Risk for activity intolerance will decrease Outcome: Progressing   Problem: Nutrition: Goal: Adequate nutrition will be maintained Outcome: Progressing   Problem: Coping: Goal: Level of anxiety will decrease Outcome: Progressing   Problem: Elimination: Goal: Will not experience complications related to bowel motility Outcome: Progressing Goal: Will not experience complications related to urinary retention Outcome: Progressing   Problem: Pain Managment: Goal: General experience of comfort will improve Outcome: Progressing   Problem: Safety: Goal: Ability to remain free from injury will improve Outcome: Progressing   Problem: Skin Integrity: Goal: Risk for impaired skin integrity will decrease Outcome: Progressing   Problem: Education: Goal: Knowledge of risk factors and measures for prevention of condition will improve Outcome: Progressing   Problem: Coping: Goal: Psychosocial and spiritual needs will be supported Outcome: Progressing   Problem: Respiratory: Goal: Will maintain a patent airway Outcome: Progressing Goal: Complications related to the disease process, condition  or treatment will be avoided or minimized Outcome: Progressing   

## 2020-02-11 NOTE — Progress Notes (Signed)
Inpatient Diabetes Program Recommendations  AACE/ADA: New Consensus Statement on Inpatient Glycemic Control (2015)  Target Ranges:  Prepandial:   less than 140 mg/dL      Peak postprandial:   less than 180 mg/dL (1-2 hours)      Critically ill patients:  140 - 180 mg/dL   Results for MYCHELLE, KENDRA (MRN [id number]) as of 02/11/2020 09:12  Ref. Range 02/10/2020 07:35 02/10/2020 11:31 02/10/2020 17:31 02/10/2020 21:33  Glucose-Capillary Latest Ref Range: 70 - 99 mg/dL 836 (H)  17 units NOVOLOG  256 (H)  17 units NOVOLOG  15 units LEVEMIR  229 (H)  13 units NOVOLOG  267 (H)     15 units LEVEMIR   Results for MYLISA, BRUNSON (MRN [id number]) as of 02/11/2020 09:12  Ref. Range 02/11/2020 08:15  Glucose-Capillary Latest Ref Range: 70 - 99 mg/dL 503 (H)  10 units NOVOLOG     Home DM Meds: Metformin 850 mg BID  Current Orders: Levemir 15 units BID      Novolog Resistant Correction Scale/ SSI (0-20 units) TID AC      Novolog 6 units TID with meals      Tradjenta 5 mg Daily      MD- Note patient Getting Solumedrol 60 mg Q6 hours.  CBGs remain >200.  Please consider while pt remains on steroids:  1. Increase Levemir to 18 units BID  2. Increase Novolog Meal Coverage to 8 units TID with meals    --Will follow patient during hospitalization--  Ambrose Finland RN, MSN, CDE Diabetes Coordinator Inpatient Glycemic Control Team Team Pager: [phone] (8a-5p)

## 2020-02-12 ENCOUNTER — Encounter (HOSPITAL_COMMUNITY): Payer: Self-pay | Admitting: Internal Medicine

## 2020-02-12 ENCOUNTER — Inpatient Hospital Stay (HOSPITAL_COMMUNITY): Payer: 59

## 2020-02-12 DIAGNOSIS — U071 COVID-19: Secondary | ICD-10-CM

## 2020-02-12 DIAGNOSIS — R7989 Other specified abnormal findings of blood chemistry: Secondary | ICD-10-CM

## 2020-02-12 LAB — COMPREHENSIVE METABOLIC PANEL
ALT: 22 U/L (ref 0–44)
AST: 19 U/L (ref 15–41)
Albumin: 2.7 g/dL — ABNORMAL LOW (ref 3.5–5.0)
Alkaline Phosphatase: 87 U/L (ref 38–126)
Anion gap: 13 (ref 5–15)
BUN: 17 mg/dL (ref 6–20)
CO2: 26 mmol/L (ref 22–32)
Calcium: 8.6 mg/dL — ABNORMAL LOW (ref 8.9–10.3)
Chloride: 99 mmol/L (ref 98–111)
Creatinine, Ser: 0.66 mg/dL (ref 0.44–1.00)
GFR, Estimated: >60 mL/min (ref >60)
Glucose, Bld: 216 mg/dL — ABNORMAL HIGH (ref 70–99)
Potassium: 4.2 mmol/L (ref 3.5–5.1)
Sodium: 138 mmol/L (ref 135–145)
Total Bilirubin: 0.6 mg/dL (ref 0.3–1.2)
Total Protein: 7.1 g/dL (ref 6.5–8.1)

## 2020-02-12 LAB — CBC WITH DIFFERENTIAL/PLATELET
Abs Immature Granulocytes: 0.25 10*3/uL — ABNORMAL HIGH (ref 0.00–0.07)
Basophils Absolute: 0 10*3/uL (ref 0.0–0.1)
Basophils Relative: 0 %
Eosinophils Absolute: 0 10*3/uL (ref 0.0–0.5)
Eosinophils Relative: 0 %
HCT: 35.7 % — ABNORMAL LOW (ref 36.0–46.0)
Hemoglobin: 11.6 g/dL — ABNORMAL LOW (ref 12.0–15.0)
Immature Granulocytes: 1 %
Lymphocytes Relative: 3 %
Lymphs Abs: 0.5 10*3/uL — ABNORMAL LOW (ref 0.7–4.0)
MCH: 28.2 pg (ref 26.0–34.0)
MCHC: 32.5 g/dL (ref 30.0–36.0)
MCV: 86.9 fL (ref 80.0–100.0)
Monocytes Absolute: 0.3 10*3/uL (ref 0.1–1.0)
Monocytes Relative: 2 %
Neutro Abs: 17 10*3/uL — ABNORMAL HIGH (ref 1.7–7.7)
Neutrophils Relative %: 94 %
Platelets: 213 10*3/uL (ref 150–400)
RBC: 4.11 MIL/uL (ref 3.87–5.11)
RDW: 13.6 % (ref 11.5–15.5)
WBC: 18.1 10*3/uL — ABNORMAL HIGH (ref 4.0–10.5)
nRBC: 0 % (ref 0.0–0.2)

## 2020-02-12 LAB — GLUCOSE, CAPILLARY
Glucose-Capillary: 205 mg/dL — ABNORMAL HIGH (ref 70–99)
Glucose-Capillary: 197 mg/dL — ABNORMAL HIGH (ref 70–99)
Glucose-Capillary: 153 mg/dL — ABNORMAL HIGH (ref 70–99)
Glucose-Capillary: 193 mg/dL — ABNORMAL HIGH (ref 70–99)

## 2020-02-12 LAB — D-DIMER, QUANTITATIVE: D-Dimer, Quant: >20 ug/mL-FEU — ABNORMAL HIGH (ref 0.00–0.50)

## 2020-02-12 LAB — MAGNESIUM: Magnesium: 2.5 mg/dL — ABNORMAL HIGH (ref 1.7–2.4)

## 2020-02-12 LAB — C-REACTIVE PROTEIN: CRP: 4.1 mg/dL — ABNORMAL HIGH (ref <1.0)

## 2020-02-12 MED ORDER — BENZONATATE 100 MG PO CAPS
200.0000 mg | ORAL_CAPSULE | Freq: Two times a day (BID) | ORAL | Status: DC
Start: 2020-02-12 — End: 2020-03-18
  Administered 2020-02-12 – 2020-03-18 (×67): 200 mg via ORAL
  Filled 2020-02-12 (×70): qty 2

## 2020-02-12 MED ORDER — IOHEXOL 350 MG/ML SOLN
100.0000 mL | Freq: Once | INTRAVENOUS | Status: AC | PRN
  Administered 2020-02-12: 80 mL via INTRAVENOUS

## 2020-02-12 MED ORDER — ENOXAPARIN SODIUM 100 MG/ML ~~LOC~~ SOLN
100.0000 mg | Freq: Two times a day (BID) | SUBCUTANEOUS | Status: DC
  Administered 2020-02-12 – 2020-02-18 (×12): 100 mg via SUBCUTANEOUS
  Filled 2020-02-12 (×12): qty 1

## 2020-02-12 MED ORDER — HYDROCOD POLST-CPM POLST ER 10-8 MG/5ML PO SUER
5.0000 mL | Freq: Every day | ORAL | Status: DC
  Administered 2020-02-12 – 2020-03-01 (×19): 5 mL via ORAL
  Filled 2020-02-12 (×19): qty 5

## 2020-02-12 NOTE — Progress Notes (Signed)
[facility] Progress Note  Patient: Christina Vincent    BSW:[id number]  DOA: 02/04/2020     Date of Service: the patient was seen and examined on 02/12/2020  Brief hospital course: Christina Poirier Siddleis a 60 y.o.femalewith medical history significant ofdepression, dyslipidemia, type 2 diabetes, class I obesity, hypertension who is coming to the emergency department due to progressively worse shortness of breath for the past 6 days associated with fever, chills, fatigue, malaise, sore throat, occasionally productive of whitish/clear dry cough and rhinorrhea.Patient is unvaccinated and has been admitted for acute hypoxemic respiratory failure secondary to COVID-19.  Currently plan is continue supportive care.  Assessment and Plan: 1.  Acute hypoxic respiratory failure, POA 83% admission on room air Acute COVID-19 Viral Pneumonia CXR: hazy bilateral peripheral opacities CT chest: GGO, consolidation also segmental pulmonary embolism Oxygen requirement: 15 LPM +15 LPM NRB 89%. CRP: 4.1 Remdesivir: Completed on 12/31 Steroids: Initially on Decadron, now on IV Solu-Medrol day 8 Baricitinib/Actemra(off-label use): Started on 12/29 The investigational nature of this medication was discussed with the patient/HCPOA and they choose to proceed as the potential benefits are felt to outweigh risks at this time.  Antibiotics: Not indicated Vitamin C and Zinc: Continue PE: CT scan on 1/4 shows evidence of segmental PE.  D-dimer elevated.  Lower extremity Doppler x2 so far negative.  Initiated on subcutaneous Lovenox.  Prone positioning and incentive spirometer use recommended.  The treatment plan and use of medications and known side effects were discussed with patient/family. It was clearly explained that Complete risks and long-term side effects are unknown, however in the best clinical judgment they seem to be of some clinical benefit rather than medical risks. Patient/family agree with the  treatment plan and want to receive these treatments as indicated.   2 -hypertension losartan to 25 mg daily.  3- type 2 diabetes with hyperglycemia -Continue Tradjenta, Levemir and sliding scale insulin -Holding Metformin while inpatient -NovoLog for meal coverage has been added  4-hyperlipidemia/dyslipidemia -Holding statins with mild transaminitis during this admission -Heart healthy diet has been encouraged. -Follow LFTs.  5-class I obesity Body mass index is 33.15 kg/m.  -Low calorie diet, portion control increase physical activity discussed with patient.  6-depression ON REMERON  Diet: Cardiac diet DVT Prophylaxis: Subcutaneous Lovenox    Advance goals of care discussion: Full code  Family Communication: no family was present at bedside, at the time of interview.  Discussed with husband on the phone on 02/11/2020. Unable to reach husband as well as daughters number 02/12/2020 unable to leave a voicemail.. The pt provided permission to discuss medical plan with the family. Opportunity was given to ask question and all questions were answered satisfactorily.   Disposition:  Status is: Inpatient  Remains inpatient appropriate because:IV treatments appropriate due to intensity of illness or inability to take PO and Inpatient level of care appropriate due to severity of illness   Dispo: The patient is from: Home              Anticipated d/c is to: Home              Anticipated d/c date is: > 3 days              Patient currently is not medically stable to d/c.  Subjective: Continues to have shortness of breath.  No nausea no vomiting.  Has dry cough.  Able to sleep okay last night.  Physical Exam:  General: Appear in mild distress, no Rash; Oral Mucosa  Clear, moist. no Abnormal Neck Mass Or lumps, Conjunctiva normal  Cardiovascular: S1 and S2 Present, no Murmur, Respiratory: increased respiratory effort, Bilateral Air entry present and bilateral  Crackles, no  wheezes Abdomen: Bowel Sound present, Soft and no tenderness Extremities: trace Pedal edema Neurology: alert and oriented to time, place, and person affect appropriate. no new focal deficit Gait not checked due to patient safety concerns  Vitals:   02/12/20 0911 02/12/20 1205 02/12/20 2010 02/12/20 2024  BP:  115/62  119/83  Pulse:  98 78 84  Resp:  (!) 24 (!) 22 20  Temp:  98.3 F (36.8 C)  98.4 F (36.9 C)  TempSrc:  Oral  Oral  SpO2: 90% 96% (!) 89% 92%  Weight:      Height:        Intake/Output Summary (Last 24 hours) at 02/12/2020 2039 Last data filed at 02/12/2020 0900 Gross per 24 hour  Intake 240 ml  Output 600 ml  Net -360 ml   Filed Weights   02/04/20 1838  Weight: 98.9 kg    Data Reviewed: I have personally reviewed and interpreted daily labs, tele strips, imagings as discussed above. I reviewed all nursing notes, pharmacy notes, vitals, pertinent old records I have discussed plan of care as described above with RN and patient/family.  CBC: Recent Labs  Lab 02/07/20 0447 02/08/20 0444 02/09/20 0512 02/11/20 0802 02/12/20 0421  WBC 10.7* 10.2 14.7* 17.7* 18.1*  NEUTROABS 9.2* 8.7* 13.3* 16.7* 17.0*  HGB 11.1* 11.2* 11.5* 11.4* 11.6*  HCT 34.4* 35.4* 35.4* 34.3* 35.7*  MCV 87.8 89.2 87.4 86.6 86.9  PLT 279 254 239 250 213   Basic Metabolic Panel: Recent Labs  Lab 02/07/20 0447 02/08/20 0444 02/09/20 0512 02/11/20 0802 02/12/20 0421  NA 136 138 137 138 138  K 4.0 3.6 3.9 3.8 4.2  CL 100 103 99 101 99  CO2 26 24 27 24 26   GLUCOSE 207* 164* 200* 199* 216*  BUN 18 14 15 16 17   CREATININE 0.67 0.71 0.65 0.69 0.66  CALCIUM 8.7* 8.4* 8.8* 8.6* 8.6*  MG  --   --   --  2.3 2.5*    Studies: CT ANGIO CHEST PE W OR WO CONTRAST  Result Date: 02/12/2020 CLINICAL DATA:  COVID positive, shortness of breath EXAM: CT ANGIOGRAPHY CHEST WITH CONTRAST TECHNIQUE: Multidetector CT imaging of the chest was performed using the standard protocol during bolus  administration of intravenous contrast. Multiplanar CT image reconstructions and MIPs were obtained to evaluate the vascular anatomy. CONTRAST:  71mL OMNIPAQUE IOHEXOL 350 MG/ML SOLN COMPARISON:  02/07/2020 FINDINGS: Cardiovascular: Satisfactory opacification of the pulmonary arteries to the segmental level. Filling defects are present within bilateral lower lobe and right middle lobe segmental branches. Normal heart size without evidence of right heart strain. No pericardial effusion. Mediastinum/Nodes: Likely reactive mildly prominent nodes are present. Thyroid and esophagus are unremarkable. Lungs/Pleura: Bilateral ground-glass and consolidative opacities are again identified with worsening compared to the prior chest CT. No pleural effusion or pneumothorax. Upper Abdomen: Small hiatal hernia. Stable indeterminate 1.6 cm left adrenal nodule. Musculoskeletal: No acute osseous abnormality. Review of the MIP images confirms the above findings. IMPRESSION: Acute bilateral lower lobe and right middle lobe segmental pulmonary emboli. No evidence of right heart strain. Multifocal pneumonia with worsening of aeration since 02/07/2020. Indeterminate 1.6 cm left adrenal nodule. Small hiatal hernia. These results were called by telephone at the time of interpretation on 02/12/2020 at 5:08 pm to provider Finneus Kaneshiro ,  who verbally acknowledged these results. Electronically Signed   By: Macy Mis M.D.   On: 02/12/2020 17:08   VAS [place] LOWER EXTREMITY VENOUS (DVT)  Result Date: 02/12/2020  Lower Venous DVT Study Other Indications: Covid, D-Dimer. Comparison Study: Previous 02/08/2020 Negative Performing Technologist: Vonzell Schlatter RVT  Examination Guidelines: A complete evaluation includes B-mode imaging, spectral Doppler, color Doppler, and power Doppler as needed of all accessible portions of each vessel. Bilateral testing is considered an integral part of a complete examination. Limited examinations for reoccurring  indications may be performed as noted. The reflux portion of the exam is performed with the patient in reverse Trendelenburg.  +---------+---------------+---------+-----------+----------+--------------+ RIGHT    CompressibilityPhasicitySpontaneityPropertiesThrombus Aging +---------+---------------+---------+-----------+----------+--------------+ CFV      Full           Yes      Yes                                 +---------+---------------+---------+-----------+----------+--------------+ SFJ      Full                                                        +---------+---------------+---------+-----------+----------+--------------+ FV Prox  Full                                                        +---------+---------------+---------+-----------+----------+--------------+ FV Mid   Full                                                        +---------+---------------+---------+-----------+----------+--------------+ FV DistalFull                                                        +---------+---------------+---------+-----------+----------+--------------+ PFV      Full                                                        +---------+---------------+---------+-----------+----------+--------------+ POP      Full           Yes      Yes                                 +---------+---------------+---------+-----------+----------+--------------+ PTV      Full                                                        +---------+---------------+---------+-----------+----------+--------------+  PERO     Full                                                        +---------+---------------+---------+-----------+----------+--------------+   +---------+---------------+---------+-----------+----------+--------------+ LEFT     CompressibilityPhasicitySpontaneityPropertiesThrombus Aging  +---------+---------------+---------+-----------+----------+--------------+ CFV      Full           Yes      Yes                                 +---------+---------------+---------+-----------+----------+--------------+ SFJ      Full                                                        +---------+---------------+---------+-----------+----------+--------------+ FV Prox  Full                                                        +---------+---------------+---------+-----------+----------+--------------+ FV Mid   Full                                                        +---------+---------------+---------+-----------+----------+--------------+ FV DistalFull                                                        +---------+---------------+---------+-----------+----------+--------------+ PFV      Full                                                        +---------+---------------+---------+-----------+----------+--------------+ POP      Full           Yes      Yes                                 +---------+---------------+---------+-----------+----------+--------------+ PTV      Full                                                        +---------+---------------+---------+-----------+----------+--------------+ PERO     Full                                                        +---------+---------------+---------+-----------+----------+--------------+  Summary: RIGHT: - There is no evidence of deep vein thrombosis in the lower extremity.  - No cystic structure found in the popliteal fossa.  LEFT: - There is no evidence of deep vein thrombosis in the lower extremity.  - No cystic structure found in the popliteal fossa.  *See table(s) above for measurements and observations. Electronically signed by Gretta Began MD on 02/12/2020 at 2:02:15 PM.    Final     Scheduled Meds: . albuterol  2 puff Inhalation Q6H  . vitamin C  500 mg Oral Daily  .  baricitinib  4 mg Oral Daily  . benzonatate  200 mg Oral BID  . chlorpheniramine-HYDROcodone  5 mL Oral QHS  . enoxaparin (LOVENOX) injection  100 mg Subcutaneous Q12H  . insulin aspart  0-20 Units Subcutaneous TID WC  . insulin aspart  6 Units Subcutaneous TID WC  . insulin detemir  0.15 Units/kg Subcutaneous BID  . linagliptin  5 mg Oral Daily  . methylPREDNISolone (SOLU-MEDROL) injection  60 mg Intravenous Q6H  . mirtazapine  7.5 mg Oral QHS  . zinc sulfate  220 mg Oral Daily   Continuous Infusions: PRN Meds: acetaminophen, ALPRAZolam, chlorpheniramine-HYDROcodone, guaiFENesin-dextromethorphan, lip balm, sodium chloride  Time spent: 35 minutes  Author: Lynden Oxford, MD [facility] 02/12/2020 8:39 PM  To reach On-call, see care teams to locate the attending and reach out via [email or web address]. Between 7PM-7AM, please contact night-coverage If you still have difficulty reaching the attending provider, please page the [facility] (Director on Call) for [facility] on amion for assistance.

## 2020-02-12 NOTE — Progress Notes (Signed)
ANTICOAGULATION CONSULT NOTE - Initial Consult  Pharmacy Consult for Lovenox Indication: pulmonary embolus  No Known Allergies  Patient Measurements: Height: 5\' 8"  (172.7 cm) Weight: 98.9 kg (218 lb) IBW/kg (Calculated) : 63.9  Vital Signs: Temp: 98.3 F (36.8 C) (01/04 1205) Temp Source: Oral (01/04 1205) BP: 115/62 (01/04 1205) Pulse Rate: 98 (01/04 1205)  Labs: Recent Labs    02/11/20 0802 02/12/20 0421  HGB 11.4* 11.6*  HCT 34.3* 35.7*  PLT 250 213  CREATININE 0.69 0.66    Estimated Creatinine Clearance: 95.4 mL/min (by C-G formula based on SCr of 0.66 mg/dL).   Medical History: Past Medical History:  Diagnosis Date  . Depression   . Dyslipidemia     Assessment: 60 yo F admitted on 12/27 with COVID PNA.  She has been on Lovenox since admission for VTE px.  Dose was briefly increased to 1mg /kg q12h x2 doses on 12/30 & 31 due to spike in DDimer but reduced back to px dose when dopplers were negative for DVT.   Chest CTa today + bilateral PE without evidence of right heart strain.  Repeat dopplers remain negative for DVT.   Last dose Lovenox 50mg  given at 0551. CBC: Hg 11.6- slightly low but stable, pltc WNL.   Renal function stable, est CrCl>46ml/min.   Goal of Therapy:  Anti-Xa level 0.6-1 units/ml 4hrs after LMWH dose given Monitor platelets by anticoagulation protocol: Yes   Plan:  Lovenox 100mg  SQ q12h Monitor CBC at least q3days  1/31 PharmD, BCPS 02/12/2020,5:26 PM

## 2020-02-12 NOTE — Plan of Care (Signed)

## 2020-02-12 NOTE — Progress Notes (Signed)
Inpatient Diabetes Program Recommendations  AACE/ADA: New Consensus Statement on Inpatient Glycemic Control (2015)  Target Ranges:  Prepandial:   less than 140 mg/dL      Peak postprandial:   less than 180 mg/dL (1-2 hours)      Critically ill patients:  140 - 180 mg/dL   Results for Christina Vincent, Christina Vincent (MRN [id number]) as of 02/12/2020 08:53  Ref. Range 02/11/2020 08:15 02/11/2020 11:29 02/11/2020 16:40 02/11/2020 20:50  Glucose-Capillary Latest Ref Range: 70 - 99 mg/dL 824 (H)  10 units NOVOLOG  233 (H)  13 units NOVOLOG  15 units LEVEMIR  190 (H)  10 units NOVOLOG  250 (H)     15 units LEVEMIR   Results for Christina Vincent, Christina Vincent (MRN [id number]) as of 02/12/2020 08:53  Ref. Range 02/12/2020 07:35  Glucose-Capillary Latest Ref Range: 70 - 99 mg/dL 154 (H)  13 units NOVOLOG  15 units LEVEMIR     Home DM Meds: Metformin 850 mg BID  Current Orders: Levemir 15 units BID                            Novolog Resistant Correction Scale/ SSI (0-20 units) TID AC                            Novolog 6 units TID with meals                            Tradjenta 5 mg Daily      MD- Note patient Getting Solumedrol 60 mg Q6 hours.  CBGs remain >200.  Please consider while pt remains on steroids:  1. Increase Levemir to 18 units BID  2. Increase Novolog Meal Coverage to 8 units TID with meals    --Will follow patient during hospitalization--  Ambrose Finland RN, MSN, CDE Diabetes Coordinator Inpatient Glycemic Control Team Team Pager: [phone] (8a-5p)

## 2020-02-12 NOTE — Progress Notes (Signed)
Bilateral lower extremity venous study completed.      Please see CV Proc for preliminary results.   Jacori Mulrooney, RVT  

## 2020-02-13 DIAGNOSIS — E785 Hyperlipidemia, unspecified: Secondary | ICD-10-CM

## 2020-02-13 DIAGNOSIS — F32A Depression, unspecified: Secondary | ICD-10-CM

## 2020-02-13 DIAGNOSIS — I10 Essential (primary) hypertension: Secondary | ICD-10-CM

## 2020-02-13 DIAGNOSIS — E1165 Type 2 diabetes mellitus with hyperglycemia: Secondary | ICD-10-CM

## 2020-02-13 DIAGNOSIS — E669 Obesity, unspecified: Secondary | ICD-10-CM

## 2020-02-13 LAB — D-DIMER, QUANTITATIVE: D-Dimer, Quant: >20 ug/mL-FEU — ABNORMAL HIGH (ref 0.00–0.50)

## 2020-02-13 LAB — CBC WITH DIFFERENTIAL/PLATELET
Abs Immature Granulocytes: 0.39 10*3/uL — ABNORMAL HIGH (ref 0.00–0.07)
Basophils Absolute: 0 10*3/uL (ref 0.0–0.1)
Basophils Relative: 0 %
Eosinophils Absolute: 0 10*3/uL (ref 0.0–0.5)
Eosinophils Relative: 0 %
HCT: 37.4 % (ref 36.0–46.0)
Hemoglobin: 12.1 g/dL (ref 12.0–15.0)
Immature Granulocytes: 2 %
Lymphocytes Relative: 2 %
Lymphs Abs: 0.6 10*3/uL — ABNORMAL LOW (ref 0.7–4.0)
MCH: 28.2 pg (ref 26.0–34.0)
MCHC: 32.4 g/dL (ref 30.0–36.0)
MCV: 87.2 fL (ref 80.0–100.0)
Monocytes Absolute: 0.3 10*3/uL (ref 0.1–1.0)
Monocytes Relative: 1 %
Neutro Abs: 21.7 10*3/uL — ABNORMAL HIGH (ref 1.7–7.7)
Neutrophils Relative %: 95 %
Platelets: 292 10*3/uL (ref 150–400)
RBC: 4.29 MIL/uL (ref 3.87–5.11)
RDW: 13.7 % (ref 11.5–15.5)
WBC: 23 10*3/uL — ABNORMAL HIGH (ref 4.0–10.5)
nRBC: 0 % (ref 0.0–0.2)

## 2020-02-13 LAB — COMPREHENSIVE METABOLIC PANEL
ALT: 29 U/L (ref 0–44)
AST: 22 U/L (ref 15–41)
Albumin: 2.9 g/dL — ABNORMAL LOW (ref 3.5–5.0)
Alkaline Phosphatase: 102 U/L (ref 38–126)
Anion gap: 13 (ref 5–15)
BUN: 17 mg/dL (ref 6–20)
CO2: 26 mmol/L (ref 22–32)
Calcium: 8.5 mg/dL — ABNORMAL LOW (ref 8.9–10.3)
Chloride: 98 mmol/L (ref 98–111)
Creatinine, Ser: 0.56 mg/dL (ref 0.44–1.00)
GFR, Estimated: >60 mL/min (ref >60)
Glucose, Bld: 230 mg/dL — ABNORMAL HIGH (ref 70–99)
Potassium: 4.3 mmol/L (ref 3.5–5.1)
Sodium: 137 mmol/L (ref 135–145)
Total Bilirubin: 0.9 mg/dL (ref 0.3–1.2)
Total Protein: 7.6 g/dL (ref 6.5–8.1)

## 2020-02-13 LAB — GLUCOSE, CAPILLARY
Glucose-Capillary: 222 mg/dL — ABNORMAL HIGH (ref 70–99)
Glucose-Capillary: 274 mg/dL — ABNORMAL HIGH (ref 70–99)
Glucose-Capillary: 190 mg/dL — ABNORMAL HIGH (ref 70–99)
Glucose-Capillary: 127 mg/dL — ABNORMAL HIGH (ref 70–99)

## 2020-02-13 LAB — MAGNESIUM: Magnesium: 2.7 mg/dL — ABNORMAL HIGH (ref 1.7–2.4)

## 2020-02-13 LAB — C-REACTIVE PROTEIN: CRP: 1.9 mg/dL — ABNORMAL HIGH (ref <1.0)

## 2020-02-13 MED ORDER — INSULIN DETEMIR 100 UNIT/ML ~~LOC~~ SOLN
20.0000 [IU] | Freq: Two times a day (BID) | SUBCUTANEOUS | Status: DC
  Administered 2020-02-13 – 2020-02-18 (×10): 20 [IU] via SUBCUTANEOUS
  Filled 2020-02-13 (×10): qty 0.2

## 2020-02-13 MED ORDER — INSULIN ASPART 100 UNIT/ML ~~LOC~~ SOLN
8.0000 [IU] | Freq: Three times a day (TID) | SUBCUTANEOUS | Status: DC
  Administered 2020-02-14 – 2020-02-15 (×4): 8 [IU] via SUBCUTANEOUS

## 2020-02-13 NOTE — Evaluation (Signed)
Physical Therapy Evaluation Patient Details Name: Christina Vincent MRN: [id number] DOB: [birth year] Today's Date: 02/13/2020   History of Present Illness  60 y.o. female with medical history significant of depression, dyslipidemia, type 2 diabetes, class I obesity, hypertension who is coming to the emergency department due to progressively worse shortness of breath for the past 6 days associated with fever, chills, fatigue, malaise, sore throat, occasionally productive of whitish/clear dry cough and rhinorrhea.  Patient is unvaccinated and has been admitted for acute hypoxemic respiratory failure secondary to COVID-19.  Pt also found to have PE 02/12/20  Clinical Impression  Pt admitted with above diagnosis.  Pt currently with functional limitations due to the deficits listed below (see PT Problem List). Pt will benefit from skilled PT to increase their independence and safety with mobility to allow discharge to the venue listed below.  Pt reports she has been requiring high amount of oxygen however was ambulatory in room. Pt states PE has set her back however as she was only able to tolerate OOB for use of BSC during session.  Pt currently on 15L salter HFNC and 15L NRB.  SPO2 dropped to 50% with transfer, improved to 70% upon return to bed and 91% upon leaving room.  Pt encouraged to prone and left in semiprone position with pillow upon therapist leaving room.  Pt very pleasant and trying to cooperate however dyspnea and fatigue along with decreased saturations limiting mobility today.      Follow Up Recommendations Home health PT    Equipment Recommendations  Rolling walker with 5" wheels (to be further determined, limited mobility today)    Recommendations for Other Services       Precautions / Restrictions Precautions Precautions: Fall Precaution Comments: monitor sats      Mobility  Bed Mobility Overal bed mobility: Needs Assistance Bed Mobility: Supine to Sit;Sit to Supine     Supine  to sit: Supervision Sit to supine: Supervision   General bed mobility comments: supervision for safety; poor eccentric control due to fatigue    Transfers Overall transfer level: Needs assistance Equipment used: None Transfers: Sit to/from Stand;Stand Pivot Transfers Sit to Stand: Min guard Stand pivot transfers: Min guard       General transfer comment: min/guard for safety, requiring UE support with armrests today, pt requested using BSC; HR up 120s and SPO2 dropped to 50%, cues for rest break upon sitting and after pt performed pericare, SpO2 70% upon return to bed (RN aware)  Ambulation/Gait                Stairs            Wheelchair Mobility    Modified Rankin (Stroke Patients Only)       Balance                                             Pertinent Vitals/Pain Pain Assessment: No/denies pain    Home Living Family/patient expects to be discharged to:: Private residence Living Arrangements: Spouse/significant other;Children Available Help at Discharge: Family Type of Home: House       Home Layout: Two level Home Equipment: None      Prior Function Level of Independence: Independent               Hand Dominance        Extremity/Trunk Assessment  Lower Extremity Assessment Lower Extremity Assessment: Generalized weakness    Cervical / Trunk Assessment Cervical / Trunk Assessment: Normal  Communication   Communication: No difficulties  Cognition Arousal/Alertness: Awake/alert Behavior During Therapy: WFL for tasks assessed/performed Overall Cognitive Status: Within Functional Limits for tasks assessed                                        General Comments      Exercises     Assessment/Plan    PT Assessment Patient needs continued PT services  PT Problem List Decreased mobility;Decreased activity tolerance;Cardiopulmonary status limiting activity;Decreased knowledge of use of  DME;Decreased strength       PT Treatment Interventions Gait training;DME instruction;Therapeutic exercise;Balance training;Functional mobility training;Therapeutic activities;Patient/family education    PT Goals (Current goals can be found in the Care Plan section)  Acute Rehab PT Goals PT Goal Formulation: With patient Time For Goal Achievement: 02/27/20 Potential to Achieve Goals: Fair    Frequency Min 3X/week   Barriers to discharge        Co-evaluation               AM-PAC PT "6 Clicks" Mobility  Outcome Measure Help needed turning from your back to your side while in a flat bed without using bedrails?: A Little Help needed moving from lying on your back to sitting on the side of a flat bed without using bedrails?: A Little Help needed moving to and from a bed to a chair (including a wheelchair)?: A Little Help needed standing up from a chair using your arms (e.g., wheelchair or bedside chair)?: A Little Help needed to walk in hospital room?: A Little Help needed climbing 3-5 steps with a railing? : A Lot 6 Click Score: 17    End of Session Equipment Utilized During Treatment: Oxygen Activity Tolerance: Patient tolerated treatment well;Patient limited by fatigue Patient left: in bed;with call bell/phone within reach;with bed alarm set Nurse Communication: Mobility status PT Visit Diagnosis: Difficulty in walking, not elsewhere classified (R26.2)    Time: 6384-6659 PT Time Calculation (min) (ACUTE ONLY): 17 min   Charges:   PT Evaluation $PT Eval Low Complexity: 1 Low     Kati PT, DPT Acute Rehabilitation Services Pager: [phone] Office: [phone]  Maida Sale E 02/13/2020, 1:52 PM

## 2020-02-13 NOTE — Progress Notes (Signed)
Spoke to husband and daughter of patient. Daughter upset regarding not having a status update. Assigned nurse spoke to daughter however still requested charge nurse. Gave a brief update of patient status. Made aware that attending will make every attempt to follow up with additional updates at the end of business today.

## 2020-02-13 NOTE — TOC Progression Note (Signed)
Transition of Care [facility]) - Progression Note    Patient Details  Name: Christina Vincent MRN: [id number] Date of Birth: 1961-01-08  [facility]) CM/SW Contact  Geni Bers, RN Phone Number: 02/13/2020, 4:28 PM  Clinical Narrative:     Pt will need OP PT related to insurance.        Expected Discharge Plan and Services                                                 Social Determinants of Health (SDOH) Interventions    Readmission Risk Interventions No flowsheet data found.

## 2020-02-13 NOTE — Progress Notes (Signed)
Inpatient Diabetes Program Recommendations  AACE/ADA: New Consensus Statement on Inpatient Glycemic Control (2015)  Target Ranges:  Prepandial:   less than 140 mg/dL      Peak postprandial:   less than 180 mg/dL (1-2 hours)      Critically ill patients:  140 - 180 mg/dL   Lab Results  Component Value Date   GLUCAP 274 (H) 02/13/2020    Review of Glycemic Control Results for Christina Vincent, Christina Vincent (MRN [id number]) as of 02/13/2020 12:43  Ref. Range 02/12/2020 17:05 02/12/2020 20:22 02/13/2020 08:17 02/13/2020 11:42  Glucose-Capillary Latest Ref Range: 70 - 99 mg/dL 096 (H) 438 (H) 381 (H) 274 (H)   Diabetes history: Type 2 DM Outpatient Diabetes medications: Metformin 850 mg BID Current orders for Inpatient glycemic control: Novolog 6 units TID, Tradjenta 5 mg QD, Novolog 0-20 units TID, Levemir 15 units BID Solumedrol 60 mg Q6H  Inpatient Diabetes Program Recommendations:    Consider increasing Levemir to 18 units BID.  Thanks, Lujean Rave, MSN, RNC-OB Diabetes Coordinator [phone] (8a-5p)

## 2020-02-13 NOTE — Progress Notes (Signed)
PROGRESS NOTE  Christina Vincent  XNA:[id number] DOB: 02-09-1960 DOA: 02/04/2020 PCP: Avon Gully, MD   Brief Narrative: Christina Vincent is a 60 y.o. female with a history of T2DM, HTN, HLD, obesity and depression who presented with fever and dyspnea found to have positive SARS-CoV-2 PCR and mild hypoxia despite clear CXR on 12/27. CRP 16.6, PCT negative.   Assessment & Plan: Principal Problem:   2019 novel coronavirus disease (COVID-19) Active Problems:   Hypertension   Type 2 diabetes mellitus (HCC)   Dyslipidemia   Depression   Class 1 obesity   Acute hypoxemic respiratory failure due to COVID-19 [facility])  Acute hypoxemic respiratory failure due to covid-19 pneumonia: SARS-CoV-2 positive on 02/04/2020.  - Completed remdesivir - Continue steroids - Continue baricitinib - Encourage OOB, IS, FV, and awake proning if able - Continue airborne, contact precautions for 21 days from positive testing. - Monitor CMP and inflammatory markers - Enoxaparin prophylactic dose.   Segmental PE: No Right heart strain. - Continue lovenox 1mg /kg q12h  T2DM with steroid-induced hyperglycemia: - Check HbA1c - Augment levemir to 20u BID, increase 8u mealtime TIDWC. Continue linagliptin  HTN:  - Continue losartan  LFT elevation:  - Holding statin  Depression:  - Continue remeron.  Obesity: Estimated body mass index is 33.15 kg/m as calculated from the following:   Height as of this encounter: 5\' 8"  (1.727 m).   Weight as of this encounter: 98.9 kg.  DVT prophylaxis: Lovenox Code Status: Full Family Communication: Husband by phone per pt request Disposition Plan:  Status is: Inpatient  Remains inpatient appropriate because:Inpatient level of care appropriate due to severity of illness  Dispo:  Patient From: Home  Planned Disposition: Home with Health Care Svc  Expected discharge date: 02/19/2020  Medically stable for discharge: No  Consultants:   None  Procedures:    None  Antimicrobials:  Remdesivir   Subjective: Feels stronger today but still severely short of breath at rest, worse with exertion. She's been using IS consistently and doing bed-level exercises.  Objective: Vitals:   02/13/20 0427 02/13/20 0858 02/13/20 1149 02/13/20 1447  BP: (!) 120/59  127/69   Pulse: 73 77 (!) 109 (!) 108  Resp: (!) 24 20 20  (!) 22  Temp: 99.2 F (37.3 C)  97.7 F (36.5 C)   TempSrc: Oral  Oral   SpO2: 93% 94% 93% 92%  Weight:      Height:        Intake/Output Summary (Last 24 hours) at 02/13/2020 1835 Last data filed at 02/13/2020 1530 Gross per 24 hour  Intake 840 ml  Output 500 ml  Net 340 ml   Filed Weights   02/04/20 1838  Weight: 98.9 kg   Gen: 60 y.o. female in no distress. Pulm: Non-labored breathing but tachypneic with crackles and good aeration.  CV: Regular rate and rhythm. No murmur, rub, or gallop. No JVD, no pedal edema. GI: Abdomen soft, non-tender, non-distended, with normoactive bowel sounds. No organomegaly or masses felt. Ext: Warm, no deformities Skin: No rashes, lesions or ulcers Neuro: Alert and oriented. No focal neurological deficits. Psych: Judgement and insight appear normal. Mood & affect appropriate.   Data Reviewed: I have personally reviewed following labs and imaging studies  CBC: Recent Labs  Lab 02/08/20 0444 02/09/20 0512 02/11/20 0802 02/12/20 0421 02/13/20 0651  WBC 10.2 14.7* 17.7* 18.1* 23.0*  NEUTROABS 8.7* 13.3* 16.7* 17.0* 21.7*  HGB 11.2* 11.5* 11.4* 11.6* 12.1  HCT 35.4* 35.4* 34.3* 35.7*  37.4  MCV 89.2 87.4 86.6 86.9 87.2  PLT 254 239 250 213 292   Basic Metabolic Panel: Recent Labs  Lab 02/08/20 0444 02/09/20 0512 02/11/20 0802 02/12/20 0421 02/13/20 0651  NA 138 137 138 138 137  K 3.6 3.9 3.8 4.2 4.3  CL 103 99 101 99 98  CO2 24 27 24 26 26   GLUCOSE 164* 200* 199* 216* 230*  BUN 14 15 16 17 17   CREATININE 0.71 0.65 0.69 0.66 0.56  CALCIUM 8.4* 8.8* 8.6* 8.6* 8.5*  MG  --    --  2.3 2.5* 2.7*   GFR: Estimated Creatinine Clearance: 95.4 mL/min (by C-G formula based on SCr of 0.56 mg/dL). Liver Function Tests: Recent Labs  Lab 02/08/20 0444 02/09/20 0512 02/11/20 0802 02/12/20 0421 02/13/20 0651  AST 38 34 21 19 22   ALT 28 26 23 22 29   ALKPHOS 67 84 93 87 102  BILITOT 1.0 0.6 0.8 0.6 0.9  PROT 7.2 7.8 7.4 7.1 7.6  ALBUMIN 2.8* 2.9* 2.7* 2.7* 2.9*   No results for input(s): LIPASE, AMYLASE in the last 168 hours. No results for input(s): AMMONIA in the last 168 hours. Coagulation Profile: No results for input(s): INR, PROTIME in the last 168 hours. Cardiac Enzymes: No results for input(s): CKTOTAL, CKMB, CKMBINDEX, TROPONINI in the last 168 hours. BNP (last 3 results) No results for input(s): PROBNP in the last 8760 hours. HbA1C: No results for input(s): HGBA1C in the last 72 hours. CBG: Recent Labs  Lab 02/12/20 1705 02/12/20 2022 02/13/20 0817 02/13/20 1142 02/13/20 1656  GLUCAP 153* 193* 222* 274* 190*   Lipid Profile: No results for input(s): CHOL, HDL, LDLCALC, TRIG, CHOLHDL, LDLDIRECT in the last 72 hours. Thyroid Function Tests: No results for input(s): TSH, T4TOTAL, FREET4, T3FREE, THYROIDAB in the last 72 hours. Anemia Panel: No results for input(s): VITAMINB12, FOLATE, FERRITIN, TIBC, IRON, RETICCTPCT in the last 72 hours. Urine analysis: No results found for: COLORURINE, APPEARANCEUR, LABSPEC, PHURINE, GLUCOSEU, HGBUR, BILIRUBINUR, KETONESUR, PROTEINUR, UROBILINOGEN, NITRITE, LEUKOCYTESUR Recent Results (from the past 240 hour(s))  Resp Panel by RT-PCR (Flu A&B, Covid) Nasopharyngeal Swab     Status: Abnormal   Collection Time: 02/04/20  6:40 PM   Specimen: Nasopharyngeal Swab; Nasopharyngeal(NP) swabs in vial transport medium  Result Value Ref Range Status   SARS Coronavirus 2 by RT PCR POSITIVE (A) NEGATIVE Final    Comment: RESULT CALLED TO, READ BACK BY AND VERIFIED WITH: OAKLEY,B AT 1958 ON 02/04/2020 BY  MOSLEY,J (NOTE) SARS-CoV-2 target nucleic acids are DETECTED.  The SARS-CoV-2 RNA is generally detectable in upper respiratory specimens during the acute phase of infection. Positive results are indicative of the presence of the identified virus, but do not rule out bacterial infection or co-infection with other pathogens not detected by the test. Clinical correlation with patient history and other diagnostic information is necessary to determine patient infection status. The expected result is Negative.  Fact Sheet for Patients: 04/12/20  Fact Sheet for Healthcare Providers: 04/12/20  This test is not yet approved or cleared by the 02/06/20 FDA and  has been authorized for detection and/or diagnosis of SARS-CoV-2 by FDA under an Emergency Use Authorization (EUA).  This EUA will remain in effect (meaning this te st can be used) for the duration of  the COVID-19 declaration under Section 564(b)(1) of the Act, 21 U.S.C. section 360bbb-3(b)(1), unless the authorization is terminated or revoked sooner.     Influenza A by PCR NEGATIVE NEGATIVE Final  Influenza B by PCR NEGATIVE NEGATIVE Final    Comment: (NOTE) The Xpert Xpress SARS-CoV-2/FLU/RSV plus assay is intended as an aid in the diagnosis of influenza from Nasopharyngeal swab specimens and should not be used as a sole basis for treatment. Nasal washings and aspirates are unacceptable for Xpert Xpress SARS-CoV-2/FLU/RSV testing.  Fact Sheet for Patients: BloggerCourse.com  Fact Sheet for Healthcare Providers: SeriousBroker.it  This test is not yet approved or cleared by the [place] FDA and has been authorized for detection and/or diagnosis of SARS-CoV-2 by FDA under an Emergency Use Authorization (EUA). This EUA will remain in effect (meaning this test can be used) for the duration of  the COVID-19 declaration under Section 564(b)(1) of the Act, 21 U.S.C. section 360bbb-3(b)(1), unless the authorization is terminated or revoked.  Performed at [facility], [address] [place], [place] [address]   Blood Culture (routine x 2)     Status: None   Collection Time: 02/04/20  9:32 PM   Specimen: BLOOD RIGHT ARM  Result Value Ref Range Status   Specimen Description BLOOD RIGHT ARM  Final   Special Requests   Final    BOTTLES DRAWN AEROBIC AND ANAEROBIC Blood Culture adequate volume   Culture   Final    NO GROWTH 5 DAYS Performed at [facility], [address] [place], [place] [address]    Report Status 02/09/2020 FINAL  Final  Blood Culture (routine x 2)     Status: None   Collection Time: 02/04/20  9:51 PM   Specimen: BLOOD LEFT ARM  Result Value Ref Range Status   Specimen Description BLOOD LEFT ARM  Final   Special Requests   Final    BOTTLES DRAWN AEROBIC AND ANAEROBIC Blood Culture adequate volume   Culture   Final    NO GROWTH 5 DAYS Performed at [facility], [address], [place], [place] [address]    Report Status 02/09/2020 FINAL  Final      Radiology Studies: CT ANGIO CHEST PE W OR WO CONTRAST  Result Date: 02/12/2020 CLINICAL DATA:  COVID positive, shortness of breath EXAM: CT ANGIOGRAPHY CHEST WITH CONTRAST TECHNIQUE: Multidetector CT imaging of the chest was performed using the standard protocol during bolus administration of intravenous contrast. Multiplanar CT image reconstructions and MIPs were obtained to evaluate the vascular anatomy. CONTRAST:  63mL OMNIPAQUE IOHEXOL 350 MG/ML SOLN COMPARISON:  02/07/2020 FINDINGS: Cardiovascular: Satisfactory opacification of the pulmonary arteries to the segmental level. Filling defects are present within bilateral lower lobe and right middle lobe segmental branches. Normal heart size without evidence of right heart strain. No pericardial effusion. Mediastinum/Nodes: Likely reactive mildly prominent nodes are  present. Thyroid and esophagus are unremarkable. Lungs/Pleura: Bilateral ground-glass and consolidative opacities are again identified with worsening compared to the prior chest CT. No pleural effusion or pneumothorax. Upper Abdomen: Small hiatal hernia. Stable indeterminate 1.6 cm left adrenal nodule. Musculoskeletal: No acute osseous abnormality. Review of the MIP images confirms the above findings. IMPRESSION: Acute bilateral lower lobe and right middle lobe segmental pulmonary emboli. No evidence of right heart strain. Multifocal pneumonia with worsening of aeration since 02/07/2020. Indeterminate 1.6 cm left adrenal nodule. Small hiatal hernia. These results were called by telephone at the time of interpretation on 02/12/2020 at 5:08 pm to provider [facility] PATEL , who verbally acknowledged these results. Electronically Signed   By: Guadlupe Spanish M.D.   On: 02/12/2020 17:08   VAS [place] LOWER EXTREMITY VENOUS (DVT)  Result Date: 02/12/2020  Lower  Venous DVT Study Other Indications: Covid, D-Dimer. Comparison Study: Previous 02/08/2020 Negative Performing Technologist: Vonzell Schlatter RVT  Examination Guidelines: A complete evaluation includes B-mode imaging, spectral Doppler, color Doppler, and power Doppler as needed of all accessible portions of each vessel. Bilateral testing is considered an integral part of a complete examination. Limited examinations for reoccurring indications may be performed as noted. The reflux portion of the exam is performed with the patient in reverse Trendelenburg.  +---------+---------------+---------+-----------+----------+--------------+ RIGHT    CompressibilityPhasicitySpontaneityPropertiesThrombus Aging +---------+---------------+---------+-----------+----------+--------------+ CFV      Full           Yes      Yes                                 +---------+---------------+---------+-----------+----------+--------------+ SFJ      Full                                                         +---------+---------------+---------+-----------+----------+--------------+ FV Prox  Full                                                        +---------+---------------+---------+-----------+----------+--------------+ FV Mid   Full                                                        +---------+---------------+---------+-----------+----------+--------------+ FV DistalFull                                                        +---------+---------------+---------+-----------+----------+--------------+ PFV      Full                                                        +---------+---------------+---------+-----------+----------+--------------+ POP      Full           Yes      Yes                                 +---------+---------------+---------+-----------+----------+--------------+ PTV      Full                                                        +---------+---------------+---------+-----------+----------+--------------+ PERO     Full                                                        +---------+---------------+---------+-----------+----------+--------------+   +---------+---------------+---------+-----------+----------+--------------+  LEFT     CompressibilityPhasicitySpontaneityPropertiesThrombus Aging +---------+---------------+---------+-----------+----------+--------------+ CFV      Full           Yes      Yes                                 +---------+---------------+---------+-----------+----------+--------------+ SFJ      Full                                                        +---------+---------------+---------+-----------+----------+--------------+ FV Prox  Full                                                        +---------+---------------+---------+-----------+----------+--------------+ FV Mid   Full                                                         +---------+---------------+---------+-----------+----------+--------------+ FV DistalFull                                                        +---------+---------------+---------+-----------+----------+--------------+ PFV      Full                                                        +---------+---------------+---------+-----------+----------+--------------+ POP      Full           Yes      Yes                                 +---------+---------------+---------+-----------+----------+--------------+ PTV      Full                                                        +---------+---------------+---------+-----------+----------+--------------+ PERO     Full                                                        +---------+---------------+---------+-----------+----------+--------------+     Summary: RIGHT: - There is no evidence of deep vein thrombosis in the lower extremity.  - No cystic structure found in the popliteal fossa.  LEFT: - There is no evidence of deep vein thrombosis in the lower extremity.  - No  cystic structure found in the popliteal fossa.  *See table(s) above for measurements and observations. Electronically signed by Gretta Began MD on 02/12/2020 at 2:02:15 PM.    Final     Scheduled Meds: . albuterol  2 puff Inhalation Q6H  . vitamin C  500 mg Oral Daily  . baricitinib  4 mg Oral Daily  . benzonatate  200 mg Oral BID  . chlorpheniramine-HYDROcodone  5 mL Oral QHS  . enoxaparin (LOVENOX) injection  100 mg Subcutaneous Q12H  . insulin aspart  0-20 Units Subcutaneous TID WC  . [START ON 02/14/2020] insulin aspart  8 Units Subcutaneous TID WC  . insulin detemir  20 Units Subcutaneous BID  . linagliptin  5 mg Oral Daily  . methylPREDNISolone (SOLU-MEDROL) injection  60 mg Intravenous Q6H  . mirtazapine  7.5 mg Oral QHS  . zinc sulfate  220 mg Oral Daily   Continuous Infusions:   LOS: 8 days   Time spent: 35 minutes.  Tyrone Nine, MD [facility] [email or web address] 02/13/2020, 6:35 PM

## 2020-02-14 LAB — GLUCOSE, CAPILLARY
Glucose-Capillary: 175 mg/dL — ABNORMAL HIGH (ref 70–99)
Glucose-Capillary: 275 mg/dL — ABNORMAL HIGH (ref 70–99)
Glucose-Capillary: 155 mg/dL — ABNORMAL HIGH (ref 70–99)

## 2020-02-14 LAB — CBC WITH DIFFERENTIAL/PLATELET
Abs Immature Granulocytes: 0.38 10*3/uL — ABNORMAL HIGH (ref 0.00–0.07)
Basophils Absolute: 0 10*3/uL (ref 0.0–0.1)
Basophils Relative: 0 %
Eosinophils Absolute: 0 10*3/uL (ref 0.0–0.5)
Eosinophils Relative: 0 %
HCT: 36.8 % (ref 36.0–46.0)
Hemoglobin: 11.9 g/dL — ABNORMAL LOW (ref 12.0–15.0)
Immature Granulocytes: 2 %
Lymphocytes Relative: 2 %
Lymphs Abs: 0.5 10*3/uL — ABNORMAL LOW (ref 0.7–4.0)
MCH: 28.3 pg (ref 26.0–34.0)
MCHC: 32.3 g/dL (ref 30.0–36.0)
MCV: 87.6 fL (ref 80.0–100.0)
Monocytes Absolute: 0.5 10*3/uL (ref 0.1–1.0)
Monocytes Relative: 2 %
Neutro Abs: 23.8 10*3/uL — ABNORMAL HIGH (ref 1.7–7.7)
Neutrophils Relative %: 94 %
Platelets: 295 10*3/uL (ref 150–400)
RBC: 4.2 MIL/uL (ref 3.87–5.11)
RDW: 13.6 % (ref 11.5–15.5)
WBC: 25.2 10*3/uL — ABNORMAL HIGH (ref 4.0–10.5)
nRBC: 0 % (ref 0.0–0.2)

## 2020-02-14 LAB — MAGNESIUM: Magnesium: 2.5 mg/dL — ABNORMAL HIGH (ref 1.7–2.4)

## 2020-02-14 LAB — COMPREHENSIVE METABOLIC PANEL
ALT: 25 U/L (ref 0–44)
AST: 20 U/L (ref 15–41)
Albumin: 2.6 g/dL — ABNORMAL LOW (ref 3.5–5.0)
Alkaline Phosphatase: 108 U/L (ref 38–126)
Anion gap: 10 (ref 5–15)
BUN: 19 mg/dL (ref 6–20)
CO2: 26 mmol/L (ref 22–32)
Calcium: 8.4 mg/dL — ABNORMAL LOW (ref 8.9–10.3)
Chloride: 100 mmol/L (ref 98–111)
Creatinine, Ser: 0.69 mg/dL (ref 0.44–1.00)
GFR, Estimated: >60 mL/min (ref >60)
Glucose, Bld: 194 mg/dL — ABNORMAL HIGH (ref 70–99)
Potassium: 4.2 mmol/L (ref 3.5–5.1)
Sodium: 136 mmol/L (ref 135–145)
Total Bilirubin: 0.9 mg/dL (ref 0.3–1.2)
Total Protein: 6.9 g/dL (ref 6.5–8.1)

## 2020-02-14 LAB — HEMOGLOBIN A1C
Hgb A1c MFr Bld: 7.7 % — ABNORMAL HIGH (ref 4.8–5.6)
Mean Plasma Glucose: 174.29 mg/dL

## 2020-02-14 LAB — C-REACTIVE PROTEIN: CRP: 0.8 mg/dL (ref <1.0)

## 2020-02-14 LAB — D-DIMER, QUANTITATIVE: D-Dimer, Quant: >20 ug/mL-FEU — ABNORMAL HIGH (ref 0.00–0.50)

## 2020-02-14 MED ORDER — METHYLPREDNISOLONE SODIUM SUCC 125 MG IJ SOLR
40.0000 mg | Freq: Two times a day (BID) | INTRAMUSCULAR | Status: DC
  Administered 2020-02-14 – 2020-02-15 (×3): 40 mg via INTRAVENOUS
  Filled 2020-02-14 (×3): qty 2

## 2020-02-14 MED ORDER — TRAZODONE HCL 50 MG PO TABS
50.0000 mg | ORAL_TABLET | Freq: Every day | ORAL | Status: DC
  Administered 2020-02-14 – 2020-02-26 (×13): 50 mg via ORAL
  Filled 2020-02-14 (×13): qty 1

## 2020-02-14 NOTE — Evaluation (Addendum)
Occupational Therapy Evaluation Patient Details Name: Christina Vincent MRN: [id number] DOB: Dec 29, 1960 Today's Date: 02/14/2020    History of Present Illness 60 y.o. female with medical history significant of depression, dyslipidemia, type 2 diabetes, class I obesity, hypertension who is coming to the emergency department due to progressively worse shortness of breath for the past 6 days associated with fever, chills, fatigue, malaise, sore throat, occasionally productive of whitish/clear dry cough and rhinorrhea.  Patient is unvaccinated and has been admitted for acute hypoxemic respiratory failure secondary to COVID-19.  Pt also found to have PE 02/12/20   Clinical Impression   Patient presents with decreased activity tolerance and impaired cardiopulmonary endurance resulting in needing to perform all ADLs at bed level. Patient on 15 L HFNC and NRB with sat 90% when therapist entered the room. With answering questions patient's o2 sat dropping to mid 80s and at times down to 76%. Transfers and mobility deferred at this time due to desaturations. Patient demonstrates ability to perform bed mobility without assistance. Patient educated on use of breathing techniques, breathing devices, prone positioning and mobility for recovery. Patient verbalized understanding. Patient will benefit from skilled OT services to improve activity tolerance and cardiopulmonary status  to reduce oxygenation needs in order for patient to return home at discharge. .      Follow Up Recommendations  Home health OT    Equipment Recommendations   (TBD)    Recommendations for Other Services       Precautions / Restrictions Precautions Precaution Comments: monitor sats Restrictions Weight Bearing Restrictions: No      Mobility Bed Mobility Overal bed mobility: Modified Independent             General bed mobility comments: Patient demonstrates the ability to sit up in bed, move legs and arms as needed. Patient  able to roll left and right. However due to lows sats unable to tolerate transfer into sitting at this time.    Transfers                      Balance                                           ADL either performed or assessed with clinical judgement   ADL Overall ADL's : Needs assistance/impaired Eating/Feeding: Independent   Grooming: Independent   Upper Body Bathing: Set up;Bed level   Lower Body Bathing: Set up;Bed level   Upper Body Dressing : Set up;Bed level   Lower Body Dressing: Set up;Bed level     Toilet Transfer Details (indicate cue type and reason): unable to tolerate Toileting- Clothing Manipulation and Hygiene: Bed level;Moderate assistance               Vision Patient Visual Report: No change from baseline       Perception     Praxis      Pertinent Vitals/Pain Pain Assessment: No/denies pain     Hand Dominance     Extremity/Trunk Assessment Upper Extremity Assessment Upper Extremity Assessment: Overall WFL for tasks assessed   Lower Extremity Assessment Lower Extremity Assessment: Defer to PT evaluation   Cervical / Trunk Assessment Cervical / Trunk Assessment: Normal   Communication Communication Communication: No difficulties   Cognition Arousal/Alertness: Awake/alert Behavior During Therapy: WFL for tasks assessed/performed Overall Cognitive Status: Within Functional Limits for tasks assessed  General Comments       Exercises     Shoulder Instructions      Home Living Family/patient expects to be discharged to:: Private residence Living Arrangements: Spouse/significant other;Children Available Help at Discharge: Family Type of Home: House       Home Layout: Two level Alternate Level Stairs-Number of Steps: flight             Home Equipment: None          Prior Functioning/Environment Level of Independence: Independent                  OT Problem List: Cardiopulmonary status limiting activity;Decreased activity tolerance      OT Treatment/Interventions: Self-care/ADL training;Therapeutic exercise;DME and/or AE instruction;Therapeutic activities;Patient/family education;Energy conservation    OT Goals(Current goals can be found in the care plan section) Acute Rehab OT Goals Patient Stated Goal: to be independent again OT Goal Formulation: With patient Time For Goal Achievement: 02/28/20 Potential to Achieve Goals: Good ADL Goals Pt Will Perform Lower Body Dressing: sit to/from stand;with modified independence Pt Will Transfer to Toilet: with modified independence;ambulating;regular height toilet;grab bars Pt Will Perform Toileting - Clothing Manipulation and hygiene: with modified independence;sit to/from stand Additional ADL Goal #1: Patient will stand at sink to perform grooming task as evidence of improving activity tolerance Additional ADL Goal #2: Patient will perform 10 min functional activity or exercise activity as evidence of improving activity tolerance  OT Frequency: Min 2X/week   Barriers to D/C:            Co-evaluation              AM-PAC OT "6 Clicks" Daily Activity     Outcome Measure Help from another person eating meals?: None Help from another person taking care of personal grooming?: None Help from another person toileting, which includes using toliet, bedpan, or urinal?: A Lot Help from another person bathing (including washing, rinsing, drying)?: A Little Help from another person to put on and taking off regular upper body clothing?: A Little Help from another person to put on and taking off regular lower body clothing?: A Little 6 Click Score: 19   End of Session Equipment Utilized During Treatment: Oxygen Nurse Communication: Mobility status  Activity Tolerance: Patient limited by fatigue;Other (comment) (poor cardiopulmonary endurance) Patient left: in bed;with  call bell/phone within reach;with bed alarm set  OT Visit Diagnosis: Other (comment) (acute respiratory failure due to COVID)                Time: 8588-5027 OT Time Calculation (min): 15 min Charges:  OT General Charges $OT Visit: 1 Visit OT Evaluation $OT Eval Low Complexity: 1 Low  Prescilla Monger, OTR/L Acute Care Rehab Services  Office [phone] Pager: [phone]   [name] 02/14/2020, 4:49 PM

## 2020-02-14 NOTE — Progress Notes (Signed)
PROGRESS NOTE  Christina Vincent  WUJ:[id number] DOB: 09/14/1960 DOA: 02/04/2020 PCP: Avon Gully, MD   Brief Narrative: Christina Vincent is a 60 y.o. female with a history of T2DM, HTN, HLD, obesity and depression who presented with fever and dyspnea found to have positive SARS-CoV-2 PCR and mild hypoxia despite clear CXR on 12/27. CRP 16.6, PCT negative.   Assessment & Plan: Principal Problem:   2019 novel coronavirus disease (COVID-19) Active Problems:   Hypertension   Type 2 diabetes mellitus (HCC)   Dyslipidemia   Depression   Class 1 obesity   Acute hypoxemic respiratory failure due to COVID-19 [facility])  Acute hypoxemic respiratory failure due to covid-19 pneumonia: SARS-CoV-2 positive on 02/04/2020.  - Completed remdesivir - Continue steroids, will taper with normalization of CRP, continue immunomodulator as below. - Continue baricitinib - Encourage OOB, IS, FV, and awake proning if able - Continue airborne, contact precautions for 21 days from positive testing. - Monitor CMP and inflammatory markers - Enoxaparin prophylactic dose.   Segmental PE: No right heart strain. - Continue lovenox 1mg /kg q12h. Can stop checking d-dimer.  T2DM with steroid-induced hyperglycemia: HbA1c 7.7%.  - Augmented levemir to 20u BID, increased 8u mealtime TIDWC. Will continue on this as we're tapering steroids. May decrease regimen going forward. - Continue linagliptin  HTN:  - Continue losartan  LFT elevation: Resolved. - Holding statin  Depression, insomnia:  - Continue remeron. Add trazodone as this may help with sleep as well.  Obesity: Estimated body mass index is 33.15 kg/m as calculated from the following:   Height as of this encounter: 5\' 8"  (1.727 m).   Weight as of this encounter: 98.9 kg.  DVT prophylaxis: Lovenox Code Status: Full Family Communication: Husband and daughter by phone Disposition Plan:  Status is: Inpatient  Remains inpatient appropriate because:Inpatient  level of care appropriate due to severity of illness  Dispo:  Patient From: Home  Planned Disposition: Home with Health Care Svc  Expected discharge date: 02/19/2020  Medically stable for discharge: No  Consultants:   None  Procedures:   None  Antimicrobials:  Remdesivir   Subjective: No major changes, though slightly feeling encouraged. Still dyspneic with any exertion, no chest pain. Has been doing bed level exercises and using IS. No leg swelling or bleeding.  Objective: Vitals:   02/14/20 0509 02/14/20 0829 02/14/20 1359 02/14/20 1400  BP: 125/69   125/73  Pulse: 87   (!) 101  Resp: (!) 22   20  Temp: 98.1 F (36.7 C)   98 F (36.7 C)  TempSrc: Oral   Oral  SpO2: 93% 93% 90% (!) 89%  Weight:      Height:       No intake or output data in the 24 hours ending 02/14/20 1753 Filed Weights   02/04/20 1838  Weight: 98.9 kg   Gen: 60 y.o. female in no distress Pulm: Nonlabored with good aeration, slightly improved crackles. No wheezes. CV: Regular rate and rhythm. No murmur, rub, or gallop. No JVD, no dependent edema. GI: Abdomen soft, non-tender, non-distended, with normoactive bowel sounds.  Ext: Warm, no deformities Skin: No rashes, lesions or ulcers on visualized skin. Neuro: Alert and oriented. No focal neurological deficits. Psych: Judgement and insight appear fair. Mood euthymic & affect congruent. Behavior is appropriate.    Data Reviewed: I have personally reviewed following labs and imaging studies  CBC: Recent Labs  Lab 02/09/20 0512 02/11/20 0802 02/12/20 0421 02/13/20 0651 02/14/20 0539  WBC 14.7*  17.7* 18.1* 23.0* 25.2*  NEUTROABS 13.3* 16.7* 17.0* 21.7* 23.8*  HGB 11.5* 11.4* 11.6* 12.1 11.9*  HCT 35.4* 34.3* 35.7* 37.4 36.8  MCV 87.4 86.6 86.9 87.2 87.6  PLT 239 250 213 292 295   Basic Metabolic Panel: Recent Labs  Lab 02/09/20 0512 02/11/20 0802 02/12/20 0421 02/13/20 0651 02/14/20 0539  NA 137 138 138 137 136  K 3.9 3.8 4.2  4.3 4.2  CL 99 101 99 98 100  CO2 27 24 26 26 26   GLUCOSE 200* 199* 216* 230* 194*  BUN 15 16 17 17 19   CREATININE 0.65 0.69 0.66 0.56 0.69  CALCIUM 8.8* 8.6* 8.6* 8.5* 8.4*  MG  --  2.3 2.5* 2.7* 2.5*   GFR: Estimated Creatinine Clearance: 95.4 mL/min (by C-G formula based on SCr of 0.69 mg/dL). Liver Function Tests: Recent Labs  Lab 02/09/20 0512 02/11/20 0802 02/12/20 0421 02/13/20 0651 02/14/20 0539  AST 34 21 19 22 20   ALT 26 23 22 29 25   ALKPHOS 84 93 87 102 108  BILITOT 0.6 0.8 0.6 0.9 0.9  PROT 7.8 7.4 7.1 7.6 6.9  ALBUMIN 2.9* 2.7* 2.7* 2.9* 2.6*   No results for input(s): LIPASE, AMYLASE in the last 168 hours. No results for input(s): AMMONIA in the last 168 hours. Coagulation Profile: No results for input(s): INR, PROTIME in the last 168 hours. Cardiac Enzymes: No results for input(s): CKTOTAL, CKMB, CKMBINDEX, TROPONINI in the last 168 hours. BNP (last 3 results) No results for input(s): PROBNP in the last 8760 hours. HbA1C: Recent Labs    02/14/20 0539  HGBA1C 7.7*   CBG: Recent Labs  Lab 02/13/20 1656 02/13/20 2117 02/14/20 0813 02/14/20 1203 02/14/20 1715  GLUCAP 190* 127* 175* 275* 155*   Lipid Profile: No results for input(s): CHOL, HDL, LDLCALC, TRIG, CHOLHDL, LDLDIRECT in the last 72 hours. Thyroid Function Tests: No results for input(s): TSH, T4TOTAL, FREET4, T3FREE, THYROIDAB in the last 72 hours. Anemia Panel: No results for input(s): VITAMINB12, FOLATE, FERRITIN, TIBC, IRON, RETICCTPCT in the last 72 hours. Urine analysis: No results found for: COLORURINE, APPEARANCEUR, LABSPEC, PHURINE, GLUCOSEU, HGBUR, BILIRUBINUR, KETONESUR, PROTEINUR, UROBILINOGEN, NITRITE, LEUKOCYTESUR Recent Results (from the past 240 hour(s))  Resp Panel by RT-PCR (Flu A&B, Covid) Nasopharyngeal Swab     Status: Abnormal   Collection Time: 02/04/20  6:40 PM   Specimen: Nasopharyngeal Swab; Nasopharyngeal(NP) swabs in vial transport medium  Result Value Ref  Range Status   SARS Coronavirus 2 by RT PCR POSITIVE (A) NEGATIVE Final    Comment: RESULT CALLED TO, READ BACK BY AND VERIFIED WITH: OAKLEY,B AT 1958 ON 02/04/2020 BY MOSLEY,J (NOTE) SARS-CoV-2 target nucleic acids are DETECTED.  The SARS-CoV-2 RNA is generally detectable in upper respiratory specimens during the acute phase of infection. Positive results are indicative of the presence of the identified virus, but do not rule out bacterial infection or co-infection with other pathogens not detected by the test. Clinical correlation with patient history and other diagnostic information is necessary to determine patient infection status. The expected result is Negative.  Fact Sheet for Patients: 04/13/20  Fact Sheet for Healthcare Providers: 04/13/20  This test is not yet approved or cleared by the 02/06/20 FDA and  has been authorized for detection and/or diagnosis of SARS-CoV-2 by FDA under an Emergency Use Authorization (EUA).  This EUA will remain in effect (meaning this te st can be used) for the duration of  the COVID-19 declaration under Section 564(b)(1) of the Act, 21  U.S.C. section 360bbb-3(b)(1), unless the authorization is terminated or revoked sooner.     Influenza A by PCR NEGATIVE NEGATIVE Final   Influenza B by PCR NEGATIVE NEGATIVE Final    Comment: (NOTE) The Xpert Xpress SARS-CoV-2/FLU/RSV plus assay is intended as an aid in the diagnosis of influenza from Nasopharyngeal swab specimens and should not be used as a sole basis for treatment. Nasal washings and aspirates are unacceptable for Xpert Xpress SARS-CoV-2/FLU/RSV testing.  Fact Sheet for Patients: EntrepreneurPulse.com.au  Fact Sheet for Healthcare Providers: IncredibleEmployment.be  This test is not yet approved or cleared by the [place] FDA and has been authorized for detection and/or  diagnosis of SARS-CoV-2 by FDA under an Emergency Use Authorization (EUA). This EUA will remain in effect (meaning this test can be used) for the duration of the COVID-19 declaration under Section 564(b)(1) of the Act, 21 U.S.C. section 360bbb-3(b)(1), unless the authorization is terminated or revoked.  Performed at [facility], [address], [place], [place] [address]   Blood Culture (routine x 2)     Status: None   Collection Time: 02/04/20  9:32 PM   Specimen: BLOOD RIGHT ARM  Result Value Ref Range Status   Specimen Description BLOOD RIGHT ARM  Final   Special Requests   Final    BOTTLES DRAWN AEROBIC AND ANAEROBIC Blood Culture adequate volume   Culture   Final    NO GROWTH 5 DAYS Performed at [facility], [address], [place], [place] [address]    Report Status 02/09/2020 FINAL  Final  Blood Culture (routine x 2)     Status: None   Collection Time: 02/04/20  9:51 PM   Specimen: BLOOD LEFT ARM  Result Value Ref Range Status   Specimen Description BLOOD LEFT ARM  Final   Special Requests   Final    BOTTLES DRAWN AEROBIC AND ANAEROBIC Blood Culture adequate volume   Culture   Final    NO GROWTH 5 DAYS Performed at [facility], [address], [place],  [address]    Report Status 02/09/2020 FINAL  Final      Radiology Studies: No results found.  Scheduled Meds: . albuterol  2 puff Inhalation Q6H  . vitamin C  500 mg Oral Daily  . baricitinib  4 mg Oral Daily  . benzonatate  200 mg Oral BID  . chlorpheniramine-HYDROcodone  5 mL Oral QHS  . enoxaparin (LOVENOX) injection  100 mg Subcutaneous Q12H  . insulin aspart  0-20 Units Subcutaneous TID WC  . insulin aspart  8 Units Subcutaneous TID WC  . insulin detemir  20 Units Subcutaneous BID  . linagliptin  5 mg Oral Daily  . methylPREDNISolone (SOLU-MEDROL) injection  40 mg Intravenous Q12H  . mirtazapine  7.5 mg Oral QHS  . zinc sulfate  220 mg Oral Daily   Continuous Infusions:   LOS: 9 days   Time  spent: 35 minutes.  Patrecia Pour, MD [facility] [email or web address] 02/14/2020, 5:53 PM

## 2020-02-15 LAB — GLUCOSE, CAPILLARY
Glucose-Capillary: 178 mg/dL — ABNORMAL HIGH (ref 70–99)
Glucose-Capillary: 274 mg/dL — ABNORMAL HIGH (ref 70–99)
Glucose-Capillary: 144 mg/dL — ABNORMAL HIGH (ref 70–99)
Glucose-Capillary: 103 mg/dL — ABNORMAL HIGH (ref 70–99)

## 2020-02-15 MED ORDER — DEXAMETHASONE 4 MG PO TABS
6.0000 mg | ORAL_TABLET | Freq: Every day | ORAL | Status: DC
  Administered 2020-02-16: 6 mg via ORAL
  Filled 2020-02-15: qty 1

## 2020-02-15 MED ORDER — INSULIN ASPART 100 UNIT/ML ~~LOC~~ SOLN
10.0000 [IU] | Freq: Three times a day (TID) | SUBCUTANEOUS | Status: DC
  Administered 2020-02-15 – 2020-02-17 (×7): 10 [IU] via SUBCUTANEOUS

## 2020-02-15 MED ORDER — ALPRAZOLAM 0.25 MG PO TABS
0.2500 mg | ORAL_TABLET | Freq: Every evening | ORAL | Status: DC | PRN
  Administered 2020-02-15 – 2020-02-17 (×2): 0.25 mg via ORAL
  Filled 2020-02-15 (×2): qty 1

## 2020-02-15 NOTE — Plan of Care (Signed)

## 2020-02-15 NOTE — Progress Notes (Signed)
ANTICOAGULATION CONSULT NOTE - Follow Up Consult  Pharmacy Consult for Lovenox Indication: pulmonary embolus  No Known Allergies  Patient Measurements: Height: 5\' 8"  (172.7 cm) Weight: 98.9 kg (218 lb) IBW/kg (Calculated) : 63.9  Vital Signs: Temp: 97.6 F (36.4 C) (01/07 0458) Temp Source: Oral (01/07 0458) BP: 110/73 (01/07 0458) Pulse Rate: 76 (01/07 0458)  Labs: Recent Labs    02/13/20 0651 02/14/20 0539  HGB 12.1 11.9*  HCT 37.4 36.8  PLT 292 295  CREATININE 0.56 0.69    Estimated Creatinine Clearance: 95.4 mL/min (by C-G formula based on SCr of 0.69 mg/dL).   Medical History: Past Medical History:  Diagnosis Date  . Depression   . Dyslipidemia     Assessment: 60 yo F admitted on 12/27 with COVID PNA.  She has been on Lovenox since admission for VTE px.  Dose was briefly increased to 1mg /kg q12h x2 doses on 12/30 & 31 due to spike in DDimer but reduced back to px dose when dopplers were negative for DVT.   Chest CTa 11/4  + bilateral PE without evidence of right heart strain.  Repeat dopplers remain negative for DVT.  Pharmacy consulted to change to full-dose Lovenox.   CBC: Hgb 11.9, Plts WNL  SCr stable, CrCl ~95 ml/min  No bleeding documented  Goal of Therapy:  Anti-Xa level 0.6-1 units/ml 4hrs after LMWH dose given Monitor platelets by anticoagulation protocol: Yes   Plan:  Continue Lovenox 100mg  SQ q12h Monitor CBC at least q3days Follow up plans for transition to oral [facility]  13/4, PharmD, BCPS Pharmacy: [phone] 02/15/2020,7:49 AM

## 2020-02-15 NOTE — Progress Notes (Signed)
PROGRESS NOTE  Christina Vincent  DGL:[id number] DOB: 1960/05/16 DOA: 02/04/2020 PCP: Avon Gully, MD   Brief Narrative: Christina Vincent is a 60 y.o. female with a history of T2DM, HTN, HLD, obesity and depression who presented with fever and dyspnea found to have positive SARS-CoV-2 PCR and mild hypoxia despite clear CXR on 12/27. CRP 16.6, PCT negative. Full scope treatment commenced with subsequent worsening of hypoxia. CTA chest 1/4 revealed worsening airspace disease compatible with progressive covid pneumonia as well as acute bilateral pulmonary emboli without right heart strain.  Assessment & Plan: Principal Problem:   2019 novel coronavirus disease (COVID-19) Active Problems:   Hypertension   Type 2 diabetes mellitus (HCC)   Dyslipidemia   Depression   Class 1 obesity   Acute hypoxemic respiratory failure due to COVID-19 [facility])  Acute hypoxemic respiratory failure due to covid-19 pneumonia: SARS-CoV-2 positive on 02/04/2020.  - Completed remdesivir - Continue steroids, will  further taper to po decadron with normal CRP, now 12 days steroid Tx, concomitant use of baricitinib, and hyperglycemia.   - Continue baricitinib - Encourage OOB, IS, FV, and awake proning if able - Continue airborne, contact precautions for 21 days from positive testing. - Monitor CMP and inflammatory markers - Enoxaparin prophylactic dose.   Segmental PE: No right heart strain. - Continue lovenox 1mg /kg q12h.   T2DM with steroid-induced hyperglycemia: HbA1c 7.7%.  - Augmented levemir to 20u BID, will now increase mealtime to 10u TIDWC and continue resistant SSI.   - Continue linagliptin  HTN:  - Continue losartan  LFT elevation: Resolved. - Holding statin  Depression, insomnia:  - Continue remeron. Added trazodone  Anxiety: Exacerbated by hospitalization, particularly at night.  - Add xanax dose for qHS. Continue TID prn otherwise. This is a 0.25mg  dose.  Obesity: Estimated body mass index is  33.15 kg/m as calculated from the following:   Height as of this encounter: 5\' 8"  (1.727 m).   Weight as of this encounter: 98.9 kg.  DVT prophylaxis: Lovenox Code Status: Full Family Communication: Husband by phone Disposition Plan:  Status is: Inpatient  Remains inpatient appropriate because:Inpatient level of care appropriate due to severity of illness  Dispo:  Patient From: Home  Planned Disposition: Home with Health Care Svc  Expected discharge date: 02/19/2020  Medically stable for discharge: No  Consultants:   None  Procedures:   None  Antimicrobials:  Remdesivir   Subjective: Gets extremely anxious every night, last night as well causing loss of sleep. Dyspnea at rest is improving, still not moving around out of bed too much as limited by pulmonary status. No chest pain, no leg swelling, reports no bleeding. Using IS consistently.   Objective: Vitals:   02/14/20 2145 02/15/20 0206 02/15/20 0458 02/15/20 0958  BP: (!) 118/98  110/73   Pulse: 100 86 76   Resp: (!) 22 (!) 25 (!) 22   Temp: 99 F (37.2 C)  97.6 F (36.4 C)   TempSrc: Oral  Oral   SpO2: (!) 89% (!) 88% 97% (!) 88%  Weight:      Height:        Intake/Output Summary (Last 24 hours) at 02/15/2020 1151 Last data filed at 02/15/2020 0458 Gross per 24 hour  Intake 60 ml  Output 875 ml  Net -815 ml   Filed Weights   02/04/20 1838  Weight: 98.9 kg   Gen: 60 y.o. female in no distress Pulm: Nonlabored tachypnea breathing HFNC + mask, crackles improved, still there,  no wheezes. SpO2 88-94% on this at rest. CV: Regular rate and rhythm. No murmur, rub, or gallop. No JVD, no dependent edema. GI: Abdomen soft, non-tender, non-distended, with normoactive bowel sounds.  Ext: Warm, no deformities Skin: No rashes, lesions or ulcers on visualized skin. Neuro: Alert and oriented. No focal neurological deficits. Psych: Judgement and insight appear fair. Mood euthymic & affect congruent. Behavior is  appropriate.    Data Reviewed: I have personally reviewed following labs and imaging studies  CBC: Recent Labs  Lab 02/09/20 0512 02/11/20 0802 02/12/20 0421 02/13/20 0651 02/14/20 0539  WBC 14.7* 17.7* 18.1* 23.0* 25.2*  NEUTROABS 13.3* 16.7* 17.0* 21.7* 23.8*  HGB 11.5* 11.4* 11.6* 12.1 11.9*  HCT 35.4* 34.3* 35.7* 37.4 36.8  MCV 87.4 86.6 86.9 87.2 87.6  PLT 239 250 213 292 518   Basic Metabolic Panel: Recent Labs  Lab 02/09/20 0512 02/11/20 0802 02/12/20 0421 02/13/20 0651 02/14/20 0539  NA 137 138 138 137 136  K 3.9 3.8 4.2 4.3 4.2  CL 99 101 99 98 100  CO2 27 24 26 26 26   GLUCOSE 200* 199* 216* 230* 194*  BUN 15 16 17 17 19   CREATININE 0.65 0.69 0.66 0.56 0.69  CALCIUM 8.8* 8.6* 8.6* 8.5* 8.4*  MG  --  2.3 2.5* 2.7* 2.5*   GFR: Estimated Creatinine Clearance: 95.4 mL/min (by C-G formula based on SCr of 0.69 mg/dL). Liver Function Tests: Recent Labs  Lab 02/09/20 0512 02/11/20 0802 02/12/20 0421 02/13/20 0651 02/14/20 0539  AST 34 21 19 22 20   ALT 26 23 22 29 25   ALKPHOS 84 93 87 102 108  BILITOT 0.6 0.8 0.6 0.9 0.9  PROT 7.8 7.4 7.1 7.6 6.9  ALBUMIN 2.9* 2.7* 2.7* 2.9* 2.6*   No results for input(s): LIPASE, AMYLASE in the last 168 hours. No results for input(s): AMMONIA in the last 168 hours. Coagulation Profile: No results for input(s): INR, PROTIME in the last 168 hours. Cardiac Enzymes: No results for input(s): CKTOTAL, CKMB, CKMBINDEX, TROPONINI in the last 168 hours. BNP (last 3 results) No results for input(s): PROBNP in the last 8760 hours. HbA1C: Recent Labs    02/14/20 0539  HGBA1C 7.7*   CBG: Recent Labs  Lab 02/14/20 0813 02/14/20 1203 02/14/20 1715 02/15/20 0800 02/15/20 1143  GLUCAP 175* 275* 155* 178* 274*   Lipid Profile: No results for input(s): CHOL, HDL, LDLCALC, TRIG, CHOLHDL, LDLDIRECT in the last 72 hours. Thyroid Function Tests: No results for input(s): TSH, T4TOTAL, FREET4, T3FREE, THYROIDAB in the last 72  hours. Anemia Panel: No results for input(s): VITAMINB12, FOLATE, FERRITIN, TIBC, IRON, RETICCTPCT in the last 72 hours. Urine analysis: No results found for: COLORURINE, APPEARANCEUR, LABSPEC, PHURINE, GLUCOSEU, HGBUR, BILIRUBINUR, KETONESUR, PROTEINUR, UROBILINOGEN, NITRITE, LEUKOCYTESUR No results found for this or any previous visit (from the past 240 hour(s)).    Radiology Studies: No results found.  Scheduled Meds: . albuterol  2 puff Inhalation Q6H  . vitamin C  500 mg Oral Daily  . baricitinib  4 mg Oral Daily  . benzonatate  200 mg Oral BID  . chlorpheniramine-HYDROcodone  5 mL Oral QHS  . enoxaparin (LOVENOX) injection  100 mg Subcutaneous Q12H  . insulin aspart  0-20 Units Subcutaneous TID WC  . insulin aspart  8 Units Subcutaneous TID WC  . insulin detemir  20 Units Subcutaneous BID  . linagliptin  5 mg Oral Daily  . methylPREDNISolone (SOLU-MEDROL) injection  40 mg Intravenous Q12H  . mirtazapine  7.5  mg Oral QHS  . traZODone  50 mg Oral QHS  . zinc sulfate  220 mg Oral Daily   Continuous Infusions:   LOS: 10 days   Time spent: 35 minutes.  Tyrone Nine, MD [facility] [email or web address] 02/15/2020, 11:51 AM

## 2020-02-15 NOTE — Progress Notes (Signed)
Physical Therapy Treatment Patient Details Name: Christina Vincent MRN: [id number] DOB: 1960-07-04 Today's Date: 02/15/2020    History of Present Illness 60 y.o. female with medical history significant of depression, dyslipidemia, type 2 diabetes, class I obesity, hypertension who is coming to the emergency department due to progressively worse shortness of breath for the past 6 days associated with fever, chills, fatigue, malaise, sore throat, occasionally productive of whitish/clear dry cough and rhinorrhea.  Patient is unvaccinated and has been admitted for acute hypoxemic respiratory failure secondary to COVID-19.  Pt also found to have PE 02/12/20    PT Comments    Pt pleasant and cooperative, reports she is sleeping in prone position at night. Pt continues to desat with minimal activity. Pt on 15L salter/HFNC, 15L partial rebreather SpO2= 96% at rest, HR 112          71% with standing/xfers, HR 120           recovery to 90% within 3-4 minutes rest, HR 106 Oxygen as above throughout Reviewed importance of proper posture, breathing with pt as well as self monitoring of dyspnea level, importance of activity progression for recovery as tolerated.  Continue PT in acute setting  Follow Up Recommendations  Home health PT     Equipment Recommendations  Rolling walker with 5" wheels;Other (comment) (vs rollator?)    Recommendations for Other Services       Precautions / Restrictions Precautions Precautions: Fall Precaution Comments: monitor sats Restrictions Weight Bearing Restrictions: No    Mobility  Bed Mobility Overal bed mobility: Modified Independent                Transfers Overall transfer level: Needs assistance Equipment used: None Transfers: Sit to/from Stand Sit to Stand: Supervision         General transfer comment: supervision for safety, cues for trunk exension. repeated sit to stand x3, cues for proper breathing, slowing speed of  ascent/descent  Ambulation/Gait                 Stairs             Wheelchair Mobility    Modified Rankin (Stroke Patients Only)       Balance                                            Cognition Arousal/Alertness: Awake/alert Behavior During Therapy: WFL for tasks assessed/performed Overall Cognitive Status: Within Functional Limits for tasks assessed                                        Exercises      General Comments        Pertinent Vitals/Pain Pain Assessment: No/denies pain    Home Living                      Prior Function            PT Goals (current goals can now be found in the care plan section) Acute Rehab PT Goals Patient Stated Goal: to be independent again PT Goal Formulation: With patient Time For Goal Achievement: 02/27/20 Potential to Achieve Goals: Fair Progress towards PT goals: Progressing toward goals    Frequency    Min 3X/week  PT Plan Current plan remains appropriate    Co-evaluation              AM-PAC PT "6 Clicks" Mobility   Outcome Measure  Help needed turning from your back to your side while in a flat bed without using bedrails?: A Little Help needed moving from lying on your back to sitting on the side of a flat bed without using bedrails?: A Little Help needed moving to and from a bed to a chair (including a wheelchair)?: A Little Help needed standing up from a chair using your arms (e.g., wheelchair or bedside chair)?: A Little Help needed to walk in hospital room?: A Little Help needed climbing 3-5 steps with a railing? : A Lot 6 Click Score: 17    End of Session Equipment Utilized During Treatment: Oxygen Activity Tolerance: Patient tolerated treatment well;Patient limited by fatigue Patient left: in bed;with call bell/phone within reach (pt transferring to [facility], no alarm on arrival)   PT Visit Diagnosis: Difficulty in walking, not  elsewhere classified (R26.2)     Time: 5329-9242 PT Time Calculation (min) (ACUTE ONLY): 18 min  Charges:  $Therapeutic Activity: 8-22 mins                     Delice Bison, PT  Acute Rehab Dept ([facility]) [phone] Pager [phone]  02/15/2020    [facility] 02/15/2020, 4:18 PM

## 2020-02-16 LAB — GLUCOSE, CAPILLARY
Glucose-Capillary: 121 mg/dL — ABNORMAL HIGH (ref 70–99)
Glucose-Capillary: 300 mg/dL — ABNORMAL HIGH (ref 70–99)
Glucose-Capillary: 189 mg/dL — ABNORMAL HIGH (ref 70–99)
Glucose-Capillary: 71 mg/dL (ref 70–99)
Glucose-Capillary: 233 mg/dL — ABNORMAL HIGH (ref 70–99)

## 2020-02-16 MED ORDER — MAGIC MOUTHWASH
15.0000 mL | Freq: Three times a day (TID) | ORAL | Status: DC | PRN
  Administered 2020-02-16 – 2020-02-18 (×4): 15 mL via ORAL
  Filled 2020-02-16 (×5): qty 15

## 2020-02-16 MED ORDER — DEXAMETHASONE 4 MG PO TABS
4.0000 mg | ORAL_TABLET | Freq: Every day | ORAL | Status: DC
  Administered 2020-02-17: 4 mg via ORAL
  Filled 2020-02-16: qty 1

## 2020-02-16 NOTE — Plan of Care (Signed)
  Problem: Clinical Measurements: Goal: Respiratory complications will improve Outcome: Progressing Goal: Cardiovascular complication will be avoided Outcome: Progressing   Problem: Activity: Goal: Risk for activity intolerance will decrease Outcome: Progressing   Problem: Nutrition: Goal: Adequate nutrition will be maintained Outcome: Progressing   Problem: Coping: Goal: Level of anxiety will decrease Outcome: Progressing   Problem: Pain Managment: Goal: General experience of comfort will improve Outcome: Progressing   Problem: Safety: Goal: Ability to remain free from injury will improve Outcome: Progressing   Problem: Skin Integrity: Goal: Risk for impaired skin integrity will decrease Outcome: Progressing   

## 2020-02-16 NOTE — Progress Notes (Signed)
PROGRESS NOTE  Christina Vincent  GUY:[id number] DOB: Jul 06, 1960 DOA: 02/04/2020 PCP: Avon Gully, MD   Brief Narrative: Christina Vincent is a 60 y.o. female with a history of T2DM, HTN, HLD, obesity and depression who presented with fever and dyspnea found to have positive SARS-CoV-2 PCR and mild hypoxia despite clear CXR on 12/27. CRP 16.6, PCT negative. Full scope treatment commenced with subsequent worsening of hypoxia. CTA chest 1/4 revealed worsening airspace disease compatible with progressive covid pneumonia as well as acute bilateral pulmonary emboli without right heart strain.  Assessment & Plan: Principal Problem:   2019 novel coronavirus disease (COVID-19) Active Problems:   Hypertension   Type 2 diabetes mellitus (HCC)   Dyslipidemia   Depression   Class 1 obesity   Acute hypoxemic respiratory failure due to COVID-19 [facility])  Acute hypoxemic respiratory failure due to covid-19 pneumonia: SARS-CoV-2 positive on 02/04/2020.  - Completed remdesivir - Continue steroids, will taper decadron to 4mg  starting tomorrow with normal CRP, concomitant use of baricitinib, and hyperglycemia.   - Continue baricitinib - Encourage OOB, IS, FV, and awake proning if able - Continue airborne, contact precautions for 21 days from positive testing.  Segmental PE: No right heart strain. - Continue lovenox 1mg /kg q12h.   T2DM with steroid-induced hyperglycemia: HbA1c 7.7%.  - Continue levemir 20u BID, fasting CBG at goal - Continue mealtime novolog 10u TIDWC and resistant SSI. Having consistent elevation at lunch that returns to goal with this regimen. - Continue linagliptin  HTN:  - Continue losartan  LFT elevation: Resolved. - Holding statin  Depression, insomnia:  - Continue remeron. Added trazodone  Anxiety: Exacerbated by hospitalization, particularly at night.  - May be improved by tapering steroids as above.  - Continue xanax 0.25mg  po TID prn AND an additional dose qHS prn as  anxiety is worsened nightly.   Mouth pain: No definitive evidence of thrush, more consistent with irritation from high flow oxygen by mask for extended period of time.  - prn magic mouthwash. Can add lidocaine if needed.   Obesity: Estimated body mass index is 33.15 kg/m as calculated from the following:   Height as of this encounter: 5\' 8"  (1.727 m).   Weight as of this encounter: 98.9 kg.  DVT prophylaxis: Lovenox Code Status: Full Family Communication: Husband by phone Disposition Plan:  Status is: Inpatient  Remains inpatient appropriate because:Inpatient level of care appropriate due to severity of illness  Dispo:  Patient From: Home  Planned Disposition: Home with Health Care Svc  Expected discharge date: 02/19/2020  Medically stable for discharge: No  Consultants:   None  Procedures:   None  Antimicrobials:  Remdesivir   Subjective: Last night's anxiety and shortness of breath was less severe with additional xanax dose. Still able to get transferred to [facility] without assistance and doesn't want to change dose of xanax. No chest pain or bleeding or other new comaplaints.   Objective: Vitals:   02/16/20 0801 02/16/20 0920 02/16/20 1235 02/16/20 1236  BP:   106/70   Pulse:   (!) 117 (!) 117  Resp:   (!) 24   Temp:   98.3 F (36.8 C)   TempSrc:   Oral   SpO2: 95% (!) 87% 95% 93%  Weight:      Height:        Intake/Output Summary (Last 24 hours) at 02/16/2020 1329 Last data filed at 02/16/2020 0816 Gross per 24 hour  Intake --  Output 1650 ml  Net -1650 ml  Filed Weights   02/04/20 1838  Weight: 98.9 kg   Gen: 60 y.o. female in no distress HEENT: Scattered fillings without gingival irritation or bleeding, some irritation to palate but overall minimal Pulm: Nonlabored breathing HFNC + NRB, SpO2 hovering in high 80%'s. Crackles stable, better aeration today. CV: Regular rate and rhythm. No murmur, rub, or gallop. No JVD, no dependent edema. GI: Abdomen  soft, non-tender, non-distended, with normoactive bowel sounds.  Ext: Warm, no deformities Skin: No rashes, lesions or ulcers on visualized skin. Neuro: Alert and oriented. No focal neurological deficits. Psych: Judgement and insight appear fair. Mood euthymic & affect congruent. Behavior is appropriate.    Data Reviewed: I have personally reviewed following labs and imaging studies  CBC: Recent Labs  Lab 02/11/20 0802 02/12/20 0421 02/13/20 0651 02/14/20 0539  WBC 17.7* 18.1* 23.0* 25.2*  NEUTROABS 16.7* 17.0* 21.7* 23.8*  HGB 11.4* 11.6* 12.1 11.9*  HCT 34.3* 35.7* 37.4 36.8  MCV 86.6 86.9 87.2 87.6  PLT 250 213 292 295   Basic Metabolic Panel: Recent Labs  Lab 02/11/20 0802 02/12/20 0421 02/13/20 0651 02/14/20 0539  NA 138 138 137 136  K 3.8 4.2 4.3 4.2  CL 101 99 98 100  CO2 24 26 26 26   GLUCOSE 199* 216* 230* 194*  BUN 16 17 17 19   CREATININE 0.69 0.66 0.56 0.69  CALCIUM 8.6* 8.6* 8.5* 8.4*  MG 2.3 2.5* 2.7* 2.5*   GFR: Estimated Creatinine Clearance: 95.4 mL/min (by C-G formula based on SCr of 0.69 mg/dL). Liver Function Tests: Recent Labs  Lab 02/11/20 0802 02/12/20 0421 02/13/20 0651 02/14/20 0539  AST 21 19 22 20   ALT 23 22 29 25   ALKPHOS 93 87 102 108  BILITOT 0.8 0.6 0.9 0.9  PROT 7.4 7.1 7.6 6.9  ALBUMIN 2.7* 2.7* 2.9* 2.6*   No results for input(s): LIPASE, AMYLASE in the last 168 hours. No results for input(s): AMMONIA in the last 168 hours. Coagulation Profile: No results for input(s): INR, PROTIME in the last 168 hours. Cardiac Enzymes: No results for input(s): CKTOTAL, CKMB, CKMBINDEX, TROPONINI in the last 168 hours. BNP (last 3 results) No results for input(s): PROBNP in the last 8760 hours. HbA1C: Recent Labs    02/14/20 0539  HGBA1C 7.7*   CBG: Recent Labs  Lab 02/15/20 1143 02/15/20 1647 02/15/20 2038 02/16/20 0751 02/16/20 1226  GLUCAP 274* 144* 103* 121* 300*   Lipid Profile: No results for input(s): CHOL, HDL,  LDLCALC, TRIG, CHOLHDL, LDLDIRECT in the last 72 hours. Thyroid Function Tests: No results for input(s): TSH, T4TOTAL, FREET4, T3FREE, THYROIDAB in the last 72 hours. Anemia Panel: No results for input(s): VITAMINB12, FOLATE, FERRITIN, TIBC, IRON, RETICCTPCT in the last 72 hours. Urine analysis: No results found for: COLORURINE, APPEARANCEUR, LABSPEC, PHURINE, GLUCOSEU, HGBUR, BILIRUBINUR, KETONESUR, PROTEINUR, UROBILINOGEN, NITRITE, LEUKOCYTESUR No results found for this or any previous visit (from the past 240 hour(s)).    Radiology Studies: No results found.  Scheduled Meds: . albuterol  2 puff Inhalation Q6H  . vitamin C  500 mg Oral Daily  . baricitinib  4 mg Oral Daily  . benzonatate  200 mg Oral BID  . chlorpheniramine-HYDROcodone  5 mL Oral QHS  . dexamethasone  6 mg Oral Daily  . enoxaparin (LOVENOX) injection  100 mg Subcutaneous Q12H  . insulin aspart  0-20 Units Subcutaneous TID WC  . insulin aspart  10 Units Subcutaneous TID WC  . insulin detemir  20 Units Subcutaneous BID  .  linagliptin  5 mg Oral Daily  . mirtazapine  7.5 mg Oral QHS  . traZODone  50 mg Oral QHS  . zinc sulfate  220 mg Oral Daily   Continuous Infusions:   LOS: 11 days   Time spent: 35 minutes.  Tyrone Nine, MD [facility] [email or web address] 02/16/2020, 1:29 PM

## 2020-02-17 LAB — GLUCOSE, CAPILLARY
Glucose-Capillary: 115 mg/dL — ABNORMAL HIGH (ref 70–99)
Glucose-Capillary: 271 mg/dL — ABNORMAL HIGH (ref 70–99)
Glucose-Capillary: 69 mg/dL — ABNORMAL LOW (ref 70–99)
Glucose-Capillary: 99 mg/dL (ref 70–99)
Glucose-Capillary: 192 mg/dL — ABNORMAL HIGH (ref 70–99)

## 2020-02-17 MED ORDER — DEXAMETHASONE 2 MG PO TABS
2.0000 mg | ORAL_TABLET | Freq: Every day | ORAL | Status: AC
  Administered 2020-02-18: 2 mg via ORAL
  Filled 2020-02-17: qty 1

## 2020-02-17 NOTE — Progress Notes (Signed)
PROGRESS NOTE  Christina Vincent  PTW:[id number] DOB: 03-04-1960 DOA: 02/04/2020 PCP: Avon Gully, MD   Brief Narrative: Christina Vincent is a 60 y.o. female with a history of T2DM, HTN, HLD, obesity and depression who presented with fever and dyspnea found to have positive SARS-CoV-2 PCR and mild hypoxia despite clear CXR on 12/27. CRP 16.6, PCT negative. Full scope treatment commenced with subsequent worsening of hypoxia. CTA chest 1/4 revealed worsening airspace disease compatible with progressive covid pneumonia as well as acute bilateral pulmonary emboli without right heart strain.  Assessment & Plan: Principal Problem:   2019 novel coronavirus disease (COVID-19) Active Problems:   Hypertension   Type 2 diabetes mellitus (HCC)   Dyslipidemia   Depression   Class 1 obesity   Acute hypoxemic respiratory failure due to COVID-19 [facility])  Acute hypoxemic respiratory failure due to covid-19 pneumonia: SARS-CoV-2 positive on 02/04/2020.  - Completed remdesivir - Continue steroids, will taper decadron to 2mg  tomorrow then off with normal CRP, concomitant use of baricitinib, and hyperglycemia.   - Continue baricitinib x14 days (last planned dose is 02/19/2020) - Encourage OOB, IS, FV, and awake proning if able - Continue airborne, contact precautions for 21 days from positive testing. - Recheck labs in AM  Segmental PE: No right heart strain. - Continue lovenox 1mg /kg q12h.   T2DM with steroid-induced hyperglycemia: HbA1c 7.7%.  - Continue levemir 20u BID, glycemic control overall improving. - Continue mealtime novolog 10u TIDWC and resistant SSI.   - Continue linagliptin  HTN:  - Continue losartan  LFT elevation: Resolved. - Holding statin  Depression, insomnia:  - Continue remeron. Added trazodone  Anxiety: Exacerbated by hospitalization, particularly at night.  - May be improved by tapering steroids as above.  - Continue xanax 0.25mg  po TID prn AND an additional dose qHS prn as  anxiety is worsened nightly.   Mouth pain: No definitive evidence of thrush, more consistent with irritation from high flow oxygen by mask for extended period of time.  - prn magic mouthwash. Can add lidocaine if needed.   Stress > urge urinary incontinence:  - Pt declines initiation of medication, will continue lifestyle modifications.  Obesity: Estimated body mass index is 33.15 kg/m as calculated from the following:   Height as of this encounter: 5\' 8"  (1.727 m).   Weight as of this encounter: 98.9 kg.  DVT prophylaxis: Lovenox Code Status: Full Family Communication: Husband by phone daily Disposition Plan:  Status is: Inpatient  Remains inpatient appropriate because:Inpatient level of care appropriate due to severity of illness  Dispo:  Patient From: Home  Planned Disposition: Home with Health Care Svc  Expected discharge date: 02/19/2020  Medically stable for discharge: No  Consultants:   None  Procedures:   None  Antimicrobials:  Remdesivir   Subjective: Still with severe dyspnea on exertion, moderate at rest. Just sitting up in bed took about 5 minutes to recover from. No chest pain or other new issues. Slept better last night. Mouth pain resolved.  Objective: Vitals:   02/17/20 0852 02/17/20 0854 02/17/20 0856 02/17/20 0900  BP:      Pulse: (!) 101 97    Resp: (!) 22     Temp:      TempSrc:      SpO2: (!) 82%  95% 96%  Weight:      Height:        Intake/Output Summary (Last 24 hours) at 02/17/2020 1312 Last data filed at 02/17/2020 1244 Gross per 24 hour  Intake 960 ml  Output 2400 ml  Net -1440 ml   Filed Weights   02/04/20 1838  Weight: 98.9 kg   Gen: 60 y.o. female in no distress Pulm: Labored tachypnea when rising to seated position, crackles stable bilaterally. CV: Regular rate and rhythm. No murmur, rub, or gallop. No JVD, no dependent edema. GI: Abdomen soft, non-tender, non-distended, with normoactive bowel sounds.  Ext: Warm, no  deformities Skin: No rashes, lesions or ulcers on visualized skin. Neuro: Alert and oriented. No focal neurological deficits. Psych: Judgement and insight appear fair. Mood euthymic & affect congruent. Behavior is appropriate.    Data Reviewed: I have personally reviewed following labs and imaging studies  CBC: Recent Labs  Lab 02/11/20 0802 02/12/20 0421 02/13/20 0651 02/14/20 0539  WBC 17.7* 18.1* 23.0* 25.2*  NEUTROABS 16.7* 17.0* 21.7* 23.8*  HGB 11.4* 11.6* 12.1 11.9*  HCT 34.3* 35.7* 37.4 36.8  MCV 86.6 86.9 87.2 87.6  PLT 250 213 292 295   Basic Metabolic Panel: Recent Labs  Lab 02/11/20 0802 02/12/20 0421 02/13/20 0651 02/14/20 0539  NA 138 138 137 136  K 3.8 4.2 4.3 4.2  CL 101 99 98 100  CO2 24 26 26 26   GLUCOSE 199* 216* 230* 194*  BUN 16 17 17 19   CREATININE 0.69 0.66 0.56 0.69  CALCIUM 8.6* 8.6* 8.5* 8.4*  MG 2.3 2.5* 2.7* 2.5*   GFR: Estimated Creatinine Clearance: 95.4 mL/min (by C-G formula based on SCr of 0.69 mg/dL). Liver Function Tests: Recent Labs  Lab 02/11/20 0802 02/12/20 0421 02/13/20 0651 02/14/20 0539  AST 21 19 22 20   ALT 23 22 29 25   ALKPHOS 93 87 102 108  BILITOT 0.8 0.6 0.9 0.9  PROT 7.4 7.1 7.6 6.9  ALBUMIN 2.7* 2.7* 2.9* 2.6*   No results for input(s): LIPASE, AMYLASE in the last 168 hours. No results for input(s): AMMONIA in the last 168 hours. Coagulation Profile: No results for input(s): INR, PROTIME in the last 168 hours. Cardiac Enzymes: No results for input(s): CKTOTAL, CKMB, CKMBINDEX, TROPONINI in the last 168 hours. BNP (last 3 results) No results for input(s): PROBNP in the last 8760 hours. HbA1C: No results for input(s): HGBA1C in the last 72 hours. CBG: Recent Labs  Lab 02/16/20 1648 02/16/20 2129 02/16/20 2307 02/17/20 0755 02/17/20 1159  GLUCAP 189* 71 233* 115* 271*   Lipid Profile: No results for input(s): CHOL, HDL, LDLCALC, TRIG, CHOLHDL, LDLDIRECT in the last 72 hours. Thyroid Function  Tests: No results for input(s): TSH, T4TOTAL, FREET4, T3FREE, THYROIDAB in the last 72 hours. Anemia Panel: No results for input(s): VITAMINB12, FOLATE, FERRITIN, TIBC, IRON, RETICCTPCT in the last 72 hours. Urine analysis: No results found for: COLORURINE, APPEARANCEUR, LABSPEC, PHURINE, GLUCOSEU, HGBUR, BILIRUBINUR, KETONESUR, PROTEINUR, UROBILINOGEN, NITRITE, LEUKOCYTESUR No results found for this or any previous visit (from the past 240 hour(s)).    Radiology Studies: No results found.  Scheduled Meds: . albuterol  2 puff Inhalation Q6H  . vitamin C  500 mg Oral Daily  . baricitinib  4 mg Oral Daily  . benzonatate  200 mg Oral BID  . chlorpheniramine-HYDROcodone  5 mL Oral QHS  . dexamethasone  4 mg Oral Daily  . enoxaparin (LOVENOX) injection  100 mg Subcutaneous Q12H  . insulin aspart  0-20 Units Subcutaneous TID WC  . insulin aspart  10 Units Subcutaneous TID WC  . insulin detemir  20 Units Subcutaneous BID  . linagliptin  5 mg Oral Daily  . mirtazapine  7.5 mg Oral QHS  . traZODone  50 mg Oral QHS  . zinc sulfate  220 mg Oral Daily   Continuous Infusions:   LOS: 12 days   Time spent: 35 minutes.  Tyrone Nine, MD [facility] [email or web address] 02/17/2020, 1:12 PM

## 2020-02-17 NOTE — Plan of Care (Signed)
  Problem: Clinical Measurements: Goal: Will remain free from infection Outcome: Progressing   Problem: Elimination: Goal: Will not experience complications related to bowel motility Outcome: Progressing   Problem: Education: Goal: Knowledge of risk factors and measures for prevention of condition will improve Outcome: Progressing   Problem: Respiratory: Goal: Will maintain a patent airway Outcome: Progressing   Problem: Coping: Goal: Level of anxiety will decrease Outcome: Not Progressing   Problem: Coping: Goal: Psychosocial and spiritual needs will be supported Outcome: Not Progressing

## 2020-02-17 NOTE — Plan of Care (Signed)
  Problem: Clinical Measurements: Goal: Will remain free from infection Outcome: Progressing Goal: Respiratory complications will improve Outcome: Progressing Goal: Cardiovascular complication will be avoided Outcome: Progressing   Problem: Activity: Goal: Risk for activity intolerance will decrease Outcome: Progressing   Problem: Pain Managment: Goal: General experience of comfort will improve Outcome: Progressing   Problem: Safety: Goal: Ability to remain free from injury will improve Outcome: Progressing   Problem: Skin Integrity: Goal: Risk for impaired skin integrity will decrease Outcome: Progressing   

## 2020-02-18 LAB — COMPREHENSIVE METABOLIC PANEL
ALT: 51 U/L — ABNORMAL HIGH (ref 0–44)
AST: 33 U/L (ref 15–41)
Albumin: 2.7 g/dL — ABNORMAL LOW (ref 3.5–5.0)
Alkaline Phosphatase: 91 U/L (ref 38–126)
Anion gap: 11 (ref 5–15)
BUN: 16 mg/dL (ref 6–20)
CO2: 29 mmol/L (ref 22–32)
Calcium: 8.7 mg/dL — ABNORMAL LOW (ref 8.9–10.3)
Chloride: 97 mmol/L — ABNORMAL LOW (ref 98–111)
Creatinine, Ser: 0.59 mg/dL (ref 0.44–1.00)
GFR, Estimated: >60 mL/min (ref >60)
Glucose, Bld: 126 mg/dL — ABNORMAL HIGH (ref 70–99)
Potassium: 4.7 mmol/L (ref 3.5–5.1)
Sodium: 137 mmol/L (ref 135–145)
Total Bilirubin: 1.1 mg/dL (ref 0.3–1.2)
Total Protein: 7.3 g/dL (ref 6.5–8.1)

## 2020-02-18 LAB — GLUCOSE, CAPILLARY
Glucose-Capillary: 154 mg/dL — ABNORMAL HIGH (ref 70–99)
Glucose-Capillary: 288 mg/dL — ABNORMAL HIGH (ref 70–99)
Glucose-Capillary: 136 mg/dL — ABNORMAL HIGH (ref 70–99)
Glucose-Capillary: 181 mg/dL — ABNORMAL HIGH (ref 70–99)

## 2020-02-18 MED ORDER — INSULIN DETEMIR 100 UNIT/ML ~~LOC~~ SOLN
20.0000 [IU] | Freq: Every day | SUBCUTANEOUS | Status: DC
  Administered 2020-02-19 – 2020-02-21 (×3): 20 [IU] via SUBCUTANEOUS
  Filled 2020-02-18 (×3): qty 0.2

## 2020-02-18 MED ORDER — ENOXAPARIN SODIUM 100 MG/ML ~~LOC~~ SOLN
100.0000 mg | Freq: Two times a day (BID) | SUBCUTANEOUS | Status: DC
  Administered 2020-02-18 – 2020-03-14 (×50): 100 mg via SUBCUTANEOUS
  Filled 2020-02-18 (×51): qty 1

## 2020-02-18 MED ORDER — ALPRAZOLAM 0.5 MG PO TABS
0.5000 mg | ORAL_TABLET | Freq: Every evening | ORAL | Status: DC | PRN
  Administered 2020-02-18 – 2020-02-24 (×7): 0.5 mg via ORAL
  Filled 2020-02-18 (×9): qty 1

## 2020-02-18 MED ORDER — ALPRAZOLAM 0.5 MG PO TABS
0.5000 mg | ORAL_TABLET | Freq: Three times a day (TID) | ORAL | Status: DC | PRN
  Administered 2020-02-18 – 2020-02-26 (×19): 0.5 mg via ORAL
  Filled 2020-02-18 (×20): qty 1

## 2020-02-18 MED ORDER — INSULIN ASPART 100 UNIT/ML ~~LOC~~ SOLN
8.0000 [IU] | Freq: Three times a day (TID) | SUBCUTANEOUS | Status: DC
  Administered 2020-02-18 – 2020-02-21 (×11): 8 [IU] via SUBCUTANEOUS

## 2020-02-18 NOTE — Progress Notes (Signed)
ANTICOAGULATION CONSULT NOTE - Follow Up Consult  Pharmacy Consult for Lovenox Indication: acute pulmonary embolus  No Known Allergies  Patient Measurements: Height: 5\' 8"  (172.7 cm) Weight: 98.9 kg (218 lb) IBW/kg (Calculated) : 63.9  Vital Signs: Temp: 99 F (37.2 C) (01/10 1223) Temp Source: Oral (01/10 1223) BP: 101/64 (01/10 1223) Pulse Rate: 131 (01/10 1223)  Labs: Recent Labs    02/18/20 0515  CREATININE 0.59    Estimated Creatinine Clearance: 95.4 mL/min (by C-G formula based on SCr of 0.59 mg/dL).   Medical History: Past Medical History:  Diagnosis Date  . Depression   . Dyslipidemia     Assessment: 60 yo F admitted on 12/27 with COVID PNA.  She has been on Lovenox since admission for VTE px.  Dose was briefly increased to 1mg /kg q12h x2 doses on 12/30 & 31 due to spike in DDimer but reduced back to px dose when dopplers were negative for DVT.    Chest CTa 11/4  + bilateral PE without evidence of right heart strain.  Repeat dopplers remain negative for DVT.  Pharmacy consulted to change to full-dose Lovenox.  Today, 02/18/2020: - last cbc obtained on 1/6 was stable - no bleeding documented - scr stable  Goal of Therapy:  Anti-Xa level 0.6-1 units/ml 4hrs after LMWH dose given Monitor platelets by anticoagulation protocol: Yes   Plan:  - Continue Lovenox 100mg  SQ q12h -  CBC at least q72h - Follow up plans for transition to oral [facility]  04/17/2020, PharmD, BCPS 02/18/2020 1:25 PM

## 2020-02-18 NOTE — Progress Notes (Signed)
PROGRESS NOTE  Christina Vincent  TDV:[id number] DOB: 1960-08-09 DOA: 02/04/2020 PCP: Avon Gully, MD   Brief Narrative: Christina Vincent is a 60 y.o. female with a history of T2DM, HTN, HLD, obesity and depression who presented with fever and dyspnea found to have positive SARS-CoV-2 PCR and mild hypoxia despite clear CXR on 12/27. CRP 16.6, PCT negative. Full scope treatment commenced with subsequent worsening of hypoxia. CTA chest 1/4 revealed worsening airspace disease compatible with progressive covid pneumonia as well as acute bilateral pulmonary emboli without right heart strain. Severe hypoxemia continues.  Assessment & Plan: Principal Problem:   2019 novel coronavirus disease (COVID-19) Active Problems:   Hypertension   Type 2 diabetes mellitus (HCC)   Dyslipidemia   Depression   Class 1 obesity   Acute hypoxemic respiratory failure due to COVID-19 [facility])  Acute hypoxemic respiratory failure due to covid-19 pneumonia: SARS-CoV-2 positive on 02/04/2020.  - Completed remdesivir - Steroids are tapered. - Continue baricitinib x14 days (last planned dose is 02/19/2020) - Encourage OOB, IS, FV, and awake proning if able - Continue airborne, contact precautions for 21 days from positive testing.  Segmental PE: No right heart strain. - Continue lovenox 1mg /kg q12h.   T2DM with steroid-induced hyperglycemia: HbA1c 7.7%.  - Continue levemir 20u BID, glycemic control overall improving. - Deescalate mealtime novolog, continue resistant scale. Will plan to deescalate levemir beginning this evening now that steroids tapered. - Continue linagliptin  HTN:  - Continue losartan  ALT elevation: Mild.  - Holding statin, continue monitoring.  Depression, insomnia:  - Continue remeron. Added trazodone  Anxiety: Exacerbated by hospitalization, particularly at night.  - Pt amenable to increasing dose of xanax which I've recommended for days. Ordered.  Mouth pain: No definitive evidence of  thrush, more consistent with irritation from high flow oxygen by mask for extended period of time. Resolved. - prn magic mouthwash.    Stress > urge urinary incontinence:  - Pt declines initiation of medication, will continue lifestyle modifications.  Obesity: Estimated body mass index is 33.15 kg/m as calculated from the following:   Height as of this encounter: 5\' 8"  (1.727 m).   Weight as of this encounter: 98.9 kg.  DVT prophylaxis: Lovenox Code Status: Full Family Communication: Husband by phone daily Disposition Plan:  Status is: Inpatient  Remains inpatient appropriate because:Inpatient level of care appropriate due to severity of illness  Dispo:  Patient From: Home  Planned Disposition: Home with Health Care Svc  Expected discharge date: >3 days  Medically stable for discharge: No  Consultants:   None  Procedures:   None  Antimicrobials:  Remdesivir   Subjective: Anxiety getting worse today and overnight, willing to take higher than minimum dose xanax. No change in hypoxia or respiratory effort, but is mod-severely short of breath constantly and worse with exertion. Getting to [facility] independently. No chest pain.  Objective: Vitals:   02/17/20 2000 02/18/20 0130 02/18/20 0531 02/18/20 0800  BP: 110/69  96/61   Pulse: (!) 104  96   Resp: 20  20   Temp: 97.9 F (36.6 C)  97.7 F (36.5 C)   TempSrc: Axillary  Oral   SpO2: 94% 94% 95% 92%  Weight:      Height:        Intake/Output Summary (Last 24 hours) at 02/18/2020 1119 Last data filed at 02/18/2020 0600 Gross per 24 hour  Intake 840 ml  Output 1900 ml  Net -1060 ml   Filed Weights   02/04/20  1838  Weight: 98.9 kg   Gen: 60 y.o. female in no distress Pulm: Nonlabored at rest but tachypneic with scant crackles. CV: Regular rate and rhythm. No murmur, rub, or gallop. No JVD, no dependent edema. GI: Abdomen soft, non-tender, non-distended, with normoactive bowel sounds.  Ext: Warm, no  deformities Skin: No rashes, lesions or ulcers on visualized skin. Neuro: Alert and oriented. No focal neurological deficits. Psych: Judgement and insight appear fair. Mood anxious & affect congruent. Behavior is appropriate.    Data Reviewed: I have personally reviewed following labs and imaging studies  CBC: Recent Labs  Lab 02/12/20 0421 02/13/20 0651 02/14/20 0539  WBC 18.1* 23.0* 25.2*  NEUTROABS 17.0* 21.7* 23.8*  HGB 11.6* 12.1 11.9*  HCT 35.7* 37.4 36.8  MCV 86.9 87.2 87.6  PLT 213 292 295   Basic Metabolic Panel: Recent Labs  Lab 02/12/20 0421 02/13/20 0651 02/14/20 0539 02/18/20 0515  NA 138 137 136 137  K 4.2 4.3 4.2 4.7  CL 99 98 100 97*  CO2 26 26 26 29   GLUCOSE 216* 230* 194* 126*  BUN 17 17 19 16   CREATININE 0.66 0.56 0.69 0.59  CALCIUM 8.6* 8.5* 8.4* 8.7*  MG 2.5* 2.7* 2.5*  --    GFR: Estimated Creatinine Clearance: 95.4 mL/min (by C-G formula based on SCr of 0.59 mg/dL). Liver Function Tests: Recent Labs  Lab 02/12/20 0421 02/13/20 0651 02/14/20 0539 02/18/20 0515  AST 19 22 20  33  ALT 22 29 25  51*  ALKPHOS 87 102 108 91  BILITOT 0.6 0.9 0.9 1.1  PROT 7.1 7.6 6.9 7.3  ALBUMIN 2.7* 2.9* 2.6* 2.7*   No results for input(s): LIPASE, AMYLASE in the last 168 hours. No results for input(s): AMMONIA in the last 168 hours. Coagulation Profile: No results for input(s): INR, PROTIME in the last 168 hours. Cardiac Enzymes: No results for input(s): CKTOTAL, CKMB, CKMBINDEX, TROPONINI in the last 168 hours. BNP (last 3 results) No results for input(s): PROBNP in the last 8760 hours. HbA1C: No results for input(s): HGBA1C in the last 72 hours. CBG: Recent Labs  Lab 02/17/20 1159 02/17/20 1651 02/17/20 1727 02/17/20 2101 02/18/20 0736  GLUCAP 271* 69* 99 192* 154*   Lipid Profile: No results for input(s): CHOL, HDL, LDLCALC, TRIG, CHOLHDL, LDLDIRECT in the last 72 hours. Thyroid Function Tests: No results for input(s): TSH, T4TOTAL,  FREET4, T3FREE, THYROIDAB in the last 72 hours. Anemia Panel: No results for input(s): VITAMINB12, FOLATE, FERRITIN, TIBC, IRON, RETICCTPCT in the last 72 hours. Urine analysis: No results found for: COLORURINE, APPEARANCEUR, LABSPEC, PHURINE, GLUCOSEU, HGBUR, BILIRUBINUR, KETONESUR, PROTEINUR, UROBILINOGEN, NITRITE, LEUKOCYTESUR No results found for this or any previous visit (from the past 240 hour(s)).    Radiology Studies: No results found.  Scheduled Meds: . albuterol  2 puff Inhalation Q6H  . vitamin C  500 mg Oral Daily  . baricitinib  4 mg Oral Daily  . benzonatate  200 mg Oral BID  . chlorpheniramine-HYDROcodone  5 mL Oral QHS  . enoxaparin (LOVENOX) injection  100 mg Subcutaneous Q12H  . insulin aspart  0-20 Units Subcutaneous TID WC  . insulin aspart  8 Units Subcutaneous TID WC  . insulin detemir  20 Units Subcutaneous BID  . linagliptin  5 mg Oral Daily  . mirtazapine  7.5 mg Oral QHS  . traZODone  50 mg Oral QHS  . zinc sulfate  220 mg Oral Daily   Continuous Infusions:   LOS: 13 days   Time  spent: 35 minutes.  Tyrone Nine, MD [facility] [email or web address] 02/18/2020, 11:19 AM

## 2020-02-18 NOTE — Plan of Care (Signed)
  Problem: Clinical Measurements: Goal: Will remain free from infection Outcome: Progressing Goal: Respiratory complications will improve Outcome: Progressing   Problem: Activity: Goal: Risk for activity intolerance will decrease Outcome: Progressing   Problem: Coping: Goal: Psychosocial and spiritual needs will be supported Outcome: Progressing   Problem: Respiratory: Goal: Will maintain a patent airway Outcome: Progressing

## 2020-02-19 ENCOUNTER — Encounter: Payer: Self-pay | Admitting: Internal Medicine

## 2020-02-19 ENCOUNTER — Telehealth: Payer: Self-pay | Admitting: Internal Medicine

## 2020-02-19 LAB — CBC
HCT: 40.9 % (ref 36.0–46.0)
Hemoglobin: 13 g/dL (ref 12.0–15.0)
MCH: 28.8 pg (ref 26.0–34.0)
MCHC: 31.8 g/dL (ref 30.0–36.0)
MCV: 90.7 fL (ref 80.0–100.0)
Platelets: 314 10*3/uL (ref 150–400)
RBC: 4.51 MIL/uL (ref 3.87–5.11)
RDW: 13.9 % (ref 11.5–15.5)
WBC: 17.2 10*3/uL — ABNORMAL HIGH (ref 4.0–10.5)
nRBC: 0 % (ref 0.0–0.2)

## 2020-02-19 LAB — GLUCOSE, CAPILLARY
Glucose-Capillary: 126 mg/dL — ABNORMAL HIGH (ref 70–99)
Glucose-Capillary: 206 mg/dL — ABNORMAL HIGH (ref 70–99)
Glucose-Capillary: 125 mg/dL — ABNORMAL HIGH (ref 70–99)
Glucose-Capillary: 80 mg/dL (ref 70–99)

## 2020-02-19 NOTE — Telephone Encounter (Signed)
Noted. Called # in chart, no answer and VM full.  Called endo and made aware to cancel.  Misty Stanley, please schedule OV and mail letter. Thanks

## 2020-02-19 NOTE — Telephone Encounter (Signed)
Yes on cancellation of upcoming procedure.  And yes patient needs an office visit at some point in the future before rescheduling

## 2020-02-19 NOTE — Plan of Care (Signed)
  Problem: Clinical Measurements: Goal: Will remain free from infection Outcome: Progressing Goal: Respiratory complications will improve Outcome: Progressing   Problem: Activity: Goal: Risk for activity intolerance will decrease Outcome: Progressing   Problem: Coping: Goal: Level of anxiety will decrease Outcome: Progressing   Problem: Pain Managment: Goal: General experience of comfort will improve Outcome: Progressing   Problem: Safety: Goal: Ability to remain free from injury will improve Outcome: Progressing   Problem: Skin Integrity: Goal: Risk for impaired skin integrity will decrease Outcome: Progressing   

## 2020-02-19 NOTE — Telephone Encounter (Signed)
Spoke with patient spouse and is aware of recs. He voiced understanding and aware appt will be scheduled and mailed.

## 2020-02-19 NOTE — Plan of Care (Signed)
  Problem: Health Behavior/Discharge Planning: Goal: Ability to manage health-related needs will improve Outcome: Progressing   Problem: Clinical Measurements: Goal: Ability to maintain clinical measurements within normal limits will improve Outcome: Progressing Goal: Will remain free from infection Outcome: Progressing Goal: Diagnostic test results will improve Outcome: Progressing Goal: Respiratory complications will improve Outcome: Progressing Goal: Cardiovascular complication will be avoided Outcome: Progressing   Problem: Activity: Goal: Risk for activity intolerance will decrease Outcome: Progressing   Problem: Coping: Goal: Level of anxiety will decrease Outcome: Progressing   Problem: Elimination: Goal: Will not experience complications related to bowel motility Outcome: Progressing Goal: Will not experience complications related to urinary retention Outcome: Progressing   Problem: Pain Managment: Goal: General experience of comfort will improve Outcome: Progressing   Problem: Safety: Goal: Ability to remain free from injury will improve Outcome: Progressing   Problem: Skin Integrity: Goal: Risk for impaired skin integrity will decrease Outcome: Progressing   Problem: Education: Goal: Knowledge of risk factors and measures for prevention of condition will improve Outcome: Progressing   Problem: Coping: Goal: Psychosocial and spiritual needs will be supported Outcome: Progressing   Problem: Respiratory: Goal: Will maintain a patent airway Outcome: Progressing Goal: Complications related to the disease process, condition or treatment will be avoided or minimized Outcome: Progressing   

## 2020-02-19 NOTE — Telephone Encounter (Signed)
Patient currently admitted resp failure. She was also covid + on 12/27. Last OV 11/19. Patient has also rescheduled procedure x 1 prior. Dr. Jena Gauss, does patient need OV to reschedule? Thanks

## 2020-02-19 NOTE — Progress Notes (Signed)
Physical Therapy Treatment Patient Details Name: Christina Vincent MRN: [id number] DOB: 12/04/1960 Today's Date: 02/19/2020    History of Present Illness 60 y.o. female with medical history significant of depression, dyslipidemia, type 2 diabetes, class I obesity, hypertension who is coming to the emergency department due to progressively worse shortness of breath for the past 6 days associated with fever, chills, fatigue, malaise, sore throat, occasionally productive of whitish/clear dry cough and rhinorrhea.  Patient is unvaccinated and has been admitted for acute hypoxemic respiratory failure secondary to COVID-19.  Pt also found to have PE 02/12/20    PT Comments    Pt extremely fatigued and dyspneic after transfer bed to chair with NT. SpO2= low 70s. After rest period,  pt able to perform gentle exercises with rest breaks.  SpO2=74% with LE exercise, 6 minutes to recover to 82-88%. Educated on proper diaphragmatic breathing. 15L PRB.  Follow Up Recommendations  Home health PT;Supervision for mobility/OOB     Equipment Recommendations  Other (comment) (rollator, pt states her dtr can get w/c)    Recommendations for Other Services       Precautions / Restrictions Precautions Precautions: Fall Precaution Comments: monitor sats Restrictions Weight Bearing Restrictions: No    Mobility  Bed Mobility               General bed mobility comments: pt just up to chair with nursing.  Transfers                 General transfer comment: deferred d/t pt fatigue and incr WOB  Ambulation/Gait             General Gait Details: deferred   Stairs             Wheelchair Mobility    Modified Rankin (Stroke Patients Only)       Balance                                            Cognition Arousal/Alertness: Awake/alert Behavior During Therapy: WFL for tasks assessed/performed Overall Cognitive Status: Within Functional Limits for tasks  assessed                                        Exercises General Exercises - Lower Extremity Long Arc Quad: AROM;Both;5 reps;Seated (5 reps x2 rest between) Toe Raises: AROM;10 reps;Seated;Both Heel Raises: AROM;Both;5 reps;Seated    General Comments        Pertinent Vitals/Pain Pain Assessment: No/denies pain    Home Living                      Prior Function            PT Goals (current goals can now be found in the care plan section) Acute Rehab PT Goals Patient Stated Goal: to be independent again PT Goal Formulation: With patient Time For Goal Achievement: 02/27/20 Potential to Achieve Goals: Fair Progress towards PT goals: Progressing toward goals    Frequency    Min 3X/week      PT Plan Current plan remains appropriate    Co-evaluation              AM-PAC PT "6 Clicks" Mobility   Outcome Measure  Help needed turning from your back  to your side while in a flat bed without using bedrails?: A Little Help needed moving from lying on your back to sitting on the side of a flat bed without using bedrails?: A Little Help needed moving to and from a bed to a chair (including a wheelchair)?: A Little Help needed standing up from a chair using your arms (e.g., wheelchair or bedside chair)?: A Little Help needed to walk in hospital room?: A Lot Help needed climbing 3-5 steps with a railing? : A Lot 6 Click Score: 16    End of Session Equipment Utilized During Treatment: Oxygen Activity Tolerance: Patient limited by fatigue Patient left: in chair;with call bell/phone within reach Nurse Communication: Mobility status PT Visit Diagnosis: Difficulty in walking, not elsewhere classified (R26.2);Other abnormalities of gait and mobility (R26.89)     Time: 1206-1226 PT Time Calculation (min) (ACUTE ONLY): 20 min  Charges:  $Therapeutic Exercise: 8-22 mins                     Delice Bison, PT  Acute Rehab Dept [facility])  [phone] Pager [phone]  02/19/2020    [facility] 02/19/2020, 1:48 PM

## 2020-02-19 NOTE — Telephone Encounter (Signed)
PATIENT SCHEDULED  °

## 2020-02-19 NOTE — Telephone Encounter (Signed)
Patient husband called and said that patient is currently in the hospital and she will need to reschedule her procedure

## 2020-02-19 NOTE — Plan of Care (Signed)
  Problem: Health Behavior/Discharge Planning: Goal: Ability to manage health-related needs will improve 02/19/2020 1352 by Piedad Climes D, RN Outcome: Progressing 02/19/2020 1342 by Piedad Climes D, RN Outcome: Progressing   Problem: Clinical Measurements: Goal: Ability to maintain clinical measurements within normal limits will improve 02/19/2020 1352 by Piedad Climes D, RN Outcome: Progressing 02/19/2020 1342 by Piedad Climes D, RN Outcome: Progressing Goal: Will remain free from infection 02/19/2020 1352 by Piedad Climes D, RN Outcome: Progressing 02/19/2020 1342 by Piedad Climes D, RN Outcome: Progressing Goal: Diagnostic test results will improve 02/19/2020 1352 by Rayburn Go, RN Outcome: Progressing 02/19/2020 1342 by Piedad Climes D, RN Outcome: Progressing Goal: Respiratory complications will improve 02/19/2020 1352 by Rayburn Go, RN Outcome: Progressing 02/19/2020 1342 by Piedad Climes D, RN Outcome: Progressing Goal: Cardiovascular complication will be avoided 02/19/2020 1352 by Piedad Climes D, RN Outcome: Progressing 02/19/2020 1342 by Piedad Climes D, RN Outcome: Progressing   Problem: Activity: Goal: Risk for activity intolerance will decrease 02/19/2020 1352 by Piedad Climes D, RN Outcome: Progressing 02/19/2020 1342 by Piedad Climes D, RN Outcome: Progressing   Problem: Coping: Goal: Level of anxiety will decrease 02/19/2020 1352 by Piedad Climes D, RN Outcome: Progressing 02/19/2020 1342 by Piedad Climes D, RN Outcome: Progressing   Problem: Elimination: Goal: Will not experience complications related to bowel motility 02/19/2020 1352 by Rayburn Go, RN Outcome: Progressing 02/19/2020 1342 by Piedad Climes D, RN Outcome: Progressing Goal: Will not experience complications related to urinary retention 02/19/2020 1352 by Rayburn Go, RN Outcome: Progressing 02/19/2020 1342 by Piedad Climes D, RN Outcome: Progressing    Problem: Pain Managment: Goal: General experience of comfort will improve 02/19/2020 1352 by Rayburn Go, RN Outcome: Progressing 02/19/2020 1342 by Piedad Climes D, RN Outcome: Progressing   Problem: Safety: Goal: Ability to remain free from injury will improve 02/19/2020 1352 by Piedad Climes D, RN Outcome: Progressing 02/19/2020 1342 by Piedad Climes D, RN Outcome: Progressing   Problem: Skin Integrity: Goal: Risk for impaired skin integrity will decrease 02/19/2020 1352 by Piedad Climes D, RN Outcome: Progressing 02/19/2020 1342 by Piedad Climes D, RN Outcome: Progressing   Problem: Education: Goal: Knowledge of risk factors and measures for prevention of condition will improve 02/19/2020 1352 by Rayburn Go, RN Outcome: Progressing 02/19/2020 1342 by Piedad Climes D, RN Outcome: Progressing   Problem: Coping: Goal: Psychosocial and spiritual needs will be supported 02/19/2020 1352 by Piedad Climes D, RN Outcome: Progressing 02/19/2020 1342 by Piedad Climes D, RN Outcome: Progressing   Problem: Respiratory: Goal: Will maintain a patent airway 02/19/2020 1352 by Piedad Climes D, RN Outcome: Progressing 02/19/2020 1342 by Piedad Climes D, RN Outcome: Progressing Goal: Complications related to the disease process, condition or treatment will be avoided or minimized 02/19/2020 1352 by Rayburn Go, RN Outcome: Progressing 02/19/2020 1342 by Rayburn Go, RN Outcome: Progressing

## 2020-02-19 NOTE — Progress Notes (Signed)
PROGRESS NOTE  Christina Vincent  SNK:[id number] DOB: 08/02/1960 DOA: 02/04/2020 PCP: Avon Gully, MD   Brief Narrative: Christina Vincent is a 60 y.o. female with a history of T2DM, HTN, HLD, obesity and depression who presented with fever and dyspnea found to have positive SARS-CoV-2 PCR and mild hypoxia despite clear CXR on 12/27. CRP 16.6, PCT negative. Full scope treatment commenced with subsequent worsening of hypoxia. CTA chest 1/4 revealed worsening airspace disease compatible with progressive covid pneumonia as well as acute bilateral pulmonary emboli without right heart strain. Severe hypoxemia continues.  Assessment & Plan: Principal Problem:   2019 novel coronavirus disease (COVID-19) Active Problems:   Hypertension   Type 2 diabetes mellitus (HCC)   Dyslipidemia   Depression   Class 1 obesity   Acute hypoxemic respiratory failure due to COVID-19 [facility])  Acute hypoxemic respiratory failure due to covid-19 pneumonia: SARS-CoV-2 positive on 02/04/2020.  - Completed remdesivir - Steroids have been tapered off. - Completed baricitinib x14 days (02/19/2020) - Encourage OOB, IS, FV, and awake proning if able - Continue airborne, contact precautions for 21 days from positive testing.  Segmental PE: No right heart strain. - Continue lovenox 1mg /kg q12h.   T2DM with steroid-induced hyperglycemia: HbA1c 7.7%.  - Continue levemir 20u daily (decreased), glycemic control overall improving. - Continue mealtime + resistant scale.   - Continue linagliptin  HTN:  - Continue losartan  ALT elevation: Mild.  - Holding statin, continue monitoring.  Depression, insomnia:  - Continue remeron. Added trazodone  Anxiety: Exacerbated by hospitalization, particularly at night.  - Continue xanax 0.5mg  prn (improving symptoms dramatically without sedation)  Mouth pain: No definitive evidence of thrush, more consistent with irritation from high flow oxygen by mask for extended period of time.    - prn magic mouthwash. Resolved.    Stress > urge urinary incontinence:  - Pt declines initiation of medication, will continue lifestyle modifications.  Obesity: Estimated body mass index is 33.15 kg/m as calculated from the following:   Height as of this encounter: 5\' 8"  (1.727 m).   Weight as of this encounter: 98.9 kg.  DVT prophylaxis: Lovenox Code Status: Full Family Communication: Husband by phone daily Disposition Plan:  Status is: Inpatient  Remains inpatient appropriate because:Inpatient level of care appropriate due to severity of illness  Dispo:  Patient From: Home  Planned Disposition: Home with Health Care Svc  Expected discharge date: >3 days  Medically stable for discharge: No  Consultants:   None  Procedures:   None  Antimicrobials:  Remdesivir   Subjective: Got much more sleep last night, still with stable shortness of breath without any other complaints.   Objective: Vitals:   02/19/20 0447 02/19/20 0534 02/19/20 0803 02/19/20 1319  BP: (!) 106/53 113/65 99/63 105/74  Pulse: (!) 107 100 100 (!) 110  Resp: 20 20 14 16   Temp: 98.5 F (36.9 C) 98 F (36.7 C) 98.5 F (36.9 C) 99.3 F (37.4 C)  TempSrc: Oral Axillary Oral Oral  SpO2: 93% 90% (!) 87% 90%  Weight:      Height:        Intake/Output Summary (Last 24 hours) at 02/19/2020 1651 Last data filed at 02/19/2020 0600 Gross per 24 hour  Intake 360 ml  Output 1450 ml  Net -1090 ml   Filed Weights   02/04/20 1838  Weight: 98.9 kg   Gen: 60 y.o. female in no distress Pulm: Mildly labored with tachypnea at rest on 15L NRB. No wheezes. CV:  Regular rate and rhythm. No murmur, rub, or gallop. No JVD, no dependent edema. GI: Abdomen soft, non-tender, non-distended, with normoactive bowel sounds.  Ext: Warm, no deformities Skin: No rashes, lesions or ulcers on visualized skin. Neuro: Alert and oriented. No focal neurological deficits. Psych: Judgement and insight appear fair. Mood  anxious & affect congruent. Behavior is appropriate.    Data Reviewed: I have personally reviewed following labs and imaging studies  CBC: Recent Labs  Lab 02/13/20 0651 02/14/20 0539 02/19/20 0458  WBC 23.0* 25.2* 17.2*  NEUTROABS 21.7* 23.8*  --   HGB 12.1 11.9* 13.0  HCT 37.4 36.8 40.9  MCV 87.2 87.6 90.7  PLT 292 295 314   Basic Metabolic Panel: Recent Labs  Lab 02/13/20 0651 02/14/20 0539 02/18/20 0515  NA 137 136 137  K 4.3 4.2 4.7  CL 98 100 97*  CO2 26 26 29   GLUCOSE 230* 194* 126*  BUN 17 19 16   CREATININE 0.56 0.69 0.59  CALCIUM 8.5* 8.4* 8.7*  MG 2.7* 2.5*  --    GFR: Estimated Creatinine Clearance: 95.4 mL/min (by C-G formula based on SCr of 0.59 mg/dL). Liver Function Tests: Recent Labs  Lab 02/13/20 0651 02/14/20 0539 02/18/20 0515  AST 22 20 33  ALT 29 25 51*  ALKPHOS 102 108 91  BILITOT 0.9 0.9 1.1  PROT 7.6 6.9 7.3  ALBUMIN 2.9* 2.6* 2.7*    Scheduled Meds: . albuterol  2 puff Inhalation Q6H  . vitamin C  500 mg Oral Daily  . benzonatate  200 mg Oral BID  . chlorpheniramine-HYDROcodone  5 mL Oral QHS  . enoxaparin (LOVENOX) injection  100 mg Subcutaneous BID  . insulin aspart  0-20 Units Subcutaneous TID WC  . insulin aspart  8 Units Subcutaneous TID WC  . insulin detemir  20 Units Subcutaneous Daily  . linagliptin  5 mg Oral Daily  . mirtazapine  7.5 mg Oral QHS  . traZODone  50 mg Oral QHS  . zinc sulfate  220 mg Oral Daily   Continuous Infusions:   LOS: 14 days   Time spent: 35 minutes.  04/13/20, MD [facility] [email or web address] 02/19/2020, 4:51 PM

## 2020-02-20 LAB — COMPREHENSIVE METABOLIC PANEL
ALT: 86 U/L — ABNORMAL HIGH (ref 0–44)
AST: 42 U/L — ABNORMAL HIGH (ref 15–41)
Albumin: 2.5 g/dL — ABNORMAL LOW (ref 3.5–5.0)
Alkaline Phosphatase: 93 U/L (ref 38–126)
Anion gap: 12 (ref 5–15)
BUN: 14 mg/dL (ref 6–20)
CO2: 29 mmol/L (ref 22–32)
Calcium: 8.5 mg/dL — ABNORMAL LOW (ref 8.9–10.3)
Chloride: 95 mmol/L — ABNORMAL LOW (ref 98–111)
Creatinine, Ser: 0.62 mg/dL (ref 0.44–1.00)
GFR, Estimated: >60 mL/min (ref >60)
Glucose, Bld: 116 mg/dL — ABNORMAL HIGH (ref 70–99)
Potassium: 4.5 mmol/L (ref 3.5–5.1)
Sodium: 136 mmol/L (ref 135–145)
Total Bilirubin: 1 mg/dL (ref 0.3–1.2)
Total Protein: 7.1 g/dL (ref 6.5–8.1)

## 2020-02-20 LAB — GLUCOSE, CAPILLARY
Glucose-Capillary: 112 mg/dL — ABNORMAL HIGH (ref 70–99)
Glucose-Capillary: 110 mg/dL — ABNORMAL HIGH (ref 70–99)
Glucose-Capillary: 113 mg/dL — ABNORMAL HIGH (ref 70–99)
Glucose-Capillary: 128 mg/dL — ABNORMAL HIGH (ref 70–99)
Glucose-Capillary: 114 mg/dL — ABNORMAL HIGH (ref 70–99)

## 2020-02-20 NOTE — Progress Notes (Signed)
I spoke with pt by phone due to contact precautions. Pt states she is hanging in there. She said she has good days and bad. We talked about the power of the word of God and having a positive altitude helps our bodies to heal. The chaplain offered caring and supportive presence, prayers and blessings. Pt requested a bible and I gave nurse one to take into pt's room. Pt was grateful for the call.

## 2020-02-20 NOTE — Plan of Care (Signed)
  Problem: Health Behavior/Discharge Planning: Goal: Ability to manage health-related needs will improve Outcome: Progressing   Problem: Clinical Measurements: Goal: Ability to maintain clinical measurements within normal limits will improve Outcome: Progressing Goal: Will remain free from infection Outcome: Progressing Goal: Diagnostic test results will improve Outcome: Progressing Goal: Respiratory complications will improve Outcome: Progressing Goal: Cardiovascular complication will be avoided Outcome: Progressing   Problem: Activity: Goal: Risk for activity intolerance will decrease Outcome: Progressing   Problem: Coping: Goal: Level of anxiety will decrease Outcome: Progressing   Problem: Elimination: Goal: Will not experience complications related to bowel motility Outcome: Progressing Goal: Will not experience complications related to urinary retention Outcome: Progressing   Problem: Pain Managment: Goal: General experience of comfort will improve Outcome: Progressing   Problem: Safety: Goal: Ability to remain free from injury will improve Outcome: Progressing   Problem: Skin Integrity: Goal: Risk for impaired skin integrity will decrease Outcome: Progressing   Problem: Education: Goal: Knowledge of risk factors and measures for prevention of condition will improve Outcome: Progressing   Problem: Coping: Goal: Psychosocial and spiritual needs will be supported Outcome: Progressing   Problem: Respiratory: Goal: Will maintain a patent airway Outcome: Progressing Goal: Complications related to the disease process, condition or treatment will be avoided or minimized Outcome: Progressing   

## 2020-02-20 NOTE — Progress Notes (Signed)
PROGRESS NOTE  Christina Vincent  FBP:[id number] DOB: 12/10/1960 DOA: 02/04/2020 PCP: Avon Gully, MD   Brief Narrative: Christina Vincent is a 60 y.o. female with a history of T2DM, HTN, HLD, obesity and depression who presented with fever and dyspnea found to have positive SARS-CoV-2 PCR and mild hypoxia despite clear CXR on 12/27. CRP 16.6, PCT negative. Full scope treatment commenced with subsequent worsening of hypoxia. CTA chest 1/4 revealed worsening airspace disease compatible with progressive covid pneumonia as well as acute bilateral pulmonary emboli without right heart strain. Severe hypoxemia continues.  Assessment & Plan: Principal Problem:   2019 novel coronavirus disease (COVID-19) Active Problems:   Hypertension   Type 2 diabetes mellitus (HCC)   Dyslipidemia   Depression   Class 1 obesity   Acute hypoxemic respiratory failure due to COVID-19 [facility])  Acute hypoxemic respiratory failure due to covid-19 pneumonia and PE: SARS-CoV-2 positive on 02/04/2020.  - Continues to require 15L O2. Recheck CXR in AM.  - Completed remdesivir - Completed steroid with taper and baricitinib x14 days (02/19/2020) - Encourage OOB, IS, FV, and awake proning if able - Continue airborne, contact precautions, can lift isolation 1/17.  Segmental PE: No right heart strain. - Continue lovenox 1mg /kg q12h.   T2DM with steroid-induced hyperglycemia: HbA1c 7.7%.  - Continue levemir 20u daily (decreased), glycemic control overall improving. - Continue mealtime + resistant scale.   - Continue linagliptin  HTN:  - Continue losartan  LFT elevation: Mild but rising on recheck.  - Holding statin, continue monitoring.  Depression, insomnia:  - Continue remeron, trazodone  Anxiety: Exacerbated by hospitalization, particularly at night.  - Continue xanax 0.5mg  prn (no sedation noted)  Mouth pain: Resolved.  - prn magic mouthwash.   Stress > urge urinary incontinence:  - Pt declines initiation of  medication, will continue lifestyle modifications.  Obesity: Estimated body mass index is 33.15 kg/m as calculated from the following:   Height as of this encounter: 5\' 8"  (1.727 m).   Weight as of this encounter: 98.9 kg.  DVT prophylaxis: Lovenox Code Status: Full Family Communication: Husband by phone daily Disposition Plan:  Status is: Inpatient  Remains inpatient appropriate because:Inpatient level of care appropriate due to severity of illness  Dispo:  Patient From: Home  Planned Disposition: Home with Health Care Svc  Expected discharge date: >3 days  Medically stable for discharge: No  Consultants:   None  Procedures:   None  Antimicrobials:  Remdesivir   Subjective: Continues to report improving anxiety symptoms, though husband feels she's less interested in talking. No sedation noted by pt or RN. Dyspnea is severe, stable, not much changed from prior. No chest pain or other new symptoms.  Objective: Vitals:   02/19/20 0803 02/19/20 1319 02/19/20 2058 02/20/20 0504  BP: 99/63 105/74 (!) 108/57 98/60  Pulse: 100 (!) 110 (!) 110 100  Resp: 14 16 16 16   Temp: 98.5 F (36.9 C) 99.3 F (37.4 C) (!) 97.4 F (36.3 C) 99.9 F (37.7 C)  TempSrc: Oral Oral Axillary Axillary  SpO2: (!) 87% 90% 93% 90%  Weight:      Height:        Intake/Output Summary (Last 24 hours) at 02/20/2020 1224 Last data filed at 02/20/2020 0600 Gross per 24 hour  Intake 120 ml  Output 900 ml  Net -780 ml   Filed Weights   02/04/20 1838  Weight: 98.9 kg   Gen: 60 y.o. female in no distress Pulm: Nonlabored at rest,  tachypneic with NRB, diminished. CV: Regular tachycardia without murmur, rub, or gallop. No JVD, no dependent edema. GI: Abdomen soft, non-tender, non-distended, with normoactive bowel sounds.  Ext: Warm, no deformities Skin: No rashes, lesions or ulcers on visualized skin. Neuro: Alert and oriented. No focal neurological deficits. Psych: Judgement and insight  appear fair. Mood euthymic & affect congruent. Behavior is appropriate.    Data Reviewed: I have personally reviewed following labs and imaging studies  CBC: Recent Labs  Lab 02/14/20 0539 02/19/20 0458  WBC 25.2* 17.2*  NEUTROABS 23.8*  --   HGB 11.9* 13.0  HCT 36.8 40.9  MCV 87.6 90.7  PLT 295 314   Basic Metabolic Panel: Recent Labs  Lab 02/14/20 0539 02/18/20 0515 02/20/20 0509  NA 136 137 136  K 4.2 4.7 4.5  CL 100 97* 95*  CO2 26 29 29   GLUCOSE 194* 126* 116*  BUN 19 16 14   CREATININE 0.69 0.59 0.62  CALCIUM 8.4* 8.7* 8.5*  MG 2.5*  --   --    GFR: Estimated Creatinine Clearance: 95.4 mL/min (by C-G formula based on SCr of 0.62 mg/dL). Liver Function Tests: Recent Labs  Lab 02/14/20 0539 02/18/20 0515 02/20/20 0509  AST 20 33 42*  ALT 25 51* 86*  ALKPHOS 108 91 93  BILITOT 0.9 1.1 1.0  PROT 6.9 7.3 7.1  ALBUMIN 2.6* 2.7* 2.5*    Scheduled Meds: . albuterol  2 puff Inhalation Q6H  . vitamin C  500 mg Oral Daily  . benzonatate  200 mg Oral BID  . chlorpheniramine-HYDROcodone  5 mL Oral QHS  . enoxaparin (LOVENOX) injection  100 mg Subcutaneous BID  . insulin aspart  0-20 Units Subcutaneous TID WC  . insulin aspart  8 Units Subcutaneous TID WC  . insulin detemir  20 Units Subcutaneous Daily  . linagliptin  5 mg Oral Daily  . mirtazapine  7.5 mg Oral QHS  . traZODone  50 mg Oral QHS  . zinc sulfate  220 mg Oral Daily   Continuous Infusions:   LOS: 15 days   Time spent: 35 minutes.  04/17/20, MD [email or web address] 02/20/2020, 12:24 PM

## 2020-02-20 NOTE — Plan of Care (Signed)
  Problem: Safety: Goal: Ability to remain free from injury will improve Outcome: Progressing   Problem: Skin Integrity: Goal: Risk for impaired skin integrity will decrease Outcome: Progressing   Problem: Education: Goal: Knowledge of risk factors and measures for prevention of condition will improve Outcome: Progressing   Problem: Respiratory: Goal: Will maintain a patent airway Outcome: Progressing Goal: Complications related to the disease process, condition or treatment will be avoided or minimized Outcome: Progressing

## 2020-02-21 ENCOUNTER — Inpatient Hospital Stay (HOSPITAL_COMMUNITY): Payer: 59

## 2020-02-21 LAB — GLUCOSE, CAPILLARY
Glucose-Capillary: 107 mg/dL — ABNORMAL HIGH (ref 70–99)
Glucose-Capillary: 205 mg/dL — ABNORMAL HIGH (ref 70–99)
Glucose-Capillary: 66 mg/dL — ABNORMAL LOW (ref 70–99)
Glucose-Capillary: 70 mg/dL (ref 70–99)
Glucose-Capillary: 176 mg/dL — ABNORMAL HIGH (ref 70–99)
Glucose-Capillary: 148 mg/dL — ABNORMAL HIGH (ref 70–99)

## 2020-02-21 NOTE — Progress Notes (Signed)
PROGRESS NOTE  MAGUIRE KILLMER  NWG:[id number] DOB: 22-Jul-1960 DOA: 02/04/2020 PCP: Avon Gully, MD   Brief Narrative: Christina Vincent is a 60 y.o. female with a history of T2DM, HTN, HLD, obesity and depression who presented with fever and dyspnea found to have positive SARS-CoV-2 PCR and mild hypoxia despite clear CXR on 12/27. CRP 16.6, PCT negative. Full scope treatment commenced with subsequent worsening of hypoxia. CTA chest 1/4 revealed worsening airspace disease compatible with progressive covid pneumonia as well as acute bilateral pulmonary emboli without right heart strain. Severe hypoxemia continues. CXR repeated 1/13 confirms extensive but stable infiltrates bilaterally.  Assessment & Plan: Principal Problem:   2019 novel coronavirus disease (COVID-19) Active Problems:   Hypertension   Type 2 diabetes mellitus (HCC)   Dyslipidemia   Depression   Class 1 obesity   Acute hypoxemic respiratory failure due to COVID-19 [facility])  Acute hypoxemic respiratory failure due to covid-19 pneumonia and PE: SARS-CoV-2 positive on 02/04/2020.  - Continues to require 15L O2. CXR on my personal review showing extensive primarily airspace opacities throughout lungs bilaterally. This closely approximates infiltrates seen on CTA chest at last check. No effusions noted, no PTX or pneumomediastinum to the best of my ability.  - Completed remdesivir - Completed steroid with taper and baricitinib x14 days (02/19/2020) - Encourage OOB, IS, FV, and awake proning if able - Continue airborne, contact precautions, can lift isolation 1/17. - Will reinitiate PT/OT/OOB as tolerated.  Segmental PE: No right heart strain. - Continue lovenox 1mg /kg q12h.   T2DM with steroid-induced hyperglycemia: HbA1c 7.7%.  - Continue levemir 20u daily  - Continue mealtime + resistant scale. CBGs very well controlled currently.  - Continue linagliptin  HTN:  - Continue losartan  LFT elevation: Mild but rising on recheck.   - Holding statin, continue monitoring in AM.  Depression, insomnia:  - Continue remeron, trazodone  Anxiety: Exacerbated by hospitalization, particularly at night.  - Continue xanax 0.5mg  prn (no sedation noted)  Mouth pain: Resolved.  - prn magic mouthwash.   Stress > urge urinary incontinence:  - Pt declines initiation of medication, will continue lifestyle modifications.  Obesity: Estimated body mass index is 33.15 kg/m as calculated from the following:   Height as of this encounter: 5\' 8"  (1.727 m).   Weight as of this encounter: 98.9 kg.  DVT prophylaxis: Lovenox Code Status: Full Family Communication: Husband by phone daily Disposition Plan:  Status is: Inpatient  Remains inpatient appropriate because:Inpatient level of care appropriate due to severity of illness  Dispo:  Patient From: Home  Planned Disposition: Home with Health Care Svc  Expected discharge date: >3 days  Medically stable for discharge: No  Consultants:   None  Procedures:   None  Antimicrobials:  Remdesivir   Subjective: Anxiety is moderate, improved with xanax without sedation. HFNC added and titrated down to 9L O2 though pt feels more anxious without mask so has this on constantly. No chest pain, cough, or new symptoms.   Objective: Vitals:   02/20/20 1508 02/20/20 1640 02/20/20 2250 02/21/20 0523  BP: (!) 95/57  96/62 (!) 106/57  Pulse: (!) 111  (!) 119 97  Resp:   20 20  Temp: (!) 100.9 F (38.3 C) 98.3 F (36.8 C) 99.8 F (37.7 C) 99.3 F (37.4 C)  TempSrc: Oral Oral Oral Oral  SpO2: 91%  91% (!) 89%  Weight:      Height:        Intake/Output Summary (Last 24 hours)  at 02/21/2020 1118 Last data filed at 02/21/2020 1000 Gross per 24 hour  Intake 120 ml  Output --  Net 120 ml   Filed Weights   02/04/20 1838  Weight: 98.9 kg   Gen: 59 y.o. female in no distress Pulm: Nonlabored tachypnea at rest, crackles bilaterally without wheezes. More diminished at bases than  prior.  CV: Regular rate and rhythm. No murmur, rub, or gallop. No JVD, no dependent edema. GI: Abdomen soft, non-tender, non-distended, with normoactive bowel sounds.  Ext: Warm, no deformities Skin: No rashes, lesions or ulcers on visualized skin. Neuro: Alert and oriented. No focal neurological deficits. Psych: Judgement and insight appear fair. Mood anxious & affect congruent and pleasant. Behavior is appropriate.    Data Reviewed: I have personally reviewed following labs and imaging studies  CBC: Recent Labs  Lab 02/19/20 0458  WBC 17.2*  HGB 13.0  HCT 40.9  MCV 90.7  PLT 314   Basic Metabolic Panel: Recent Labs  Lab 02/18/20 0515 02/20/20 0509  NA 137 136  K 4.7 4.5  CL 97* 95*  CO2 29 29  GLUCOSE 126* 116*  BUN 16 14  CREATININE 0.59 0.62  CALCIUM 8.7* 8.5*   GFR: Estimated Creatinine Clearance: 95.4 mL/min (by C-G formula based on SCr of 0.62 mg/dL). Liver Function Tests: Recent Labs  Lab 02/18/20 0515 02/20/20 0509  AST 33 42*  ALT 51* 86*  ALKPHOS 91 93  BILITOT 1.1 1.0  PROT 7.3 7.1  ALBUMIN 2.7* 2.5*    Scheduled Meds: . albuterol  2 puff Inhalation Q6H  . vitamin C  500 mg Oral Daily  . benzonatate  200 mg Oral BID  . chlorpheniramine-HYDROcodone  5 mL Oral QHS  . enoxaparin (LOVENOX) injection  100 mg Subcutaneous BID  . insulin aspart  0-20 Units Subcutaneous TID WC  . insulin aspart  8 Units Subcutaneous TID WC  . insulin detemir  20 Units Subcutaneous Daily  . linagliptin  5 mg Oral Daily  . mirtazapine  7.5 mg Oral QHS  . traZODone  50 mg Oral QHS  . zinc sulfate  220 mg Oral Daily   Continuous Infusions:   LOS: 16 days   Time spent: 35 minutes.  Tyrone Nine, MD [facility] [email or web address] 02/21/2020, 11:18 AM

## 2020-02-21 NOTE — Plan of Care (Signed)
  Problem: Health Behavior/Discharge Planning: Goal: Ability to manage health-related needs will improve Outcome: Progressing   Problem: Clinical Measurements: Goal: Ability to maintain clinical measurements within normal limits will improve Outcome: Progressing Goal: Will remain free from infection Outcome: Progressing Goal: Diagnostic test results will improve Outcome: Progressing Goal: Respiratory complications will improve Outcome: Progressing Goal: Cardiovascular complication will be avoided Outcome: Progressing   Problem: Activity: Goal: Risk for activity intolerance will decrease Outcome: Progressing   Problem: Coping: Goal: Level of anxiety will decrease Outcome: Progressing   Problem: Elimination: Goal: Will not experience complications related to bowel motility Outcome: Progressing Goal: Will not experience complications related to urinary retention Outcome: Progressing   Problem: Pain Managment: Goal: General experience of comfort will improve Outcome: Progressing   Problem: Safety: Goal: Ability to remain free from injury will improve Outcome: Progressing   Problem: Skin Integrity: Goal: Risk for impaired skin integrity will decrease Outcome: Progressing   Problem: Education: Goal: Knowledge of risk factors and measures for prevention of condition will improve Outcome: Progressing   Problem: Coping: Goal: Psychosocial and spiritual needs will be supported Outcome: Progressing   Problem: Respiratory: Goal: Will maintain a patent airway Outcome: Progressing Goal: Complications related to the disease process, condition or treatment will be avoided or minimized Outcome: Progressing   

## 2020-02-21 NOTE — Progress Notes (Signed)
Occupational Therapy Treatment Patient Details Name: Christina Vincent MRN: [id number] DOB: 1960-05-03 Today's Date: 02/21/2020    History of present illness 60 y.o. female with medical history significant of depression, dyslipidemia, type 2 diabetes, class I obesity, hypertension who is coming to the emergency department due to progressively worse shortness of breath for the past 6 days associated with fever, chills, fatigue, malaise, sore throat, occasionally productive of whitish/clear dry cough and rhinorrhea.  Patient is unvaccinated and has been admitted for acute hypoxemic respiratory failure secondary to COVID-19.  Pt also found to have PE 02/12/20   OT comments  Patient highly anxious with all mobility. Max encouragement for relaxation, PLB techniques and turned on music at end of session. Patient min G for functional transfer to [facility] then recliner, patient impulsive due to anxiety with poor eccentric control into recliner. Heart monitor reading desat to 59% on 7L HFNC and 15L NRB, therefore increased to 11L with recovery to low-mid 80s and RN made aware.    Follow Up Recommendations  Home health OT    Equipment Recommendations  3 in 1 bedside commode       Precautions / Restrictions Precautions Precautions: Fall Precaution Comments: monitor sats, anxious Restrictions Weight Bearing Restrictions: No       Mobility Bed Mobility Overal bed mobility: Needs Assistance Bed Mobility: Supine to Sit     Supine to sit: Min assist     General bed mobility comments: min A for trunk support  Transfers Overall transfer level: Needs assistance Equipment used: None Transfers: Sit to/from [employer] Transfers Sit to Stand: Supervision Stand pivot transfers: Min guard       General transfer comment: patient does grab out for OT upon initial stand to steady when transferring to [facility], then transfers impulsively to recliner    Balance Overall balance assessment: Mild deficits  observed, not formally tested                                         ADL either performed or assessed with clinical judgement   ADL Overall ADL's : Needs assistance/impaired                         Toilet Transfer: Min guard;Stand-pivot;Cueing for Chief of Staff Details (indicate cue type and reason): patient transfers very quickly from EOB to [facility] then to recliner. begins hyperventilating and stating she can't pee despite urine being in the commode. patient moving so quickly, does not initiate peri care and holds towel to peri area once seated in chair         Functional mobility during ADLs: Min guard General ADL Comments: encourage relaxation techniques, PLB techniques and turned on music once seated in chair to try and aid in recovery/decrease patient anxiety.               Cognition Arousal/Alertness: Awake/alert Behavior During Therapy: Anxious;Impulsive Overall Cognitive Status: Within Functional Limits for tasks assessed                                 General Comments: patient becomes highly anxious with activity, holding NRB closely and moving impulsively into the chair              General Comments patient monitor reading 59% on 7L HFNC and 15L  NRB, increased to 11L and max cues for relaxation with patient recovering to low-mid 80s    Pertinent Vitals/ Pain       Pain Assessment: Faces Faces Pain Scale: Hurts little more Pain Location: buttock Pain Descriptors / Indicators: Sore Pain Intervention(s): Repositioned         Frequency  Min 2X/week        Progress Toward Goals  OT Goals(current goals can now be found in the care plan section)  Progress towards OT goals: Progressing toward goals  Acute Rehab OT Goals Patient Stated Goal: to be independent again OT Goal Formulation: With patient Time For Goal Achievement: 02/28/20 Potential to Achieve Goals: Good ADL Goals Pt Will Perform Lower Body  Dressing: sit to/from stand;with modified independence Pt Will Transfer to Toilet: with modified independence;ambulating;regular height toilet;grab bars Pt Will Perform Toileting - Clothing Manipulation and hygiene: with modified independence;sit to/from stand Additional ADL Goal #1: Patient will stand at sink to perform grooming task as evidence of improving activity tolerance Additional ADL Goal #2: Patient will perform 10 min functional activity or exercise activity as evidence of improving activity tolerance  Plan Discharge plan remains appropriate       AM-PAC OT "6 Clicks" Daily Activity     Outcome Measure   Help from another person eating meals?: None Help from another person taking care of personal grooming?: None Help from another person toileting, which includes using toliet, bedpan, or urinal?: A Lot Help from another person bathing (including washing, rinsing, drying)?: A Little Help from another person to put on and taking off regular upper body clothing?: A Little Help from another person to put on and taking off regular lower body clothing?: A Little 6 Click Score: 19    End of Session Equipment Utilized During Treatment: Oxygen  OT Visit Diagnosis: Other (comment) (acute respiratory failure due to covid)   Activity Tolerance Other (comment) (Patient limited by anxiety)   Patient Left in chair;with call bell/phone within reach   Nurse Communication Mobility status;Other (comment) (O2 sats with activity)        Time: 6160-7371 OT Time Calculation (min): 28 min  Charges: OT General Charges $OT Visit: 1 Visit OT Treatments $Self Care/Home Management : 23-37 mins  Marlyce Huge OT OT pager: [phone]   Carmelia Roller 02/21/2020, 1:38 PM

## 2020-02-21 NOTE — Progress Notes (Signed)
PT Cancellation Note  Patient Details Name: Christina Vincent MRN: [id number] DOB: 1960-02-19   Cancelled Treatment:    Reason Eval/Treat Not Completed: Patient declined, no reason specified. Upon entry into room, pt R sidelying with nasal cannula and NRB on, "I'm having a panic attack". Therapist offered comfort, educated pt on SpO2 level 98%, and encouragement. Pt reports feeling less anxious, but declines PT treatment; RN notified.    Domenick Bookbinder PT, DPT 02/21/20, 4:05 PM

## 2020-02-21 NOTE — Progress Notes (Signed)
Pt 02 sats has been displaying fluctuating readings throughout the night. This RN has changed the 02 probe from the index finger to first finger to the right ear to the toe next to the great toe on left foot. Pt is currently resting comfortably on 8L02 at 94 percent.

## 2020-02-22 LAB — COMPREHENSIVE METABOLIC PANEL
ALT: 81 U/L — ABNORMAL HIGH (ref 0–44)
AST: 38 U/L (ref 15–41)
Albumin: 2.2 g/dL — ABNORMAL LOW (ref 3.5–5.0)
Alkaline Phosphatase: 104 U/L (ref 38–126)
Anion gap: 12 (ref 5–15)
BUN: 14 mg/dL (ref 6–20)
CO2: 31 mmol/L (ref 22–32)
Calcium: 8.6 mg/dL — ABNORMAL LOW (ref 8.9–10.3)
Chloride: 92 mmol/L — ABNORMAL LOW (ref 98–111)
Creatinine, Ser: 0.57 mg/dL (ref 0.44–1.00)
GFR, Estimated: >60 mL/min (ref >60)
Glucose, Bld: 109 mg/dL — ABNORMAL HIGH (ref 70–99)
Potassium: 4.4 mmol/L (ref 3.5–5.1)
Sodium: 135 mmol/L (ref 135–145)
Total Bilirubin: 0.6 mg/dL (ref 0.3–1.2)
Total Protein: 7.2 g/dL (ref 6.5–8.1)

## 2020-02-22 LAB — CBC
HCT: 35.9 % — ABNORMAL LOW (ref 36.0–46.0)
Hemoglobin: 11.6 g/dL — ABNORMAL LOW (ref 12.0–15.0)
MCH: 28.9 pg (ref 26.0–34.0)
MCHC: 32.3 g/dL (ref 30.0–36.0)
MCV: 89.3 fL (ref 80.0–100.0)
Platelets: 256 10*3/uL (ref 150–400)
RBC: 4.02 MIL/uL (ref 3.87–5.11)
RDW: 13.6 % (ref 11.5–15.5)
WBC: 14.5 10*3/uL — ABNORMAL HIGH (ref 4.0–10.5)
nRBC: 0 % (ref 0.0–0.2)

## 2020-02-22 LAB — GLUCOSE, CAPILLARY
Glucose-Capillary: 102 mg/dL — ABNORMAL HIGH (ref 70–99)
Glucose-Capillary: 172 mg/dL — ABNORMAL HIGH (ref 70–99)
Glucose-Capillary: 160 mg/dL — ABNORMAL HIGH (ref 70–99)
Glucose-Capillary: 185 mg/dL — ABNORMAL HIGH (ref 70–99)

## 2020-02-22 NOTE — Progress Notes (Signed)
PROGRESS NOTE  COY VANDOREN  DXI:[id number] DOB: [birth year] DOA: 02/04/2020 PCP: Avon Gully, MD   Brief Narrative: Christina Vincent is a 60 y.o. female with a history of T2DM, HTN, HLD, obesity and depression who presented with fever and dyspnea found to have positive SARS-CoV-2 PCR and mild hypoxia despite clear CXR on 12/27. CRP 16.6, PCT negative. Full scope treatment commenced with subsequent worsening of hypoxia. CTA chest 1/4 revealed worsening airspace disease compatible with progressive covid pneumonia as well as acute bilateral pulmonary emboli without right heart strain. Severe hypoxemia continues. CXR repeated 1/13 confirms extensive but stable infiltrates bilaterally.  Assessment & Plan: Principal Problem:   2019 novel coronavirus disease (COVID-19) Active Problems:   Hypertension   Type 2 diabetes mellitus (HCC)   Dyslipidemia   Depression   Class 1 obesity   Acute hypoxemic respiratory failure due to COVID-19 [facility])  Acute hypoxemic respiratory failure due to covid-19 pneumonia and PE: SARS-CoV-2 positive on 02/04/2020.  - Remains significantly hypoxemic with unchanged significant diffuse infiltrates on CXR at last check. Will continue efforts at therapy, OOB, IS, FV, and proning.  - Currently recommend HFNC alone if able to maintain oxygen saturations with NRB as needed for additional support and/or assistance with panic/anxiety. - Completed remdesivir - Completed steroid with taper and baricitinib x14 days (02/19/2020) - Continue airborne, contact precautions, can lift isolation 1/17.  Segmental PE: No right heart strain. - Continue lovenox 1mg /kg q12h.   T2DM with steroid-induced hyperglycemia: HbA1c 7.7%.  - CBG control significantly improved, will stop levemir and mealtime insulin. Continue SSI. - Continue linagliptin  HTN:  - Continue losartan  LFT elevation: Mild and stable. Continue intermittent monitoring.  Depression, insomnia:  - Continue remeron,  trazodone, xanax  Anxiety: Exacerbated by hospitalization, particularly at night.  - Continue xanax 0.5mg  prn (no sedation noted) - Also having evidence of panic attacks, overall not diagnostic of panic disorder as there is an identifiable and appropriate trigger. Frequent reassurance to be provided. Hopefully family presence after completion of isolation will be helpful.  Mouth pain: Resolved.  - prn magic mouthwash.   Stress > urge urinary incontinence:  - Pt declines initiation of medication, will continue lifestyle modifications.  Obesity: Estimated body mass index is 33.15 kg/m as calculated from the following:   Height as of this encounter: 5\' 8"  (1.727 m).   Weight as of this encounter: 98.9 kg.  DVT prophylaxis: Lovenox Code Status: Full Family Communication: Husband by phone daily Disposition Plan:  Status is: Inpatient  Remains inpatient appropriate because:Inpatient level of care appropriate due to severity of illness  Dispo:  Patient From: Home  Planned Disposition: Home  Expected discharge date: >3 days  Medically stable for discharge: No  Consultants:   None  Procedures:   None  Antimicrobials:  Remdesivir   Subjective: "I feel better." Her shortness of breath at rest is mild-moderate but severely and abruptly worsens at the thought of getting up and oxygen saturation also worsens when getting OOB at all. Nonetheless she did participate with therapy to get into bedside chair yesterday and will continue to do so and continue using IS. She does not want any changes to xanax.  Objective: Vitals:   02/21/20 0523 02/21/20 1334 02/21/20 2156 02/22/20 0604  BP: (!) 106/57 (!) 155/130 96/60 (!) 100/54  Pulse: 97 (!) 153 87 (!) 109  Resp: 20 20 20    Temp: 99.3 F (37.4 C) 97.8 F (36.6 C) 97.7 F (36.5 C) 98.9 F (37.2  C)  TempSrc: Oral Axillary Oral Oral  SpO2: (!) 89% (!) 85% 95% 95%  Weight:      Height:       No intake or output data in the 24  hours ending 02/22/20 1125 Filed Weights   02/04/20 1838  Weight: 98.9 kg   Gen: 60 y.o. female in no distress Pulm: Tachypneic, mildly labored with longer sentences at rest. Crackles stable. Diminished at bases. CV: Regular rate and rhythm. No murmur, rub, or gallop. No JVD, no dependent edema. GI: Abdomen soft, non-tender, non-distended, with normoactive bowel sounds.  Ext: Warm, no deformities Skin: No rashes, lesions or ulcers on visualized skin. Neuro: Alert and oriented. No focal neurological deficits. Psych: Judgement and insight appear intact. Mood anxious & affect congruent and very pleasant. Behavior is appropriate.    Data Reviewed: I have personally reviewed following labs and imaging studies  CBC: Recent Labs  Lab 02/19/20 0458 02/22/20 0459  WBC 17.2* 14.5*  HGB 13.0 11.6*  HCT 40.9 35.9*  MCV 90.7 89.3  PLT 314 256   Basic Metabolic Panel: Recent Labs  Lab 02/18/20 0515 02/20/20 0509 02/22/20 0459  NA 137 136 135  K 4.7 4.5 4.4  CL 97* 95* 92*  CO2 29 29 31   GLUCOSE 126* 116* 109*  BUN 16 14 14   CREATININE 0.59 0.62 0.57  CALCIUM 8.7* 8.5* 8.6*   GFR: Estimated Creatinine Clearance: 95.4 mL/min (by C-G formula based on SCr of 0.57 mg/dL). Liver Function Tests: Recent Labs  Lab 02/18/20 0515 02/20/20 0509 02/22/20 0459  AST 33 42* 38  ALT 51* 86* 81*  ALKPHOS 91 93 104  BILITOT 1.1 1.0 0.6  PROT 7.3 7.1 7.2  ALBUMIN 2.7* 2.5* 2.2*    Scheduled Meds: . albuterol  2 puff Inhalation Q6H  . vitamin C  500 mg Oral Daily  . benzonatate  200 mg Oral BID  . chlorpheniramine-HYDROcodone  5 mL Oral QHS  . enoxaparin (LOVENOX) injection  100 mg Subcutaneous BID  . insulin aspart  0-20 Units Subcutaneous TID WC  . mirtazapine  7.5 mg Oral QHS  . traZODone  50 mg Oral QHS  . zinc sulfate  220 mg Oral Daily   Continuous Infusions:   LOS: 17 days   Time spent: 35 minutes.  04/19/20, MD [facility] [email or web address] 02/22/2020, 11:25  AM

## 2020-02-22 NOTE — Progress Notes (Signed)
ANTICOAGULATION CONSULT NOTE - Follow Up Consult  Pharmacy Consult for Lovenox Indication: acute pulmonary embolus  No Known Allergies  Patient Measurements: Height: 5\' 8"  (172.7 cm) Weight: 98.9 kg (218 lb) IBW/kg (Calculated) : 63.9  Vital Signs: Temp: 98.9 F (37.2 C) (01/14 0604) Temp Source: Oral (01/14 0604) BP: 100/54 (01/14 0604) Pulse Rate: 109 (01/14 0604)  Labs: Recent Labs    02/20/20 0509 02/22/20 0459  HGB  --  11.6*  HCT  --  35.9*  PLT  --  256  CREATININE 0.62 0.57    Estimated Creatinine Clearance: 95.4 mL/min (by C-G formula based on SCr of 0.57 mg/dL).   Medical History: Past Medical History:  Diagnosis Date  . Depression   . Dyslipidemia     Assessment: 60 yo F admitted on 12/27 with COVID PNA.  She has been on Lovenox since admission for VTE px.  Dose was briefly increased to 1mg /kg q12h x2 doses on 12/30 & 31 due to spike in DDimer but reduced back to px dose when dopplers were negative for DVT.    Chest CTa 11/4  + bilateral PE without evidence of right heart strain.  Repeat dopplers remain negative for DVT.  Pharmacy consulted to change to full-dose Lovenox.  Today, 02/22/2020: - CBC stable - no bleeding documented - scr stable  Goal of Therapy:  Anti-Xa level 0.6-1 units/ml 4hrs after LMWH dose given Monitor platelets by anticoagulation protocol: Yes   Plan:  - Continue Lovenox 100mg  SQ q12h -  CBC at least q72h - Follow up plans for transition to oral [facility]  02/24/2020 D  02/22/2020 9:57 AM

## 2020-02-22 NOTE — Progress Notes (Signed)
Physical Therapy Treatment Patient Details Name: Christina Vincent MRN: [id number] DOB: [birth year] Today's Date: 02/22/2020    History of Present Illness 60 y.o. female with medical history significant of depression, dyslipidemia, type 2 diabetes, class I obesity, hypertension who is coming to the emergency department due to progressively worse shortness of breath for the past 6 days associated with fever, chills, fatigue, malaise, sore throat, occasionally productive of whitish/clear dry cough and rhinorrhea.  Patient is unvaccinated and has been admitted for acute hypoxemic respiratory failure secondary to COVID-19.  Pt also found to have PE 02/12/20    PT Comments    Pt premedicated for anxiety however still appears very anxious with mobilizing.  Pt's SpO2 99% at rest however, pt extremely fatigued and dyspneic after transfer bed to chair. SpO2 dropped to 65%.  Pt continues to take fast, shallow breaths so educated on proper diaphragmatic breathing.  Pt remains on 10L HFNC and 15L PRB.  (monitor on pt's toe today)   Follow Up Recommendations  Home health PT;Supervision for mobility/OOB     Equipment Recommendations  Other (comment) (rollator)    Recommendations for Other Services       Precautions / Restrictions Precautions Precautions: Fall Precaution Comments: monitor sats, anxious    Mobility  Bed Mobility Overal bed mobility: Needs Assistance Bed Mobility: Supine to Sit     Supine to sit: Min assist     General bed mobility comments: increased time and multiple attempts for pt to come fully upright (?anxious, breathing), min A for trunk support  Transfers Overall transfer level: Needs assistance Equipment used: None Transfers: Sit to/from [employer] Sit to Stand: Min assist Stand pivot transfers: Min guard       General transfer comment: pt with poor eccentric control and required assist to lower into recliner  Ambulation/Gait                  Stairs             Wheelchair Mobility    Modified Rankin (Stroke Patients Only)       Balance                                            Cognition Arousal/Alertness: Awake/alert Behavior During Therapy: Anxious Overall Cognitive Status: Within Functional Limits for tasks assessed                                 General Comments: patient becomes highly anxious with activity; have room ready prior to moving pt      Exercises      General Comments        Pertinent Vitals/Pain Pain Assessment: No/denies pain    Home Living                      Prior Function            PT Goals (current goals can now be found in the care plan section) Acute Rehab PT Goals PT Goal Formulation: With patient Time For Goal Achievement: 03/07/20 Potential to Achieve Goals: Fair Progress towards PT goals: Progressing toward goals    Frequency    Min 3X/week      PT Plan Current plan remains appropriate    Co-evaluation  AM-PAC PT "6 Clicks" Mobility   Outcome Measure  Help needed turning from your back to your side while in a flat bed without using bedrails?: A Little Help needed moving from lying on your back to sitting on the side of a flat bed without using bedrails?: A Little Help needed moving to and from a bed to a chair (including a wheelchair)?: A Little Help needed standing up from a chair using your arms (e.g., wheelchair or bedside chair)?: A Little Help needed to walk in hospital room?: A Lot Help needed climbing 3-5 steps with a railing? : A Lot 6 Click Score: 16    End of Session Equipment Utilized During Treatment: Oxygen Activity Tolerance: Patient limited by fatigue;Other (comment) (dyspnea, decreased sats) Patient left: in chair;with call bell/phone within reach Nurse Communication: Mobility status PT Visit Diagnosis: Other abnormalities of gait and mobility (R26.89)     Time:  3354-5625 PT Time Calculation (min) (ACUTE ONLY): 27 min  Charges:  $Therapeutic Activity: 23-37 mins                    Thomasene Mohair PT, DPT Acute Rehabilitation Services Pager: [phone] Office: [phone]  Maida Sale E 02/22/2020, 3:58 PM

## 2020-02-23 DIAGNOSIS — I2699 Other pulmonary embolism without acute cor pulmonale: Secondary | ICD-10-CM

## 2020-02-23 LAB — GLUCOSE, CAPILLARY
Glucose-Capillary: 126 mg/dL — ABNORMAL HIGH (ref 70–99)
Glucose-Capillary: 193 mg/dL — ABNORMAL HIGH (ref 70–99)
Glucose-Capillary: 144 mg/dL — ABNORMAL HIGH (ref 70–99)
Glucose-Capillary: 133 mg/dL — ABNORMAL HIGH (ref 70–99)

## 2020-02-23 NOTE — Plan of Care (Signed)
  Problem: Health Behavior/Discharge Planning: Goal: Ability to manage health-related needs will improve Outcome: Progressing   Problem: Clinical Measurements: Goal: Ability to maintain clinical measurements within normal limits will improve Outcome: Progressing Goal: Will remain free from infection Outcome: Progressing Goal: Diagnostic test results will improve Outcome: Progressing Goal: Respiratory complications will improve Outcome: Progressing Goal: Cardiovascular complication will be avoided Outcome: Progressing   Problem: Activity: Goal: Risk for activity intolerance will decrease Outcome: Progressing   Problem: Coping: Goal: Level of anxiety will decrease Outcome: Progressing   Problem: Elimination: Goal: Will not experience complications related to bowel motility Outcome: Progressing Goal: Will not experience complications related to urinary retention Outcome: Progressing   Problem: Pain Managment: Goal: General experience of comfort will improve Outcome: Progressing   Problem: Safety: Goal: Ability to remain free from injury will improve Outcome: Progressing   Problem: Skin Integrity: Goal: Risk for impaired skin integrity will decrease Outcome: Progressing   Problem: Education: Goal: Knowledge of risk factors and measures for prevention of condition will improve Outcome: Progressing   Problem: Coping: Goal: Psychosocial and spiritual needs will be supported Outcome: Progressing   Problem: Respiratory: Goal: Will maintain a patent airway Outcome: Progressing Goal: Complications related to the disease process, condition or treatment will be avoided or minimized Outcome: Progressing   

## 2020-02-23 NOTE — Progress Notes (Signed)
Occupational Therapy Treatment Patient Details Name: Christina Vincent MRN: [id number] DOB: Aug 03, 1960 Today's Date: 02/23/2020    History of present illness 60 y.o. female with medical history significant of depression, dyslipidemia, type 2 diabetes, class I obesity, hypertension who is coming to the emergency department due to progressively worse shortness of breath for the past 6 days associated with fever, chills, fatigue, malaise, sore throat, occasionally productive of whitish/clear dry cough and rhinorrhea.  Patient is unvaccinated and has been admitted for acute hypoxemic respiratory failure secondary to COVID-19.  Pt also found to have PE 02/12/20   OT comments  Patient found on 10 L HFNC and 15 L NRB. Patient severely limited by anxiety and fear. At times patient became tearful stating "I'm going home when I leave her." Patient heavily reliant on NRB and does not exhibit any improvement in being able to perform breathing techniques. Significant amount of time encouraging patient, educating patient about pursed lipped breathing and diaphragmatic breathing, instructing patient on breathing techniques and encouraging use of incentive spirometer. Patient's o2 sats maintained in high 90s throughout treatment. Would not remove NRB. Patient performed toileting with min guard and bed mobility with supervision. Patient's functional abilities similar to her evaluation. Hasn't been able to progress ambulation and/or reduce oxygen. She may need SNF or LTACH at discharge due to oxygen requirements and poor activity tolerance.    Follow Up Recommendations  SNF;LTACH    Equipment Recommendations  3 in 1 bedside commode;Tub/shower seat    Recommendations for Other Services      Precautions / Restrictions Precautions Precautions: Fall Precaution Comments: monitor sats, anxious Restrictions Weight Bearing Restrictions: No       Mobility Bed Mobility   Bed Mobility: Supine to Sit;Sit to Supine      Supine to sit: Min guard Sit to supine: Min guard   General bed mobility comments: Significant amount of time needed for patient to actually perform movement in order to transfer to [facility] but no actually physical assistance needed. After toileting patient basically threw herself back in the bed and curled up on her side.  Transfers   Equipment used: None Transfers: Sit to/from [employer] to Stand: Min guard   Squat pivot transfers: Min guard     General transfer comment: Patient performed quick squat pivots to [facility]. No physica lassistance needed. Patient's movements very quick and potentially unsafe.    Balance Overall balance assessment: Mild deficits observed, not formally tested                                         ADL either performed or assessed with clinical judgement   ADL                           Toilet Transfer: Min Dealer Details (indicate cue type and reason): Patient pivoted herself to [facility] on 10 L HFNC and 15 LNRB. Toileting- Clothing Manipulation and Hygiene: Supervision/safety;Sit to/from stand;Min guard Toileting - Clothing Manipulation Details (indicate cue type and reason): Patient performed independently with therapist providing min guard.     Functional mobility during ADLs: Min guard       Vision Patient Visual Report: No change from baseline     Perception     Praxis      Cognition Arousal/Alertness: Awake/alert Behavior During Therapy: Anxious Overall Cognitive Status:  Within Functional Limits for tasks assessed                                          Exercises     Shoulder Instructions       General Comments      Pertinent Vitals/ Pain       Pain Assessment: No/denies pain  Home Living                                          Prior Functioning/Environment              Frequency  Min 2X/week         Progress Toward Goals  OT Goals(current goals can now be found in the care plan section)  Progress towards OT goals: Progressing toward goals  Acute Rehab OT Goals Patient Stated Goal: to be independent again OT Goal Formulation: With patient Time For Goal Achievement: 02/28/20 Potential to Achieve Goals: Fair  Plan Discharge plan needs to be updated    Co-evaluation                 AM-PAC OT "6 Clicks" Daily Activity     Outcome Measure   Help from another person eating meals?: None Help from another person taking care of personal grooming?: None Help from another person toileting, which includes using toliet, bedpan, or urinal?: A Little Help from another person bathing (including washing, rinsing, drying)?: A Little Help from another person to put on and taking off regular upper body clothing?: A Little Help from another person to put on and taking off regular lower body clothing?: A Little 6 Click Score: 20    End of Session Equipment Utilized During Treatment: Oxygen  OT Visit Diagnosis: Other (comment)   Activity Tolerance Other (comment) (limited by anxiety)   Patient Left in bed;with call bell/phone within reach   Nurse Communication Mobility status (Precarious IV)        Time: 7482-7078 OT Time Calculation (min): 25 min  Charges: OT General Charges $OT Visit: 1 Visit OT Treatments $Self Care/Home Management : 23-37 mins $Therapeutic Activity: 8-22 mins  Georgette Helmer, OTR/L Acute Care Rehab Services  Office [phone] Pager: [phone]    [name] 02/23/2020, 2:46 PM

## 2020-02-23 NOTE — Progress Notes (Signed)
PROGRESS NOTE    Christina Vincent    Code Status: Full Code  DZH:[id number] DOB: [birth year] DOA: 02/04/2020 LOS: 18 days  PCP: Avon Gully, MD CC:  Chief Complaint  Patient presents with  . Shortness of Breath       Hospital Summary   This is a 60 year old female with past medical history of type 2 diabetes, hypertension, hyperlipidemia, obesity, depression who presented to the ED on 12/27 with fever, dyspnea and found to be COVID-19 positive with mild hypoxia.  CRP 16.6 and PCT negative on presentation.  Full scope of treatment commenced but subsequently had worsening hypoxia.  CTA chest 1/4 resealed airspace disease compatible progressive COVID-pneumonia as well as acute bilateral PE without right heart strain and started on full dose Lovenox.  She has had a prolonged hospital stay due to her O2 requirements    A & P   Principal Problem:   2019 novel coronavirus disease (COVID-19) Active Problems:   Hypertension   Type 2 diabetes mellitus (HCC)   Dyslipidemia   Depression   Class 1 obesity   Acute hypoxemic respiratory failure due to COVID-19 (HCC)   1. Acute hypoxic respiratory failure secondary to COVID-19 and acute bilateral segmental PE without right heart strain a. Continues to remain hypoxic but with slowly improved O2 requirements b. Completed treatment with steroid with taper, baricitinib x14 days and remdesivir c. Can lift airborne and contact precautions on 1/17 d. Continue full dose Lovenox  2. Type 2 diabetes with steroid-induced hyperglycemia a. Continue sliding scale and linagliptin  3. Hypertension a. Continue losartan  4. Anxiety related to healthcare a. Continue Xanax 0.5 mg 3 times daily as needed and nightly as needed  5. Depression, insomnia a. Continue Remeron, trazodone and Xanax  6. LFT elevation a. Follow-up in a.m.  7. Stress > urge incontinence a. Continue lifestyle modification as patient has declined medications   DVT prophylaxis:  Lovenox   Family Communication: Patient's husband has been updated   Disposition Plan:  Status is: Inpatient  Remains inpatient appropriate because:Unsafe d/c plan   Dispo:  Patient From: Home  Planned Disposition: Home  Expected discharge date: 02/26/2020  Medically stable for discharge: No            Pressure injury documentation     none  Consultants  None  Procedures  None  Antibiotics   Anti-infectives (From admission, onward)   Start     Dose/Rate Route Frequency Ordered Stop   02/05/20 1600  remdesivir 100 mg in sodium chloride 0.9 % 100 mL IVPB        100 mg 200 mL/hr over 30 Minutes Intravenous Daily 02/04/20 2245 02/08/20 1055   02/04/20 2300  remdesivir 100 mg in sodium chloride 0.9 % 100 mL IVPB        100 mg 200 mL/hr over 30 Minutes Intravenous Every 30 min 02/04/20 2245 02/05/20 0129        Subjective   Patient states that she continues to have issues with her anxiety and states that her Xanax does help for about 5 hours.  Feels as though she is getting her anxiety meds on time and this is making her symptoms worse.  Has been doing PT in the bed regularly.  Otherwise no chest pain or other complaints  Objective   Vitals:   02/23/20 0413 02/23/20 0906 02/23/20 1122 02/23/20 1440  BP: (!) 117/93  97/60   Pulse: 89  (!) 109   Resp: 20  18 (!)  26  Temp: 97.7 F (36.5 C)  98.2 F (36.8 C)   TempSrc: Oral  Oral   SpO2: 90% 92% 97% 90%  Weight:      Height:        Intake/Output Summary (Last 24 hours) at 02/23/2020 1744 Last data filed at 02/23/2020 1700 Gross per 24 hour  Intake --  Output 1252 ml  Net -1252 ml   Filed Weights   02/04/20 1838  Weight: 98.9 kg    Examination:  Physical Exam Vitals and nursing note reviewed.  Constitutional:      Appearance: Normal appearance.  HENT:     Head: Normocephalic and atraumatic.  Eyes:     Conjunctiva/sclera: Conjunctivae normal.  Cardiovascular:     Rate and Rhythm: Regular  rhythm. Tachycardia present.  Pulmonary:     Effort: Pulmonary effort is normal.     Breath sounds: Normal breath sounds.  Abdominal:     General: Abdomen is flat.     Palpations: Abdomen is soft.  Musculoskeletal:        General: No swelling or tenderness.  Skin:    Coloration: Skin is not jaundiced or pale.  Neurological:     Mental Status: She is alert. Mental status is at baseline.  Psychiatric:        Mood and Affect: Mood normal.        Behavior: Behavior normal.     Data Reviewed: I have personally reviewed following labs and imaging studies  CBC: Recent Labs  Lab 02/19/20 0458 02/22/20 0459  WBC 17.2* 14.5*  HGB 13.0 11.6*  HCT 40.9 35.9*  MCV 90.7 89.3  PLT 314 256   Basic Metabolic Panel: Recent Labs  Lab 02/18/20 0515 02/20/20 0509 02/22/20 0459  NA 137 136 135  K 4.7 4.5 4.4  CL 97* 95* 92*  CO2 29 29 31   GLUCOSE 126* 116* 109*  BUN 16 14 14   CREATININE 0.59 0.62 0.57  CALCIUM 8.7* 8.5* 8.6*   GFR: Estimated Creatinine Clearance: 95.4 mL/min (by C-G formula based on SCr of 0.57 mg/dL). Liver Function Tests: Recent Labs  Lab 02/18/20 0515 02/20/20 0509 02/22/20 0459  AST 33 42* 38  ALT 51* 86* 81*  ALKPHOS 91 93 104  BILITOT 1.1 1.0 0.6  PROT 7.3 7.1 7.2  ALBUMIN 2.7* 2.5* 2.2*   No results for input(s): LIPASE, AMYLASE in the last 168 hours. No results for input(s): AMMONIA in the last 168 hours. Coagulation Profile: No results for input(s): INR, PROTIME in the last 168 hours. Cardiac Enzymes: No results for input(s): CKTOTAL, CKMB, CKMBINDEX, TROPONINI in the last 168 hours. BNP (last 3 results) No results for input(s): PROBNP in the last 8760 hours. HbA1C: No results for input(s): HGBA1C in the last 72 hours. CBG: Recent Labs  Lab 02/22/20 1621 02/22/20 2059 02/23/20 0732 02/23/20 1118 02/23/20 1643  GLUCAP 160* 185* 126* 193* 144*   Lipid Profile: No results for input(s): CHOL, HDL, LDLCALC, TRIG, CHOLHDL, LDLDIRECT in  the last 72 hours. Thyroid Function Tests: No results for input(s): TSH, T4TOTAL, FREET4, T3FREE, THYROIDAB in the last 72 hours. Anemia Panel: No results for input(s): VITAMINB12, FOLATE, FERRITIN, TIBC, IRON, RETICCTPCT in the last 72 hours. Sepsis Labs: No results for input(s): PROCALCITON, LATICACIDVEN in the last 168 hours.  No results found for this or any previous visit (from the past 240 hour(s)).       Radiology Studies: No results found.      Scheduled Meds: . albuterol  2 puff Inhalation Q6H  . vitamin C  500 mg Oral Daily  . benzonatate  200 mg Oral BID  . chlorpheniramine-HYDROcodone  5 mL Oral QHS  . enoxaparin (LOVENOX) injection  100 mg Subcutaneous BID  . insulin aspart  0-20 Units Subcutaneous TID WC  . mirtazapine  7.5 mg Oral QHS  . traZODone  50 mg Oral QHS  . zinc sulfate  220 mg Oral Daily   Continuous Infusions:   Time spent: 28 minutes with over 50% of the time coordinating the patient's care    Jae Dire, DO [facility]   Call night coverage person covering after 7pm

## 2020-02-23 NOTE — Progress Notes (Signed)
Added sterile water to 02 system- uneventful. Placed PT on pulse ox via telemetry box- reported to RN that leads from tele box are not connected.

## 2020-02-24 LAB — GLUCOSE, CAPILLARY
Glucose-Capillary: 164 mg/dL — ABNORMAL HIGH (ref 70–99)
Glucose-Capillary: 171 mg/dL — ABNORMAL HIGH (ref 70–99)
Glucose-Capillary: 121 mg/dL — ABNORMAL HIGH (ref 70–99)
Glucose-Capillary: 145 mg/dL — ABNORMAL HIGH (ref 70–99)

## 2020-02-24 NOTE — Progress Notes (Signed)
PROGRESS NOTE    Christina Vincent    Code Status: Full Code  YTK:[id number] DOB: 1960-02-27 DOA: 02/04/2020 LOS: 19 days  PCP: Avon Gully, MD CC:  Chief Complaint  Patient presents with  . Shortness of Breath       Hospital Summary   This is a 60 year old female with past medical history of type 2 diabetes, hypertension, hyperlipidemia, obesity, depression who presented to the ED on 12/27 with fever, dyspnea and found to be COVID-19 positive with mild hypoxia.  CRP 16.6 and PCT negative on presentation.  Full scope of treatment commenced but subsequently had worsening hypoxia.  CTA chest 1/4 resealed airspace disease compatible progressive COVID-pneumonia as well as acute bilateral PE without right heart strain and started on full dose Lovenox.  She has had a prolonged hospital stay due to her O2 requirements    A & P   Principal Problem:   2019 novel coronavirus disease (COVID-19) Active Problems:   Hypertension   Type 2 diabetes mellitus (HCC)   Dyslipidemia   Depression   Class 1 obesity   Acute hypoxemic respiratory failure due to COVID-19 [facility])   Pulmonary emboli (HCC)   1. Acute hypoxic respiratory failure secondary to COVID-19 and acute bilateral segmental PE without right heart strain a. Continues to remain hypoxic but with slowly improved O2 requirements but very anxious about titrating O2 b. Completed treatment with steroid with taper, baricitinib x14 days and remdesivir c. Can lift airborne and contact precautions on 1/17 d. Continue full dose Lovenox e. Encourage OOB, Incentive spirometer  2. Type 2 diabetes with steroid-induced hyperglycemia a. Continue sliding scale and linagliptin  3. Hypertension a. Continue losartan  4. Anxiety related to healthcare a. Continue Xanax 0.5 mg 3 times daily as needed and nightly as needed b. She is very anxious taking off NRB or with any attempts to wean O2. We had a good discussion today and she is willing to take her  antianxiety medications 30 mins-1hr prior to attempting to take off NRB, wean O2 or ambulate in order to help the patient progress   5. Depression, insomnia a. Continue Remeron, trazodone and Xanax  6. LFT elevation, improved  7. Stress > urge incontinence a. Continue lifestyle modification as patient has declined medications   DVT prophylaxis: Lovenox   Family Communication: Patient's husband has been updated today  Disposition Plan:  Status is: Inpatient  Remains inpatient appropriate because:Unsafe d/c plan   Dispo:  Patient From: Home  Planned Disposition: Home  Expected discharge date: 02/26/2020  Medically stable for discharge: No            Pressure injury documentation     none  Consultants  None  Procedures  None  Antibiotics   Anti-infectives (From admission, onward)   Start     Dose/Rate Route Frequency Ordered Stop   02/05/20 1600  remdesivir 100 mg in sodium chloride 0.9 % 100 mL IVPB        100 mg 200 mL/hr over 30 Minutes Intravenous Daily 02/04/20 2245 02/08/20 1055   02/04/20 2300  remdesivir 100 mg in sodium chloride 0.9 % 100 mL IVPB        100 mg 200 mL/hr over 30 Minutes Intravenous Every 30 min 02/04/20 2245 02/05/20 0129        Subjective   Nursing reports that the patient gets very anxious when trying to wean O2. O2 sat remaining in 90s during this time. At bedside with bedside nurse present we  had a discussion regarding the patient's fears and encourage minimal daily improvements to help her progress. She was tearful but agreed to try getting into the chair and OOB. She requested that she get her xanax prior to trying to get out of bed which we agreed upon. She denies any other complaints.   Objective   Vitals:   02/24/20 0800 02/24/20 0830 02/24/20 1219 02/24/20 1518  BP:   112/69   Pulse:   (!) 109   Resp:   18   Temp:   98.4 F (36.9 C)   TempSrc:   Oral   SpO2: 94% 100% 93% 96%  Weight:      Height:         Intake/Output Summary (Last 24 hours) at 02/24/2020 1617 Last data filed at 02/23/2020 1700 Gross per 24 hour  Intake -  Output 850 ml  Net -850 ml   Filed Weights   02/04/20 1838  Weight: 98.9 kg    Examination:  Physical Exam Vitals and nursing note reviewed.  Constitutional:      Appearance: Normal appearance.  HENT:     Head: Normocephalic and atraumatic.  Eyes:     Conjunctiva/sclera: Conjunctivae normal.  Cardiovascular:     Rate and Rhythm: Normal rate and regular rhythm.  Pulmonary:     Effort: Pulmonary effort is normal.     Breath sounds: Normal breath sounds.  Abdominal:     General: Abdomen is flat.     Palpations: Abdomen is soft.  Musculoskeletal:        General: No swelling or tenderness.  Skin:    Coloration: Skin is not jaundiced or pale.  Neurological:     Mental Status: She is alert. Mental status is at baseline.  Psychiatric:        Mood and Affect: Mood is anxious. Affect is tearful.     Data Reviewed: I have personally reviewed following labs and imaging studies  CBC: Recent Labs  Lab 02/19/20 0458 02/22/20 0459  WBC 17.2* 14.5*  HGB 13.0 11.6*  HCT 40.9 35.9*  MCV 90.7 89.3  PLT 314 256   Basic Metabolic Panel: Recent Labs  Lab 02/18/20 0515 02/20/20 0509 02/22/20 0459  NA 137 136 135  K 4.7 4.5 4.4  CL 97* 95* 92*  CO2 29 29 31   GLUCOSE 126* 116* 109*  BUN 16 14 14   CREATININE 0.59 0.62 0.57  CALCIUM 8.7* 8.5* 8.6*   GFR: Estimated Creatinine Clearance: 95.4 mL/min (by C-G formula based on SCr of 0.57 mg/dL). Liver Function Tests: Recent Labs  Lab 02/18/20 0515 02/20/20 0509 02/22/20 0459  AST 33 42* 38  ALT 51* 86* 81*  ALKPHOS 91 93 104  BILITOT 1.1 1.0 0.6  PROT 7.3 7.1 7.2  ALBUMIN 2.7* 2.5* 2.2*   No results for input(s): LIPASE, AMYLASE in the last 168 hours. No results for input(s): AMMONIA in the last 168 hours. Coagulation Profile: No results for input(s): INR, PROTIME in the last 168  hours. Cardiac Enzymes: No results for input(s): CKTOTAL, CKMB, CKMBINDEX, TROPONINI in the last 168 hours. BNP (last 3 results) No results for input(s): PROBNP in the last 8760 hours. HbA1C: No results for input(s): HGBA1C in the last 72 hours. CBG: Recent Labs  Lab 02/23/20 1118 02/23/20 1643 02/23/20 2055 02/24/20 0817 02/24/20 1126  GLUCAP 193* 144* 133* 164* 171*   Lipid Profile: No results for input(s): CHOL, HDL, LDLCALC, TRIG, CHOLHDL, LDLDIRECT in the last 72 hours. Thyroid Function Tests:  No results for input(s): TSH, T4TOTAL, FREET4, T3FREE, THYROIDAB in the last 72 hours. Anemia Panel: No results for input(s): VITAMINB12, FOLATE, FERRITIN, TIBC, IRON, RETICCTPCT in the last 72 hours. Sepsis Labs: No results for input(s): PROCALCITON, LATICACIDVEN in the last 168 hours.  No results found for this or any previous visit (from the past 240 hour(s)).       Radiology Studies: No results found.      Scheduled Meds: . albuterol  2 puff Inhalation Q6H  . vitamin C  500 mg Oral Daily  . benzonatate  200 mg Oral BID  . chlorpheniramine-HYDROcodone  5 mL Oral QHS  . enoxaparin (LOVENOX) injection  100 mg Subcutaneous BID  . insulin aspart  0-20 Units Subcutaneous TID WC  . mirtazapine  7.5 mg Oral QHS  . traZODone  50 mg Oral QHS  . zinc sulfate  220 mg Oral Daily   Continuous Infusions:   Time spent: with over 50% of the time coordinating the patient's care    Jae Dire, DO [facility]   Call night coverage person covering after 7pm

## 2020-02-24 NOTE — Progress Notes (Signed)
Spoke to patient regarding use of oxygen this morning. Patient's O2 level at 100% with 10L HFNC and NRB. RN educated patient regarding the use of the NRB. RN attempted to have patient take off NRB. Patient started having a panic attack and put her mask back on. Will attempt again later this shift.

## 2020-02-25 LAB — CBC
HCT: 36.3 % (ref 36.0–46.0)
Hemoglobin: 11.2 g/dL — ABNORMAL LOW (ref 12.0–15.0)
MCH: 28.6 pg (ref 26.0–34.0)
MCHC: 30.9 g/dL (ref 30.0–36.0)
MCV: 92.6 fL (ref 80.0–100.0)
Platelets: 249 10*3/uL (ref 150–400)
RBC: 3.92 MIL/uL (ref 3.87–5.11)
RDW: 13.9 % (ref 11.5–15.5)
WBC: 12.7 10*3/uL — ABNORMAL HIGH (ref 4.0–10.5)
nRBC: 0 % (ref 0.0–0.2)

## 2020-02-25 LAB — GLUCOSE, CAPILLARY
Glucose-Capillary: 140 mg/dL — ABNORMAL HIGH (ref 70–99)
Glucose-Capillary: 135 mg/dL — ABNORMAL HIGH (ref 70–99)
Glucose-Capillary: 198 mg/dL — ABNORMAL HIGH (ref 70–99)
Glucose-Capillary: 126 mg/dL — ABNORMAL HIGH (ref 70–99)

## 2020-02-25 LAB — COMPREHENSIVE METABOLIC PANEL
ALT: 60 U/L — ABNORMAL HIGH (ref 0–44)
AST: 32 U/L (ref 15–41)
Albumin: 2.5 g/dL — ABNORMAL LOW (ref 3.5–5.0)
Alkaline Phosphatase: 104 U/L (ref 38–126)
Anion gap: 14 (ref 5–15)
BUN: 15 mg/dL (ref 6–20)
CO2: 30 mmol/L (ref 22–32)
Calcium: 8.9 mg/dL (ref 8.9–10.3)
Chloride: 93 mmol/L — ABNORMAL LOW (ref 98–111)
Creatinine, Ser: 0.55 mg/dL (ref 0.44–1.00)
GFR, Estimated: >60 mL/min (ref >60)
Glucose, Bld: 136 mg/dL — ABNORMAL HIGH (ref 70–99)
Potassium: 4.5 mmol/L (ref 3.5–5.1)
Sodium: 137 mmol/L (ref 135–145)
Total Bilirubin: 0.7 mg/dL (ref 0.3–1.2)
Total Protein: 7.6 g/dL (ref 6.5–8.1)

## 2020-02-25 NOTE — Plan of Care (Signed)
  Problem: Health Behavior/Discharge Planning: Goal: Ability to manage health-related needs will improve Outcome: Progressing   Problem: Clinical Measurements: Goal: Ability to maintain clinical measurements within normal limits will improve Outcome: Progressing Goal: Will remain free from infection Outcome: Progressing Goal: Diagnostic test results will improve Outcome: Progressing Goal: Respiratory complications will improve Outcome: Progressing Goal: Cardiovascular complication will be avoided Outcome: Progressing   Problem: Activity: Goal: Risk for activity intolerance will decrease Outcome: Progressing   Problem: Coping: Goal: Level of anxiety will decrease Outcome: Progressing   Problem: Elimination: Goal: Will not experience complications related to bowel motility Outcome: Progressing Goal: Will not experience complications related to urinary retention Outcome: Progressing   Problem: Pain Managment: Goal: General experience of comfort will improve Outcome: Progressing   Problem: Safety: Goal: Ability to remain free from injury will improve Outcome: Progressing   Problem: Skin Integrity: Goal: Risk for impaired skin integrity will decrease Outcome: Progressing   Problem: Education: Goal: Knowledge of risk factors and measures for prevention of condition will improve Outcome: Progressing   Problem: Coping: Goal: Psychosocial and spiritual needs will be supported Outcome: Progressing   Problem: Respiratory: Goal: Will maintain a patent airway Outcome: Progressing Goal: Complications related to the disease process, condition or treatment will be avoided or minimized Outcome: Progressing   

## 2020-02-25 NOTE — Progress Notes (Signed)
ANTICOAGULATION CONSULT NOTE - Follow Up Consult  Pharmacy Consult for Lovenox Indication: acute pulmonary embolus  No Known Allergies  Patient Measurements: Height: 5\' 8"  (172.7 cm) Weight: 98.9 kg (218 lb) IBW/kg (Calculated) : 63.9  Vital Signs: Temp: 98.4 F (36.9 C) (01/16 2152) Temp Source: Oral (01/16 2152) BP: 108/78 (01/16 2152) Pulse Rate: 100 (01/16 2152)  Labs: Recent Labs    02/25/20 0438  HGB 11.2*  HCT 36.3  PLT 249  CREATININE 0.55    Estimated Creatinine Clearance: 95.4 mL/min (by C-G formula based on SCr of 0.55 mg/dL).   Medical History: Past Medical History:  Diagnosis Date  . Depression   . Dyslipidemia     Assessment: 60 yo F admitted on 12/27 with COVID PNA.  She has been on Lovenox since admission for VTE px.  Dose was briefly increased to 1mg /kg q12h x2 doses on 12/30 & 31 due to spike in DDimer but reduced back to px dose when dopplers were negative for DVT.   Chest CTa 11/4  + bilateral PE without evidence of right heart strain.  Repeat dopplers remain negative for DVT.  Pharmacy consulted to change to full-dose Lovenox.  Today, 02/25/2020: - CBC stable; Hgb down slightly to 11.2, Plt wnl - no bleeding documented - scr stable with CrCl > 30 ml/min.  Goal of Therapy:  Anti-Xa level 0.6-1 units/ml 4hrs after LMWH dose given Monitor platelets by anticoagulation protocol: Yes   Plan:  - Continue Lovenox 100mg  SQ q12h -  CBC at least q72h - Follow up plans for transition to oral [facility] will sign off note writing for LMWH consult, but will continue to follow labs and long-term plans peripherally.  Please re-consult with any new needs.  02/27/2020 PharmD, BCPS Clinical Pharmacist [facility] main pharmacy [phone] 02/25/2020 7:49 AM

## 2020-02-25 NOTE — Progress Notes (Signed)
PROGRESS NOTE    Christina Vincent    Code Status: Full Code  YKZ:[id number] DOB: [birth year] DOA: 02/04/2020 LOS: 20 days  PCP: Avon Gully, MD CC:  Chief Complaint  Patient presents with  . Shortness of Breath       Hospital Summary   This is a 60 year old female with past medical history of type 2 diabetes, hypertension, hyperlipidemia, obesity, depression who presented to the ED on 12/27 with fever, dyspnea and found to be COVID-19 positive with mild hypoxia.  CRP 16.6 and PCT negative on presentation.  Full scope of treatment commenced but subsequently had worsening hypoxia.  CTA chest 1/4 resealed airspace disease compatible progressive COVID-pneumonia as well as acute bilateral PE without right heart strain and started on full dose Lovenox.  She has had a prolonged hospital stay due to her O2 requirements and anxiety.    A & P   Principal Problem:   2019 novel coronavirus disease (COVID-19) Active Problems:   Hypertension   Type 2 diabetes mellitus (HCC)   Dyslipidemia   Depression   Class 1 obesity   Acute hypoxemic respiratory failure due to COVID-19 [facility])   Pulmonary emboli (HCC)   1. Acute hypoxic respiratory failure secondary to COVID-19 and acute bilateral segmental PE without right heart strain a. Continues to remain hypoxic but with slowly improved O2 requirements likely exacerbated by anxiety  b. Completed treatment with steroid with taper, baricitinib x14 days and remdesivir c. Can lift airborne and contact precautions on 1/17 d. Continue full dose Lovenox e. Titrate O2 as able f. Encourage OOB, Incentive spirometer  2. Type 2 diabetes with steroid-induced hyperglycemia a. Continue sliding scale and linagliptin  3. Hypertension a. Continue losartan  4. Anxiety related to healthcare a. Continue Xanax 0.5 mg 3 times daily as needed and nightly as needed b. Reassurance given at bedside  5. Depression, insomnia a. Continue Remeron, trazodone and  Xanax  6. LFT elevation, improved  7. Stress > urge incontinence a. Continue lifestyle modification as patient has declined medications   DVT prophylaxis: Lovenox   Family Communication: Patient's husband has been updated yesterday  Disposition Plan:  Status is: Inpatient  Remains inpatient appropriate because:Unsafe d/c plan   Dispo:  Patient From: Home  Planned Disposition: To be determined  Expected discharge date: 02/29/2020  Medically stable for discharge: No            Pressure injury documentation     none  Consultants  None  Procedures  None  Antibiotics   Anti-infectives (From admission, onward)   Start     Dose/Rate Route Frequency Ordered Stop   02/05/20 1600  remdesivir 100 mg in sodium chloride 0.9 % 100 mL IVPB        100 mg 200 mL/hr over 30 Minutes Intravenous Daily 02/04/20 2245 02/08/20 1055   02/04/20 2300  remdesivir 100 mg in sodium chloride 0.9 % 100 mL IVPB        100 mg 200 mL/hr over 30 Minutes Intravenous Every 30 min 02/04/20 2245 02/05/20 0129        Subjective   No new complaints, no overnight events. Doing well and tolerating her anxiety meds.   Objective   Vitals:   02/24/20 1219 02/24/20 1518 02/24/20 2152 02/25/20 1436  BP: 112/69  108/78 119/62  Pulse: (!) 109  100 (!) 108  Resp: 18  (!) 22 20  Temp: 98.4 F (36.9 C)  98.4 F (36.9 C) (!) 97.5 F (36.4 C)  TempSrc: Oral  Oral Axillary  SpO2: 93% 96% 91% 95%  Weight:      Height:       No intake or output data in the 24 hours ending 02/25/20 1910 Filed Weights   02/04/20 1838  Weight: 98.9 kg    Examination:  Physical Exam Vitals and nursing note reviewed.  Constitutional:      Appearance: Normal appearance.  HENT:     Head: Normocephalic and atraumatic.  Eyes:     Conjunctiva/sclera: Conjunctivae normal.  Cardiovascular:     Rate and Rhythm: Normal rate.     Pulses: Normal pulses.  Pulmonary:     Effort: Pulmonary effort is normal. No  tachypnea.  Abdominal:     General: Abdomen is flat.     Palpations: Abdomen is soft.  Musculoskeletal:        General: No swelling or tenderness.  Skin:    Coloration: Skin is not jaundiced or pale.  Neurological:     General: No focal deficit present.     Mental Status: She is alert. Mental status is at baseline.  Psychiatric:        Mood and Affect: Mood is anxious.        Behavior: Behavior normal.     Data Reviewed: I have personally reviewed following labs and imaging studies  CBC: Recent Labs  Lab 02/19/20 0458 02/22/20 0459 02/25/20 0438  WBC 17.2* 14.5* 12.7*  HGB 13.0 11.6* 11.2*  HCT 40.9 35.9* 36.3  MCV 90.7 89.3 92.6  PLT 314 256 249   Basic Metabolic Panel: Recent Labs  Lab 02/20/20 0509 02/22/20 0459 02/25/20 0438  NA 136 135 137  K 4.5 4.4 4.5  CL 95* 92* 93*  CO2 29 31 30   GLUCOSE 116* 109* 136*  BUN 14 14 15   CREATININE 0.62 0.57 0.55  CALCIUM 8.5* 8.6* 8.9   GFR: Estimated Creatinine Clearance: 95.4 mL/min (by C-G formula based on SCr of 0.55 mg/dL). Liver Function Tests: Recent Labs  Lab 02/20/20 0509 02/22/20 0459 02/25/20 0438  AST 42* 38 32  ALT 86* 81* 60*  ALKPHOS 93 104 104  BILITOT 1.0 0.6 0.7  PROT 7.1 7.2 7.6  ALBUMIN 2.5* 2.2* 2.5*   No results for input(s): LIPASE, AMYLASE in the last 168 hours. No results for input(s): AMMONIA in the last 168 hours. Coagulation Profile: No results for input(s): INR, PROTIME in the last 168 hours. Cardiac Enzymes: No results for input(s): CKTOTAL, CKMB, CKMBINDEX, TROPONINI in the last 168 hours. BNP (last 3 results) No results for input(s): PROBNP in the last 8760 hours. HbA1C: No results for input(s): HGBA1C in the last 72 hours. CBG: Recent Labs  Lab 02/24/20 1621 02/24/20 2146 02/25/20 0738 02/25/20 1129 02/25/20 1652  GLUCAP 121* 145* 140* 135* 198*   Lipid Profile: No results for input(s): CHOL, HDL, LDLCALC, TRIG, CHOLHDL, LDLDIRECT in the last 72 hours. Thyroid  Function Tests: No results for input(s): TSH, T4TOTAL, FREET4, T3FREE, THYROIDAB in the last 72 hours. Anemia Panel: No results for input(s): VITAMINB12, FOLATE, FERRITIN, TIBC, IRON, RETICCTPCT in the last 72 hours. Sepsis Labs: No results for input(s): PROCALCITON, LATICACIDVEN in the last 168 hours.  No results found for this or any previous visit (from the past 240 hour(s)).       Radiology Studies: No results found.      Scheduled Meds: . albuterol  2 puff Inhalation Q6H  . vitamin C  500 mg Oral Daily  . benzonatate  200  mg Oral BID  . chlorpheniramine-HYDROcodone  5 mL Oral QHS  . enoxaparin (LOVENOX) injection  100 mg Subcutaneous BID  . insulin aspart  0-20 Units Subcutaneous TID WC  . mirtazapine  7.5 mg Oral QHS  . traZODone  50 mg Oral QHS  . zinc sulfate  220 mg Oral Daily   Continuous Infusions:   Time spent: 25 minutes with over 50% of the time coordinating the patient's care    Jae Dire, DO [facility]   Call night coverage person covering after 7pm

## 2020-02-26 ENCOUNTER — Inpatient Hospital Stay (HOSPITAL_COMMUNITY): Payer: 59

## 2020-02-26 LAB — GLUCOSE, CAPILLARY
Glucose-Capillary: 132 mg/dL — ABNORMAL HIGH (ref 70–99)
Glucose-Capillary: 152 mg/dL — ABNORMAL HIGH (ref 70–99)

## 2020-02-26 MED ORDER — METHYLPREDNISOLONE SODIUM SUCC 125 MG IJ SOLR
50.0000 mg | Freq: Every day | INTRAMUSCULAR | Status: DC
  Administered 2020-02-26: 50 mg via INTRAVENOUS
  Filled 2020-02-26: qty 2

## 2020-02-26 NOTE — Progress Notes (Signed)
   02/26/20 2224  Assess: MEWS Score  Temp 98 F (36.7 C)  BP 101/61  Pulse Rate (!) 104  Resp (!) 22  SpO2 97 %  O2 Device Partial Rebreather Mask  Assess: MEWS Score  MEWS Temp 0  MEWS Systolic 0  MEWS Pulse 1  MEWS RR 1  MEWS LOC 0  MEWS Score 2  MEWS Score Color Yellow  Assess: if the MEWS score is Yellow or Red  Were vital signs taken at a resting state? Yes  Focused Assessment No change from prior assessment  Early Detection of Sepsis Score *See Row Information* Low  MEWS guidelines implemented *See Row Information* No, previously yellow, continue vital signs every 4 hours

## 2020-02-26 NOTE — Progress Notes (Signed)
PROGRESS NOTE    Christina Vincent    Code Status: Full Code  DDU:[id number] DOB: 1960/08/18 DOA: 02/04/2020 LOS: 21 days  PCP: Avon Gully, MD CC:  Chief Complaint  Patient presents with  . Shortness of Breath       Hospital Summary   This is a 60 year old female with past medical history of type 2 diabetes, hypertension, hyperlipidemia, obesity, depression who presented to the ED on 12/27 with fever, dyspnea and found to be COVID-19 positive with mild hypoxia.  CRP 16.6 and PCT negative on presentation.  Full scope of treatment commenced but subsequently had worsening hypoxia.  CTA chest 1/4 resealed airspace disease compatible progressive COVID-pneumonia as well as acute bilateral PE without right heart strain and started on full dose Lovenox.  She has had a prolonged hospital stay due to her O2 requirements and anxiety.    A & P   Principal Problem:   2019 novel coronavirus disease (COVID-19) Active Problems:   Hypertension   Type 2 diabetes mellitus (HCC)   Dyslipidemia   Depression   Class 1 obesity   Acute hypoxemic respiratory failure due to COVID-19 [facility])   Pulmonary emboli (HCC)   1. Acute hypoxic respiratory failure secondary to COVID-19 and acute bilateral segmental PE without right heart strain a. Continues to remain hypoxic but with slowly improved O2 requirements likely augmented by anxiety and atelectasis b. Completed treatment with steroid with taper, baricitinib x14 days and remdesivir c. Lifted airborne and contact precautions on 1/17 d. Continue full dose Lovenox e. Titrate O2 as able f. Hypoxic (100%->80%) when O2 turned down from 15-> 14 L/min today -> CXR, restart steroids g. Encourage OOB, Incentive spirometer  2. Type 2 diabetes with steroid-induced hyperglycemia a. Continue sliding scale and linagliptin  3. Hypertension a. Continue losartan  4. Anxiety related to healthcare a. Continue Xanax 0.5 mg 3 times daily as needed and nightly as  needed b. Reassurance given at bedside today c. Psychiatry consult for severe anxiety  5. Depression, insomnia a. Continue Remeron, trazodone and Xanax  6. LFT elevation, improved  7. Stress > urge incontinence a. Continue lifestyle modification as patient has declined medications  8. Constipation a. Patient declines laxatives   DVT prophylaxis: Lovenox   Family Communication: Patient's husband has been updated today  Disposition Plan: pending clinical improvement.  Status is: Inpatient  Remains inpatient appropriate because:Unsafe d/c plan   Dispo:  Patient From: Home  Planned Disposition: Home  Expected discharge date: 02/29/2020  Medically stable for discharge: No            Pressure injury documentation     none  Consultants  None  Procedures  None  Antibiotics   Anti-infectives (From admission, onward)   Start     Dose/Rate Route Frequency Ordered Stop   02/05/20 1600  remdesivir 100 mg in sodium chloride 0.9 % 100 mL IVPB        100 mg 200 mL/hr over 30 Minutes Intravenous Daily 02/04/20 2245 02/08/20 1055   02/04/20 2300  remdesivir 100 mg in sodium chloride 0.9 % 100 mL IVPB        100 mg 200 mL/hr over 30 Minutes Intravenous Every 30 min 02/04/20 2245 02/05/20 0129        Subjective   Patient seen and examined at bedside no acute distress and laying on right side. Head of the bed was lifted while she was laying on her side. She continues to complain of anxiety whenever trying  to wean from the O2 mask. She is asking for patience while trying to wean. At bedside I gave reassurance and asked if she would be ok if I tried to wean her O2. She agreed. I dropped the O2 from max 15 L/min to 14 L/min and SpO2 dropped from 100% to 80% with good waveform. She became tachypnic. I turned her O2 back to max and encouraged deep breathing. SpO2 returned to low 90s with improved respiratory rate. No chest pain or nausea. Tolerating diet.    Objective    Vitals:   02/25/20 2253 02/26/20 0647 02/26/20 0846 02/26/20 1451  BP: 114/62 (!) 137/103  (!) 139/124  Pulse: 94 (!) 111  (!) 110  Resp:  (!) 22  (!) 24  Temp: 97.6 F (36.4 C) 98.2 F (36.8 C)  98.2 F (36.8 C)  TempSrc: Oral Axillary    SpO2: 94% 92% 99% 95%  Weight:      Height:        Intake/Output Summary (Last 24 hours) at 02/26/2020 1649 Last data filed at 02/26/2020 1554 Gross per 24 hour  Intake 240 ml  Output --  Net 240 ml   Filed Weights   02/04/20 1838  Weight: 98.9 kg    Examination:  Physical Exam Vitals and nursing note reviewed.  HENT:     Head: Normocephalic.  Cardiovascular:     Rate and Rhythm: Normal rate and regular rhythm.  Pulmonary:     Effort: Tachypnea present. No accessory muscle usage.     Breath sounds: Normal breath sounds. No wheezing.  Musculoskeletal:     Right lower leg: No tenderness. No edema.     Left lower leg: No tenderness. No edema.  Skin:    General: Skin is warm.     Coloration: Skin is not cyanotic.  Neurological:     General: No focal deficit present.     Mental Status: She is alert.  Psychiatric:        Mood and Affect: Mood is anxious.     Data Reviewed: I have personally reviewed following labs and imaging studies  CBC: Recent Labs  Lab 02/22/20 0459 02/25/20 0438  WBC 14.5* 12.7*  HGB 11.6* 11.2*  HCT 35.9* 36.3  MCV 89.3 92.6  PLT 256 249   Basic Metabolic Panel: Recent Labs  Lab 02/20/20 0509 02/22/20 0459 02/25/20 0438  NA 136 135 137  K 4.5 4.4 4.5  CL 95* 92* 93*  CO2 29 31 30   GLUCOSE 116* 109* 136*  BUN 14 14 15   CREATININE 0.62 0.57 0.55  CALCIUM 8.5* 8.6* 8.9   GFR: Estimated Creatinine Clearance: 95.4 mL/min (by C-G formula based on SCr of 0.55 mg/dL). Liver Function Tests: Recent Labs  Lab 02/20/20 0509 02/22/20 0459 02/25/20 0438  AST 42* 38 32  ALT 86* 81* 60*  ALKPHOS 93 104 104  BILITOT 1.0 0.6 0.7  PROT 7.1 7.2 7.6  ALBUMIN 2.5* 2.2* 2.5*   No results for  input(s): LIPASE, AMYLASE in the last 168 hours. No results for input(s): AMMONIA in the last 168 hours. Coagulation Profile: No results for input(s): INR, PROTIME in the last 168 hours. Cardiac Enzymes: No results for input(s): CKTOTAL, CKMB, CKMBINDEX, TROPONINI in the last 168 hours. BNP (last 3 results) No results for input(s): PROBNP in the last 8760 hours. HbA1C: No results for input(s): HGBA1C in the last 72 hours. CBG: Recent Labs  Lab 02/25/20 1129 02/25/20 1652 02/25/20 2301 02/26/20 0747 02/26/20 1118  GLUCAP 135* 198* 126* 132* 152*   Lipid Profile: No results for input(s): CHOL, HDL, LDLCALC, TRIG, CHOLHDL, LDLDIRECT in the last 72 hours. Thyroid Function Tests: No results for input(s): TSH, T4TOTAL, FREET4, T3FREE, THYROIDAB in the last 72 hours. Anemia Panel: No results for input(s): VITAMINB12, FOLATE, FERRITIN, TIBC, IRON, RETICCTPCT in the last 72 hours. Sepsis Labs: No results for input(s): PROCALCITON, LATICACIDVEN in the last 168 hours.  No results found for this or any previous visit (from the past 240 hour(s)).       Radiology Studies: DG CHEST PORT 1 VIEW  Result Date: 02/26/2020 CLINICAL DATA:  Dyspnea, COVID with increased shortness of breath EXAM: PORTABLE CHEST 1 VIEW COMPARISON:  February 21, 2020 FINDINGS: Lung volumes are diminished with buckling of the trachea as result. Image rotated to the RIGHT. Cardiomediastinal contours are stable. Interstitial and airspace disease with similar appearance in the RIGHT chest accounting for rotation compared to February 21, 2020. Potential slight improvement in the LEFT chest. On limited assessment no acute skeletal process. IMPRESSION: Diminished lung volumes compared to the prior study with potential slight interval improvement in the LEFT chest, otherwise similar appearance of diffuse interstitial and airspace disease in this patient with COVID-19 pneumonia. Electronically Signed   By: Donzetta Kohut M.D.    On: 02/26/2020 13:57        Scheduled Meds: . albuterol  2 puff Inhalation Q6H  . vitamin C  500 mg Oral Daily  . benzonatate  200 mg Oral BID  . chlorpheniramine-HYDROcodone  5 mL Oral QHS  . enoxaparin (LOVENOX) injection  100 mg Subcutaneous BID  . insulin aspart  0-20 Units Subcutaneous TID WC  . methylPREDNISolone (SOLU-MEDROL) injection  50 mg Intravenous Daily  . mirtazapine  7.5 mg Oral QHS  . traZODone  50 mg Oral QHS  . zinc sulfate  220 mg Oral Daily   Continuous Infusions:   Time spent: 30 minutes with over 50% of the time coordinating the patient's care    Jae Dire, DO [facility]   Call night coverage person covering after 7pm

## 2020-02-26 NOTE — Progress Notes (Signed)
Rt went in to patients room to placed patient on 15 LPM HF due to patient sats was a 100% on NRB. Pt refused to be transition due to she wants her Xanax first. RT left patient on NRB and discusses plan with RN.

## 2020-02-26 NOTE — Progress Notes (Signed)
Physical Therapy Treatment Patient Details Name: Christina Vincent MRN: [id number] DOB: 02-14-1960 Today's Date: 02/26/2020    History of Present Illness Pt is 60 y.o. female with medical history significant of depression, dyslipidemia, type 2 diabetes, class I obesity, hypertension who is coming to the emergency department due to progressively worse shortness of breath for the past 6 days associated with fever, chills, fatigue, malaise, sore throat, occasionally productive of whitish/clear dry cough and rhinorrhea.  Patient is unvaccinated and has been admitted for acute hypoxemic respiratory failure secondary to COVID-19.  Pt also found to have PE 02/12/20    PT Comments    Pt tolerated transfer to chair but continues to be limited by anxiety with activity.  Reports she recovered quicker today with cues for relaxation and distraction.  She was on NRB mask at 15L with sats maintaining 98% but RR up to 34 breaths per min.  Continue to progress as able.      Follow Up Recommendations  Home health PT;Supervision/Assistance - 24 hour     Equipment Recommendations  Other (comment) (rollator)    Recommendations for Other Services       Precautions / Restrictions Precautions Precautions: Fall Precaution Comments: monitor sats, anxious    Mobility  Bed Mobility Overal bed mobility: Needs Assistance Bed Mobility: Supine to Sit     Supine to sit: Min guard     General bed mobility comments: Increased time and assist with lines; min guard to steady  Transfers Overall transfer level: Needs assistance Equipment used: 1 person hand held assist Transfers: Sit to/from Stand Sit to Stand: Min guard            Ambulation/Gait Ambulation/Gait assistance: Min assist Gait Distance (Feet): 2 Feet Assistive device: 1 person hand held assist Gait Pattern/deviations: Step-to pattern Gait velocity: decreased   General Gait Details: 2' to chair with HHA; pt with quick movements in order to  get to chair and rest   Stairs             Wheelchair Mobility    Modified Rankin (Stroke Patients Only)       Balance Overall balance assessment: Needs assistance Sitting-balance support: No upper extremity supported Sitting balance-Leahy Scale: Fair     Standing balance support: Single extremity supported Standing balance-Leahy Scale: Fair                              Cognition Arousal/Alertness: Awake/alert Behavior During Therapy: Anxious Overall Cognitive Status: Within Functional Limits for tasks assessed                                 General Comments: Pt anxious with activity - RN gave meds prior for anxiety.  Pt still anxious but reports recovered with with cues for relaxation, breathing, and distraction (talked about gardening/flowers)      Exercises      General Comments General comments (skin integrity, edema, etc.): Pt on 15 L NRB mask with sats 100% rest and 98% activity.  However, pt with RR increased to 34 with activity and reports panic attack.  Spent increased time providing cues for breathing, relaxation, and distraction.  Once pt recovered after ~10 mins she declined further activity.  Helped pt get comfortable with needs in reach.  Pt expressing desire to go home - encouraged increased activity as able (ex using BSC with nursing staff),  use of IS and flutter, and offered encouragement that sats stayed at 98% with activity.      Pertinent Vitals/Pain Pain Assessment: No/denies pain    Home Living                      Prior Function            PT Goals (current goals can now be found in the care plan section) Acute Rehab PT Goals Patient Stated Goal: to be independent again; go home PT Goal Formulation: With patient Time For Goal Achievement: 03/07/20 Potential to Achieve Goals: Fair Progress towards PT goals: Progressing toward goals    Frequency    Min 3X/week      PT Plan Current plan  remains appropriate    Co-evaluation              AM-PAC PT "6 Clicks" Mobility   Outcome Measure  Help needed turning from your back to your side while in a flat bed without using bedrails?: A Little Help needed moving from lying on your back to sitting on the side of a flat bed without using bedrails?: A Little Help needed moving to and from a bed to a chair (including a wheelchair)?: A Little Help needed standing up from a chair using your arms (e.g., wheelchair or bedside chair)?: A Little Help needed to walk in hospital room?: A Lot Help needed climbing 3-5 steps with a railing? : A Lot 6 Click Score: 16    End of Session Equipment Utilized During Treatment: Oxygen Activity Tolerance: Other (comment) (limited by anxiety) Patient left: in chair;with call bell/phone within reach Nurse Communication: Mobility status PT Visit Diagnosis: Other abnormalities of gait and mobility (R26.89)     Time: 1610-9604 PT Time Calculation (min) (ACUTE ONLY): 23 min  Charges:  $Therapeutic Activity: 23-37 mins                     Anise Salvo, PT Acute Rehab Services Pager [phone] [facility] [phone]     Rayetta Humphrey 02/26/2020, 4:40 PM

## 2020-02-26 NOTE — Progress Notes (Signed)
Patient NRB this am oxygen saturation at 100%.  This nurse communicated with respiratory therapist this am to attempt to wean patient off of NRB.  Patient is extremely high anxiety receiving xanax TID and HS.  Patient reluctant to transition to salter 15L HFNC.  Respiratory informed this nurse that patient requested xanax and needed to "calm down" prior to transition.  Respiratory did not remove NRB at this time.  Patient currently 100% on NRB.  Patient informed of importance of weaning oxygen when appropriate.  Will continue to monitor and attempt to wean.  Charge nurse notified of situation.

## 2020-02-26 NOTE — Plan of Care (Signed)
  Problem: Health Behavior/Discharge Planning: Goal: Ability to manage health-related needs will improve Outcome: Progressing   Problem: Clinical Measurements: Goal: Ability to maintain clinical measurements within normal limits will improve Outcome: Progressing Goal: Will remain free from infection Outcome: Progressing Goal: Diagnostic test results will improve Outcome: Progressing Goal: Respiratory complications will improve Outcome: Progressing Goal: Cardiovascular complication will be avoided Outcome: Progressing   Problem: Activity: Goal: Risk for activity intolerance will decrease Outcome: Progressing   Problem: Coping: Goal: Level of anxiety will decrease Outcome: Progressing   Problem: Elimination: Goal: Will not experience complications related to bowel motility Outcome: Progressing Goal: Will not experience complications related to urinary retention Outcome: Progressing   Problem: Pain Managment: Goal: General experience of comfort will improve Outcome: Progressing   Problem: Safety: Goal: Ability to remain free from injury will improve Outcome: Progressing   Problem: Skin Integrity: Goal: Risk for impaired skin integrity will decrease Outcome: Progressing   Problem: Education: Goal: Knowledge of risk factors and measures for prevention of condition will improve Outcome: Progressing   Problem: Coping: Goal: Psychosocial and spiritual needs will be supported Outcome: Progressing   Problem: Respiratory: Goal: Will maintain a patent airway Outcome: Progressing Goal: Complications related to the disease process, condition or treatment will be avoided or minimized Outcome: Progressing   

## 2020-02-27 ENCOUNTER — Other Ambulatory Visit (HOSPITAL_COMMUNITY): Payer: Self-pay

## 2020-02-27 DIAGNOSIS — F329 Major depressive disorder, single episode, unspecified: Secondary | ICD-10-CM

## 2020-02-27 LAB — BASIC METABOLIC PANEL
Anion gap: 13 (ref 5–15)
BUN: 18 mg/dL (ref 6–20)
CO2: 32 mmol/L (ref 22–32)
Calcium: 9.1 mg/dL (ref 8.9–10.3)
Chloride: 96 mmol/L — ABNORMAL LOW (ref 98–111)
Creatinine, Ser: 0.4 mg/dL — ABNORMAL LOW (ref 0.44–1.00)
GFR, Estimated: >60 mL/min (ref >60)
Glucose, Bld: 147 mg/dL — ABNORMAL HIGH (ref 70–99)
Potassium: 4.6 mmol/L (ref 3.5–5.1)
Sodium: 141 mmol/L (ref 135–145)

## 2020-02-27 LAB — CBC
HCT: 37.3 % (ref 36.0–46.0)
Hemoglobin: 11.4 g/dL — ABNORMAL LOW (ref 12.0–15.0)
MCH: 28.6 pg (ref 26.0–34.0)
MCHC: 30.6 g/dL (ref 30.0–36.0)
MCV: 93.5 fL (ref 80.0–100.0)
Platelets: 333 10*3/uL (ref 150–400)
RBC: 3.99 MIL/uL (ref 3.87–5.11)
RDW: 14 % (ref 11.5–15.5)
WBC: 12.7 10*3/uL — ABNORMAL HIGH (ref 4.0–10.5)
nRBC: 0 % (ref 0.0–0.2)

## 2020-02-27 LAB — GLUCOSE, CAPILLARY
Glucose-Capillary: 127 mg/dL — ABNORMAL HIGH (ref 70–99)
Glucose-Capillary: 143 mg/dL — ABNORMAL HIGH (ref 70–99)
Glucose-Capillary: 189 mg/dL — ABNORMAL HIGH (ref 70–99)
Glucose-Capillary: 132 mg/dL — ABNORMAL HIGH (ref 70–99)
Glucose-Capillary: 147 mg/dL — ABNORMAL HIGH (ref 70–99)

## 2020-02-27 LAB — TSH: TSH: 0.308 u[IU]/mL — ABNORMAL LOW (ref 0.350–4.500)

## 2020-02-27 MED ORDER — CLONAZEPAM 0.5 MG PO TABS
0.5000 mg | ORAL_TABLET | Freq: Two times a day (BID) | ORAL | Status: DC | PRN
Start: 2020-02-27 — End: 2020-02-29
  Administered 2020-02-27 – 2020-02-29 (×4): 0.5 mg via ORAL
  Filled 2020-02-27 (×4): qty 1

## 2020-02-27 MED ORDER — TRAZODONE HCL 50 MG PO TABS
50.0000 mg | ORAL_TABLET | Freq: Every evening | ORAL | Status: DC | PRN
  Administered 2020-02-28: 50 mg via ORAL
  Filled 2020-02-27: qty 1

## 2020-02-27 MED ORDER — QUETIAPINE FUMARATE 25 MG PO TABS
12.5000 mg | ORAL_TABLET | Freq: Two times a day (BID) | ORAL | Status: DC
  Administered 2020-02-27 – 2020-03-02 (×9): 12.5 mg via ORAL
  Filled 2020-02-27 (×9): qty 1

## 2020-02-27 NOTE — Progress Notes (Signed)
Chaplain visited patient in response to spiritual consult.  Patient anxious & grieving.  Patient shared that her mother died two years ago, and she is still grieving. Her mother died of Covid (Note: patient had breathing apparatus so it made it difficult to understand a few words-this may not be quite correct)  Ms. Shaver said her husband was a pastor "I'm a first lady."  She believes what she is going through now is a spiritual battle and that the [place] is after her.  She is missing her mother, especially because up to her death, her mother they lived together "There was no one else to take care of her."  Chaplain listened to more of her story of her stay.  Chaplain offered  a story from the [place] for her to remember, and verses from the Bible to help her rely on God's strength in times of adversity. She nodded in appreciation and thanked the chaplain for the visit.  Chaplain and patient ended visit with Psalm 23. She asked for some time alone to ponder things before a nurse comes in her room.  Will continue to visit patient if she is here on Friday. Rev. Lynnell Chad Pager [phone]

## 2020-02-27 NOTE — Consult Note (Addendum)
[facility] Face-to-Face Psychiatry Consult   Reason for Consult:  Severe anxiety Referring Physician:  Dr. Jarvis Newcomer Patient Identification: Christina Vincent MRN:  [id number] Principal Diagnosis: 2017/06/06 novel coronavirus disease (COVID-19) Diagnosis:  Principal Problem:   2017-06-06 novel coronavirus disease (COVID-19) Active Problems:   Hypertension   Type 2 diabetes mellitus (HCC)   Dyslipidemia   Depression   Class 1 obesity   Acute hypoxemic respiratory failure due to COVID-19 [facility])   Pulmonary emboli (HCC)   Total Time spent with patient: 30 minutes  Subjective:   Christina Vincent is a 60 y.o. female patient admitted with hypoxic respiratory failure 2/t COVID -19 and PE. Psych consult placed for severe anxiety related to healthcare. Patient is seen and Clinical research associate introduced self. Patient is wearing a NRB mask, and able to speak only a few words at a time. She endorses a significant amount of anxiety related to her breathing. In addition to her respiratory discomfort she endorses ruminating thoughts and visions of her mother(who died of Covid in 06/06/17). She states she was her mothers primary care take and she was with her from beginning to the end. SHe reports a history of depression that was previously stable and well controlled on mirtazapine and Lexapro.  She was receiving outpatient behavioral health services [facility]  She was compliant with monthly visits up until she lost insurance, in 06/07/2018.  She denies any previous history of anxiety, however endorses symptoms of panic that include smothering sensation, shortness of breath, hyperventilation, and tremulous at times.  At this time she denies any thoughts of wanting to harm herself.  She denies any suicidal ideation, homicidal ideation, and auditory visual hallucination.   HPI:  Christina Vincent is a 60 y.o. female with medical history significant of depression, dyslipidemia, type 2 diabetes, class I obesity,  hypertension who is coming to the emergency department due to progressively worse shortness of breath for the past 6 days associated with fever, chills, fatigue, malaise, sore throat, occasionally productive of whitish/clear dry cough and rhinorrhea.  She denies travel history or known Covid exposures.  She has not been vaccinated with any other COVID-19 vaccines.  On ROS, she also complains of dizziness and pleuritic chest pain, but denies palpitations, diaphoresis, PND, orthopnea or pitting edema of the lower extremities.  She denies abdominal pain, nausea, vomiting, diarrhea, constipation, melena or hematochezia.  No dysuria, frequency or hematuria.  No polyuria, polydipsia, polyphagia or blurred vision.  Past Psychiatric History: Depression, previously stable on mirtazapine and Lexapro.  Was receiving monthly services from [facility] behavioral [facility] outpatient in [place].  She denies any history of suicide attempts.  She denies any previous inpatient admission.  She denies any substance abuse.  Risk to Self:  No Risk to Others:  No Prior Inpatient Therapy:  Denies Prior Outpatient Therapy:  See above  Past Medical History:  Past Medical History:  Diagnosis Date  . Depression   . Dyslipidemia     Past Surgical History:  Procedure Laterality Date  . COLONOSCOPY     1990's; hemorrhoids   Family History:  Family History  Problem Relation Age of Onset  . Alcohol abuse Father   . Colon cancer Neg Hx    Family Psychiatric  History: Denies Social History:  Social History   Substance and Sexual Activity  Alcohol Use Yes   Comment: occ glass of wine     Social History   Substance and Sexual Activity  Drug Use Never    Social History   Socioeconomic History  . Marital status: Married    Spouse name: Not on file  . Number of children: Not on file  . Years of education: Not on file  . Highest education level: Not on file  Occupational History  . Not on file   Tobacco Use  . Smoking status: Former Games developer  . Smokeless tobacco: Never Used  . Tobacco comment: quit 1988  Vaping Use  . Vaping Use: Never used  Substance and Sexual Activity  . Alcohol use: Yes    Comment: occ glass of wine  . Drug use: Never  . Sexual activity: Not on file  Other Topics Concern  . Not on file  Social History Narrative  . Not on file   Social Determinants of Health   Financial Resource Strain: Not on file  Food Insecurity: Not on file  Transportation Needs: Not on file  Physical Activity: Not on file  Stress: Not on file  Social Connections: Not on file   Additional Social History:    Allergies:  No Known Allergies  Labs:  Results for orders placed or performed during the hospital encounter of 02/04/20 (from the past 48 hour(s))  Glucose, capillary     Status: Abnormal   Collection Time: 02/25/20 11:29 AM  Result Value Ref Range   Glucose-Capillary 135 (H) 70 - 99 mg/dL    Comment: Glucose reference range applies only to samples taken after fasting for at least 8 hours.  Glucose, capillary     Status: Abnormal   Collection Time: 02/25/20  4:52 PM  Result Value Ref Range   Glucose-Capillary 198 (H) 70 - 99 mg/dL    Comment: Glucose reference range applies only to samples taken after fasting for at least 8 hours.  Glucose, capillary     Status: Abnormal   Collection Time: 02/25/20 11:01 PM  Result Value Ref Range   Glucose-Capillary 126 (H) 70 - 99 mg/dL    Comment: Glucose reference range applies only to samples taken after fasting for at least 8 hours.  Glucose, capillary     Status: Abnormal   Collection Time: 02/26/20  7:47 AM  Result Value Ref Range   Glucose-Capillary 132 (H) 70 - 99 mg/dL    Comment: Glucose reference range applies only to samples taken after fasting for at least 8 hours.  Glucose, capillary     Status: Abnormal   Collection Time: 02/26/20 11:18 AM  Result Value Ref Range   Glucose-Capillary 152 (H) 70 - 99 mg/dL     Comment: Glucose reference range applies only to samples taken after fasting for at least 8 hours.  CBC     Status: Abnormal   Collection Time: 02/27/20  4:45 AM  Result Value Ref Range   WBC 12.7 (H) 4.0 - 10.5 K/uL   RBC 3.99 3.87 - 5.11 MIL/uL   Hemoglobin 11.4 (L) 12.0 - 15.0 g/dL   HCT 26.8 34.1 - 96.2 %   MCV 93.5 80.0 - 100.0 fL   MCH 28.6 26.0 - 34.0 pg   MCHC 30.6 30.0 - 36.0 g/dL   RDW 22.9 79.8 - 92.1 %   Platelets 333 150 - 400 K/uL   nRBC 0.0 0.0 - 0.2 %    Comment: Performed at [facility], [address] [place], [place] [address]  Basic metabolic panel     Status: Abnormal   Collection Time: 02/27/20  4:45 AM  Result  Value Ref Range   Sodium 141 135 - 145 mmol/L   Potassium 4.6 3.5 - 5.1 mmol/L   Chloride 96 (L) 98 - 111 mmol/L   CO2 32 22 - 32 mmol/L   Glucose, Bld 147 (H) 70 - 99 mg/dL    Comment: Glucose reference range applies only to samples taken after fasting for at least 8 hours.   BUN 18 6 - 20 mg/dL   Creatinine, Ser 7.25 (L) 0.44 - 1.00 mg/dL   Calcium 9.1 8.9 - 36.6 mg/dL   GFR, Estimated >44 >03 mL/min    Comment: (NOTE) Calculated using the CKD-EPI Creatinine Equation (2021)    Anion gap 13 5 - 15    Comment: Performed at [facility], [address] [place], [place] [address]  Glucose, capillary     Status: Abnormal   Collection Time: 02/27/20  7:25 AM  Result Value Ref Range   Glucose-Capillary 143 (H) 70 - 99 mg/dL    Comment: Glucose reference range applies only to samples taken after fasting for at least 8 hours.    Current Facility-Administered Medications  Medication Dose Route Frequency Provider Last Rate Last Admin  . acetaminophen (TYLENOL) tablet 650 mg  650 mg Oral Q6H PRN Maurilio Lovely D, DO   650 mg at 02/23/20 1752  . albuterol (VENTOLIN HFA) 108 (90 Base) MCG/ACT inhaler 2 puff  2 puff Inhalation Q6H Rolly Salter, MD   2 puff at 02/27/20 0904  . ALPRAZolam Prudy Feeler) tablet 0.5 mg  0.5 mg  Oral TID PRN Tyrone Nine, MD   0.5 mg at 02/26/20 1756  . ALPRAZolam Prudy Feeler) tablet 0.5 mg  0.5 mg Oral QHS PRN Tyrone Nine, MD   0.5 mg at 02/24/20 2303  . ascorbic acid (VITAMIN C) tablet 500 mg  500 mg Oral Daily Bobette Mo, MD   500 mg at 02/27/20 9563  . benzonatate (TESSALON) capsule 200 mg  200 mg Oral BID Rolly Salter, MD   200 mg at 02/27/20 0904  . chlorpheniramine-HYDROcodone (TUSSIONEX) 10-8 MG/5ML suspension 5 mL  5 mL Oral Q12H PRN Bobette Mo, MD   5 mL at 02/12/20 0300  . chlorpheniramine-HYDROcodone (TUSSIONEX) 10-8 MG/5ML suspension 5 mL  5 mL Oral QHS Rolly Salter, MD   5 mL at 02/26/20 2206  . enoxaparin (LOVENOX) injection 100 mg  100 mg Subcutaneous BID Lucia Gaskins, RPH   100 mg at 02/27/20 0905  . guaiFENesin-dextromethorphan (ROBITUSSIN DM) 100-10 MG/5ML syrup 10 mL  10 mL Oral Q4H PRN Bobette Mo, MD   10 mL at 02/20/20 2215  . insulin aspart (novoLOG) injection 0-20 Units  0-20 Units Subcutaneous TID WC Bobette Mo, MD   3 Units at 02/27/20 [id number]  . lip balm (CARMEX) ointment   Topical PRN Rolly Salter, MD   Given at 02/10/20 1537  . magic mouthwash  15 mL Oral TID PRN Tyrone Nine, MD   15 mL at 02/18/20 2201  . mirtazapine (REMERON) tablet 7.5 mg  7.5 mg Oral QHS Shah, Pratik D, DO   7.5 mg at 02/26/20 2206  . sodium chloride (OCEAN) 0.65 % nasal spray 1 spray  1 spray Each Nare PRN Rolly Salter, MD   1 spray at 02/10/20 1536  . traZODone (DESYREL) tablet 50 mg  50 mg Oral QHS Tyrone Nine, MD   50 mg at 02/26/20 2206  . zinc sulfate capsule 220 mg  220 mg Oral Daily Bobette Mortiz, David Manuel, MD   220 mg at 02/27/20 [id number]    Musculoskeletal: Strength & Muscle Tone: within normal limits Gait & Station: unsteady Patient leans: N/A  Psychiatric Specialty Exam: Physical Exam  Review of Systems  Blood pressure 116/66, pulse (!) 109, temperature 98.4 F (36.9 C), temperature source Oral, resp. rate (!) 25, height 5\' 8"  (1.727  m), weight 98.9 kg, SpO2 100 %.Body mass index is 33.15 kg/m.  General Appearance: Disheveled  Eye Contact:  Fair  Speech:  Clear and Coherent and Slow  Volume:  Normal  Mood:  Anxious  Affect:  Congruent  Thought Process:  Coherent, Linear and Descriptions of Associations: Intact  Orientation:  Full (Time, Place, and Person)  Thought Content:  Logical, Hallucinations: Visual, Rumination and Tangential  Suicidal Thoughts:  No  Homicidal Thoughts:  No  Memory:  Immediate;   Fair Recent;   Fair Remote;   Fair  Judgement:  Intact  Insight:  Fair  Psychomotor Activity:  Normal  Concentration:  Concentration: Fair and Attention Span: Fair  Recall:  [employer]Fair  Fund of Knowledge:  Fair  Language:  Fair  Akathisia:  No  Handed:  Right  AIMS (if indicated):     Assets:  Communication Skills Desire for Improvement Financial Resources/Insurance Housing Resilience Social Support Transportation  ADL's:  Intact  Cognition:  WNL  Sleep:        Treatment Plan Summary: Consult chaplain for grief and spirtual services.  Will adjust medications as follows.  Anxiety:  -DC xanax 0.5mg  po TID and QHS prn. WIll start klonopin 0.5mg  po BID prn, patient feels as though the xanax is effective however does not last long. Will obtain TSH.   -Patient with Hypnagogic hallucinations that are vivid and involve seeing her dead mother who also passed away from COVID 19 in 2020. Will start Seroquel 12.5mg  po BID for treatment resistant anxiety and hallucinations. Will obtain EKG, last obtained 12/27 QTc 469.  -Will keep mirtazapine at the same dose as patient has experienced increased sedation at higher dose.   -Insomnia-Adding a sedating medication (seroquel) Will make Trazodone prn at this time. Patient to continue mirtazapine 7.5mg  po qhs.   -Recommend working closely with social work to secure outpatient behavioral health appointment for management of anxiety and depression. [facility], as she is uninsured at this time.   -Recommend referral to hospice for support groups for caregivers and bereavement. Will place consult for chaplain.  Disposition: No evidence of imminent risk to self or others at present.   Patient does not meet criteria for psychiatric inpatient admission. Supportive therapy provided about ongoing stressors. Refer to IOP. Discussed crisis plan, support from social network, calling 911, coming to the Emergency Department, and calling Suicide Hotline.  Maryagnes Amosakia S Starkes-Perry, FNP 02/27/2020 10:37 AM

## 2020-02-27 NOTE — Progress Notes (Signed)
Occupational Therapy Treatment Patient Details Name: Christina Vincent MRN: [id number] DOB: 15-Jan-1961 Today's Date: 02/27/2020    History of present illness Pt is 59 y.o. female with medical history significant of depression, dyslipidemia, type 2 diabetes, class I obesity, hypertension who is coming to the emergency department due to progressively worse shortness of breath for the past 6 days associated with fever, chills, fatigue, malaise, sore throat, occasionally productive of whitish/clear dry cough and rhinorrhea.  Patient is unvaccinated and has been admitted for acute hypoxemic respiratory failure secondary to COVID-19.  Pt also found to have PE 02/12/20   OT comments  Treatment limited by patient's anxiety. Despite increased time, continues use of PNB and encouragement patient only able to sit up partially at the edge of bed almost 2 minutes. Patient has not progressed towards goals and GOC need to be extended.    Follow Up Recommendations  SNF;LTACH    Equipment Recommendations  3 in 1 bedside commode;Tub/shower seat    Recommendations for Other Services      Precautions / Restrictions Precautions Precautions: Fall Precaution Comments: monitor sats, anxious Restrictions Weight Bearing Restrictions: No       Mobility Bed Mobility Overal bed mobility: Needs Assistance Bed Mobility: Supine to Sit     Supine to sit: Min assist;HOB elevated     General bed mobility comments: Patient sat up in bed with min assist (more tactile cues to initiate activity). Tolerated sitting at edge of bed almost 2 minutes.  Transfers                      Balance Overall balance assessment: Mild deficits observed, not formally tested                                         ADL either performed or assessed with clinical judgement   ADL                                               Vision       Perception     Praxis      Cognition  Arousal/Alertness: Awake/alert Behavior During Therapy: Anxious Overall Cognitive Status: Within Functional Limits for tasks assessed                                 General Comments: Premedicated but anxiety still significantly limits treatment.        Exercises     Shoulder Instructions       General Comments      Pertinent Vitals/ Pain       Pain Assessment: No/denies pain  Home Living                                          Prior Functioning/Environment              Frequency  Min 2X/week        Progress Toward Goals  OT Goals(current goals can now be found in the care plan section)  Progress towards OT goals: Not progressing toward goals - comment (Continues to  be lmited by anxiety and not progressing activity)  Acute Rehab OT Goals Patient Stated Goal: to be independent again; go home OT Goal Formulation: With patient Time For Goal Achievement: 03/12/20 Potential to Achieve Goals: Fair  Plan Discharge plan remains appropriate    Co-evaluation                 AM-PAC OT "6 Clicks" Daily Activity     Outcome Measure   Help from another person eating meals?: None Help from another person taking care of personal grooming?: None Help from another person toileting, which includes using toliet, bedpan, or urinal?: A Little Help from another person bathing (including washing, rinsing, drying)?: A Little Help from another person to put on and taking off regular upper body clothing?: A Little Help from another person to put on and taking off regular lower body clothing?: A Little 6 Click Score: 20    End of Session Equipment Utilized During Treatment: Oxygen  OT Visit Diagnosis: Other (comment) (COVID)   Activity Tolerance Other (comment) (anxiety)   Patient Left     Nurse Communication Mobility status        Time: 3474-2595 OT Time Calculation (min): 16 min  Charges: OT General Charges $OT Visit: 1  Visit OT Treatments $Therapeutic Activity: 8-22 mins  [name], OTR/L Acute Care Rehab Services  Office [phone] Pager: [phone]    [name] 02/27/2020, 4:22 PM

## 2020-02-27 NOTE — Progress Notes (Signed)
PROGRESS NOTE  Christina Vincent  KYH:[id number] DOB: [birth year] DOA: 02/04/2020 PCP: Avon Gully, MD   Brief Narrative: Christina Vincent is a 60 y.o. female with a history of T2DM, HTN, HLD, obesity and depression who presented with fever and dyspnea found to have positive SARS-CoV-2 PCR and mild hypoxia despite clear CXR on 12/27. CRP 16.6, PCT negative. Full scope treatment commenced with subsequent worsening of hypoxia. CTA chest 1/4 revealed worsening airspace disease compatible with progressive covid pneumonia as well as acute bilateral pulmonary emboli without right heart strain. Severe hypoxemia continues. CXR repeated 1/13 confirms extensive but stable infiltrates bilaterally.  Assessment & Plan: Principal Problem:   2017-05-27 novel coronavirus disease (COVID-19) Active Problems:   Hypertension   Type 2 diabetes mellitus (HCC)   Dyslipidemia   Depression   Class 1 obesity   Acute hypoxemic respiratory failure due to COVID-19 [facility])   Pulmonary emboli (HCC)  Acute hypoxemic respiratory failure due to covid-19 pneumonia and PE: SARS-CoV-2 positive on 02/04/2020.  - Remains significantly hypoxemic with unchanged significant diffuse infiltrates on CXR at last check. Will continue efforts at therapy, OOB, IS, FV, and proning. Repeat intermittent CXR.  - Completed remdesivir - Completed steroid with taper and baricitinib x14 days (02/19/2020). Inflammatory phase is passed and steroids unlikely to help at this time and likely to exacerbate hyperglycemia and anxiety. Will not continue these.   Segmental PE: No right heart strain. - Continue lovenox 1mg /kg q12h.   T2DM with steroid-induced hyperglycemia: HbA1c 7.7%.  - Continue current management  Anxiety, insomnia, hypnagogic hallucinations: Exacerbated by mother's death from covid May 28, 2018.  - Psychiatry consulted, added seroquel BID (ECG pending, last QTc 2021), change xanax to clonazepam. Continue remeron, change trazodone to prn.  -  Outpatient support will be needed.  - Chaplain consulted.   HTN:  - Continue losartan  LFT elevation: Mild and stable. Continue intermittent monitoring.  Mouth pain: Resolved.  - prn magic mouthwash.   Stress > urge urinary incontinence:  - Pt declines initiation of medication, will continue lifestyle modifications.  Obesity: Estimated body mass index is 33.15 kg/m as calculated from the following:   Height as of this encounter: 5\' 8"  (1.727 m).   Weight as of this encounter: 98.9 kg.  DVT prophylaxis: Lovenox Code Status: Full Family Communication: None at bedside today Disposition Plan:  Status is: Inpatient  Remains inpatient appropriate because:Inpatient level of care appropriate due to severity of illness  Dispo:  Patient From: Home  Planned Disposition: Home; will look into LTAC options.  Expected discharge date: >3 days  Medically stable for discharge: No  Consultants:   None  Procedures:   None  Antimicrobials:  Remdesivir   Subjective: Still severely anxious with most movement, unwilling to wean oxygen. Mouth breather so mask works better than nasal cannula. No chest pain or BM recently, declines Tx.   Objective: Vitals:   02/27/20 0733 02/27/20 0809 02/27/20 1255 02/27/20 1702  BP: 116/66  114/66 98/66  Pulse: (!) 109  (!) 107 99  Resp: (!) 25  (!) 25 (!) 26  Temp: 98.4 F (36.9 C)  98.6 F (37 C) 98 F (36.7 C)  TempSrc: Oral  Oral Oral  SpO2: 99% 100% 95%   Weight:      Height:        Intake/Output Summary (Last 24 hours) at 02/27/2020 1726 Last data filed at 02/27/2020 0700 Gross per 24 hour  Intake --  Output 200 ml  Net -200 ml  Filed Weights   02/04/20 1838  Weight: 98.9 kg   Gen: 60 y.o. female in no distress Pulm: Nonlabored breathing mask 15L NRB, crackles bilaterally are improved. CV: Regular rate and rhythm. No murmur, rub, or gallop. No JVD, no dependent edema. GI: Abdomen soft, non-tender, non-distended, with  normoactive bowel sounds.  Ext: Warm, no deformities Skin: No rashes, lesions or ulcers on visualized skin. Neuro: Alert and oriented. No focal neurological deficits. Psych: Judgement and insight appear fair. Mood anxious with congruent affect. No AVH    Data Reviewed: I have personally reviewed following labs and imaging studies  CBC: Recent Labs  Lab 02/22/20 0459 02/25/20 0438 02/27/20 0445  WBC 14.5* 12.7* 12.7*  HGB 11.6* 11.2* 11.4*  HCT 35.9* 36.3 37.3  MCV 89.3 92.6 93.5  PLT 256 249 333   Basic Metabolic Panel: Recent Labs  Lab 02/22/20 0459 02/25/20 0438 02/27/20 0445  NA 135 137 141  K 4.4 4.5 4.6  CL 92* 93* 96*  CO2 31 30 32  GLUCOSE 109* 136* 147*  BUN 14 15 18   CREATININE 0.57 0.55 0.40*  CALCIUM 8.6* 8.9 9.1   GFR: Estimated Creatinine Clearance: 95.4 mL/min (A) (by C-G formula based on SCr of 0.4 mg/dL (L)). Liver Function Tests: Recent Labs  Lab 02/22/20 0459 02/25/20 0438  AST 38 32  ALT 81* 60*  ALKPHOS 104 104  BILITOT 0.6 0.7  PROT 7.2 7.6  ALBUMIN 2.2* 2.5*    Scheduled Meds: . albuterol  2 puff Inhalation Q6H  . vitamin C  500 mg Oral Daily  . benzonatate  200 mg Oral BID  . chlorpheniramine-HYDROcodone  5 mL Oral QHS  . enoxaparin (LOVENOX) injection  100 mg Subcutaneous BID  . insulin aspart  0-20 Units Subcutaneous TID WC  . mirtazapine  7.5 mg Oral QHS  . QUEtiapine  12.5 mg Oral BID  . zinc sulfate  220 mg Oral Daily   Continuous Infusions:   LOS: 22 days   Time spent: 25 minutes.  02/27/20, MD [facility] [email or web address] 02/27/2020, 5:26 PM

## 2020-02-27 NOTE — Plan of Care (Signed)
  Problem: Health Behavior/Discharge Planning: Goal: Ability to manage health-related needs will improve Outcome: Progressing   Problem: Clinical Measurements: Goal: Ability to maintain clinical measurements within normal limits will improve Outcome: Progressing Goal: Will remain free from infection Outcome: Progressing Goal: Diagnostic test results will improve Outcome: Progressing Goal: Respiratory complications will improve Outcome: Progressing Goal: Cardiovascular complication will be avoided Outcome: Progressing   Problem: Activity: Goal: Risk for activity intolerance will decrease Outcome: Progressing   Problem: Coping: Goal: Level of anxiety will decrease Outcome: Progressing   Problem: Elimination: Goal: Will not experience complications related to bowel motility Outcome: Progressing Goal: Will not experience complications related to urinary retention Outcome: Progressing   Problem: Pain Managment: Goal: General experience of comfort will improve Outcome: Progressing   Problem: Safety: Goal: Ability to remain free from injury will improve Outcome: Progressing   Problem: Skin Integrity: Goal: Risk for impaired skin integrity will decrease Outcome: Progressing   Problem: Education: Goal: Knowledge of risk factors and measures for prevention of condition will improve Outcome: Progressing   Problem: Coping: Goal: Psychosocial and spiritual needs will be supported Outcome: Progressing   Problem: Respiratory: Goal: Will maintain a patent airway Outcome: Progressing Goal: Complications related to the disease process, condition or treatment will be avoided or minimized Outcome: Progressing   

## 2020-02-28 ENCOUNTER — Encounter (HOSPITAL_COMMUNITY): Payer: Self-pay

## 2020-02-28 ENCOUNTER — Inpatient Hospital Stay (HOSPITAL_COMMUNITY): Payer: 59

## 2020-02-28 ENCOUNTER — Ambulatory Visit (HOSPITAL_COMMUNITY): Admit: 2020-02-28 | Payer: PRIVATE HEALTH INSURANCE | Admitting: Internal Medicine

## 2020-02-28 ENCOUNTER — Encounter (HOSPITAL_COMMUNITY): Payer: Self-pay | Admitting: Internal Medicine

## 2020-02-28 DIAGNOSIS — J9602 Acute respiratory failure with hypercapnia: Secondary | ICD-10-CM

## 2020-02-28 DIAGNOSIS — I2699 Other pulmonary embolism without acute cor pulmonale: Secondary | ICD-10-CM

## 2020-02-28 LAB — GLUCOSE, CAPILLARY
Glucose-Capillary: 128 mg/dL — ABNORMAL HIGH (ref 70–99)
Glucose-Capillary: 186 mg/dL — ABNORMAL HIGH (ref 70–99)
Glucose-Capillary: 141 mg/dL — ABNORMAL HIGH (ref 70–99)

## 2020-02-28 LAB — T4, FREE: Free T4: 1.11 ng/dL (ref 0.61–1.12)

## 2020-02-28 SURGERY — COLONOSCOPY WITH PROPOFOL
Anesthesia: Monitor Anesthesia Care

## 2020-02-28 MED ORDER — MOMETASONE FURO-FORMOTEROL FUM 200-5 MCG/ACT IN AERO
2.0000 | INHALATION_SPRAY | Freq: Two times a day (BID) | RESPIRATORY_TRACT | Status: DC
  Administered 2020-02-28 – 2020-03-01 (×5): 2 via RESPIRATORY_TRACT
  Filled 2020-02-28: qty 8.8

## 2020-02-28 NOTE — Consult Note (Signed)
NAME:  Christina Vincent, MRN:  [id number], DOB:  10-22-1960, LOS: 23 ADMISSION DATE:  02/04/2020, CONSULTATION DATE:  1/20 REFERRING MD:  Jarvis Newcomer, CHIEF COMPLAINT:  Refractory hypoxia in setting of COVID   Brief History:  61 year old female admitted on 12/27 with positive COVID.  Has been treated with full scope of available therapies including: High flow supplemental oxygen, systemic steroids, remdesivir, and baricitinib.  In spite of this continues to have fairly high supplemental oxygen needs and diffuse pulmonary infiltrates on chest x-ray.  Pulmonary asked to evaluate and give further recommendations if able.  History of Present Illness:   This is a 60 year old female patient who was initially admitted on 12/27 with chief complaint of fever and shortness of breath, found to have positive COVID PCR, with mild hypoxia.  Initial chest x-ray was essentially clear, CRP was 16.6, she was treated with full scope of therapy including systemic steroids for hypoxia, remdesivir, 14 days of baricitinib, and supplemental oxygen. Her course has been since complicated by progressive and now persistent respiratory failure requiring ongoing heated high flow oxygen, acute bilateral pulmonary emboli without evidence of right heart strain. In spite of all aggressive therapies she continues to require high supplemental oxygen dosing, patient's been fairly anxious in regards to her lack of progress.  Pulmonary asked to evaluate to determine if there were any other potential therapies and to discuss with patient concern about possible fibrotic related injuries at this point.  Past Medical History:  Type 2 diabetes, hypertension, obesity, hyperlipidemia, depression  Significant Hospital Events:  12/28-->1/20. admitted on 12/27 with chief complaint of fever and shortness of breath, found to have positive COVID PCR, with mild hypoxia.  Initial chest x-ray was essentially clear, CRP was 16.6, she was treated with full scope  of therapy including systemic steroids for hypoxia, remdesivir, 14 days of baricitinib, and supplemental oxygen. Her course has been since complicated by progressive and now persistent respiratory failure requiring ongoing heated high flow oxygen,  Consults:  pulm 1/20 Psych 1/19 Procedures:   Significant Diagnostic Tests:  CT angiogram 1/4: Acute bilateral lower lobe and right middle lobe segmental pulmonary emboli. No evidence of right heart strain. Multifocal pneumonia with worsening of aeration since 02/07/2020.  Micro Data:    Antimicrobials:   Interim History / Subjective:  Short of breath w/ minimal exertion.  Objective   Blood pressure 136/77, pulse (Abnormal) 121, temperature 98.4 F (36.9 C), resp. rate (Abnormal) 40, height 5\' 8"  (1.727 m), weight 98.9 kg, SpO2 (Abnormal) 89 %.        Intake/Output Summary (Last 24 hours) at 02/28/2020 1448 Last data filed at 02/27/2020 1700 Gross per 24 hour  Intake no documentation  Output 50 ml  Net -50 ml   Filed Weights   02/04/20 1838  Weight: 98.9 kg    Examination: General: this is a 60 year old female who is still on high fio2 including high flow and NRB./ not in acute distress BUT does get sig SOB w/ minimal exertion HENT: NCAT MMM no JVD Lungs: crackles both bases. FIO2 as above SOB talking  Cardiovascular: RRR no MRG  Abdomen: soft not tender  Extremities: warm and dry  Neuro: awake and oriented no focal def  GU: voids  Resolved Hospital Problem list    Assessment & Plan:   Hypoxic respiratory failure covid related Pulm fibrosis Pulmonary emboli Possible reactive airway disease HTN  Persistent hypoxic respiratory failure s/p COVID w/ what appears to be covid related pulmonary fibrosis.  -  has had all available treatment modalities w/out improvement  -does seem to have some response to SABA suggesting perhaps an element of reactive airway disease.  Plan Cont supplemental oxygen-->hopefully with time she  will need less but she will need to go home with this.  Will add Dulera in addition to PRN SABA Will need to see her in our office. There we can follow up on PFTs and eventually CT imaging.  Have encouraged her to stay active. If after 3-6 months her oxygen requirements stay elevated but she is still active and only has single organ involvement may possibly be a candidate for referral to transplant center Cont systemic AC   Best practice (evaluated daily)  Per primary  CBC: Recent Labs  Lab 02/22/20 0459 02/25/20 0438 02/27/20 0445  WBC 14.5* 12.7* 12.7*  HGB 11.6* 11.2* 11.4*  HCT 35.9* 36.3 37.3  MCV 89.3 92.6 93.5  PLT 256 249 333    Basic Metabolic Panel: Recent Labs  Lab 02/22/20 0459 02/25/20 0438 02/27/20 0445  NA 135 137 141  K 4.4 4.5 4.6  CL 92* 93* 96*  CO2 31 30 32  GLUCOSE 109* 136* 147*  BUN 14 15 18   CREATININE 0.57 0.55 0.40*  CALCIUM 8.6* 8.9 9.1   GFR: Estimated Creatinine Clearance: 95.4 mL/min (A) (by C-G formula based on SCr of 0.4 mg/dL (L)). Recent Labs  Lab 02/22/20 0459 02/25/20 0438 02/27/20 0445  WBC 14.5* 12.7* 12.7*    Liver Function Tests: Recent Labs  Lab 02/22/20 0459 02/25/20 0438  AST 38 32  ALT 81* 60*  ALKPHOS 104 104  BILITOT 0.6 0.7  PROT 7.2 7.6  ALBUMIN 2.2* 2.5*   No results for input(s): LIPASE, AMYLASE in the last 168 hours. No results for input(s): AMMONIA in the last 168 hours.  ABG No results found for: PHART, PCO2ART, PO2ART, HCO3, TCO2, ACIDBASEDEF, O2SAT   Coagulation Profile: No results for input(s): INR, PROTIME in the last 168 hours.  Cardiac Enzymes: No results for input(s): CKTOTAL, CKMB, CKMBINDEX, TROPONINI in the last 168 hours.  HbA1C: Hgb A1c MFr Bld  Date/Time Value Ref Range Status  02/14/2020 05:39 AM 7.7 (H) 4.8 - 5.6 % Final    Comment:    (NOTE) Pre diabetes:          5.7%-6.4%  Diabetes:              >6.4%  Glycemic control for   <7.0% adults with diabetes      CBG: Recent Labs  Lab 02/27/20 1109 02/27/20 1704 02/27/20 2132 02/28/20 0751 02/28/20 1147  GLUCAP 189* 132* 147* 128* 186*    Review of Systems:   Review of Systems  Constitutional: Positive for malaise/fatigue.  HENT: Negative.   Eyes: Negative.   Respiratory: Positive for cough and shortness of breath. Negative for sputum production.   Cardiovascular: Negative.   Gastrointestinal: Negative.   Genitourinary: Negative.   Musculoskeletal: Negative.   Skin: Negative.   Neurological: Negative.   Endo/Heme/Allergies: Negative.   Psychiatric/Behavioral: Negative.     Past Medical History:  She,  has a past medical history of Depression and Dyslipidemia.   Surgical History:   Past Surgical History:  Procedure Laterality Date  . COLONOSCOPY     1990's; hemorrhoids     Social History:   reports that she has quit smoking. She has never used smokeless tobacco. She reports current alcohol use. She reports that she does not use drugs.   Family History:  Her  family history includes Alcohol abuse in her father. There is no history of Colon cancer.   Allergies No Known Allergies   Home Medications  Prior to Admission medications   Medication Sig Start Date End Date Taking? Authorizing Provider  atorvastatin (LIPITOR) 10 MG tablet Take 10 mg by mouth daily. 11/16/19  Yes [provider]  D3-50 1.25 MG (50000 UT) capsule Take 50,000 Units by mouth every 30 (thirty) days. 01/29/20  Yes [provider]  losartan (COZAAR) 50 MG tablet Take 50 mg by mouth daily. 09/02/19  Yes [provider]  metFORMIN (GLUCOPHAGE) 850 MG tablet Take 850 mg by mouth 2 (two) times daily. 11/16/19  Yes [provider]  mirtazapine (REMERON) 15 MG tablet Take 0.5 tablets (7.5 mg total) by mouth at bedtime. 11/22/18  Yes Neysa Hotter, MD  Multiple Vitamin (MULTIVITAMIN ADULT PO) Take by mouth daily.   Yes [provider]  VITAMIN D PO Take by mouth  every 30 (thirty) days.   Yes [provider]  escitalopram (LEXAPRO) 20 MG tablet Take 1 tablet (20 mg total) by mouth daily. Patient not taking: No sig reported 11/22/18   Neysa Hotter, MD     Critical care time: NA   Simonne Martinet ACNP-BC [facility] Pulmonary/Critical Care Pager # [phone] OR # [phone] if no answer

## 2020-02-28 NOTE — Progress Notes (Signed)
PROGRESS NOTE  Christina Vincent  ZOX:[id number] DOB: [birth year] DOA: 02/04/2020 PCP: Avon Gully, MD   Brief Narrative: Christina Vincent is a 60 y.o. female with a history of T2DM, HTN, HLD, obesity and depression who presented with fever and dyspnea found to have positive SARS-CoV-2 PCR and mild hypoxia despite clear CXR on 12/27. CRP 16.6, PCT negative. Full scope treatment commenced with subsequent worsening of hypoxia. CTA chest 1/4 revealed worsening airspace disease compatible with progressive covid pneumonia as well as acute bilateral pulmonary emboli without right heart strain. Severe hypoxemia continues. Isolation period has been completed while still hospitalized. CXR repeated 1/13, 1/18 and 1/20 have continued to demonstrate significant heterogenous opacities bilaterally. Psychiatry was consulted for medical management of concomitant anxiety. PCCM consulted for further recommendations 02/28/2020.  Assessment & Plan: Principal Problem:   Jun 08, 2017 novel coronavirus disease (COVID-19) Active Problems:   Hypertension   Type 2 diabetes mellitus (HCC)   Dyslipidemia   Depression   Class 1 obesity   Acute hypoxemic respiratory failure due to COVID-19 [facility])   Pulmonary emboli (HCC)  Acute hypoxemic respiratory failure due to covid-19 pneumonia and PE: SARS-CoV-2 positive on 02/04/2020.  - Remains significantly hypoxemic with unchanged significant diffuse infiltrates on CXR on my personal review this morning. Will continue efforts at therapy, OOB, IS, FV, and proning.  - PCCM, Dr. Chestine Spore, consulted for further recommendations.  - Completed remdesivir - Completed steroid with taper and baricitinib x14 days (02/19/2020). Inflammatory phase is passed and steroids unlikely to help at this time and likely to exacerbate hyperglycemia and anxiety. Will not continue these. I'm concerned we are entering fibrotic phase, though left sided infiltrates are diminished somewhat from serial exams on CXR this  AM.  Segmental PE: No right heart strain. - Continue lovenox 1mg /kg q12h.   T2DM with steroid-induced hyperglycemia: HbA1c 7.7%.  - Continue current management  Anxiety, insomnia, hypnagogic hallucinations: Exacerbated by mother's death from covid 06-09-18.  - Psychiatry consulted, added seroquel BID (ECG pending, last QTc 2021), change xanax to clonazepam. Continue remeron, change trazodone to prn.  - Outpatient support will be needed.  - Chaplain consulted.   Abnormal TSH:  - Add on T3, T4.   HTN:  - Continue losartan  LFT elevation: Mild and stable. Continue intermittent monitoring.  Mouth pain: Resolved.  - prn magic mouthwash.   Stress > urge urinary incontinence:  - Pt declines initiation of medication, will continue lifestyle modifications.  Obesity: Estimated body mass index is 33.15 kg/m as calculated from the following:   Height as of this encounter: 5\' 8"  (1.727 m).   Weight as of this encounter: 98.9 kg.  DVT prophylaxis: Lovenox Code Status: Full Family Communication: None at bedside today Disposition Plan:  Status is: Inpatient  Remains inpatient appropriate because:Inpatient level of care appropriate due to severity of illness  Dispo:  Patient From: Home  Planned Disposition: Home with Health Care Svc; will look into LTAC options.  Expected discharge date: >3 days  Medically stable for discharge: No  Consultants:   PCCM  Psychiatry  Procedures:   None  Antimicrobials:  Remdesivir   Subjective: With medication changes did not sleep very much last night at all. Has shortness of breath at rest improved with prolonged meditative breathing with mask. Not on nasal cannula for many days, but prefers to have this. Shortness of breath is about the same as it has been for several days.   Objective: Vitals:   02/28/20 0619 02/28/20 1150 02/28/20 1155 02/28/20  1156  BP: 104/74   103/66  Pulse: 96   (!) 117  Resp: 20   (!) 30  Temp: 98 F (36.7  C)   98.2 F (36.8 C)  TempSrc: Oral   Oral  SpO2: 92% (!) 84% 94% 100%  Weight:      Height:        Intake/Output Summary (Last 24 hours) at 02/28/2020 1354 Last data filed at 02/27/2020 1700 Gross per 24 hour  Intake --  Output 50 ml  Net -50 ml   Filed Weights   02/04/20 1838  Weight: 98.9 kg   Gen: 60 y.o. female in no distress Pulm: Labored tachypnea with speaking or with an exertion. When rising to seated position, SpO2 dropped to 60% with accurate pleth and associated increased WOB. Diffusely diminished with scattered crackles noted. SpO2 rose slowly to 89-90% with 15L HFNC added and with 15L NRB. CV: Regular rate and rhythm. No murmur, rub, or gallop. No JVD, no dependent edema. GI: Abdomen soft, non-tender, non-distended, with normoactive bowel sounds.  Ext: Warm, no deformities Skin: No rashes, lesions or ulcers on visualized skin. Neuro: Alert and oriented. No focal neurological deficits. Psych: Judgement and insight appear fair. Mood anious & affect congruent. Behavior is appropriate.    Data Reviewed: I have personally reviewed following labs and imaging studies  CBC: Recent Labs  Lab 02/22/20 0459 02/25/20 0438 02/27/20 0445  WBC 14.5* 12.7* 12.7*  HGB 11.6* 11.2* 11.4*  HCT 35.9* 36.3 37.3  MCV 89.3 92.6 93.5  PLT 256 249 333   Basic Metabolic Panel: Recent Labs  Lab 02/22/20 0459 02/25/20 0438 02/27/20 0445  NA 135 137 141  K 4.4 4.5 4.6  CL 92* 93* 96*  CO2 31 30 32  GLUCOSE 109* 136* 147*  BUN 14 15 18   CREATININE 0.57 0.55 0.40*  CALCIUM 8.6* 8.9 9.1   GFR: Estimated Creatinine Clearance: 95.4 mL/min (A) (by C-G formula based on SCr of 0.4 mg/dL (L)). Liver Function Tests: Recent Labs  Lab 02/22/20 0459 02/25/20 0438  AST 38 32  ALT 81* 60*  ALKPHOS 104 104  BILITOT 0.6 0.7  PROT 7.2 7.6  ALBUMIN 2.2* 2.5*    Scheduled Meds: . albuterol  2 puff Inhalation Q6H  . vitamin C  500 mg Oral Daily  . benzonatate  200 mg Oral BID   . chlorpheniramine-HYDROcodone  5 mL Oral QHS  . enoxaparin (LOVENOX) injection  100 mg Subcutaneous BID  . insulin aspart  0-20 Units Subcutaneous TID WC  . mirtazapine  7.5 mg Oral QHS  . QUEtiapine  12.5 mg Oral BID  . zinc sulfate  220 mg Oral Daily   Continuous Infusions:   LOS: 23 days   Time spent: 35 minutes.  02/27/20, MD [facility] [email or web address] 02/28/2020, 1:54 PM

## 2020-02-28 NOTE — Progress Notes (Signed)
Physical Therapy Treatment Patient Details Name: Christina Vincent MRN: [id number] DOB: 03/31/1960 Today's Date: 02/28/2020    History of Present Illness Pt is 60 y.o. female with medical history significant of depression, dyslipidemia, type 2 diabetes, class I obesity, hypertension who is coming to the emergency department due to progressively worse shortness of breath for the past 6 days associated with fever, chills, fatigue, malaise, sore throat, occasionally productive of whitish/clear dry cough and rhinorrhea.  Patient is unvaccinated and has been admitted for acute hypoxemic respiratory failure secondary to COVID-19.  Pt also found to have PE 02/12/20    PT Comments    Pt very cooperative but progressing slowly and requiring multiple extended rest breaks with mobility 2* SOB with min exertion exacerbated by elevated anxiety level.  Pt performing bed mobility tasks for hygiene, up to EOB sitting and stood to ambulate short distance up to top of bed.  Pt declined up to chair and returned to bed stating she had not slept last night and wished to rest prior to spouse's visit.  Follow Up Recommendations  Home health PT;Supervision/Assistance - 24 hour     Equipment Recommendations  Other (comment)    Recommendations for Other Services       Precautions / Restrictions Precautions Precautions: Fall Precaution Comments: monitor sats, anxious Restrictions Weight Bearing Restrictions: No    Mobility  Bed Mobility Overal bed mobility: Needs Assistance Bed Mobility: Supine to Sit;Rolling Rolling: Supervision   Supine to sit: Min assist;HOB elevated Sit to supine: Min guard   General bed mobility comments: Pt rolling side to side and performed bridge for hygiene, pad replacement and donning mesh panties with pad. Use of bedrail and min assist to move sidely to sit  Transfers Overall transfer level: Needs assistance Equipment used: 1 person hand held assist Transfers: Sit to/from  Stand Sit to Stand: Min guard         General transfer comment: Steady assist  Ambulation/Gait Ambulation/Gait assistance: Min assist Gait Distance (Feet): 3 Feet Assistive device: 1 person hand held assist Gait Pattern/deviations: Step-to pattern Gait velocity: decreased   General Gait Details: 3' to move up side of bed   Stairs             Wheelchair Mobility    Modified Rankin (Stroke Patients Only)       Balance Overall balance assessment: Needs assistance Sitting-balance support: No upper extremity supported Sitting balance-Leahy Scale: Fair     Standing balance support: Single extremity supported Standing balance-Leahy Scale: Fair                              Cognition Arousal/Alertness: Awake/alert Behavior During Therapy: Anxious Overall Cognitive Status: Within Functional Limits for tasks assessed                                 General Comments: Premedicated but anxiety still significantly limits treatment.      Exercises      General Comments General comments (skin integrity, edema, etc.): Pt on 15L NRB with sats 98% at rest but desat as low as 78% with limited activity.  Pt with increased anxiety and increased RR and focused on breathing techniques.  Multiple rests required with all tasks.      Pertinent Vitals/Pain Pain Assessment: No/denies pain    Home Living  Prior Function            PT Goals (current goals can now be found in the care plan section) Acute Rehab PT Goals Patient Stated Goal: to be independent again; go home PT Goal Formulation: With patient Time For Goal Achievement: 03/07/20 Potential to Achieve Goals: Fair Progress towards PT goals: Progressing toward goals    Frequency    Min 3X/week      PT Plan Current plan remains appropriate    Co-evaluation              AM-PAC PT "6 Clicks" Mobility   Outcome Measure  Help needed turning from  your back to your side while in a flat bed without using bedrails?: A Little Help needed moving from lying on your back to sitting on the side of a flat bed without using bedrails?: A Little Help needed moving to and from a bed to a chair (including a wheelchair)?: A Little Help needed standing up from a chair using your arms (e.g., wheelchair or bedside chair)?: A Little Help needed to walk in hospital room?: A Lot Help needed climbing 3-5 steps with a railing? : A Lot 6 Click Score: 16    End of Session Equipment Utilized During Treatment: Oxygen Activity Tolerance: Patient limited by fatigue;Other (comment) (anxiety, SOB) Patient left: in bed;with call bell/phone within reach;with chair alarm set Nurse Communication: Mobility status PT Visit Diagnosis: Other abnormalities of gait and mobility (R26.89)     Time: 1325-1405 PT Time Calculation (min) (ACUTE ONLY): 40 min  Charges:  $Gait Training: 8-22 mins $Therapeutic Activity: 8-22 mins                     [name] PT Acute Rehabilitation Services Pager [phone] Office [phone]    [name] 02/28/2020, 4:05 PM

## 2020-02-28 NOTE — Plan of Care (Signed)
  Problem: Clinical Measurements: Goal: Will remain free from infection Outcome: Progressing Goal: Diagnostic test results will improve Outcome: Progressing Goal: Respiratory complications will improve Outcome: Progressing   Problem: Activity: Goal: Risk for activity intolerance will decrease Outcome: Progressing   Problem: Pain Managment: Goal: General experience of comfort will improve Outcome: Progressing   Problem: Safety: Goal: Ability to remain free from injury will improve Outcome: Progressing   Problem: Skin Integrity: Goal: Risk for impaired skin integrity will decrease Outcome: Progressing

## 2020-02-29 LAB — GLUCOSE, CAPILLARY
Glucose-Capillary: 150 mg/dL — ABNORMAL HIGH (ref 70–99)
Glucose-Capillary: 161 mg/dL — ABNORMAL HIGH (ref 70–99)
Glucose-Capillary: 145 mg/dL — ABNORMAL HIGH (ref 70–99)
Glucose-Capillary: 138 mg/dL — ABNORMAL HIGH (ref 70–99)
Glucose-Capillary: 126 mg/dL — ABNORMAL HIGH (ref 70–99)

## 2020-02-29 MED ORDER — ALPRAZOLAM 0.5 MG PO TABS
0.5000 mg | ORAL_TABLET | Freq: Three times a day (TID) | ORAL | Status: DC | PRN
  Administered 2020-02-29 – 2020-03-13 (×19): 0.5 mg via ORAL
  Filled 2020-02-29 (×22): qty 1

## 2020-02-29 MED ORDER — MIRTAZAPINE 15 MG PO TABS
15.0000 mg | ORAL_TABLET | Freq: Every day | ORAL | Status: DC
  Administered 2020-02-29 – 2020-03-12 (×11): 15 mg via ORAL
  Filled 2020-02-29 (×13): qty 1

## 2020-02-29 MED ORDER — TRAZODONE HCL 50 MG PO TABS
50.0000 mg | ORAL_TABLET | Freq: Every day | ORAL | Status: DC
  Administered 2020-02-29 – 2020-03-12 (×11): 50 mg via ORAL
  Filled 2020-02-29 (×13): qty 1

## 2020-02-29 NOTE — Plan of Care (Signed)
  Problem: Clinical Measurements: Goal: Ability to maintain clinical measurements within normal limits will improve Outcome: Progressing Goal: Will remain free from infection Outcome: Progressing Goal: Respiratory complications will improve Outcome: Progressing   Problem: Activity: Goal: Risk for activity intolerance will decrease Outcome: Progressing   Problem: Coping: Goal: Level of anxiety will decrease Outcome: Progressing   Problem: Pain Managment: Goal: General experience of comfort will improve Outcome: Progressing   Problem: Safety: Goal: Ability to remain free from injury will improve Outcome: Progressing   Problem: Skin Integrity: Goal: Risk for impaired skin integrity will decrease Outcome: Progressing   

## 2020-02-29 NOTE — Progress Notes (Signed)
PROGRESS NOTE  [name]  JGG:[id number] DOB: [birth year] DOA: 02/04/2020 PCP: Avon Gully, MD   Brief Narrative: Christina Vincent is a 60 y.o. female with a history of T2DM, HTN, HLD, obesity and depression who presented with fever and dyspnea found to have positive SARS-CoV-2 PCR and mild hypoxia despite clear CXR on 12/27. CRP 16.6, PCT negative. Full scope treatment commenced with subsequent worsening of hypoxia. CTA chest 1/4 revealed worsening airspace disease compatible with progressive covid pneumonia as well as acute bilateral pulmonary emboli without right heart strain. Severe hypoxemia continues. Isolation period has been completed while still hospitalized. CXR repeated 1/13, 1/18 and 1/20 have continued to demonstrate significant heterogenous opacities bilaterally. Psychiatry was consulted for medical management of concomitant anxiety. PCCM consulted for further recommendations 02/28/2020.  Assessment & Plan: Principal Problem:   05/20/17 novel coronavirus disease (COVID-19) Active Problems:   Hypertension   Type 2 diabetes mellitus (HCC)   Dyslipidemia   Depression   Class 1 obesity   Acute hypoxemic respiratory failure due to COVID-19 [facility])   Pulmonary emboli (HCC)  Acute hypoxemic respiratory failure due to covid-19 pneumonia and PE: SARS-CoV-2 positive on 02/04/2020.  - Continue efforts at therapy, OOB, IS, FV, and proning.  - Completed remdesivir - Completed steroid with taper and baricitinib x14 days (02/19/2020). Inflammatory phase is passed and steroids unlikely to help at this time and likely to exacerbate hyperglycemia and anxiety. PCCM consulted 02/28/2020, recommended criteria for discharge is 6L O2 requirement with hopes of weaning further at home and considering transplant if otherwise doing well at time of pulmonary follow up.  Segmental PE: No right heart strain. - Continue lovenox 1mg /kg q12h.   T2DM with steroid-induced hyperglycemia: HbA1c 7.7%.  - Continue  current management, improved with steroid off.  Anxiety, insomnia, hypnagogic hallucinations: Exacerbated by mother's death from covid 05-21-18.  - Psychiatry consulted, added seroquel BID (ECG pending, last QTc 2021). We will change back to xanax which controlled symptoms much better than clonazepam. Will increase remeron dosing and schedule trazodone. - Outpatient support will be needed.  - Chaplain consulted.   Abnormal TSH: Free T4 wnl.  - T3 pending.  HTN:  - Continue losartan  LFT elevation: Mild and stable. Continue intermittent monitoring.  Mouth pain: Resolved.  - prn magic mouthwash.   Stress > urge urinary incontinence:  - Pt declines initiation of medication, will continue lifestyle modifications.  Obesity: Estimated body mass index is 33.15 kg/m as calculated from the following:   Height as of this encounter: 5\' 8"  (1.727 m).   Weight as of this encounter: 98.9 kg.  DVT prophylaxis: Lovenox Code Status: Full Family Communication: Husband by phone Disposition Plan:  Status is: Inpatient  Remains inpatient appropriate because:Inpatient level of care appropriate due to severity of illness  Dispo:  Patient From: Home  Planned Disposition: Home with Health Care Svc    Expected discharge date: >3 days  Medically stable for discharge: No  Consultants:   PCCM  Psychiatry  Procedures:   None  Antimicrobials:  Remdesivir   Subjective: Very poor sleep last night, still severely anxious which is not improved with clonazepam, was controlled with xanax. Insomnia is worse since medication changes. Devastated by poor prognosis given by PCCM yesterday, wants to fight to get home on 6L O2. Willing to get up regularly.  Objective: Vitals:   02/28/20 1930 02/28/20 May 20, 2112 02/29/20 0526 02/29/20 0738  BP:  106/68 (!) 105/57 104/70  Pulse: 96 (!) 102 95 (!) 105  Resp:  20 20 (!) 26  Temp:  97.7 F (36.5 C) 98 F (36.7 C) 99.6 F (37.6 C)  TempSrc:  Oral  Axillary   SpO2: 97% 98% 96% 99%  Weight:      Height:        Intake/Output Summary (Last 24 hours) at 02/29/2020 1508 Last data filed at 02/29/2020 0530 Gross per 24 hour  Intake --  Output 800 ml  Net -800 ml   Filed Weights   02/04/20 1838  Weight: 98.9 kg   Gen: 60 y.o. female in no distress Pulm: Labored with longer sentences with HFNC + NRB, crackles stable.  CV: Regular tachycardia. No murmur, rub, or gallop. No JVD, no dependent edema. GI: Abdomen soft, non-tender, non-distended, with normoactive bowel sounds.  Ext: Warm, no deformities Skin: No new rashes, lesions or ulcers on visualized skin. Neuro: Alert and oriented. No focal neurological deficits. Psych: Judgement and insight appear fair. Mood anxious & affect congruent. Behavior is appropriate.    Data Reviewed: I have personally reviewed following labs and imaging studies  CBC: Recent Labs  Lab 02/25/20 0438 02/27/20 0445  WBC 12.7* 12.7*  HGB 11.2* 11.4*  HCT 36.3 37.3  MCV 92.6 93.5  PLT 249 333   Basic Metabolic Panel: Recent Labs  Lab 02/25/20 0438 02/27/20 0445  NA 137 141  K 4.5 4.6  CL 93* 96*  CO2 30 32  GLUCOSE 136* 147*  BUN 15 18  CREATININE 0.55 0.40*  CALCIUM 8.9 9.1   GFR: Estimated Creatinine Clearance: 95.4 mL/min (A) (by C-G formula based on SCr of 0.4 mg/dL (L)). Liver Function Tests: Recent Labs  Lab 02/25/20 0438  AST 32  ALT 60*  ALKPHOS 104  BILITOT 0.7  PROT 7.6  ALBUMIN 2.5*    Scheduled Meds: . albuterol  2 puff Inhalation Q6H  . vitamin C  500 mg Oral Daily  . benzonatate  200 mg Oral BID  . chlorpheniramine-HYDROcodone  5 mL Oral QHS  . enoxaparin (LOVENOX) injection  100 mg Subcutaneous BID  . insulin aspart  0-20 Units Subcutaneous TID WC  . mirtazapine  15 mg Oral QHS  . mometasone-formoterol  2 puff Inhalation BID  . QUEtiapine  12.5 mg Oral BID  . traZODone  50 mg Oral QHS  . zinc sulfate  220 mg Oral Daily   Continuous Infusions:   LOS: 24 days    Time spent: 35 minutes.  Tyrone Nine, MD [facility] [email or web address] 02/29/2020, 3:08 PM

## 2020-03-01 LAB — GLUCOSE, CAPILLARY
Glucose-Capillary: 136 mg/dL — ABNORMAL HIGH (ref 70–99)
Glucose-Capillary: 125 mg/dL — ABNORMAL HIGH (ref 70–99)
Glucose-Capillary: 149 mg/dL — ABNORMAL HIGH (ref 70–99)
Glucose-Capillary: 201 mg/dL — ABNORMAL HIGH (ref 70–99)

## 2020-03-01 LAB — T3, FREE: T3, Free: 2.2 pg/mL (ref 2.0–4.4)

## 2020-03-01 NOTE — Progress Notes (Signed)
PROGRESS NOTE  Christina Vincent  GEZ:[id number] DOB: Jan 16, 1961 DOA: 02/04/2020 PCP: Avon Gully, MD   Brief Narrative: Christina Vincent is a 60 y.o. female with a history of T2DM, HTN, HLD, obesity and depression who presented with fever and dyspnea found to have positive SARS-CoV-2 PCR and mild hypoxia despite clear CXR on 12/27. CRP 16.6, PCT negative. Full scope treatment commenced with subsequent worsening of hypoxia. CTA chest 1/4 revealed worsening airspace disease compatible with progressive covid pneumonia as well as acute bilateral pulmonary emboli without right heart strain. Severe hypoxemia continues. Isolation period has been completed while still hospitalized. CXR repeated 1/13, 1/18 and 1/20 have continued to demonstrate significant heterogenous opacities bilaterally. Psychiatry was consulted for medical management of concomitant anxiety. PCCM consulted for further recommendations 02/28/2020.  Assessment & Plan: Principal Problem:   2017/05/27 novel coronavirus disease (COVID-19) Active Problems:   Hypertension   Type 2 diabetes mellitus (HCC)   Dyslipidemia   Depression   Class 1 obesity   Acute hypoxemic respiratory failure due to COVID-19 [facility])   Pulmonary emboli (HCC)  Acute hypoxemic respiratory failure due to covid-19 pneumonia and PE: SARS-CoV-2 positive on 02/04/2020.  - Continue efforts at therapy, OOB, IS, FV, and proning.  - Completed remdesivir - Completed steroid with taper and baricitinib x14 days (02/19/2020). - PCCM consulted 02/28/2020, recommended criteria for discharge is 6L O2 requirement with hopes of weaning further at home and considering transplant if otherwise doing well at time of pulmonary follow up.  Segmental PE: No right heart strain. - Continue lovenox 1mg /kg q12h.   T2DM with steroid-induced hyperglycemia: HbA1c 7.7%.  - Continue current management, improved with steroid off.  Anxiety, insomnia, hypnagogic hallucinations: Exacerbated by mother's  death from covid 2018/05/28.  - Psychiatry consulted, added seroquel BID. - We changed back to xanax 0.5mg  po TID prn which controls symptoms much better than clonazepam.  - Continue remeron and trazodone scheduled. - Outpatient support will be needed.  - Chaplain consulted.   Abnormal TSH: Free T4 and T3 wnl.  - Recheck after convalescence  HTN:  - Continue losartan  LFT elevation: Mild and stable. Continue intermittent monitoring.  Mouth pain: Resolved.  - prn magic mouthwash.   Stress > urge urinary incontinence:  - Pt declines initiation of medication, will continue lifestyle modifications.  Obesity: Estimated body mass index is 33.15 kg/m as calculated from the following:   Height as of this encounter: 5\' 8"  (1.727 m).   Weight as of this encounter: 98.9 kg.  DVT prophylaxis: Lovenox Code Status: Full Family Communication: Husband by phone 1/21. Disposition Plan:  Status is: Inpatient  Remains inpatient appropriate because:Inpatient level of care appropriate due to severity of illness  Dispo:  Patient From: Home  Planned Disposition: Home with Health Care Svc    Expected discharge date: >3 days  Medically stable for discharge: No  Consultants:   PCCM  Psychiatry  Procedures:   None  Antimicrobials:  Remdesivir   Subjective: Anxiety and insomnia much better controlled. Pt happy with progress in this regard. Still not major changes in dyspnea. No chest pain.   Objective: Vitals:   02/29/20 1526 02/29/20 2122 03/01/20 0702 03/01/20 0900  BP: 109/65 102/60 111/71   Pulse: (!) 103 (!) 106 99   Resp: (!) 27 20 20    Temp: 97.8 F (36.6 C) 98.4 F (36.9 C) 98.2 F (36.8 C)   TempSrc: Axillary Oral Oral   SpO2: 100% 96% (!) 86% 90%  Weight:  Height:        Intake/Output Summary (Last 24 hours) at 03/01/2020 1309 Last data filed at 03/01/2020 0600 Gross per 24 hour  Intake 240 ml  Output 200 ml  Net 40 ml   Filed Weights   02/04/20 1838   Weight: 98.9 kg   Gen: 60 y.o. female in no distress Pulm: Tachypneic without accessory muscle use at rest, crackles stable. SpO2 94% with HFNC + NRB at rest, rolls over due to dyspnea when trying to rise to sit for pulm exam. CV: Regular tachycardia. No murmur, rub, or gallop. No JVD, no dependent edema. GI: Abdomen soft, non-tender, non-distended, with normoactive bowel sounds.  Ext: Warm, no deformities Skin: No rashes, lesions or ulcers on visualized skin. Neuro: Alert and oriented. No focal neurological deficits. Psych: Judgement and insight appear fair. Mood euthymic & affect congruent. Behavior is appropriate.    Data Reviewed: I have personally reviewed following labs and imaging studies  CBC: Recent Labs  Lab 02/25/20 0438 02/27/20 0445  WBC 12.7* 12.7*  HGB 11.2* 11.4*  HCT 36.3 37.3  MCV 92.6 93.5  PLT 249 333   Basic Metabolic Panel: Recent Labs  Lab 02/25/20 0438 02/27/20 0445  NA 137 141  K 4.5 4.6  CL 93* 96*  CO2 30 32  GLUCOSE 136* 147*  BUN 15 18  CREATININE 0.55 0.40*  CALCIUM 8.9 9.1   GFR: Estimated Creatinine Clearance: 95.4 mL/min (A) (by C-G formula based on SCr of 0.4 mg/dL (L)). Liver Function Tests: Recent Labs  Lab 02/25/20 0438  AST 32  ALT 60*  ALKPHOS 104  BILITOT 0.7  PROT 7.6  ALBUMIN 2.5*    Scheduled Meds: . albuterol  2 puff Inhalation Q6H  . vitamin C  500 mg Oral Daily  . benzonatate  200 mg Oral BID  . chlorpheniramine-HYDROcodone  5 mL Oral QHS  . enoxaparin (LOVENOX) injection  100 mg Subcutaneous BID  . insulin aspart  0-20 Units Subcutaneous TID WC  . mirtazapine  15 mg Oral QHS  . mometasone-formoterol  2 puff Inhalation BID  . QUEtiapine  12.5 mg Oral BID  . traZODone  50 mg Oral QHS  . zinc sulfate  220 mg Oral Daily   Continuous Infusions:   LOS: 25 days   Time spent: 25 minutes.  Tyrone Nine, MD [facility] [email or web address] 03/01/2020, 1:09 PM

## 2020-03-02 ENCOUNTER — Inpatient Hospital Stay (HOSPITAL_COMMUNITY): Payer: 59

## 2020-03-02 DIAGNOSIS — G9341 Metabolic encephalopathy: Secondary | ICD-10-CM

## 2020-03-02 LAB — BLOOD GAS, ARTERIAL
Acid-Base Excess: 15.2 mmol/L — ABNORMAL HIGH (ref 0.0–2.0)
Acid-Base Excess: 15.1 mmol/L — ABNORMAL HIGH (ref 0.0–2.0)
Bicarbonate: 46.7 mmol/L — ABNORMAL HIGH (ref 20.0–28.0)
Bicarbonate: 48.3 mmol/L — ABNORMAL HIGH (ref 20.0–28.0)
Delivery systems: POSITIVE
Drawn by: 560031
Drawn by: 560031
FIO2: 100
FIO2: 70
O2 Saturation: 98.6 %
O2 Saturation: 98.6 %
Patient temperature: 98.6
Patient temperature: 98.6
RATE: 20 resp/min
pCO2 arterial: 119 mmHg (ref 32.0–48.0)
pCO2 arterial: 122 mmHg (ref 32.0–48.0)
pH, Arterial: 7.218 — ABNORMAL LOW (ref 7.350–7.450)
pH, Arterial: 7.222 — ABNORMAL LOW (ref 7.350–7.450)
pO2, Arterial: 169 mmHg — ABNORMAL HIGH (ref 83.0–108.0)
pO2, Arterial: 174 mmHg — ABNORMAL HIGH (ref 83.0–108.0)

## 2020-03-02 LAB — CBC WITH DIFFERENTIAL/PLATELET
Abs Immature Granulocytes: 0.05 10*3/uL (ref 0.00–0.07)
Basophils Absolute: 0 10*3/uL (ref 0.0–0.1)
Basophils Relative: 0 %
Eosinophils Absolute: 0 10*3/uL (ref 0.0–0.5)
Eosinophils Relative: 0 %
HCT: 38.6 % (ref 36.0–46.0)
Hemoglobin: 11.2 g/dL — ABNORMAL LOW (ref 12.0–15.0)
Immature Granulocytes: 1 %
Lymphocytes Relative: 8 %
Lymphs Abs: 0.6 10*3/uL — ABNORMAL LOW (ref 0.7–4.0)
MCH: 28.4 pg (ref 26.0–34.0)
MCHC: 29 g/dL — ABNORMAL LOW (ref 30.0–36.0)
MCV: 98 fL (ref 80.0–100.0)
Monocytes Absolute: 0.4 10*3/uL (ref 0.1–1.0)
Monocytes Relative: 5 %
Neutro Abs: 6.7 10*3/uL (ref 1.7–7.7)
Neutrophils Relative %: 86 %
Platelets: 409 10*3/uL — ABNORMAL HIGH (ref 150–400)
RBC: 3.94 MIL/uL (ref 3.87–5.11)
RDW: 13.6 % (ref 11.5–15.5)
WBC: 7.8 10*3/uL (ref 4.0–10.5)
nRBC: 0 % (ref 0.0–0.2)

## 2020-03-02 LAB — COMPREHENSIVE METABOLIC PANEL
ALT: 37 U/L (ref 0–44)
AST: 28 U/L (ref 15–41)
Albumin: 2.9 g/dL — ABNORMAL LOW (ref 3.5–5.0)
Alkaline Phosphatase: 92 U/L (ref 38–126)
Anion gap: 9 (ref 5–15)
BUN: 11 mg/dL (ref 6–20)
CO2: 42 mmol/L — ABNORMAL HIGH (ref 22–32)
Calcium: 9.2 mg/dL (ref 8.9–10.3)
Chloride: 88 mmol/L — ABNORMAL LOW (ref 98–111)
Creatinine, Ser: 0.58 mg/dL (ref 0.44–1.00)
GFR, Estimated: >60 mL/min (ref >60)
Glucose, Bld: 93 mg/dL (ref 70–99)
Potassium: 4.9 mmol/L (ref 3.5–5.1)
Sodium: 139 mmol/L (ref 135–145)
Total Bilirubin: 0.4 mg/dL (ref 0.3–1.2)
Total Protein: 8.1 g/dL (ref 6.5–8.1)

## 2020-03-02 LAB — GLUCOSE, CAPILLARY
Glucose-Capillary: 189 mg/dL — ABNORMAL HIGH (ref 70–99)
Glucose-Capillary: 212 mg/dL — ABNORMAL HIGH (ref 70–99)
Glucose-Capillary: 85 mg/dL (ref 70–99)
Glucose-Capillary: 163 mg/dL — ABNORMAL HIGH (ref 70–99)

## 2020-03-02 MED ORDER — ORAL CARE MOUTH RINSE
15.0000 mL | Freq: Two times a day (BID) | OROMUCOSAL | Status: DC
  Administered 2020-03-03 – 2020-03-21 (×33): 15 mL via OROMUCOSAL

## 2020-03-02 MED ORDER — ARFORMOTEROL TARTRATE 15 MCG/2ML IN NEBU
15.0000 ug | INHALATION_SOLUTION | Freq: Two times a day (BID) | RESPIRATORY_TRACT | Status: DC
  Administered 2020-03-02 – 2020-03-21 (×36): 15 ug via RESPIRATORY_TRACT
  Filled 2020-03-02 (×37): qty 2

## 2020-03-02 MED ORDER — CHLORHEXIDINE GLUCONATE 0.12 % MT SOLN
15.0000 mL | Freq: Two times a day (BID) | OROMUCOSAL | Status: DC
  Administered 2020-03-02 – 2020-03-21 (×36): 15 mL via OROMUCOSAL
  Filled 2020-03-02 (×38): qty 15

## 2020-03-02 MED ORDER — CHLORHEXIDINE GLUCONATE CLOTH 2 % EX PADS
6.0000 | MEDICATED_PAD | Freq: Every day | CUTANEOUS | Status: DC
  Administered 2020-03-02 – 2020-03-20 (×7): 6 via TOPICAL

## 2020-03-02 MED ORDER — ALBUTEROL SULFATE (2.5 MG/3ML) 0.083% IN NEBU
2.5000 mg | INHALATION_SOLUTION | Freq: Four times a day (QID) | RESPIRATORY_TRACT | Status: DC
  Administered 2020-03-02 – 2020-03-08 (×18): 2.5 mg via RESPIRATORY_TRACT
  Filled 2020-03-02 (×17): qty 3

## 2020-03-02 MED ORDER — BUDESONIDE 0.5 MG/2ML IN SUSP
0.5000 mg | Freq: Two times a day (BID) | RESPIRATORY_TRACT | Status: DC
  Administered 2020-03-02 – 2020-03-21 (×36): 0.5 mg via RESPIRATORY_TRACT
  Filled 2020-03-02 (×36): qty 2

## 2020-03-02 MED ORDER — METHYLPREDNISOLONE SODIUM SUCC 125 MG IJ SOLR
60.0000 mg | Freq: Two times a day (BID) | INTRAMUSCULAR | Status: DC
  Administered 2020-03-02 – 2020-03-07 (×9): 60 mg via INTRAVENOUS
  Filled 2020-03-02 (×9): qty 2

## 2020-03-02 MED ORDER — DEXMEDETOMIDINE HCL IN NACL 200 MCG/50ML IV SOLN
0.4000 ug/kg/h | INTRAVENOUS | Status: DC
  Administered 2020-03-02 (×2): 0.4 ug/kg/h via INTRAVENOUS
  Administered 2020-03-03: 0.5 ug/kg/h via INTRAVENOUS
  Administered 2020-03-03: 0.4 ug/kg/h via INTRAVENOUS
  Filled 2020-03-02 (×4): qty 50

## 2020-03-02 NOTE — Plan of Care (Addendum)
Reassessed at bedside, RR remains mid 30s, saturating 96% on 70% FiO2. Can follow commands in all extremities, still somewhat sleepy.  Asking to take mask off to drink water.  Will plan to transfer to ICU for closer monitoring. Follow up ABG in 30 min.  Steffanie Dunn, DO 03/02/20 3:20 PM [facility]   Patient seen in the ICU- still awake, RR ~40, FiO2 decreased to 60% after ABG drawn. Husband updated at bedside. Did not want to talk about the possibility of intubation and hopes to avoid it, but we have previously discussed this being our concern if she had progressive respiratory failure. Stopping dulera and starting long-acting ICS and LABA. Repeat ABG with PCO2 122> additional bipap adjustments made to maximize Vt (had dropped from 400-500 to about 300cc before coming to ICU).  Steffanie Dunn, DO 03/02/20 5:02 PM [facility]

## 2020-03-02 NOTE — Progress Notes (Signed)
NAME:  Christina Vincent, MRN:  [id number], DOB:  07-15-1960, LOS: 26 ADMISSION DATE:  02/04/2020, CONSULTATION DATE:  1/20 REFERRING MD:  Jarvis Newcomer, CHIEF COMPLAINT:  Refractory hypoxia in setting of COVID   Brief History:  60 year old female admitted on 12/27 with positive COVID.  Has been treated with full scope of available therapies including: High flow supplemental oxygen, systemic steroids, remdesivir, and baricitinib.  In spite of this continues to have fairly high supplemental oxygen needs and diffuse pulmonary infiltrates on chest x-ray.  Pulmonary asked to evaluate and give further recommendations if able.  History of Present Illness:   This is a 60 year old female patient who was initially admitted on 12/27 with chief complaint of fever and shortness of breath, found to have positive COVID PCR, with mild hypoxia.  Initial chest x-ray was essentially clear, CRP was 16.6, she was treated with full scope of therapy including systemic steroids for hypoxia, remdesivir, 14 days of baricitinib, and supplemental oxygen. Her course has been since complicated by progressive and now persistent respiratory failure requiring ongoing heated high flow oxygen, acute bilateral pulmonary emboli without evidence of right heart strain. In spite of all aggressive therapies she continues to require high supplemental oxygen dosing, patient's been fairly anxious in regards to her lack of progress.  Pulmonary asked to evaluate to determine if there were any other potential therapies and to discuss with patient concern about possible fibrotic related injuries at this point.  Past Medical History:  Type 2 diabetes, hypertension, obesity, hyperlipidemia, depression  Significant Hospital Events:  12/28-->1/20. admitted on 12/27 with chief complaint of fever and shortness of breath, found to have positive COVID PCR, with mild hypoxia.  Initial chest x-ray was essentially clear, CRP was 16.6, she was treated with full scope  of therapy including systemic steroids for hypoxia, remdesivir, 14 days of baricitinib, and supplemental oxygen. Her course has been since complicated by progressive and now persistent respiratory failure requiring ongoing heated high flow oxygen,  Consults:  pulm 1/20 Psych 1/19 Procedures:   Significant Diagnostic Tests:  CT angiogram 1/4: Acute bilateral lower lobe and right middle lobe segmental pulmonary emboli. No evidence of right heart strain. Multifocal pneumonia with worsening of aeration since 02/07/2020.  Micro Data:  covid + 02/04/20  Antimicrobials:   Interim History / Subjective:  Confused today, ABG with hypercapnia. Called to reassess. Patient endorses SOB. Feels better on bipap.  Objective   Blood pressure 116/66, pulse (!) 109, temperature 99 F (37.2 C), temperature source Oral, resp. rate (!) 26, height 5\' 8"  (1.727 m), weight 98.9 kg, SpO2 100 %.        Intake/Output Summary (Last 24 hours) at 03/02/2020 1421 Last data filed at 03/02/2020 0421 Gross per 24 hour  Intake 470 ml  Output 600 ml  Net -130 ml   Filed Weights   02/04/20 1838  Weight: 98.9 kg    Examination: General: middle aged woman laying in bed in NAD HENT: [place]/AT, eyes anicteric Lungs: Tachypneic, truncated speech. CTAB. Cardiovascular: tachycardic, reg rhythm Abdomen:  Soft, ND Extremities: warm, dry  Neuro: confused, able to track with her eyes and trying to answer short questions and asking questions. Moving all extremities but globally weak. Derm: warm, dry  Resolved Hospital Problem list    Assessment & Plan:   Acute hypoxic and hypercapnic respiratory failure due to covid 19 viral pneumonia. 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 16/8, 70%. Short-term assessment for improvement. My concern is at this point in the  course, she is likely to be hard to go on and off BiPAP due to work of breathing issues. She is currently high risk for progressing to intubation. As I have previously discussed with her, people who decompensate late in their course and end up on the ventilator often have irreversible lung damage and unfortunately often do not survive.  -Has had all available treatment medications (steroids, remdesivir, baricitinib) w/out improvement  -Con't AC for PRs; 3 months required for provoked DVT -Con't long-acting and short-acting bronchodilators for likely asthma provoked by covid. -CXR today -follow up ABG on BiPAP  Acute metabolic encephalopathy due to hypercapnia -monitor closely on BiPAP; currently communicating effectively enough that she should be safe to use  Discussed plan at bedside with RT and Dr. Jarvis Newcomer.   Best practice (evaluated daily)  Per primary  CBC: Recent Labs  Lab 02/25/20 0438 02/27/20 0445  WBC 12.7* 12.7*  HGB 11.2* 11.4*  HCT 36.3 37.3  MCV 92.6 93.5  PLT 249 333    Basic Metabolic Panel: Recent Labs  Lab 02/25/20 0438 02/27/20 0445  NA 137 141  K 4.5 4.6  CL 93* 96*  CO2 30 32  GLUCOSE 136* 147*  BUN 15 18  CREATININE 0.55 0.40*  CALCIUM 8.9 9.1   GFR: Estimated Creatinine Clearance: 95.4 mL/min (A) (by C-G formula based on SCr of 0.4 mg/dL (L)). Recent Labs  Lab 02/25/20 0438 02/27/20 0445  WBC 12.7* 12.7*    Liver Function Tests: Recent Labs  Lab 02/25/20 0438  AST 32  ALT 60*  ALKPHOS 104  BILITOT 0.7  PROT 7.6  ALBUMIN 2.5*   No results for input(s): LIPASE, AMYLASE in the last 168 hours. No results for input(s): AMMONIA in the last 168 hours.  ABG    Component Value Date/Time   PHART 7.218 (L) 03/02/2020 1318   PCO2ART 119 (HH) 03/02/2020 1318   PO2ART 169 (H) 03/02/2020 1318   HCO3 46.7 (H) 03/02/2020 1318   O2SAT 98.6 03/02/2020 1318     Coagulation Profile: No results for input(s): INR, PROTIME in the last 168  hours.  Cardiac Enzymes: No results for input(s): CKTOTAL, CKMB, CKMBINDEX, TROPONINI in the last 168 hours.  HbA1C: Hgb A1c MFr Bld  Date/Time Value Ref Range Status  02/14/2020 05:39 AM 7.7 (H) 4.8 - 5.6 % Final    Comment:    (NOTE) Pre diabetes:          5.7%-6.4%  Diabetes:              >6.4%  Glycemic control for   <7.0% adults with diabetes     CBG: Recent Labs  Lab 03/01/20 1227 03/01/20 1603 03/01/20 2025 03/02/20 0750 03/02/20 1136  GLUCAP 125* 149* 201* 189* 212*    This patient is critically ill with multiple organ system failure which requires frequent high complexity decision making, assessment, support, evaluation, and titration of therapies. This was completed through the application of advanced monitoring technologies and extensive interpretation of multiple databases. During this encounter critical care time was devoted to patient care services described in this note for 35 minutes.  Steffanie Dunn, DO 03/02/20 3:03 PM [facility]

## 2020-03-02 NOTE — Progress Notes (Addendum)
PROGRESS NOTE  Christina Vincent  KGM:[id number] DOB: [birth year] DOA: 02/04/2020 PCP: Avon Gully, MD   Brief Narrative: Christina Vincent is a 60 y.o. female with a history of T2DM, HTN, HLD, obesity and depression who presented with fever and dyspnea found to have positive SARS-CoV-2 PCR and mild hypoxia despite clear CXR on 12/27. CRP 16.6, PCT negative. Full scope treatment commenced with subsequent worsening of hypoxia. CTA chest 1/4 revealed worsening airspace disease compatible with progressive covid pneumonia as well as acute bilateral pulmonary emboli without right heart strain. Severe hypoxemia continues. Isolation period has been completed while still hospitalized. CXR repeated 1/13, 1/18 and 1/20 have continued to demonstrate significant heterogenous opacities bilaterally. Psychiatry was consulted for medical management of concomitant anxiety. PCCM consulted for further recommendations 02/28/2020. On 1/23 she became lethargic and was noted to be severely hypercarbic.   Assessment & Plan: Principal Problem:   19-May-2017 novel coronavirus disease (COVID-19) Active Problems:   Hypertension   Type 2 diabetes mellitus (HCC)   Dyslipidemia   Depression   Class 1 obesity   Acute hypoxemic respiratory failure due to COVID-19 [facility])   Pulmonary emboli (HCC)  Acute hypoxemic respiratory failure due to covid-19 pneumonia and PE: SARS-CoV-2 positive on 02/04/2020.  - Maintain adequate oxygenation, wean oxygen as able.  - Continue efforts at therapy, OOB, IS, FV, and proning.  - Completed remdesivir - Completed steroid with taper and baricitinib x14 days (02/19/2020). - PCCM consulted 02/28/2020, recommended criteria for discharge is 6L O2 requirement with hopes of weaning further at home and considering transplant if otherwise doing well at time of pulmonary follow up.  Respiratory acidosis: Acute hypercarbia today confirmed by ABG with respiratory acidosis. No increase in anxiolytics or additional  sedating medications recently added.  - Avoid sedating medications for now - D/w PCCM, Dr. Chestine Spore, who will evaluate the patient - STAT BiPAP ordered. Pt not obtunded, is currently protecting airway. - Check labs for r/o renal failure, hepatic failure.   Segmental PE: No right heart strain. - Continue lovenox 1mg /kg q12h.   T2DM with steroid-induced hyperglycemia: HbA1c 7.7%.  - Continue current management, improved with steroid off.  Anxiety, insomnia, hypnagogic hallucinations: Exacerbated by mother's death from covid 20-May-2018.  - Psychiatry consulted, added seroquel BID. We will stop this for now. - We changed back to xanax 0.5mg  po TID prn. Hold for sedation. - Remeron and trazodone qHS.  - Outpatient support will be needed.  - Chaplain consulted.   Abnormal TSH: Free T4 and T3 wnl.  - Recheck after convalescence  HTN:  - Continue losartan  LFT elevation: Mild and stable. Continue intermittent monitoring.  Mouth pain: Resolved.  - prn magic mouthwash.   Stress > urge urinary incontinence:  - Pt declines initiation of medication, will continue lifestyle modifications.  Obesity: Estimated body mass index is 33.15 kg/m as calculated from the following:   Height as of this encounter: 5\' 8"  (1.727 m).   Weight as of this encounter: 98.9 kg.  DVT prophylaxis: Lovenox Code Status: Full Family Communication: Husband by phone  Disposition Plan:  Status is: Inpatient  Remains inpatient appropriate because:Inpatient level of care appropriate due to severity of illness  Dispo:  Patient From: Home  Planned Disposition: Home with Health Care Svc    Expected discharge date: >3 days  Medically stable for discharge: No  Consultants:   PCCM  Psychiatry  Procedures:   None  Antimicrobials:  Remdesivir   Subjective: Pt confused, lethargic, doesn't remember me. RN  had turned off oxygen to NRB with sats staying in the 90%'s on her last check, though SpO2 at time of my  evaluation was in low 80%'s confirmed with 2 oximeters. Increasing O2 to 15L by HFNC and 15L by NRB brought SpO2 up to 100%. No change in mentation.  Objective: Vitals:   03/01/20 2023 03/01/20 2346 03/02/20 0421 03/02/20 0927  BP: 107/63 112/60 110/61   Pulse: (!) 111 (!) 112 (!) 118 (!) 102  Resp: (!) 26 (!) 28 (!) 32 (!) 24  Temp: 98.5 F (36.9 C) 98.9 F (37.2 C) 98.4 F (36.9 C)   TempSrc: Oral Oral Oral   SpO2: 98% 95% 97% 95%  Weight:      Height:        Intake/Output Summary (Last 24 hours) at 03/02/2020 1250 Last data filed at 03/02/2020 0421 Gross per 24 hour  Intake 570 ml  Output 900 ml  Net -330 ml   Filed Weights   02/04/20 1838  Weight: 98.9 kg   Gen: Lethargic female Pulm: Shallow tachypnea w/crackles and diminished breath sounds CV: Regular tachycardia at recent baseline. No murmur, rub, or gallop. No JVD, no dependent edema. GI: Abdomen soft, non-tender, non-distended, with normoactive bowel sounds.  Ext: Warm, no deformities Skin: No rashes, lesions or ulcers on visualized skin. Neuro: Lethargic but rousable without ability to cooperative for full exam. Psych: UTD  Data Reviewed: I have personally reviewed following labs and imaging studies  CBC: Recent Labs  Lab 02/25/20 0438 02/27/20 0445  WBC 12.7* 12.7*  HGB 11.2* 11.4*  HCT 36.3 37.3  MCV 92.6 93.5  PLT 249 333   Basic Metabolic Panel: Recent Labs  Lab 02/25/20 0438 02/27/20 0445  NA 137 141  K 4.5 4.6  CL 93* 96*  CO2 30 32  GLUCOSE 136* 147*  BUN 15 18  CREATININE 0.55 0.40*  CALCIUM 8.9 9.1   GFR: Estimated Creatinine Clearance: 95.4 mL/min (A) (by C-G formula based on SCr of 0.4 mg/dL (L)). Liver Function Tests: Recent Labs  Lab 02/25/20 0438  AST 32  ALT 60*  ALKPHOS 104  BILITOT 0.7  PROT 7.6  ALBUMIN 2.5*    Scheduled Meds: . albuterol  2 puff Inhalation Q6H  . vitamin C  500 mg Oral Daily  . benzonatate  200 mg Oral BID  . chlorpheniramine-HYDROcodone  5  mL Oral QHS  . enoxaparin (LOVENOX) injection  100 mg Subcutaneous BID  . insulin aspart  0-20 Units Subcutaneous TID WC  . mirtazapine  15 mg Oral QHS  . mometasone-formoterol  2 puff Inhalation BID  . QUEtiapine  12.5 mg Oral BID  . traZODone  50 mg Oral QHS  . zinc sulfate  220 mg Oral Daily   Continuous Infusions:   LOS: 26 days   Time spent: 35 minutes.  Tyrone Nine, MD [facility] [email or web address] 03/02/2020, 12:50 PM

## 2020-03-02 NOTE — Progress Notes (Signed)
RT NOTE:  Pt placed on BiPAP per MD order. Pt put on settings IPAP 16, EPAP 8, 70% FiO2, and a set rate of 20 by CCMD who was in room at time of BiPAP placement. Pt RR 41, HR 113, O2 sats 91%, MD aware. Pt demonstrated understanding of the importance of keeping the BiPAP mask on at this time. RT will continue to monitor pt status closely.

## 2020-03-02 NOTE — Plan of Care (Signed)
  Problem: Clinical Measurements: Goal: Diagnostic test results will improve Outcome: Progressing   

## 2020-03-03 ENCOUNTER — Encounter (HOSPITAL_COMMUNITY): Payer: Self-pay | Admitting: Internal Medicine

## 2020-03-03 LAB — CBC WITH DIFFERENTIAL/PLATELET
Abs Immature Granulocytes: 0.04 10*3/uL (ref 0.00–0.07)
Basophils Absolute: 0 10*3/uL (ref 0.0–0.1)
Basophils Relative: 0 %
Eosinophils Absolute: 0 10*3/uL (ref 0.0–0.5)
Eosinophils Relative: 0 %
HCT: 34.2 % — ABNORMAL LOW (ref 36.0–46.0)
Hemoglobin: 10.2 g/dL — ABNORMAL LOW (ref 12.0–15.0)
Immature Granulocytes: 1 %
Lymphocytes Relative: 6 %
Lymphs Abs: 0.5 10*3/uL — ABNORMAL LOW (ref 0.7–4.0)
MCH: 28.5 pg (ref 26.0–34.0)
MCHC: 29.8 g/dL — ABNORMAL LOW (ref 30.0–36.0)
MCV: 95.5 fL (ref 80.0–100.0)
Monocytes Absolute: 0.3 10*3/uL (ref 0.1–1.0)
Monocytes Relative: 4 %
Neutro Abs: 7.3 10*3/uL (ref 1.7–7.7)
Neutrophils Relative %: 89 %
Platelets: 428 10*3/uL — ABNORMAL HIGH (ref 150–400)
RBC: 3.58 MIL/uL — ABNORMAL LOW (ref 3.87–5.11)
RDW: 14.2 % (ref 11.5–15.5)
WBC: 8.1 10*3/uL (ref 4.0–10.5)
nRBC: 0 % (ref 0.0–0.2)

## 2020-03-03 LAB — BASIC METABOLIC PANEL
Anion gap: 12 (ref 5–15)
BUN: 20 mg/dL (ref 6–20)
CO2: 40 mmol/L — ABNORMAL HIGH (ref 22–32)
Calcium: 9 mg/dL (ref 8.9–10.3)
Chloride: 88 mmol/L — ABNORMAL LOW (ref 98–111)
Creatinine, Ser: 0.51 mg/dL (ref 0.44–1.00)
GFR, Estimated: >60 mL/min (ref >60)
Glucose, Bld: 186 mg/dL — ABNORMAL HIGH (ref 70–99)
Potassium: 5 mmol/L (ref 3.5–5.1)
Sodium: 140 mmol/L (ref 135–145)

## 2020-03-03 LAB — GLUCOSE, CAPILLARY
Glucose-Capillary: 175 mg/dL — ABNORMAL HIGH (ref 70–99)
Glucose-Capillary: 229 mg/dL — ABNORMAL HIGH (ref 70–99)
Glucose-Capillary: 196 mg/dL — ABNORMAL HIGH (ref 70–99)
Glucose-Capillary: 191 mg/dL — ABNORMAL HIGH (ref 70–99)

## 2020-03-03 LAB — BLOOD GAS, ARTERIAL
Acid-Base Excess: 18.6 mmol/L — ABNORMAL HIGH (ref 0.0–2.0)
Bicarbonate: 46.3 mmol/L — ABNORMAL HIGH (ref 20.0–28.0)
Delivery systems: POSITIVE
Drawn by: 308601
FIO2: 50
Mode: POSITIVE
O2 Saturation: 91.4 %
Patient temperature: 98
pCO2 arterial: 74.6 mmHg (ref 32.0–48.0)
pH, Arterial: 7.408 (ref 7.350–7.450)
pO2, Arterial: 57.8 mmHg — ABNORMAL LOW (ref 83.0–108.0)

## 2020-03-03 LAB — MRSA PCR SCREENING: MRSA by PCR: NEGATIVE

## 2020-03-03 LAB — C-REACTIVE PROTEIN: CRP: 2.9 mg/dL — ABNORMAL HIGH (ref <1.0)

## 2020-03-03 LAB — PROCALCITONIN: Procalcitonin: 0.1 ng/mL

## 2020-03-03 MED ORDER — FUROSEMIDE 10 MG/ML IJ SOLN
60.0000 mg | Freq: Once | INTRAMUSCULAR | Status: AC
  Administered 2020-03-03: 60 mg via INTRAVENOUS
  Filled 2020-03-03 (×2): qty 6

## 2020-03-03 MED ORDER — LORAZEPAM 2 MG/ML IJ SOLN
1.0000 mg | INTRAMUSCULAR | Status: DC | PRN
  Administered 2020-03-03 – 2020-03-09 (×3): 1 mg via INTRAVENOUS
  Filled 2020-03-03 (×3): qty 1

## 2020-03-03 NOTE — Progress Notes (Signed)
Chaplain engaged in initial visit with Christina Vincent offering support.  Christina Vincent noted that she was not feeling too well and that she wanted her husband.  Chaplain let her know that she was also there to support her and would continue to check in with her.  Chaplain said a blessing over Christina Vincent to be able to rest.  Chaplain will follow-up.  Christina Vincent requested chaplain get nurse to contact her husband.

## 2020-03-03 NOTE — Progress Notes (Signed)
PHYSICAL THERAPY  PT Cancellation Note  Patient Details Name: DELVA DERDEN MRN: [id number] DOB: October 23, 1960   Cancelled Treatment:     RN stated best to come back tomorrow  "pt about to go back on BiPap"   Felecia Shelling  PTA Acute  Rehabilitation Services Pager      [phone] Office      [phone]

## 2020-03-03 NOTE — Progress Notes (Signed)
NAME:  Christina Vincent, MRN:  [id number], DOB:  1960-11-14, LOS: 27 ADMISSION DATE:  02/04/2020, CONSULTATION DATE:  1/20 REFERRING MD:  Jarvis Newcomer, CHIEF COMPLAINT:  Refractory hypoxia in setting of COVID   Brief History:    This is a 60 year old female patient who was initially admitted on 12/27 with chief complaint of fever and shortness of breath, found to have positive COVID PCR, with mild hypoxia.  Initial chest x-ray was essentially clear, CRP was 16.6, she was treated with full scope of therapy including systemic steroids for hypoxia, remdesivir, 14 days of baricitinib, and supplemental oxygen. Her course has been since complicated by progressive and now persistent respiratory failure requiring ongoing heated high flow oxygen, acute bilateral pulmonary emboli 02/12/20 without evidence of right heart strain. In spite of all aggressive therapies she continues to require high supplemental oxygen dosing, patient's been fairly anxious in regards to her lack of progress.  Pulmonary asked to evaluate to determine if there were any other potential therapies and to discuss with patient concern about possible fibrotic related injuries at this point.  Past Medical History:  Type 2 diabetes, hypertension, obesity, hyperlipidemia, depression  Significant Hospital Events:  12/28-->1/20. admitted on 12/27 with chief complaint of fever and shortness of breath, found to have positive COVID PCR, with mild hypoxia.  Initial chest x-ray was essentially clear, CRP was 16.6, she was treated with full scope of therapy including systemic steroids for hypoxia, remdesivir, 14 days of baricitinib, and supplemental oxygen. Her course has been since complicated by progressive and now persistent respiratory failure requiring ongoing heated high flow oxygen,  03/02/20 -: Confused today, ABG with hypercapnia. Called to reassess. Patient endorses SOB. Feels better on bipap.  Consults:  pulm 1/20 Psych 1/19 Procedures:    Significant Diagnostic Tests:  CT angiogram 1/4: Acute bilateral lower lobe and right middle lobe segmental pulmonary emboli. No evidence of right heart strain. Multifocal pneumonia with worsening of aeration since 02/07/2020.  Micro Data:  covid + 02/04/20  Antimicrobials:   Interim History / Subjective:    03/03/2020 = on 50% bipap. Alert and not confused. RR 28. Not paradoxical. RN/RT plan for trial off bipap this AM. RN feels she is improved  Objective   Blood pressure 125/60, pulse 96, temperature (!) 97.5 F (36.4 C), temperature source Axillary, resp. rate (!) 39, height 5\' 8"  (1.727 m), weight 98.9 kg, SpO2 91 %.    FiO2 (%):  [50 %-55 %] 50 %   Intake/Output Summary (Last 24 hours) at 03/03/2020 03/05/2020 Last data filed at 03/03/2020 0600 Gross per 24 hour  Intake 237.29 ml  Output 300 ml  Net -62.71 ml   Filed Weights   02/04/20 1838  Weight: 98.9 kg    Examination:     Resolved Hospital Problem list    Assessment & Plan:   Acute hypoxic and hypercapnic respiratory failure due to covid 19 viral pneumonia. 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 Not paradoxical. Still some wheeze  Plan  = get echo  - lasix x 1 and monitor closely - BiPAP - qhs + day 2h on with 2-4h off and allow to eat - intubate if worse - BD  Acute metabolic encephalopathy due to hypercapnia  03/03/2020 - improved/resolved after bipap improvements in hypercapnia  Plan  - monitor  ATTESTATION & SIGNATURE   The patient Christina PURYEAR is critically ill with multiple organ systems failure and requires high complexity decision making for assessment and support, frequent evaluation and titration of therapies, application of advanced monitoring technologies and extensive interpretation of  multiple databases.   Critical Care Time devoted to patient care services described in this note is  35  Minutes. This time reflects time of care of this signee Dr Kalman Shan. This critical care time does not reflect procedure time, or teaching time or supervisory time of PA/NP/Med student/Med Resident etc but could involve care discussion time     Dr. Kalman Shan, M.D., [facility].C.P Pulmonary and Critical Care Medicine Staff Physician [facility] Pager: [phone], If no answer or between  15:00h - 7:00h: call [phone]  03/03/2020 9:04 AM    LABS    PULMONARY Recent Labs  Lab 03/02/20 1318 03/02/20 1634 03/03/20 0150  PHART 7.218* 7.222* 7.408  PCO2ART 119* 122* 74.6*  PO2ART 169* 174* 57.8*  HCO3 46.7* 48.3* 46.3*  O2SAT 98.6 98.6 91.4    CBC Recent Labs  Lab 02/27/20 0445 03/02/20 1703  HGB 11.4* 11.2*  HCT 37.3 38.6  WBC 12.7* 7.8  PLT 333 409*    COAGULATION No results for input(s): INR in the last 168 hours.  CARDIAC  No results for input(s): TROPONINI in the last 168 hours. No results for input(s): PROBNP in the last 168 hours.   CHEMISTRY Recent Labs  Lab 02/27/20 0445 03/02/20 1703  NA 141 139  K 4.6 4.9  CL 96* 88*  CO2 32 42*  GLUCOSE 147* 93  BUN 18 11  CREATININE 0.40* 0.58  CALCIUM 9.1 9.2   Estimated Creatinine Clearance: 95.4 mL/min (by C-G formula based on SCr of 0.58 mg/dL).   LIVER Recent Labs  Lab 03/02/20 1703  AST 28  ALT 37  ALKPHOS 92  BILITOT 0.4  PROT 8.1  ALBUMIN 2.9*     INFECTIOUS No results for input(s): LATICACIDVEN, PROCALCITON in the last 168 hours.   ENDOCRINE CBG (last 3)  Recent Labs    03/02/20 1701 03/02/20 2129 03/03/20 0758  GLUCAP 85 163* 175*         IMAGING x48h  - image(s) personally visualized  -   highlighted in bold DG CHEST PORT 1 VIEW  Result Date: 03/02/2020 CLINICAL DATA:  60 year old female with tachypnea  and acute respiratory failure. EXAM: PORTABLE CHEST 1 VIEW COMPARISON:  Chest radiograph dated 02/28/2020. FINDINGS: Interval worsening of bilateral pulmonary infiltrates compared to prior radiograph. No large pleural effusion no pneumothorax. Stable cardiac silhouette. No acute osseous pathology. IMPRESSION: Interval worsening of the bilateral pulmonary infiltrates compared to prior radiograph. Electronically Signed   By: Elgie Collard M.D.   On: 03/02/2020 17:03

## 2020-03-03 NOTE — Progress Notes (Signed)
PROGRESS NOTE  Christina Vincent  LFY:[id number] DOB: [birth year] DOA: 02/04/2020 PCP: Avon Gully, MD   Brief Narrative: Christina Vincent is a 60 y.o. female with a history of T2DM, HTN, HLD, obesity and depression who presented with fever and dyspnea found to have positive SARS-CoV-2 PCR and mild hypoxia despite clear CXR on 12/27. CRP 16.6, PCT negative. Full scope treatment commenced with subsequent worsening of hypoxia. CTA chest 1/4 revealed worsening airspace disease compatible with progressive covid pneumonia as well as acute bilateral pulmonary emboli without right heart strain. Severe hypoxemia continues. Isolation period has been completed while still hospitalized. CXR repeated 1/13, 1/18 and 1/20 have continued to demonstrate significant heterogenous opacities bilaterally. Psychiatry was consulted for medical management of concomitant anxiety. PCCM consulted for further recommendations 02/28/2020. On 1/23 she became lethargic and was noted to be severely hypercarbic, placed on BiPAP and transferred to ICU. Plan to continue BiPAP and goals of care discussions.  Assessment & Plan: Principal Problem:   06/07/2017 novel coronavirus disease (COVID-19) Active Problems:   Hypertension   Type 2 diabetes mellitus (HCC)   Dyslipidemia   Depression   Class 1 obesity   Acute hypoxemic respiratory failure due to COVID-19 [facility])   Pulmonary emboli (HCC)  Acute hypoxemic and hypercarbic respiratory failure due to covid-19 pneumonia and PE: SARS-CoV-2 positive on 02/04/2020, no longer on isolation s/p full courses of remdesivir, baricitinib and steroids.  - Now restart steroids, CRP modestly elevated, PCT negative. - Empiric lasix to keep net negative - PCCM following, recommending continued intermittent BiPAP. Mentation improved this AM, can trial off BiPAP > HFNC.   Acute hypercarbic encephalopathy: 1/23, improved with BiPAP on most recent ABG.  - Monitor closely, avoid oversedation.  Segmental PE: No  right heart strain. - Continue lovenox 1mg /kg q12h.   T2DM with steroid-induced hyperglycemia: HbA1c 7.7%.  - Continue current management, improved with steroid off.  Anxiety, insomnia, hypnagogic hallucinations: Exacerbated by mother's death from covid 2018-06-08.  - Started on precedex gtt overnight, will wean.  - Psychiatry consulted, added seroquel BID. We will stop this for now. - Xanax 0.5mg  po TID prn. Hold for sedation. - Outpatient support will be needed.  - Chaplain consulted.   Abnormal TSH: Free T4 and T3 wnl.  - Recheck after convalescence  HTN:  - Continue losartan  LFT elevation: Mild and stable. Continue intermittent monitoring.  Mouth pain: Resolved.  - prn magic mouthwash.   Stress > urge urinary incontinence:  - Pt declines initiation of medication, will continue lifestyle modifications.  Obesity: Estimated body mass index is 33.15 kg/m as calculated from the following:   Height as of this encounter: 5\' 8"  (1.727 m).   Weight as of this encounter: 98.9 kg.  DVT prophylaxis: Lovenox Code Status: Full Family Communication: Husband at bedside and by phone several times 1/23. Disposition Plan:  Status is: Inpatient  Remains inpatient appropriate because:Inpatient level of care appropriate due to severity of illness  Dispo:  Patient From: Home  Planned Disposition: Home with Health Care Svc    Expected discharge date: >3 days  Medically stable for discharge: No  Consultants:   PCCM  Psychiatry  Procedures:   None  Antimicrobials:  Remdesivir   Subjective: More alert this AM, wants to eat/drink. Feels she's better than yesterday. No chest pain, no bleeding. Anxious overnight requiring precedex which is improved. Amenable to taking off BiPAP.  Objective: Vitals:   03/03/20 0600 03/03/20 0700 03/03/20 0800 03/03/20 0924  BP: (!) 107/46 125/60  Pulse: 85 96    Resp: (!) 25 (!) 39    Temp:   (!) 97.5 F (36.4 C)   TempSrc:   Axillary    SpO2: (!) 88% 91%  95%  Weight:      Height:        Intake/Output Summary (Last 24 hours) at 03/03/2020 0936 Last data filed at 03/03/2020 0600 Gross per 24 hour  Intake 237.29 ml  Output 300 ml  Net -62.71 ml   Filed Weights   02/04/20 1838  Weight: 98.9 kg   Gen: 60 y.o. female in no acute distress on BiPAP Pulm: Coarse, tachypneic with BiPAP CV: Regular rate and rhythm. No murmur, rub, or gallop. No JVD, no dependent edema. GI: Abdomen soft, non-tender, non-distended, with normoactive bowel sounds.  Ext: Warm, no deformities Skin: No rashes, lesions or ulcers on visualized skin. Neuro: Alert and oriented. No focal neurological deficits. Psych: Judgement and insight appear fair. Mood anxious & affect congruent.  Data Reviewed: I have personally reviewed following labs and imaging studies  CBC: Recent Labs  Lab 02/27/20 0445 03/02/20 1703 03/03/20 0744  WBC 12.7* 7.8 8.1  NEUTROABS  --  6.7 7.3  HGB 11.4* 11.2* 10.2*  HCT 37.3 38.6 34.2*  MCV 93.5 98.0 95.5  PLT 333 409* 428*   Basic Metabolic Panel: Recent Labs  Lab 02/27/20 0445 03/02/20 1703 03/03/20 0744  NA 141 139 140  K 4.6 4.9 5.0  CL 96* 88* 88*  CO2 32 42* 40*  GLUCOSE 147* 93 186*  BUN 18 11 20   CREATININE 0.40* 0.58 0.51  CALCIUM 9.1 9.2 9.0   GFR: Estimated Creatinine Clearance: 95.4 mL/min (by C-G formula based on SCr of 0.51 mg/dL). Liver Function Tests: Recent Labs  Lab 03/02/20 1703  AST 28  ALT 37  ALKPHOS 92  BILITOT 0.4  PROT 8.1  ALBUMIN 2.9*    Scheduled Meds: . albuterol  2.5 mg Nebulization Q6H  . arformoterol  15 mcg Nebulization BID  . vitamin C  500 mg Oral Daily  . benzonatate  200 mg Oral BID  . budesonide  0.5 mg Nebulization BID  . chlorhexidine  15 mL Mouth Rinse BID  . Chlorhexidine Gluconate Cloth  6 each Topical Daily  . enoxaparin (LOVENOX) injection  100 mg Subcutaneous BID  . furosemide  60 mg Intravenous Once  . insulin aspart  0-20 Units  Subcutaneous TID WC  . mouth rinse  15 mL Mouth Rinse q12n4p  . methylPREDNISolone (SOLU-MEDROL) injection  60 mg Intravenous Q12H  . mirtazapine  15 mg Oral QHS  . traZODone  50 mg Oral QHS  . zinc sulfate  220 mg Oral Daily   Continuous Infusions: . dexmedetomidine (PRECEDEX) IV infusion 0.4 mcg/kg/hr (03/03/20 0900)     LOS: 27 days   Time spent: 35 minutes.  03/05/20, MD [facility] [email or web address] 03/03/2020, 9:36 AM

## 2020-03-03 NOTE — Progress Notes (Signed)
eLink Physician-Brief Progress Note Patient Name: Christina Vincent DOB: [birth year] MRN: [id number]   Date of Service  03/03/2020  HPI/Events of Note  ABG on BiPAP = 7.408/74.6/57.8/46.3 Sat = 91.4.  eICU Interventions  Continue present management.      Intervention Category Major Interventions: Respiratory failure - evaluation and management  Leona Pressly Eugene 03/03/2020, 2:26 AM

## 2020-03-03 NOTE — TOC Initial Note (Signed)
Transition of Care [facility]) - Initial/Assessment Note    Patient Details  Name: Christina Vincent MRN: [id number] Date of Birth: 1960-03-10  Transition of Care [facility]) CM/SW Contact:    Golda Acre, RN Phone Number: 03/03/2020, 10:21 AM  Clinical Narrative:                 60 y.o. female with a history of T2DM, HTN, HLD, obesity and depression who presented with fever and dyspnea found to have positive SARS-CoV-2 PCR and mild hypoxia despite clear CXR on 12/27. CRP 16.6, PCT negative. Full scope treatment commenced with subsequent worsening of hypoxia. CTA chest 1/4 revealed worsening airspace disease compatible with progressive covid pneumonia as well as acute bilateral pulmonary emboli without right heart strain. Severe hypoxemia continues. Isolation period has been completed while still hospitalized. CXR repeated 1/13, 1/18 and 1/20 have continued to demonstrate significant heterogenous opacities bilaterally. Psychiatry was consulted for medical management of concomitant anxiety. PCCM consulted for further recommendations 02/28/2020. On 1/23 she became lethargic and was noted to be severely hypercarbic, placed on BiPAP and transferred to ICU. Plan to continue BiPAP and goals of care discussions.  Assessment & Plan: Principal Problem:   2019 novel coronavirus disease (COVID-19) Active Problems:   Hypertension   Type 2 diabetes mellitus (HCC)   Dyslipidemia   Depression   Class 1 obesity   Acute hypoxemic respiratory failure due to COVID-19 [facility])   Pulmonary emboli (HCC)  Acute hypoxemic and hypercarbic respiratory failure due to covid-19 pneumonia and PE: SARS-CoV-2 positive on 02/04/2020, no longer on isolation s/p full courses of remdesivir, baricitinib and steroids.  - Now restart steroids, CRP modestly elevated, PCT negative. - Empiric lasix to keep net negative - PCCM following, recommending continued intermittent BiPAP. Mentation improved this AM, can trial off BiPAP > HFNC.   PLAN TO RETURN TO HOME WITH FAMILY, MAY NEED HOSPICE SERVICES FOLLOWING FOR PROGRESSION.  Expected Discharge Plan: Home/Self Care     Patient Goals and CMS Choice Patient states their goals for this hospitalization and ongoing recovery are:: TO GO HOME CMS Medicare.gov Compare Post Acute Care list provided to:: Patient    Expected Discharge Plan and Services Expected Discharge Plan: Home/Self Care   Discharge Planning Services: CM Consult   Living arrangements for the past 2 months: Single Family Home                                      Prior Living Arrangements/Services Living arrangements for the past 2 months: Single Family Home Lives with:: Spouse Patient language and need for interpreter reviewed:: Yes Do you feel safe going back to the place where you live?: Yes      Need for Family Participation in Patient Care: Yes (Comment) Care giver support system in place?: Yes (comment)   Criminal Activity/Legal Involvement Pertinent to Current Situation/Hospitalization: No - Comment as needed  Activities of Daily Living Home Assistive Devices/Equipment: Eyeglasses ADL Screening (condition at time of admission) Patient's cognitive ability adequate to safely complete daily activities?: Yes Is the patient deaf or have difficulty hearing?: No Does the patient have difficulty seeing, even when wearing glasses/contacts?: No Does the patient have difficulty concentrating, remembering, or making decisions?: No Patient able to express need for assistance with ADLs?: Yes Does the patient have difficulty dressing or bathing?: No Independently performs ADLs?: Yes (appropriate for developmental age) Does the patient have difficulty walking or  climbing stairs?: No Weakness of Legs: None Weakness of Arms/Hands: None  Permission Sought/Granted                  Emotional Assessment Appearance:: Appears stated age Attitude/Demeanor/Rapport: Engaged Affect (typically  observed): Calm Orientation: : Fluctuating Orientation (Suspected and/or reported Sundowners) Alcohol / Substance Use: Not Applicable Psych Involvement: No (comment)  Admission diagnosis:  Acute hypoxemic respiratory failure due to COVID-19 (HCC) [U07.1, J96.01] 2019 novel coronavirus disease (COVID-19) [U07.1] Patient Active Problem List   Diagnosis Date Noted  . Pulmonary emboli (HCC) 02/23/2020  . Acute hypoxemic respiratory failure due to COVID-19 (HCC) 02/05/2020  . 2019 novel coronavirus disease (COVID-19) 02/04/2020  . Hypertension   . Type 2 diabetes mellitus (HCC)   . Dyslipidemia   . Depression   . Class 1 obesity   . Rectal burning 12/28/2019  . Rectal bleeding 12/28/2019  . Colon cancer screening 12/28/2019   PCP:  Avon Gully, MD Pharmacy:   [facility] [address] [place] - [address] [place] [address] [place] [address] [place] [address] Phone: [phone] Fax: [phone]     Social Determinants of Health (SDOH) Interventions    Readmission Risk Interventions No flowsheet data found.

## 2020-03-03 NOTE — Progress Notes (Signed)
eLink Physician-Brief Progress Note Patient Name: Christina Vincent DOB: Mar 15, 1960 MRN: [id number]   Date of Service  03/03/2020  HPI/Events of Note  Last pH/pCO@ = 7.222/122. Now on BiPAP.   eICU Interventions  Repeat ABG now.      Intervention Category Major Interventions: Acid-Base disturbance - evaluation and management;Other:  [name] Dennard Nip 03/03/2020, 1:33 AM

## 2020-03-04 ENCOUNTER — Inpatient Hospital Stay (HOSPITAL_COMMUNITY): Payer: 59

## 2020-03-04 DIAGNOSIS — R0603 Acute respiratory distress: Secondary | ICD-10-CM

## 2020-03-04 LAB — ECHOCARDIOGRAM COMPLETE
Area-P 1/2: 2.43 cm2
Height: 68 in
S' Lateral: 2.2 cm
Weight: 3488 oz

## 2020-03-04 LAB — PHOSPHORUS: Phosphorus: 4.6 mg/dL (ref 2.5–4.6)

## 2020-03-04 LAB — BLOOD GAS, ARTERIAL
Acid-Base Excess: 21 mmol/L — ABNORMAL HIGH (ref 0.0–2.0)
Bicarbonate: 50.2 mmol/L — ABNORMAL HIGH (ref 20.0–28.0)
Delivery systems: POSITIVE
Expiratory PAP: 9
FIO2: 60
Inspiratory PAP: 18
O2 Saturation: 88.5 %
Patient temperature: 98.6
RATE: 22 resp/min
pCO2 arterial: 74.5 mmHg (ref 32.0–48.0)
pH, Arterial: 7.444 (ref 7.350–7.450)
pO2, Arterial: 55.8 mmHg — ABNORMAL LOW (ref 83.0–108.0)

## 2020-03-04 LAB — CBC WITH DIFFERENTIAL/PLATELET
Abs Immature Granulocytes: 0.07 10*3/uL (ref 0.00–0.07)
Basophils Absolute: 0 10*3/uL (ref 0.0–0.1)
Basophils Relative: 0 %
Eosinophils Absolute: 0 10*3/uL (ref 0.0–0.5)
Eosinophils Relative: 0 %
HCT: 36.7 % (ref 36.0–46.0)
Hemoglobin: 10.9 g/dL — ABNORMAL LOW (ref 12.0–15.0)
Immature Granulocytes: 1 %
Lymphocytes Relative: 5 %
Lymphs Abs: 0.6 10*3/uL — ABNORMAL LOW (ref 0.7–4.0)
MCH: 28.3 pg (ref 26.0–34.0)
MCHC: 29.7 g/dL — ABNORMAL LOW (ref 30.0–36.0)
MCV: 95.3 fL (ref 80.0–100.0)
Monocytes Absolute: 0.6 10*3/uL (ref 0.1–1.0)
Monocytes Relative: 6 %
Neutro Abs: 9.6 10*3/uL — ABNORMAL HIGH (ref 1.7–7.7)
Neutrophils Relative %: 88 %
Platelets: 488 10*3/uL — ABNORMAL HIGH (ref 150–400)
RBC: 3.85 MIL/uL — ABNORMAL LOW (ref 3.87–5.11)
RDW: 14.7 % (ref 11.5–15.5)
WBC: 10.9 10*3/uL — ABNORMAL HIGH (ref 4.0–10.5)
nRBC: 0 % (ref 0.0–0.2)

## 2020-03-04 LAB — GLUCOSE, CAPILLARY
Glucose-Capillary: 198 mg/dL — ABNORMAL HIGH (ref 70–99)
Glucose-Capillary: 214 mg/dL — ABNORMAL HIGH (ref 70–99)
Glucose-Capillary: 160 mg/dL — ABNORMAL HIGH (ref 70–99)
Glucose-Capillary: 197 mg/dL — ABNORMAL HIGH (ref 70–99)

## 2020-03-04 LAB — BASIC METABOLIC PANEL
Anion gap: 16 — ABNORMAL HIGH (ref 5–15)
BUN: 24 mg/dL — ABNORMAL HIGH (ref 6–20)
CO2: 36 mmol/L — ABNORMAL HIGH (ref 22–32)
Calcium: 8.9 mg/dL (ref 8.9–10.3)
Chloride: 87 mmol/L — ABNORMAL LOW (ref 98–111)
Creatinine, Ser: 0.66 mg/dL (ref 0.44–1.00)
GFR, Estimated: >60 mL/min (ref >60)
Glucose, Bld: 250 mg/dL — ABNORMAL HIGH (ref 70–99)
Potassium: 3.8 mmol/L (ref 3.5–5.1)
Sodium: 139 mmol/L (ref 135–145)

## 2020-03-04 LAB — MAGNESIUM: Magnesium: 2.4 mg/dL (ref 1.7–2.4)

## 2020-03-04 MED ORDER — MIDAZOLAM HCL 2 MG/2ML IJ SOLN
INTRAMUSCULAR | Status: AC
  Filled 2020-03-04: qty 4

## 2020-03-04 MED ORDER — PERFLUTREN LIPID MICROSPHERE
1.0000 mL | INTRAVENOUS | Status: AC | PRN
  Administered 2020-03-04: 2.5 mL via INTRAVENOUS
  Filled 2020-03-04: qty 10

## 2020-03-04 MED ORDER — MORPHINE SULFATE (PF) 2 MG/ML IV SOLN
1.0000 mg | INTRAVENOUS | Status: DC | PRN
  Administered 2020-03-04: 1 mg via INTRAVENOUS
  Administered 2020-03-04 – 2020-03-06 (×8): 2 mg via INTRAVENOUS
  Filled 2020-03-04 (×9): qty 1

## 2020-03-04 MED ORDER — FENTANYL CITRATE (PF) 100 MCG/2ML IJ SOLN
INTRAMUSCULAR | Status: AC
  Filled 2020-03-04: qty 2

## 2020-03-04 MED ORDER — BISACODYL 10 MG RE SUPP
10.0000 mg | Freq: Once | RECTAL | Status: AC
  Administered 2020-03-04: 10 mg via RECTAL
  Filled 2020-03-04: qty 1

## 2020-03-04 MED ORDER — ETOMIDATE 2 MG/ML IV SOLN
INTRAVENOUS | Status: AC
  Filled 2020-03-04: qty 20

## 2020-03-04 MED ORDER — SUCCINYLCHOLINE CHLORIDE 200 MG/10ML IV SOSY
PREFILLED_SYRINGE | INTRAVENOUS | Status: AC
  Filled 2020-03-04: qty 10

## 2020-03-04 MED ORDER — ROCURONIUM BROMIDE 10 MG/ML (PF) SYRINGE
PREFILLED_SYRINGE | INTRAVENOUS | Status: AC
  Filled 2020-03-04: qty 10

## 2020-03-04 MED ORDER — PHENYLEPHRINE 40 MCG/ML (10ML) SYRINGE FOR IV PUSH (FOR BLOOD PRESSURE SUPPORT)
PREFILLED_SYRINGE | INTRAVENOUS | Status: AC
  Filled 2020-03-04: qty 10

## 2020-03-04 NOTE — Progress Notes (Addendum)
eLink Physician-Brief Progress Note Patient Name: Christina Vincent DOB: [birth year] MRN: [id number]   Date of Service  03/04/2020  HPI/Events of Note  Review of portable Abdominal film reveals Gas distended colon and small bowel. No visible small bowel dilatation. Clinical picture is c/w swallowing air on BiPAP with +/- ileus.   eICU Interventions  PlanL 1. NGT to LIS. 2. Repeat portable abdominal film post NGT placement.  3. Dulcolax Suppository 10 mg PR X 1 now.      Intervention Category Major Interventions: Other:  Lenell Antu 03/04/2020, 12:46 AM

## 2020-03-04 NOTE — Progress Notes (Signed)
Pt is currently resting, no respiratory distress noted.  [id number], [id number], spo2 98% on 12L [place].  This Clinical research associate discussed bipap with RN and decision made to hold off on bipap placement at this time.  Bipap machine remains in room on standby.

## 2020-03-04 NOTE — Progress Notes (Deleted)
Notified Lab that ABG being sent for analysis. 

## 2020-03-04 NOTE — Progress Notes (Signed)
NAME:  Christina Vincent, MRN:  [id number], DOB:  1960/12/29, LOS: 40 ADMISSION DATE:  02/04/2020, CONSULTATION DATE:  1/20 REFERRING MD:  Bonner Puna, CHIEF COMPLAINT:  Refractory hypoxia in setting of COVID   Brief History:    This is a 60 year old female patient who was initially admitted on 12/27 with chief complaint of fever and shortness of breath, found to have positive COVID PCR, with mild hypoxia.  Initial chest x-ray was essentially clear, CRP was 16.6, she was treated with full scope of therapy including systemic steroids for hypoxia, remdesivir, 14 days of baricitinib, and supplemental oxygen. Her course has been since complicated by progressive and now persistent respiratory failure requiring ongoing heated high flow oxygen, acute bilateral pulmonary emboli 02/12/20 without evidence of right heart strain. In spite of all aggressive therapies she continues to require high supplemental oxygen dosing, patient's been fairly anxious in regards to her lack of progress.  Pulmonary asked to evaluate to determine if there were any other potential therapies and to discuss with patient concern about possible fibrotic related injuries at this point.  Past Medical History:  Type 2 diabetes, hypertension, obesity, hyperlipidemia, depression  Significant Hospital Events:  12/28-->1/20. admitted on 12/27 with chief complaint of fever and shortness of breath, found to have positive COVID PCR, with mild hypoxia.  Initial chest x-ray was essentially clear, CRP was 16.6, she was treated with full scope of therapy including systemic steroids for hypoxia, remdesivir, 14 days of baricitinib, and supplemental oxygen. Her course has been since complicated by progressive and now persistent respiratory failure requiring ongoing heated high flow oxygen,  03/02/20 -: Confused today, ABG with hypercapnia. Called to reassess. Patient endorses SOB. Feels better on bipap.  Consults:  pulm 1/20 Psych 1/19 Procedures:    Significant Diagnostic Tests:  CT angiogram 1/4: Acute bilateral lower lobe and right middle lobe segmental pulmonary emboli. No evidence of right heart strain. Multifocal pneumonia with worsening of aeration since 02/07/2020.  Micro Data:  covid + 02/04/20  Antimicrobials:   Interim History / Subjective:   She looks worse specifically when comparing her to when I met her on 1/21.  Continues to have exertional dyspnea, only speaking in short phrases.  May have had some improvement with nasogastric tube  Objective   Blood pressure 104/60, pulse (Abnormal) 117, temperature 98.6 F (37 C), temperature source Oral, resp. rate (Abnormal) 34, height 5' 8"  (1.727 m), weight 98.9 kg, SpO2 93 %.    FiO2 (%):  [50 %-70 %] 50 %   Intake/Output Summary (Last 24 hours) at 03/04/2020 0930 Last data filed at 03/04/2020 0600 Gross per 24 hour  Intake 274.31 ml  Output 300 ml  Net -25.69 ml   Filed Weights   02/04/20 1838  Weight: 98.9 kg    Examination:  General 60 year old terminally ill female currently on high flow and supplemental oxygen HEENT nasogastric tube in place with bilious output Pulmonary diminished bilaterally positive accessory use remains on high flow in 100% Cardiac regular rhythm Abdomen soft not tender Extremities are warm and dry Neuro awake, oriented x3 but perseverates   Resolved Hospital Problem list    Assessment & Plan:   Acute hypoxic and hypercapnic respiratory failure due to covid 19 viral pneumonia. 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  Agree  what appears to be element of ileus is certainly a contributing factor however her underlying injury from Covid and resulted Covid related fibrosis seems to be insurmountable.  Have reviewed the  note by primary team, and agree that we really have nothing else to add here, if placed on life support I do not think she will come off of ventilator Plan Continue new current level of support Keep NG tube to low intermittent suction Continue bronchodilators Would fully support transition to comfort  Acute metabolic encephalopathy due to hypercapnia Plan Supportive care  I would keep her in the intensive care unit until goals of care discussion is completed.  It sounds as though the patient would like to transition to comfort with the primary team planning on discussing this with the husband a little later today  Erick Colace ACNP-BC [place] Pager # [phone] OR # [phone] if no answer

## 2020-03-04 NOTE — Progress Notes (Signed)
  Echocardiogram 2D Echocardiogram has been performed.  Christina Vincent 03/04/2020, 4:35 PM

## 2020-03-04 NOTE — Progress Notes (Signed)
CRITICAL VALUE ALERT  Critical Value:  PCO2 74.5  Date & Time Notied:  03/04/20 @1318   Provider Notified: MD @1318   Orders Received/Actions taken: no orders yet

## 2020-03-04 NOTE — Progress Notes (Signed)
Spoke to MD [facility] who states pt to be placed back on BiPap. RT Kennan notified and aware.

## 2020-03-04 NOTE — Progress Notes (Signed)
PROGRESS NOTE  [name]  ZOX:[id number] DOB: 09-19-1960 DOA: 02/04/2020 PCP: Avon Gully, MD   Brief Narrative: Christina Vincent is a 60 y.o. female with a history of T2DM, HTN, HLD, obesity and depression who presented with fever and dyspnea found to have positive SARS-CoV-2 PCR and mild hypoxia despite clear CXR on 12/27. CRP 16.6, PCT negative. Full scope treatment commenced with subsequent worsening of hypoxia. CTA chest 1/4 revealed worsening airspace disease compatible with progressive covid pneumonia as well as acute bilateral pulmonary emboli without right heart strain. Severe hypoxemia continues. Isolation period has been completed while still hospitalized. CXR repeated 1/13, 1/18 and 1/20 have continued to demonstrate significant heterogenous opacities bilaterally. Psychiatry was consulted for medical management of concomitant anxiety. PCCM consulted for further recommendations 02/28/2020. On 1/23 she became lethargic and was noted to be severely hypercarbic, placed on BiPAP and transferred to ICU. Plan to continue BiPAP and goals of care discussions.  Assessment & Plan: Principal Problem:   28-May-2017 novel coronavirus disease (COVID-19) Active Problems:   Hypertension   Type 2 diabetes mellitus (HCC)   Dyslipidemia   Depression   Class 1 obesity   Acute hypoxemic respiratory failure due to COVID-19 [facility])   Pulmonary emboli (HCC)  Acute hypoxemic and hypercarbic respiratory failure due to covid-19 pneumonia and PE: SARS-CoV-2 positive on 02/04/2020, no longer on isolation s/p full courses of remdesivir, baricitinib and steroids.  - Restarted steroids, CRP modestly elevated, PCT negative. - Empiric lasix to keep net negative, echo ordered by CCM and pending. - PCCM following, recommending continued intermittent BiPAP. Mentation improved this AM, though clearly still hypercarbic and waning mentation during family meeting. Pt unable to confirm to husband her desires for withdrawal of  care. Therefore, I have contacted PCCM as I believe she requires intubation. Though I was very very clear that I do not believe this will change the outcome in this sweet lady's case.  Acute hypercarbic encephalopathy: 1/23, improved with BiPAP on most recent ABG.  - Monitor closely, avoid oversedation.  Segmental PE: No right heart strain. - Continue lovenox 1mg /kg q12h.   T2DM with steroid-induced hyperglycemia: HbA1c 7.7%.  - Continue current management, improved with steroid off.  Anxiety, insomnia, hypnagogic hallucinations: Exacerbated by mother's death from covid 05/29/18.  - Started on precedex gtt overnight, can continue; sedation per PCCM. - Psychiatry consulted, added seroquel BID. We will stop this for now. - Xanax 0.5mg  po TID prn. Hold for sedation. Can give IV ativan prn when on BiPAP. - Outpatient support will be needed.  - Chaplain consulted.   Abnormal TSH: Free T4 and T3 wnl.  - Recheck after convalescence  HTN:  - Continue losartan  LFT elevation: Mild and stable. Continue intermittent monitoring.  Mouth pain: Resolved.  - prn magic mouthwash.   Stress > urge urinary incontinence:  - Pt declines initiation of medication, will continue lifestyle modifications.  Obesity: Estimated body mass index is 33.15 kg/m as calculated from the following:   Height as of this encounter: 5\' 8"  (1.727 m).   Weight as of this encounter: 98.9 kg.  DVT prophylaxis: Lovenox Code Status: Full Family Communication: Husband at bedside and by phone today.  Disposition Plan:  Status is: Inpatient  Remains inpatient appropriate because:Inpatient level of care appropriate due to severity of illness  Dispo:  Patient From: Home  Planned Disposition: Home with Health Care Svc    Expected discharge date: >3 days  Medically stable for discharge: No  Consultants:   PCCM  Psychiatry  Procedures:   None  Antimicrobials:  Remdesivir   Subjective: Patient seen this morning  reports she doesn't "wants to do this anymore." She has had no improvement in work of breathing or shortness of breath over the past several weeks, states she wants to meet the Lord. She asked me to call her husband, which I did this morning, and we have arranged family meeting for this afternoon when he gets here around 2pm.   At meeting the patient was speaking in 2 word sentences without ability to definitively state her desires. He says "I'm her husband, we're not giving up on her."   Objective: Vitals:   03/04/20 1219 03/04/20 1232 03/04/20 1250 03/04/20 1300  BP:      Pulse:  (!) 123    Resp:    (!) 27  Temp:      TempSrc:      SpO2: 97% 96% 93% 93%  Weight:      Height:        Intake/Output Summary (Last 24 hours) at 03/04/2020 1345 Last data filed at 03/04/2020 1117 Gross per 24 hour  Intake 24.31 ml  Output 800 ml  Net -775.69 ml   Filed Weights   02/04/20 1838  Weight: 98.9 kg   Gen: Ill-appearing female in distress Pulm: increased WOB, tachypnea into 40's, coarse. CV: Regular tachycardia. No murmur, rub, or gallop. No JVD, no dependent edema. GI: Abdomen soft, non-tender, non-distended, with normoactive bowel sounds.  Ext: Warm, no deformities Skin: No rashes, lesions or ulcers on visualized skin. Neuro: Drowsy this afternoon, more alert when I spoke with her this morning, oriented, knows me, speech altered due to dyspnea, speaking 1-2 word sentences. Psych: UTD   Data Reviewed: I have personally reviewed following labs and imaging studies  CBC: Recent Labs  Lab 02/27/20 0445 03/02/20 1703 03/03/20 0744 03/04/20 0308  WBC 12.7* 7.8 8.1 10.9*  NEUTROABS  --  6.7 7.3 9.6*  HGB 11.4* 11.2* 10.2* 10.9*  HCT 37.3 38.6 34.2* 36.7  MCV 93.5 98.0 95.5 95.3  PLT 333 409* 428* 488*   Basic Metabolic Panel: Recent Labs  Lab 02/27/20 0445 03/02/20 1703 03/03/20 0744 03/04/20 0308  NA 141 139 140 139  K 4.6 4.9 5.0 3.8  CL 96* 88* 88* 87*  CO2 32 42* 40* 36*   GLUCOSE 147* 93 186* 250*  BUN 18 11 20  24*  CREATININE 0.40* 0.58 0.51 0.66  CALCIUM 9.1 9.2 9.0 8.9  MG  --   --   --  2.4  PHOS  --   --   --  4.6   GFR: Estimated Creatinine Clearance: 95.4 mL/min (by C-G formula based on SCr of 0.66 mg/dL). Liver Function Tests: Recent Labs  Lab 03/02/20 1703  AST 28  ALT 37  ALKPHOS 92  BILITOT 0.4  PROT 8.1  ALBUMIN 2.9*    Scheduled Meds: . albuterol  2.5 mg Nebulization Q6H  . arformoterol  15 mcg Nebulization BID  . vitamin C  500 mg Oral Daily  . benzonatate  200 mg Oral BID  . budesonide  0.5 mg Nebulization BID  . chlorhexidine  15 mL Mouth Rinse BID  . Chlorhexidine Gluconate Cloth  6 each Topical Daily  . enoxaparin (LOVENOX) injection  100 mg Subcutaneous BID  . insulin aspart  0-20 Units Subcutaneous TID WC  . mouth rinse  15 mL Mouth Rinse q12n4p  . methylPREDNISolone (SOLU-MEDROL) injection  60 mg Intravenous Q12H  . mirtazapine  15 mg Oral QHS  . traZODone  50 mg Oral QHS  . zinc sulfate  220 mg Oral Daily   Continuous Infusions: . dexmedetomidine (PRECEDEX) IV infusion 0.4 mcg/kg/hr (03/03/20 0900)     LOS: 28 days   Time spent: 35 minutes.  Tyrone Nine, MD [facility] [email or web address] 03/04/2020, 1:45 PM

## 2020-03-04 NOTE — Progress Notes (Addendum)
   D/w APp - no intubation.   CCCM will sign offf DNR/DNI  Contnue current medical care with concurrent palliation -> titrate palliation if getting worse  Can go to progressive from ccm perspective + if bed cruch tonight. Otherwise in AM 03/05/20   Ccm will sign off    SIGNATURE    Dr. Kalman Shan, M.D., F.C.C.P,  Pulmonary and Critical Care Medicine Staff Physician, [facility] Director - Interstitial Lung Disease  Program  Pulmonary Fibrosis [facility] [place], [place], [address]  Pager: [phone], If no answer  OR between  19:00-7:00h: page [phone]  [phone] Telephone (clinical office): [phone] Telephone (research): [phone]  6:21 PM 03/04/2020

## 2020-03-04 NOTE — Progress Notes (Signed)
eLink Physician-Brief Progress Note Patient Name: CALAYA GILDNER DOB: 1960/10/21 MRN: [id number]   Date of Service  03/04/2020  HPI/Events of Note  Nursing reports increased abdominal distention and abdominal pain on BiPAP. Suspect that patient is swallowing air d/t BiPAP resulting in abdominal distention and discomfort.   eICU Interventions  Plan: 1. Portable Abdominal film STAT.     Intervention Category Major Interventions: Other:  Montey Ebel Dennard Nip 03/04/2020, 12:12 AM

## 2020-03-04 NOTE — Progress Notes (Signed)
Long goals of care discussed at bedside with the patient, her husband, and extended family.  Ultimately they decided to continue w/ Current care and NOT to proceed w/ intubation.  Plan Full DNR Cont supportive measures PRN morphine for air hunger   They understand that at this point if she worsens we would need to transition to comfort and are in agreement with this   40 minutes  Simonne Martinet ACNP-BC [facility] Pulmonary/Critical Care Pager # [phone] OR # [phone] if no answer

## 2020-03-04 NOTE — Progress Notes (Signed)
Brief progress note: Patient seen this morning reports she doesn't "wants to do this anymore." She has had no improvement in work of breathing or shortness of breath over the past several weeks, states she wants to meet the Lord. She asked me to call her husband, which I did this morning, and we have arranged family meeting for this afternoon when he gets here around 2pm. We will discuss transition to comfort measures. I confirmed to her what we've discussed many times in the past few weeks since I've known her, that I fear her lung disease continues to worsen despite aggressive use of all medications and procedures available to [place]. Her prognosis remains inexorably poor regardless of heroic measures/intubation and I find it reasonable to abide by her decision to convert to hospice-type management.   Full note to follow after family meeting.  Christina Junker, MD 03/04/2020 9:14 AM

## 2020-03-04 NOTE — Progress Notes (Signed)
PT Cancellation Note  Patient Details Name: Christina Vincent MRN: [id number] DOB: [birth year]   Cancelled Treatment:     per chart review, pt not medically stable.  Will continue to follow.  LPT to see next visit to re access plan/goals.  Pt has had a medical decline.      Felecia Shelling  PTA Acute  Rehabilitation Services Pager      [phone] Office      [phone]

## 2020-03-04 NOTE — Progress Notes (Addendum)
Pt off BIPAP at this time pending abdominal X-ray due to increase in abdominal distention and pain.  Pt currently on 15 LPM [place] Gravity with NRB.

## 2020-03-05 LAB — GLUCOSE, CAPILLARY
Glucose-Capillary: 239 mg/dL — ABNORMAL HIGH (ref 70–99)
Glucose-Capillary: 176 mg/dL — ABNORMAL HIGH (ref 70–99)
Glucose-Capillary: 203 mg/dL — ABNORMAL HIGH (ref 70–99)
Glucose-Capillary: 189 mg/dL — ABNORMAL HIGH (ref 70–99)
Glucose-Capillary: 231 mg/dL — ABNORMAL HIGH (ref 70–99)

## 2020-03-05 LAB — CBC WITH DIFFERENTIAL/PLATELET
Abs Immature Granulocytes: 0.05 10*3/uL (ref 0.00–0.07)
Basophils Absolute: 0 10*3/uL (ref 0.0–0.1)
Basophils Relative: 0 %
Eosinophils Absolute: 0 10*3/uL (ref 0.0–0.5)
Eosinophils Relative: 0 %
HCT: 36.6 % (ref 36.0–46.0)
Hemoglobin: 10.8 g/dL — ABNORMAL LOW (ref 12.0–15.0)
Immature Granulocytes: 1 %
Lymphocytes Relative: 7 %
Lymphs Abs: 0.7 10*3/uL (ref 0.7–4.0)
MCH: 28.5 pg (ref 26.0–34.0)
MCHC: 29.5 g/dL — ABNORMAL LOW (ref 30.0–36.0)
MCV: 96.6 fL (ref 80.0–100.0)
Monocytes Absolute: 0.9 10*3/uL (ref 0.1–1.0)
Monocytes Relative: 9 %
Neutro Abs: 8.5 10*3/uL — ABNORMAL HIGH (ref 1.7–7.7)
Neutrophils Relative %: 83 %
Platelets: 493 10*3/uL — ABNORMAL HIGH (ref 150–400)
RBC: 3.79 MIL/uL — ABNORMAL LOW (ref 3.87–5.11)
RDW: 14.9 % (ref 11.5–15.5)
WBC: 10 10*3/uL (ref 4.0–10.5)
nRBC: 0.2 % (ref 0.0–0.2)

## 2020-03-05 LAB — BASIC METABOLIC PANEL
Anion gap: 15 (ref 5–15)
BUN: 31 mg/dL — ABNORMAL HIGH (ref 6–20)
CO2: 45 mmol/L — ABNORMAL HIGH (ref 22–32)
Calcium: 9.2 mg/dL (ref 8.9–10.3)
Chloride: 84 mmol/L — ABNORMAL LOW (ref 98–111)
Creatinine, Ser: 0.62 mg/dL (ref 0.44–1.00)
GFR, Estimated: >60 mL/min (ref >60)
Glucose, Bld: 237 mg/dL — ABNORMAL HIGH (ref 70–99)
Potassium: 4 mmol/L (ref 3.5–5.1)
Sodium: 144 mmol/L (ref 135–145)

## 2020-03-05 LAB — PHOSPHORUS: Phosphorus: 4.3 mg/dL (ref 2.5–4.6)

## 2020-03-05 MED ORDER — HYDROCORTISONE (PERIANAL) 2.5 % EX CREA: TOPICAL_CREAM | Freq: Four times a day (QID) | CUTANEOUS | Status: DC

## 2020-03-05 MED ORDER — HYDROCORTISONE (PERIANAL) 2.5 % EX CREA
TOPICAL_CREAM | Freq: Two times a day (BID) | CUTANEOUS | Status: DC | PRN
  Administered 2020-03-05 – 2020-03-08 (×2): 1 via RECTAL
  Filled 2020-03-05 (×2): qty 28.35

## 2020-03-05 NOTE — Progress Notes (Addendum)
NAME:  Christina Vincent, MRN:  [id number], DOB:  [birth year], LOS: 32 ADMISSION DATE:  02/04/2020, CONSULTATION DATE:  1/20 REFERRING MD:  Bonner Puna, CHIEF COMPLAINT:  Refractory hypoxia in setting of COVID   Brief History:    This is a 60 year old female patient who was initially admitted on 12/27 with chief complaint of fever and shortness of breath, found to have positive COVID PCR, with mild hypoxia.  Initial chest x-ray was essentially clear, CRP was 16.6, she was treated with full scope of therapy including systemic steroids for hypoxia, remdesivir, 14 days of baricitinib, and supplemental oxygen. Her course has been since complicated by progressive and now persistent respiratory failure requiring ongoing heated high flow oxygen, acute bilateral pulmonary emboli 02/12/20 without evidence of right heart strain. In spite of all aggressive therapies she continues to require high supplemental oxygen dosing, patient's been fairly anxious in regards to her lack of progress.  Pulmonary asked to evaluate to determine if there were any other potential therapies and to discuss with patient concern about possible fibrotic related injuries at this point.  Past Medical History:  Type 2 diabetes, hypertension, obesity, hyperlipidemia, depression  Significant Hospital Events:  12/28-->1/20. admitted on 12/27 with chief complaint of fever and shortness of breath, found to have positive COVID PCR, with mild hypoxia.  Initial chest x-ray was essentially clear, CRP was 16.6, she was treated with full scope of therapy including systemic steroids for hypoxia, remdesivir, 14 days of baricitinib, and supplemental oxygen. Her course has been since complicated by progressive and now persistent respiratory failure requiring ongoing heated high flow oxygen,  03/02/20 -: Confused today, ABG with hypercapnia. Called to reassess. Patient endorses SOB. Feels better on bipap.  1/25 -  She looks worse specifically when comparing  her to when I met her on 1/21.  Continues to have exertional dyspnea, only speaking in short phrases.  May have had some improvement with nasogastric tube - > moved to dnr/dni with bipap and concurrent palliation. Esclaate pallation if she declines    Consults:  pulm 1/20 Psych 1/19 Procedures:   Significant Diagnostic Tests:  CT angiogram 1/4: Acute bilateral lower lobe and right middle lobe segmental pulmonary emboli. No evidence of right heart strain. Multifocal pneumonia with worsening of aeration since 02/07/2020.  Micro Data:  covid + 02/04/20  Antimicrobials:   Interim History / Subjective:   03/05/2020 -still has an NG tube.  She wants to eat.  She refused to wear BiPAP last night.  She is down to 12 L high flow nasal cannula but she is not on heated high flow nasal cannula.  She is a little more awake and less distressed today.  Echo with gr1 ddx   Objective   Blood pressure 140/83, pulse (!) 117, temperature (!) 100.8 F (38.2 C), temperature source Axillary, resp. rate (!) 32, height 5' 8"  (1.727 m), weight 98.9 kg, SpO2 93 %.        Intake/Output Summary (Last 24 hours) at 03/05/2020 1007 Last data filed at 03/05/2020 0200 Gross per 24 hour  Intake --  Output 1100 ml  Net -1100 ml   Filed Weights   02/04/20 1838  Weight: 98.9 kg    Examination: General Appearance: She looks much improved compared to 24 hours ago.  She does look deconditioned Head:  Normocephalic, without obvious abnormality, atraumatic Eyes:  PERRL - yes, conjunctiva/corneas - muddu     Ears:  Normal external ear canals, both ears Nose:  G tube - [place]  o2 + Throat:  ETT TUBE - nono , OG tube - n Neck:  Supple,  No enlargement/tenderness/nodules Lungs: Respiratory rate around 30 on 12 L nasal cannula pulse ox 96%.  No paradoxical respiration Heart:  S1 and S2 normal, no murmur, CVP - no.  Pressors - no Abdomen:  Soft, no masses, no organomegaly Genitalia / Rectal:  Not done Extremities:   Extremities- intact Skin:  ntact in exposed areas . Sacral area - x Neurologic:  Sedation - none -> RASS - +1 . Moves all 4s - yes. CAM-ICU - neg . Orientation - x3+      Resolved Hospital Problem list    Assessment & Plan:   Acute hypoxic and hypercapnic respiratory failure due to covid 19 viral pneumonia. 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  She almost required intubation yesterday but opted for DO NOT INTUBATE.  Current care plan is DO NOT INTUBATE and DO NOT RESUSCITATE.  But she is okay for BiPAP and full medical care with concurrent palliation  Plan Pulse ox goal greater than 92% -continue oxygen for this Strongly recommend BiPAP nightly with daytime 2 hours on and 4 hours off   -continue this protocol until there is  substantial improvement in hypoxemia  - monitor hypercarbia  - if surbives to dc might need bipap for home Continue lovenox Continue BD Concurrent palliation as neede - > if declines -> escalate this component DNR/DNI but full active medical care  PRN lasix PRN abg     Acute metabolic encephalopathy due to hypercapnia  - 03/05/2020 - resolved   Plan Supportive care Careful with  GI  - she wants to eat. Abd soft. NG output not much  Plan  - triad MD to decide but ok from PCCM pserpsective    Husband and patient updated  Move to progressive  CCM will follow every few days  ATTESTATION & SIGNATURE   Dr. Brand Males, M.D., [facility].C.P Pulmonary and Critical Care Medicine Staff Physician [facility] Pager: [phone], If no answer or between  15:00h - 7:00h: call [phone]  03/05/2020 10:08 AM     LABS    PULMONARY Recent Labs  Lab 03/02/20 1318 03/02/20 1634 03/03/20 0150 03/04/20 1246  PHART 7.218* 7.222* 7.408  7.444  PCO2ART 119* 122* 74.6* 74.5*  PO2ART 169* 174* 57.8* 55.8*  HCO3 46.7* 48.3* 46.3* 50.2*  O2SAT 98.6 98.6 91.4 88.5    CBC Recent Labs  Lab 03/03/20 0744 03/04/20 0308 03/05/20 0305  HGB 10.2* 10.9* 10.8*  HCT 34.2* 36.7 36.6  WBC 8.1 10.9* 10.0  PLT 428* 488* 493*    COAGULATION No results for input(s): INR in the last 168 hours.  CARDIAC  No results for input(s): TROPONINI in the last 168 hours. No results for input(s): PROBNP in the last 168 hours.   CHEMISTRY Recent Labs  Lab 03/02/20 1703 03/03/20 0744 03/04/20 0308 03/05/20 0305  NA 139 140 139 144  K 4.9 5.0 3.8 4.0  CL 88* 88* 87* 84*  CO2 42* 40* 36* 45*  GLUCOSE 93 186* 250* 237*  BUN 11 20 24* 31*  CREATININE 0.58 0.51 0.66 0.62  CALCIUM 9.2 9.0 8.9 9.2  MG  --   --  2.4  --   PHOS  --   --  4.6 4.3   Estimated Creatinine Clearance: 95.4 mL/min (by C-G formula based on SCr of 0.62 mg/dL).   LIVER Recent Labs  Lab 03/02/20 1703  AST 28  ALT 37  ALKPHOS 92  BILITOT 0.4  PROT 8.1  ALBUMIN 2.9*     INFECTIOUS Recent Labs  Lab 03/03/20 0744  PROCALCITON 0.10     ENDOCRINE CBG (last 3)  Recent Labs    03/04/20 1737 03/04/20 2210 03/05/20 0738  GLUCAP 160* 197* 239*         IMAGING x48h  - image(s) personally visualized  -   highlighted in bold DG Abd Portable 1V  Result Date: 03/04/2020 CLINICAL DATA:  NG tube placement EXAM: PORTABLE ABDOMEN - 1 VIEW COMPARISON:  March 04, 2020 FINDINGS: The enteric tube projects over the gastric body. There is gaseous distention of loops of bowel throughout the abdomen, favored to represent mostly colon. There is no definite pneumatosis or free air involving the visualized portions of the abdomen. IMPRESSION: Enteric tube projects over the gastric body. Electronically Signed   By: Constance Holster M.D.   On: 03/04/2020 02:21   DG Abd Portable 1V  Result Date: 03/04/2020 CLINICAL DATA:  Abdominal distension and pain EXAM:  PORTABLE ABDOMEN - 1 VIEW COMPARISON:  None. FINDINGS: Gas distended colon. Gas throughout the small bowel without visible dilatation. Bibasilar interstitial opacities. IMPRESSION: Gas distended colon and small bowel. No visible small bowel dilatation. Electronically Signed   By: Ulyses Jarred M.D.   On: 03/04/2020 00:48   ECHOCARDIOGRAM COMPLETE  Result Date: 03/04/2020    ECHOCARDIOGRAM REPORT   Patient Name:   Christina Vincent Date of Exam: 03/04/2020 Medical Rec #:  [id number]      Height:       68.0 in Accession #:    [id number]     Weight:       218.0 lb Date of Birth:  06-Jul-1960       BSA:          2.120 m Patient Age:    31 years       BP:           99/53 mmHg Patient Gender: F              HR:           122 bpm. Exam Location:  Inpatient Procedure: 2D Echo, Color Doppler, Cardiac Doppler and Intracardiac            Opacification Agent Indications:    Acutre respiratory distress R06.03  History:        Patient has no prior history of Echocardiogram examinations.                 Risk Factors:Dyslipidemia.  Sonographer:    Bernadene Person RDCS Referring Phys: [place]  1. Technically difficult study; definity used.  2. Left ventricular ejection fraction, by estimation, is 60 to 65%. The left ventricle has normal function. The left ventricle has no regional wall motion abnormalities. Left ventricular diastolic parameters are consistent with Grade I diastolic dysfunction (impaired relaxation).  3. Right ventricular systolic function is normal. The right ventricular size is normal. Tricuspid regurgitation signal is inadequate for assessing PA pressure.  4. The mitral valve is normal in structure. No evidence of mitral valve regurgitation. No evidence of mitral stenosis.  5. The aortic valve is normal in structure. Aortic valve regurgitation is not visualized. No aortic stenosis is present.  6. The inferior  vena cava is normal in size with greater than 50% respiratory variability,  suggesting right atrial pressure of 3 mmHg. FINDINGS  Left Ventricle: Left ventricular ejection fraction, by estimation, is 60 to 65%. The left ventricle has normal function. The left ventricle has no regional wall motion abnormalities. Definity contrast agent was given IV to delineate the left ventricular  endocardial borders. The left ventricular internal cavity size was normal in size. There is no left ventricular hypertrophy. Left ventricular diastolic parameters are consistent with Grade I diastolic dysfunction (impaired relaxation). Right Ventricle: The right ventricular size is normal. Right ventricular systolic function is normal. Tricuspid regurgitation signal is inadequate for assessing PA pressure. The tricuspid regurgitant velocity is 1.40 m/s, and with an assumed right atrial  pressure of 3 mmHg, the estimated right ventricular systolic pressure is 28.4 mmHg. Left Atrium: Left atrial size was normal in size. Right Atrium: Right atrial size was normal in size. Pericardium: There is no evidence of pericardial effusion. Mitral Valve: The mitral valve is normal in structure. No evidence of mitral valve regurgitation. No evidence of mitral valve stenosis. Tricuspid Valve: The tricuspid valve is normal in structure. Tricuspid valve regurgitation is trivial. No evidence of tricuspid stenosis. Aortic Valve: The aortic valve is normal in structure. Aortic valve regurgitation is not visualized. No aortic stenosis is present. Pulmonic Valve: The pulmonic valve was not well visualized. Pulmonic valve regurgitation is not visualized. No evidence of pulmonic stenosis. Aorta: The aortic root is normal in size and structure. Venous: The inferior vena cava is normal in size with greater than 50% respiratory variability, suggesting right atrial pressure of 3 mmHg.  Additional Comments: Technically difficult study; definity used.  LEFT VENTRICLE PLAX 2D LVIDd:         3.10 cm  Diastology LVIDs:         2.20 cm  LV e'  medial:    4.35 cm/s LV PW:         1.10 cm  LV E/e' medial:  12.4 LV IVS:        1.10 cm  LV e' lateral:   6.74 cm/s LVOT diam:     1.90 cm  LV E/e' lateral: 8.0 LV SV:         35 LV SV Index:   16 LVOT Area:     2.84 cm  RIGHT VENTRICLE TAPSE (M-mode): 1.2 cm LEFT ATRIUM             Index       RIGHT ATRIUM           Index LA diam:        2.40 cm 1.13 cm/m  RA Area:     10.80 cm LA Vol (A2C):   22.3 ml 10.52 ml/m RA Volume:   20.70 ml  9.76 ml/m LA Vol (A4C):   22.5 ml 10.61 ml/m LA Biplane Vol: 23.9 ml 11.27 ml/m  AORTIC VALVE LVOT Vmax:   90.00 cm/s LVOT Vmean:  63.600 cm/s LVOT VTI:    0.122 m  AORTA Ao Root diam: 3.50 cm Ao Asc diam:  3.10 cm MITRAL VALVE               TRICUSPID VALVE MV Area (PHT): 2.43 cm    TR Peak grad:   7.8 mmHg MV Decel Time: 312 msec    TR Vmax:        140.00 cm/s MV E velocity: 53.80 cm/s MV A velocity: 83.40 cm/s  SHUNTS MV E/A ratio:  0.65        Systemic VTI:  0.12 m                            Systemic Diam: 1.90 cm Kirk Ruths MD Electronically signed by Kirk Ruths MD Signature Date/Time: 03/04/2020/4:38:19 PM    Final

## 2020-03-05 NOTE — Progress Notes (Signed)
PROGRESS NOTE    [name]  XBW:[id number] DOB: Jun 08, 1960 DOA: 02/04/2020 PCP: Avon Gully, MD     Brief Narrative:  Christina Vincent is a 60 y.o. female with a history of T2DM, HTN, HLD, obesity and depression who presented with fever and dyspnea found to have positive SARS-CoV-2 PCR and mild hypoxia despite clear CXR on 12/27. CRP 16.6, PCT negative. Full scope treatment commenced with subsequent worsening of hypoxia. CTA chest 1/4 revealed worsening airspace disease compatible with progressive covid pneumonia as well as acute bilateral pulmonary emboli without right heart strain. Severe hypoxemia continues. Isolation period has been completed while still hospitalized. CXR repeated 1/13, 1/18 and 1/20 have continued to demonstrate significant heterogenous opacities bilaterally. Psychiatry was consulted for medical management of concomitant anxiety. PCCM consulted for further recommendations 02/28/2020. On 1/23 she became lethargic and was noted to be severely hypercarbic, placed on BiPAP and transferred to ICU. Plan to continue BiPAP and goals of care discussions.  After discussion with family, patient has elected for DNR status but continue full scope of medical care.  New events last 24 hours / Subjective: Husband is at bedside.  Patient without acute complaints, states that her breathing has improved since yesterday.  Per husband, patient has been intermittently confused due to morphine use.  Had a bowel movement yesterday.  Assessment & Plan:   Principal Problem:   2019 novel coronavirus disease (COVID-19) Active Problems:   Hypertension   Type 2 diabetes mellitus (HCC)   Dyslipidemia   Depression   Class 1 obesity   Acute hypoxemic respiratory failure due to COVID-19 [facility])   Pulmonary emboli (HCC)   Acute hypoxemic respiratory failure secondary to COVID-19  -Continue 12 L O2, wean as able.  Continue BiPAP nightly and intermittently through the day as recommended by  PCCM -Patient remains DNI  COVID-19 Pneumonia -Tested positive on 02/04/2020.  Off isolation -Completed full course of Remdesivir, steroid, baricitinib -Supportive treatments as ordered, encourage IS, encourage prone positioning, encourage mobilization  -Restarted on steroids due to continued respiratory failure -Continue Pulmicort, Brovana, albuterol  COVID-19 Labs  Recent Labs    03/03/20 0806  CRP 2.9*    Lab Results  Component Value Date   SARSCOV2NAA POSITIVE (A) 02/04/2020   Acute PE -Continue Lovenox  Diabetes mellitus type 2 -Continue sliding scale insulin  Anxiety -Appreciate psychiatry -Continue Xanax as needed  Ileus -Abdominal x-ray revealed gas distended distended colon and small bowel without visible small bowel dilatation.  NG tube placed -NG output 1000 mL yesterday  -Had 1 bowel movement yesterday  Obesity -Estimated body mass index is 33.15 kg/m as calculated from the following:   Height as of this encounter: 5\' 8"  (1.727 m).   Weight as of this encounter: 98.9 kg.  Abnormal TSH -Repeat as outpatient in 4 to 6 weeks   DVT prophylaxis: Lovenox   Code Status: DNR Family Communication: Husband at bedside Disposition Plan:  Status is: Inpatient  Remains inpatient appropriate because:Hemodynamically unstable   Dispo:  Patient From: Home  Planned Disposition: Home with Health Care Svc  Expected discharge date: > 3 days  Medically stable for discharge: No.  Remains on high levels of oxygen today        Consultants:   PCCM  Psychiatry  Antimicrobials:  Anti-infectives (From admission, onward)   Start     Dose/Rate Route Frequency Ordered Stop   02/05/20 1600  remdesivir 100 mg in sodium chloride 0.9 % 100 mL IVPB  100 mg 200 mL/hr over 30 Minutes Intravenous Daily 02/04/20 2245 02/08/20 1055   02/04/20 2300  remdesivir 100 mg in sodium chloride 0.9 % 100 mL IVPB        100 mg 200 mL/hr over 30 Minutes Intravenous Every  30 min 02/04/20 2245 02/05/20 0129        Objective: Vitals:   03/05/20 0800 03/05/20 0900 03/05/20 1000 03/05/20 1200  BP: 125/81 140/83 115/78 126/82  Pulse: (!) 108 (!) 117 (!) 104 (!) 112  Resp: (!) 37 (!) 32 (!) 26 20  Temp: (!) 100.8 F (38.2 C)   100.2 F (37.9 C)  TempSrc: Axillary   Axillary  SpO2: 90% 93% 97% 99%  Weight:      Height:        Intake/Output Summary (Last 24 hours) at 03/05/2020 1318 Last data filed at 03/05/2020 0200 Gross per 24 hour  Intake --  Output 600 ml  Net -600 ml   Filed Weights   02/04/20 1838  Weight: 98.9 kg    Examination:  General exam: Appears calm and comfortable  Respiratory system: Diminished breath sounds, increased respiratory rate, but does not appear to be in any distress.  On 12 L high flow nasal cannula Cardiovascular system: S1 & S2 heard, tachycardic, regular rhythm. No murmurs. No pedal edema. Gastrointestinal system: Abdomen is nondistended, soft and nontender.  NG tube in place Central nervous system: Alert. No focal neurological deficits. Extremities: Symmetric in appearance  Skin: No rashes, lesions or ulcers on exposed skin  Psychiatry: Judgement and insight appear normal. Mood & affect appropriate.   Data Reviewed: I have personally reviewed following labs and imaging studies  CBC: Recent Labs  Lab 03/02/20 1703 03/03/20 0744 03/04/20 0308 03/05/20 0305  WBC 7.8 8.1 10.9* 10.0  NEUTROABS 6.7 7.3 9.6* 8.5*  HGB 11.2* 10.2* 10.9* 10.8*  HCT 38.6 34.2* 36.7 36.6  MCV 98.0 95.5 95.3 96.6  PLT 409* 428* 488* 493*   Basic Metabolic Panel: Recent Labs  Lab 03/02/20 1703 03/03/20 0744 03/04/20 0308 03/05/20 0305  NA 139 140 139 144  K 4.9 5.0 3.8 4.0  CL 88* 88* 87* 84*  CO2 42* 40* 36* 45*  GLUCOSE 93 186* 250* 237*  BUN 11 20 24* 31*  CREATININE 0.58 0.51 0.66 0.62  CALCIUM 9.2 9.0 8.9 9.2  MG  --   --  2.4  --   PHOS  --   --  4.6 4.3   GFR: Estimated Creatinine Clearance: 95.4 mL/min (by  C-G formula based on SCr of 0.62 mg/dL). Liver Function Tests: Recent Labs  Lab 03/02/20 1703  AST 28  ALT 37  ALKPHOS 92  BILITOT 0.4  PROT 8.1  ALBUMIN 2.9*   No results for input(s): LIPASE, AMYLASE in the last 168 hours. No results for input(s): AMMONIA in the last 168 hours. Coagulation Profile: No results for input(s): INR, PROTIME in the last 168 hours. Cardiac Enzymes: No results for input(s): CKTOTAL, CKMB, CKMBINDEX, TROPONINI in the last 168 hours. BNP (last 3 results) No results for input(s): PROBNP in the last 8760 hours. HbA1C: No results for input(s): HGBA1C in the last 72 hours. CBG: Recent Labs  Lab 03/04/20 0754 03/04/20 1210 03/04/20 1737 03/04/20 2210 03/05/20 0738  GLUCAP 198* 214* 160* 197* 239*   Lipid Profile: No results for input(s): CHOL, HDL, LDLCALC, TRIG, CHOLHDL, LDLDIRECT in the last 72 hours. Thyroid Function Tests: No results for input(s): TSH, T4TOTAL, FREET4, T3FREE, THYROIDAB in the  last 72 hours. Anemia Panel: No results for input(s): VITAMINB12, FOLATE, FERRITIN, TIBC, IRON, RETICCTPCT in the last 72 hours. Sepsis Labs: Recent Labs  Lab 03/03/20 0744  PROCALCITON 0.10    Recent Results (from the past 240 hour(s))  MRSA PCR Screening     Status: None   Collection Time: 03/02/20  5:20 PM   Specimen: Nasal Mucosa; Nasopharyngeal  Result Value Ref Range Status   MRSA by PCR NEGATIVE NEGATIVE Final    Comment:        The GeneXpert MRSA Assay (FDA approved for NASAL specimens only), is one component of a comprehensive MRSA colonization surveillance program. It is not intended to diagnose MRSA infection nor to guide or monitor treatment for MRSA infections. Performed at [facility], [address] [place], [place] [address]       Radiology Studies: DG Abd Portable 1V  Result Date: 03/04/2020 CLINICAL DATA:  NG tube placement EXAM: PORTABLE ABDOMEN - 1 VIEW COMPARISON:  March 04, 2020 FINDINGS:  The enteric tube projects over the gastric body. There is gaseous distention of loops of bowel throughout the abdomen, favored to represent mostly colon. There is no definite pneumatosis or free air involving the visualized portions of the abdomen. IMPRESSION: Enteric tube projects over the gastric body. Electronically Signed   By: Katherine Mantle M.D.   On: 03/04/2020 02:21   DG Abd Portable 1V  Result Date: 03/04/2020 CLINICAL DATA:  Abdominal distension and pain EXAM: PORTABLE ABDOMEN - 1 VIEW COMPARISON:  None. FINDINGS: Gas distended colon. Gas throughout the small bowel without visible dilatation. Bibasilar interstitial opacities. IMPRESSION: Gas distended colon and small bowel. No visible small bowel dilatation. Electronically Signed   By: Deatra Robinson M.D.   On: 03/04/2020 00:48   ECHOCARDIOGRAM COMPLETE  Result Date: 03/04/2020    ECHOCARDIOGRAM REPORT   Patient Name:   Christina Vincent Date of Exam: 03/04/2020 Medical Rec #:  [id number]      Height:       68.0 in Accession #:    [id number]     Weight:       218.0 lb Date of Birth:  18-Oct-1960       BSA:          2.120 m Patient Age:    59 years       BP:           99/53 mmHg Patient Gender: F              HR:           122 bpm. Exam Location:  Inpatient Procedure: 2D Echo, Color Doppler, Cardiac Doppler and Intracardiac            Opacification Agent Indications:    Acutre respiratory distress R06.03  History:        Patient has no prior history of Echocardiogram examinations.                 Risk Factors:Dyslipidemia.  Sonographer:    Eulah Pont RDCS Referring Phys: [id number] MURALI RAMASWAMY IMPRESSIONS  1. Technically difficult study; definity used.  2. Left ventricular ejection fraction, by estimation, is 60 to 65%. The left ventricle has normal function. The left ventricle has no regional wall motion abnormalities. Left ventricular diastolic parameters are consistent with Grade I diastolic dysfunction (impaired relaxation).  3. Right ventricular  systolic function is normal. The right ventricular size is normal. Tricuspid regurgitation signal is inadequate for assessing PA pressure.  4. The mitral valve is normal in structure. No evidence of mitral valve regurgitation. No evidence of mitral stenosis.  5. The aortic valve is normal in structure. Aortic valve regurgitation is not visualized. No aortic stenosis is present.  6. The inferior vena cava is normal in size with greater than 50% respiratory variability, suggesting right atrial pressure of 3 mmHg. FINDINGS  Left Ventricle: Left ventricular ejection fraction, by estimation, is 60 to 65%. The left ventricle has normal function. The left ventricle has no regional wall motion abnormalities. Definity contrast agent was given IV to delineate the left ventricular  endocardial borders. The left ventricular internal cavity size was normal in size. There is no left ventricular hypertrophy. Left ventricular diastolic parameters are consistent with Grade I diastolic dysfunction (impaired relaxation). Right Ventricle: The right ventricular size is normal. Right ventricular systolic function is normal. Tricuspid regurgitation signal is inadequate for assessing PA pressure. The tricuspid regurgitant velocity is 1.40 m/s, and with an assumed right atrial  pressure of 3 mmHg, the estimated right ventricular systolic pressure is 10.8 mmHg. Left Atrium: Left atrial size was normal in size. Right Atrium: Right atrial size was normal in size. Pericardium: There is no evidence of pericardial effusion. Mitral Valve: The mitral valve is normal in structure. No evidence of mitral valve regurgitation. No evidence of mitral valve stenosis. Tricuspid Valve: The tricuspid valve is normal in structure. Tricuspid valve regurgitation is trivial. No evidence of tricuspid stenosis. Aortic Valve: The aortic valve is normal in structure. Aortic valve regurgitation is not visualized. No aortic stenosis is present. Pulmonic Valve: The  pulmonic valve was not well visualized. Pulmonic valve regurgitation is not visualized. No evidence of pulmonic stenosis. Aorta: The aortic root is normal in size and structure. Venous: The inferior vena cava is normal in size with greater than 50% respiratory variability, suggesting right atrial pressure of 3 mmHg.  Additional Comments: Technically difficult study; definity used.  LEFT VENTRICLE PLAX 2D LVIDd:         3.10 cm  Diastology LVIDs:         2.20 cm  LV e' medial:    4.35 cm/s LV PW:         1.10 cm  LV E/e' medial:  12.4 LV IVS:        1.10 cm  LV e' lateral:   6.74 cm/s LVOT diam:     1.90 cm  LV E/e' lateral: 8.0 LV SV:         35 LV SV Index:   16 LVOT Area:     2.84 cm  RIGHT VENTRICLE TAPSE (M-mode): 1.2 cm LEFT ATRIUM             Index       RIGHT ATRIUM           Index LA diam:        2.40 cm 1.13 cm/m  RA Area:     10.80 cm LA Vol (A2C):   22.3 ml 10.52 ml/m RA Volume:   20.70 ml  9.76 ml/m LA Vol (A4C):   22.5 ml 10.61 ml/m LA Biplane Vol: 23.9 ml 11.27 ml/m  AORTIC VALVE LVOT Vmax:   90.00 cm/s LVOT Vmean:  63.600 cm/s LVOT VTI:    0.122 m  AORTA Ao Root diam: 3.50 cm Ao Asc diam:  3.10 cm MITRAL VALVE               TRICUSPID VALVE MV Area (PHT): 2.43 cm  TR Peak grad:   7.8 mmHg MV Decel Time: 312 msec    TR Vmax:        140.00 cm/s MV E velocity: 53.80 cm/s MV A velocity: 83.40 cm/s  SHUNTS MV E/A ratio:  0.65        Systemic VTI:  0.12 m                            Systemic Diam: 1.90 cm Olga Millers MD Electronically signed by Olga Millers MD Signature Date/Time: 03/04/2020/4:38:19 PM    Final       Scheduled Meds: . albuterol  2.5 mg Nebulization Q6H  . arformoterol  15 mcg Nebulization BID  . vitamin C  500 mg Oral Daily  . benzonatate  200 mg Oral BID  . budesonide  0.5 mg Nebulization BID  . chlorhexidine  15 mL Mouth Rinse BID  . Chlorhexidine Gluconate Cloth  6 each Topical Daily  . enoxaparin (LOVENOX) injection  100 mg Subcutaneous BID  . insulin aspart   0-20 Units Subcutaneous TID WC  . mouth rinse  15 mL Mouth Rinse q12n4p  . methylPREDNISolone (SOLU-MEDROL) injection  60 mg Intravenous Q12H  . mirtazapine  15 mg Oral QHS  . traZODone  50 mg Oral QHS  . zinc sulfate  220 mg Oral Daily   Continuous Infusions:   LOS: 29 days      Time spent: 30 minutes   Noralee Stain, DO [facility] 03/05/2020, 1:18 PM   Available via Epic secure chat 7am-7pm After these hours, please refer to coverage provider listed on [email or web address]

## 2020-03-05 NOTE — Progress Notes (Signed)
Patient/family declining BiPAP at this time. Patient is not in distress at this time and NG in place. Family encouraged to call if patient WOB increases and feels need for BiPAP. RT will continue to monitor patient.

## 2020-03-05 NOTE — TOC Progression Note (Signed)
Transition of Care [facility]) - Progression Note    Patient Details  Name: Christina Vincent MRN: [id number] Date of Birth: 1960/07/26  Transition of Care [facility]) CM/SW Contact  Golda Acre, RN Phone Number: 03/05/2020, 9:01 AM  Clinical Narrative:    Long goals of care discussed at bedside with the patient, her husband, and extended family.  Ultimately they decided to continue w/ Current care and NOT to proceed w/ intubation.  Plan Full DNR Cont supportive measures PRN morphine for air hunger   They understand that at this point if she worsens we would need to transition to comfort and are in agreement with this  PLAN: follow to see if becomes comfort care.  Expected Discharge Plan: Home/Self Care    Expected Discharge Plan and Services Expected Discharge Plan: Home/Self Care   Discharge Planning Services: CM Consult   Living arrangements for the past 2 months: Single Family Home                                       Social Determinants of Health (SDOH) Interventions    Readmission Risk Interventions No flowsheet data found.

## 2020-03-06 ENCOUNTER — Inpatient Hospital Stay (HOSPITAL_COMMUNITY): Payer: 59

## 2020-03-06 LAB — COMPREHENSIVE METABOLIC PANEL
ALT: 27 U/L (ref 0–44)
AST: 20 U/L (ref 15–41)
Albumin: 3.1 g/dL — ABNORMAL LOW (ref 3.5–5.0)
Alkaline Phosphatase: 75 U/L (ref 38–126)
Anion gap: 14 (ref 5–15)
BUN: 25 mg/dL — ABNORMAL HIGH (ref 6–20)
CO2: 45 mmol/L — ABNORMAL HIGH (ref 22–32)
Calcium: 8.9 mg/dL (ref 8.9–10.3)
Chloride: 82 mmol/L — ABNORMAL LOW (ref 98–111)
Creatinine, Ser: 0.58 mg/dL (ref 0.44–1.00)
GFR, Estimated: >60 mL/min (ref >60)
Glucose, Bld: 278 mg/dL — ABNORMAL HIGH (ref 70–99)
Potassium: 3.9 mmol/L (ref 3.5–5.1)
Sodium: 141 mmol/L (ref 135–145)
Total Bilirubin: 0.6 mg/dL (ref 0.3–1.2)
Total Protein: 7.4 g/dL (ref 6.5–8.1)

## 2020-03-06 LAB — CBC
HCT: 35.7 % — ABNORMAL LOW (ref 36.0–46.0)
Hemoglobin: 10.5 g/dL — ABNORMAL LOW (ref 12.0–15.0)
MCH: 28.5 pg (ref 26.0–34.0)
MCHC: 29.4 g/dL — ABNORMAL LOW (ref 30.0–36.0)
MCV: 97 fL (ref 80.0–100.0)
Platelets: 455 10*3/uL — ABNORMAL HIGH (ref 150–400)
RBC: 3.68 MIL/uL — ABNORMAL LOW (ref 3.87–5.11)
RDW: 15.2 % (ref 11.5–15.5)
WBC: 10.7 10*3/uL — ABNORMAL HIGH (ref 4.0–10.5)
nRBC: 0.3 % — ABNORMAL HIGH (ref 0.0–0.2)

## 2020-03-06 LAB — GLUCOSE, CAPILLARY
Glucose-Capillary: 213 mg/dL — ABNORMAL HIGH (ref 70–99)
Glucose-Capillary: 234 mg/dL — ABNORMAL HIGH (ref 70–99)
Glucose-Capillary: 153 mg/dL — ABNORMAL HIGH (ref 70–99)
Glucose-Capillary: 243 mg/dL — ABNORMAL HIGH (ref 70–99)

## 2020-03-06 LAB — MAGNESIUM: Magnesium: 2.6 mg/dL — ABNORMAL HIGH (ref 1.7–2.4)

## 2020-03-06 LAB — PHOSPHORUS: Phosphorus: 3.9 mg/dL (ref 2.5–4.6)

## 2020-03-06 MED ORDER — ADULT MULTIVITAMIN W/MINERALS CH
1.0000 | ORAL_TABLET | Freq: Every day | ORAL | Status: DC
  Administered 2020-03-06 – 2020-03-21 (×16): 1 via ORAL
  Filled 2020-03-06 (×16): qty 1

## 2020-03-06 MED ORDER — BOOST / RESOURCE BREEZE PO LIQD CUSTOM: 1.0000 | Freq: Two times a day (BID) | ORAL | Status: DC

## 2020-03-06 NOTE — Progress Notes (Signed)
PROGRESS NOTE    Christina Vincent  FGB:[id number] DOB: [birth year] DOA: 02/04/2020 PCP: Avon Gully, MD     Brief Narrative:  Christina Vincent is a 60 y.o. female with a history of T2DM, HTN, HLD, obesity and depression who presented with fever and dyspnea found to have positive SARS-CoV-2 PCR and mild hypoxia despite clear CXR on 12/27. CRP 16.6, PCT negative. Full scope treatment commenced with subsequent worsening of hypoxia. CTA chest 1/4 revealed worsening airspace disease compatible with progressive covid pneumonia as well as acute bilateral pulmonary emboli without right heart strain. Severe hypoxemia continues. Isolation period has been completed while still hospitalized. CXR repeated 1/13, 1/18 and 1/20 have continued to demonstrate significant heterogenous opacities bilaterally. Psychiatry was consulted for medical management of concomitant anxiety. PCCM consulted for further recommendations 02/28/2020. On 1/23 she became lethargic and was noted to be severely hypercarbic, placed on BiPAP and transferred to ICU. Plan to continue BiPAP and goals of care discussions.  After discussion with family, patient has elected for DNR status but continue full scope of medical care.  New events last 24 hours / Subjective: States that she is not feeling well, unable to elaborate specific complaints.  Asking for water.  Denies any worsening breathing.  Admits to passing gas, bowel movement.  Had a fever yesterday 100.4  Assessment & Plan:   Principal Problem:   Acute hypoxemic respiratory failure due to COVID-19 [facility]) Active Problems:   2019 novel coronavirus disease (COVID-19)   Hypertension   Type 2 diabetes mellitus (HCC)   Dyslipidemia   Depression   Class 1 obesity   Pulmonary emboli (HCC)   Acute hypoxemic respiratory failure secondary to COVID-19  -Continue 8 L O2, wean as able.  Continue BiPAP nightly and intermittently through the day as recommended by PCCM -Patient remains  DNI  COVID-19 Pneumonia -Tested positive on 02/04/2020.  Off isolation -Completed full course of Remdesivir, steroid, baricitinib -Supportive treatments as ordered, encourage IS, encourage prone positioning, encourage mobilization  -Restarted on steroids due to continued respiratory failure -Continue Pulmicort, Brovana, albuterol  Acute PE -Continue Lovenox  Diabetes mellitus type 2 -Continue sliding scale insulin  Anxiety -Appreciate psychiatry -Continue Xanax as needed  Ileus -Abdominal x-ray revealed gas distended distended colon and small bowel without visible small bowel dilatation.  NG tube placed -NG output 800 mL yesterday  -Repeat abdominal x-ray with normal bowel gas pattern.  Abdomen remains soft and nontender.  Clamp NG tube today  Obesity -Estimated body mass index is 33.15 kg/m as calculated from the following:   Height as of this encounter: 5\' 8"  (1.727 m).   Weight as of this encounter: 98.9 kg.  Abnormal TSH -Repeat as outpatient in 4 to 6 weeks   DVT prophylaxis: Lovenox   Code Status: DNR Family Communication: No family at bedside Disposition Plan:  Status is: Inpatient  Remains inpatient appropriate because:Hemodynamically unstable   Dispo:  Patient From: Home  Planned Disposition: To be determined  Expected discharge date: > 3 days  Medically stable for discharge: No.  Remains on high levels of oxygen today        Consultants:   PCCM  Psychiatry  Antimicrobials:  Anti-infectives (From admission, onward)   Start     Dose/Rate Route Frequency Ordered Stop   02/05/20 1600  remdesivir 100 mg in sodium chloride 0.9 % 100 mL IVPB        100 mg 200 mL/hr over 30 Minutes Intravenous Daily 02/04/20 2245 02/08/20 1055  02/04/20 2300  remdesivir 100 mg in sodium chloride 0.9 % 100 mL IVPB        100 mg 200 mL/hr over 30 Minutes Intravenous Every 30 min 02/04/20 2245 02/05/20 0129       Objective: Vitals:   03/06/20 0355 03/06/20  0615 03/06/20 0745 03/06/20 1001  BP: 129/82 114/73  116/75  Pulse: (!) 107 99  (!) 111  Resp: 17 20  15   Temp: 99.9 F (37.7 C) 97.7 F (36.5 C)  98 F (36.7 C)  TempSrc: Axillary Oral  Oral  SpO2: 99% 96% 95% 95%  Weight:      Height:        Intake/Output Summary (Last 24 hours) at 03/06/2020 1341 Last data filed at 03/06/2020 0440 Gross per 24 hour  Intake 60 ml  Output 1850 ml  Net -1790 ml   Filed Weights   02/04/20 1838  Weight: 98.9 kg    Examination: General exam: Appears calm and comfortable  Respiratory system: Diminished breath sounds. Respiratory effort normal.  On 8 L oxygen Cardiovascular system: S1 & S2 heard, RRR. No pedal edema. Gastrointestinal system: Abdomen is nondistended, soft and nontender. Normal bowel sounds heard. Central nervous system: Alert. Non focal exam. Extremities: Symmetric in appearance bilaterally  Skin: No rashes, lesions or ulcers on exposed skin   Data Reviewed: I have personally reviewed following labs and imaging studies  CBC: Recent Labs  Lab 03/02/20 1703 03/03/20 0744 03/04/20 0308 03/05/20 0305 03/06/20 0254  WBC 7.8 8.1 10.9* 10.0 10.7*  NEUTROABS 6.7 7.3 9.6* 8.5*  --   HGB 11.2* 10.2* 10.9* 10.8* 10.5*  HCT 38.6 34.2* 36.7 36.6 35.7*  MCV 98.0 95.5 95.3 96.6 97.0  PLT 409* 428* 488* 493* 455*   Basic Metabolic Panel: Recent Labs  Lab 03/02/20 1703 03/03/20 0744 03/04/20 0308 03/05/20 0305 03/06/20 0254  NA 139 140 139 144 141  K 4.9 5.0 3.8 4.0 3.9  CL 88* 88* 87* 84* 82*  CO2 42* 40* 36* 45* 45*  GLUCOSE 93 186* 250* 237* 278*  BUN 11 20 24* 31* 25*  CREATININE 0.58 0.51 0.66 0.62 0.58  CALCIUM 9.2 9.0 8.9 9.2 8.9  MG  --   --  2.4  --  2.6*  PHOS  --   --  4.6 4.3 3.9   GFR: Estimated Creatinine Clearance: 95.4 mL/min (by C-G formula based on SCr of 0.58 mg/dL). Liver Function Tests: Recent Labs  Lab 03/02/20 1703 03/06/20 0254  AST 28 20  ALT 37 27  ALKPHOS 92 75  BILITOT 0.4 0.6  PROT  8.1 7.4  ALBUMIN 2.9* 3.1*   No results for input(s): LIPASE, AMYLASE in the last 168 hours. No results for input(s): AMMONIA in the last 168 hours. Coagulation Profile: No results for input(s): INR, PROTIME in the last 168 hours. Cardiac Enzymes: No results for input(s): CKTOTAL, CKMB, CKMBINDEX, TROPONINI in the last 168 hours. BNP (last 3 results) No results for input(s): PROBNP in the last 8760 hours. HbA1C: No results for input(s): HGBA1C in the last 72 hours. CBG: Recent Labs  Lab 03/05/20 1544 03/05/20 1712 03/05/20 2303 03/06/20 0813 03/06/20 1131  GLUCAP 203* 189* 231* 213* 234*   Lipid Profile: No results for input(s): CHOL, HDL, LDLCALC, TRIG, CHOLHDL, LDLDIRECT in the last 72 hours. Thyroid Function Tests: No results for input(s): TSH, T4TOTAL, FREET4, T3FREE, THYROIDAB in the last 72 hours. Anemia Panel: No results for input(s): VITAMINB12, FOLATE, FERRITIN, TIBC, IRON, RETICCTPCT in the  last 72 hours. Sepsis Labs: Recent Labs  Lab 03/03/20 0744  PROCALCITON 0.10    Recent Results (from the past 240 hour(s))  MRSA PCR Screening     Status: None   Collection Time: 03/02/20  5:20 PM   Specimen: Nasal Mucosa; Nasopharyngeal  Result Value Ref Range Status   MRSA by PCR NEGATIVE NEGATIVE Final    Comment:        The GeneXpert MRSA Assay (FDA approved for NASAL specimens only), is one component of a comprehensive MRSA colonization surveillance program. It is not intended to diagnose MRSA infection nor to guide or monitor treatment for MRSA infections. Performed at [facility], [address] [place], [place] [address]       Radiology Studies: DG Abd 1 View  Result Date: 03/06/2020 CLINICAL DATA:  Follow-up ileus EXAM: ABDOMEN - 1 VIEW COMPARISON:  03/04/2020 FINDINGS: Nasogastric tube tip in the fundus of the stomach. Small bowel pattern is normal. Ordinary colonic gas pattern, with some gas in the transverse colon, often seen in  debilitated supine patients. IMPRESSION: Nasogastric tube tip in the fundus of the stomach. Unremarkable bowel gas pattern. See above. Electronically Signed   By: Paulina Fusi M.D.   On: 03/06/2020 08:34   ECHOCARDIOGRAM COMPLETE  Result Date: 03/04/2020    ECHOCARDIOGRAM REPORT   Patient Name:   PIXIE BURGENER Date of Exam: 03/04/2020 Medical Rec #:  [id number]      Height:       68.0 in Accession #:    [id number]     Weight:       218.0 lb Date of Birth:  1960-05-02       BSA:          2.120 m Patient Age:    59 years       BP:           99/53 mmHg Patient Gender: F              HR:           122 bpm. Exam Location:  Inpatient Procedure: 2D Echo, Color Doppler, Cardiac Doppler and Intracardiac            Opacification Agent Indications:    Acutre respiratory distress R06.03  History:        Patient has no prior history of Echocardiogram examinations.                 Risk Factors:Dyslipidemia.  Sonographer:    Eulah Pont RDCS Referring Phys: [id number] MURALI RAMASWAMY IMPRESSIONS  1. Technically difficult study; definity used.  2. Left ventricular ejection fraction, by estimation, is 60 to 65%. The left ventricle has normal function. The left ventricle has no regional wall motion abnormalities. Left ventricular diastolic parameters are consistent with Grade I diastolic dysfunction (impaired relaxation).  3. Right ventricular systolic function is normal. The right ventricular size is normal. Tricuspid regurgitation signal is inadequate for assessing PA pressure.  4. The mitral valve is normal in structure. No evidence of mitral valve regurgitation. No evidence of mitral stenosis.  5. The aortic valve is normal in structure. Aortic valve regurgitation is not visualized. No aortic stenosis is present.  6. The inferior vena cava is normal in size with greater than 50% respiratory variability, suggesting right atrial pressure of 3 mmHg. FINDINGS  Left Ventricle: Left ventricular ejection fraction, by estimation, is 60  to 65%. The left ventricle has normal function. The left ventricle has no regional  wall motion abnormalities. Definity contrast agent was given IV to delineate the left ventricular  endocardial borders. The left ventricular internal cavity size was normal in size. There is no left ventricular hypertrophy. Left ventricular diastolic parameters are consistent with Grade I diastolic dysfunction (impaired relaxation). Right Ventricle: The right ventricular size is normal. Right ventricular systolic function is normal. Tricuspid regurgitation signal is inadequate for assessing PA pressure. The tricuspid regurgitant velocity is 1.40 m/s, and with an assumed right atrial  pressure of 3 mmHg, the estimated right ventricular systolic pressure is 10.8 mmHg. Left Atrium: Left atrial size was normal in size. Right Atrium: Right atrial size was normal in size. Pericardium: There is no evidence of pericardial effusion. Mitral Valve: The mitral valve is normal in structure. No evidence of mitral valve regurgitation. No evidence of mitral valve stenosis. Tricuspid Valve: The tricuspid valve is normal in structure. Tricuspid valve regurgitation is trivial. No evidence of tricuspid stenosis. Aortic Valve: The aortic valve is normal in structure. Aortic valve regurgitation is not visualized. No aortic stenosis is present. Pulmonic Valve: The pulmonic valve was not well visualized. Pulmonic valve regurgitation is not visualized. No evidence of pulmonic stenosis. Aorta: The aortic root is normal in size and structure. Venous: The inferior vena cava is normal in size with greater than 50% respiratory variability, suggesting right atrial pressure of 3 mmHg.  Additional Comments: Technically difficult study; definity used.  LEFT VENTRICLE PLAX 2D LVIDd:         3.10 cm  Diastology LVIDs:         2.20 cm  LV e' medial:    4.35 cm/s LV PW:         1.10 cm  LV E/e' medial:  12.4 LV IVS:        1.10 cm  LV e' lateral:   6.74 cm/s LVOT diam:      1.90 cm  LV E/e' lateral: 8.0 LV SV:         35 LV SV Index:   16 LVOT Area:     2.84 cm  RIGHT VENTRICLE TAPSE (M-mode): 1.2 cm LEFT ATRIUM             Index       RIGHT ATRIUM           Index LA diam:        2.40 cm 1.13 cm/m  RA Area:     10.80 cm LA Vol (A2C):   22.3 ml 10.52 ml/m RA Volume:   20.70 ml  9.76 ml/m LA Vol (A4C):   22.5 ml 10.61 ml/m LA Biplane Vol: 23.9 ml 11.27 ml/m  AORTIC VALVE LVOT Vmax:   90.00 cm/s LVOT Vmean:  63.600 cm/s LVOT VTI:    0.122 m  AORTA Ao Root diam: 3.50 cm Ao Asc diam:  3.10 cm MITRAL VALVE               TRICUSPID VALVE MV Area (PHT): 2.43 cm    TR Peak grad:   7.8 mmHg MV Decel Time: 312 msec    TR Vmax:        140.00 cm/s MV E velocity: 53.80 cm/s MV A velocity: 83.40 cm/s  SHUNTS MV E/A ratio:  0.65        Systemic VTI:  0.12 m                            Systemic Diam: 1.90 cm Arlys JohnBrian  Crenshaw MD Electronically signed by Olga Millers MD Signature Date/Time: 03/04/2020/4:38:19 PM    Final       Scheduled Meds: . albuterol  2.5 mg Nebulization Q6H  . arformoterol  15 mcg Nebulization BID  . vitamin C  500 mg Oral Daily  . benzonatate  200 mg Oral BID  . budesonide  0.5 mg Nebulization BID  . chlorhexidine  15 mL Mouth Rinse BID  . Chlorhexidine Gluconate Cloth  6 each Topical Daily  . enoxaparin (LOVENOX) injection  100 mg Subcutaneous BID  . feeding supplement  1 Container Oral BID BM  . insulin aspart  0-20 Units Subcutaneous TID WC  . mouth rinse  15 mL Mouth Rinse q12n4p  . methylPREDNISolone (SOLU-MEDROL) injection  60 mg Intravenous Q12H  . mirtazapine  15 mg Oral QHS  . multivitamin with minerals  1 tablet Oral Daily  . traZODone  50 mg Oral QHS  . zinc sulfate  220 mg Oral Daily   Continuous Infusions:   LOS: 30 days      Time spent: 30 minutes   Noralee Stain, DO [facility] 03/06/2020, 1:41 PM   Available via Epic secure chat 7am-7pm After these hours, please refer to coverage provider listed on [email or web address]

## 2020-03-06 NOTE — Plan of Care (Signed)
Updated Husband on transfer to 4W Room 40.

## 2020-03-06 NOTE — Plan of Care (Signed)
  Problem: Health Behavior/Discharge Planning: Goal: Ability to manage health-related needs will improve Outcome: Progressing   Problem: Clinical Measurements: Goal: Ability to maintain clinical measurements within normal limits will improve Outcome: Progressing Goal: Will remain free from infection Outcome: Progressing Goal: Diagnostic test results will improve Outcome: Progressing Goal: Respiratory complications will improve Outcome: Progressing Goal: Cardiovascular complication will be avoided Outcome: Progressing   Problem: Activity: Goal: Risk for activity intolerance will decrease Outcome: Progressing   Problem: Coping: Goal: Level of anxiety will decrease Outcome: Progressing   Problem: Elimination: Goal: Will not experience complications related to bowel motility Outcome: Progressing Goal: Will not experience complications related to urinary retention Outcome: Progressing   Problem: Pain Managment: Goal: General experience of comfort will improve Outcome: Progressing   Problem: Safety: Goal: Ability to remain free from injury will improve Outcome: Progressing   Problem: Skin Integrity: Goal: Risk for impaired skin integrity will decrease Outcome: Progressing   Problem: Education: Goal: Knowledge of risk factors and measures for prevention of condition will improve Outcome: Progressing   Problem: Coping: Goal: Psychosocial and spiritual needs will be supported Outcome: Progressing   Problem: Respiratory: Goal: Will maintain a patent airway Outcome: Progressing Goal: Complications related to the disease process, condition or treatment will be avoided or minimized Outcome: Progressing   

## 2020-03-06 NOTE — TOC Progression Note (Signed)
Transition of Care [facility]) - Progression Note    Patient Details  Name: Christina Vincent MRN: [id number] Date of Birth: 1960/12/19  Transition of Care [facility]) CM/SW Contact  Geni Bers, RN Phone Number: 03/06/2020, 12:44 PM  Clinical Narrative:     Pt on 15L O2 and has anxiety. Pt was on 4W COVID+ transferred to ICU, now back on 4W. Pt continues to wear a face mask.  TOC will continue to follow pt for discharge plan.   Expected Discharge Plan: Home/Self Care    Expected Discharge Plan and Services Expected Discharge Plan: Home/Self Care   Discharge Planning Services: CM Consult   Living arrangements for the past 2 months: Single Family Home                                       Social Determinants of Health (SDOH) Interventions    Readmission Risk Interventions No flowsheet data found.

## 2020-03-06 NOTE — Progress Notes (Signed)
Initial Nutrition Assessment  DOCUMENTATION CODES:   Not applicable  INTERVENTION:  - will order Boost Breeze po BID, each supplement provides 250 kcal and 9 grams of protein. - will order 1 tablet multivitamin with minerals/day. - re-weigh patient as she has not been weighed since admission on 12/27.  - complete NFPE when feasible.   NUTRITION DIAGNOSIS:   Increased nutrient needs related to catabolic illness (COVID-19) as evidenced by estimated needs.  GOAL:   Patient will meet greater than or equal to 90% of their needs  MONITOR:   PO intake,Supplement acceptance,Labs,Weight trends  REASON FOR ASSESSMENT:   LOS  ASSESSMENT:   60 y.o. female with medical history of type 2 DM, HTN, HLD, obesity, and depression. She presented to the ED with fever and dyspnea. She was found to be COVID-19 positive on 02/04/20. CT angio chest on  showed worsening airspace disease compatible with progressive COVID PNA and acute bilateral pulmonary emboli. CXR 1/13, 1/18, and 1/20 showed significant bilateral opacities. On 1/23 she was lethargic and severely hypercarbic so she was placed on BiPAP and transferred to the ICU. Patient is now DNR.   Patient started on CLD today at 0900. Patient was previously consuming 0-25% of meals on Regular diet.   NGT placed in R nare on 1/25 and there is ~200 ml dark brown output in canister at the time of RD visit this afternoon.  Able to talk with RN who reports that patient informed her that she was unable to consume pancakes for breakfast this AM and that after that time, diet was changed to CLD. Patient felt she could consume jello so this was provided by RN at which time patient then stated she was unable to eat it.   RN reports NGT is clamped. Will monitor for ability to remove NGT.  Patient laying in bed with no family or visitors present at this time. Patient's engagement with RD is very limited and no pertinent nutrition-related information was able  to be obtained.   She has not been weighed since admission date of 12/27. At that time, she weighed 218 lb. Prior to that, the most recently documented weights were on 12/28/19 when she weighed 214 lb and on 11/28/19 when she also weighed 214 lb.   Per notes: - DNR/DNI - BiPAP at night and PRN during the day - self proning and ambulation being encouraged - ileus with abdominal xray showing gas distended colon and small bowel   Labs reviewed; CBG: 213 mg/dl, Cl: 82 mmol/l, BUN: 25 mg/dl, Mg: 2.6 mg/dl.  Medications reviewed; 500 mg ascorbic acid/day, sliding scale novolog, 60 mg solu-medrol BID, 220 mg zinc sulfate/day.     NUTRITION - FOCUSED PHYSICAL EXAM:  deferred  Diet Order:   Diet Order            Diet clear liquid Room service appropriate? Yes; Fluid consistency: Thin  Diet effective now                 EDUCATION NEEDS:   Not appropriate for education at this time  Skin:  Skin Assessment: Reviewed RN Assessment  Last BM:  1/25 (type 4)  Height:   Ht Readings from Last 1 Encounters:  02/04/20 5\' 8"  (1.727 m)    Weight:   Wt Readings from Last 1 Encounters:  02/04/20 98.9 kg     Estimated Nutritional Needs:  Kcal:  2300-2500 kcal Protein:  120-135 grams Fluid:  >/= 2.5 L/day      02/06/20  Ayeshia Coppin, MS, RD, LDN, CNSC Inpatient Clinical Dietitian RD pager # available in AMION  After hours/weekend pager # available in [facility]

## 2020-03-07 LAB — CBC
HCT: 34.7 % — ABNORMAL LOW (ref 36.0–46.0)
Hemoglobin: 10.3 g/dL — ABNORMAL LOW (ref 12.0–15.0)
MCH: 28.3 pg (ref 26.0–34.0)
MCHC: 29.7 g/dL — ABNORMAL LOW (ref 30.0–36.0)
MCV: 95.3 fL (ref 80.0–100.0)
Platelets: 439 10*3/uL — ABNORMAL HIGH (ref 150–400)
RBC: 3.64 MIL/uL — ABNORMAL LOW (ref 3.87–5.11)
RDW: 15.1 % (ref 11.5–15.5)
WBC: 10.3 10*3/uL (ref 4.0–10.5)
nRBC: 0.4 % — ABNORMAL HIGH (ref 0.0–0.2)

## 2020-03-07 LAB — GLUCOSE, CAPILLARY
Glucose-Capillary: 243 mg/dL — ABNORMAL HIGH (ref 70–99)
Glucose-Capillary: 244 mg/dL — ABNORMAL HIGH (ref 70–99)
Glucose-Capillary: 286 mg/dL — ABNORMAL HIGH (ref 70–99)

## 2020-03-07 MED ORDER — METHYLPREDNISOLONE SODIUM SUCC 125 MG IJ SOLR
50.0000 mg | Freq: Every day | INTRAMUSCULAR | Status: DC
  Administered 2020-03-08: 50 mg via INTRAVENOUS
  Filled 2020-03-07: qty 2

## 2020-03-07 NOTE — Progress Notes (Addendum)
Husband Major given update on the events overnight.  He is aware that pt has repeatedly taken off bipap throughout the night.  He is also aware that although O2 sats decreased that pt recovered well considering limited lung function.  Husband discussed the possibility of pt coming home on possible 8liters HFNC.  Remained realistic with husband and stated that we can take it one day at time but that overall she had an ok night.  Pt was taken off the bipap around 0430-0500 because pt became aggravated.  Once bipap removed and HFNC put back on; the pt is more at peace and is resting well.  Will communicate happenings of the evening to am nurse and husband is also made aware as well.

## 2020-03-07 NOTE — Progress Notes (Addendum)
   03/07/20 0100  Oxygen Therapy  O2 Device Bi-PAP  FiO2 (%) 50 %  Patient Activity (if Appropriate) In bed  Pulse Oximetry Type Continuous  Pt took off bipap.  O2 sats decreased to 70's.  Went to pt room and discussed importance of keeping bipap on during the night.  Pt recovered relatively quickly with sats at 84% and then increasing to 90%.  Pt verbalized understanding.  Pt states that it is too much.  Emotional support given to pt and all medications were given to pt prior to placement of bipap on pt.

## 2020-03-07 NOTE — Progress Notes (Signed)
   03/06/20 2030  Oxygen Therapy  SpO2 95 %  O2 Device HFNC  O2 Flow Rate (L/min) 8 L/min  Patient Activity (if Appropriate) In bed  Pulse Oximetry Type Continuous  Husband and daughter at bedside.  Had numerous questions about recovery and wanted to know percentage of viable lung tissue.  Questions are best suited for pulmonologist/hospitalist on duty.  Informed that pt is doing better but due to condition of lungs recovery may be longer than expected. No dates of exactly when recovery will be completed but taking a day at a time and keeping the pt as calm and engaged in care as possible.  RT on floor and spoke to the family about bipap in relation to current lung status.  Husband inquired about medications for rest and sleep. Informed that medication will be given as scheduled.  All other question answered within scope of practice. Gave family number so that they could call for updates.  Otherwise no other questions at this time.  Will continue to monitor and note changes in pt condition.

## 2020-03-07 NOTE — Progress Notes (Signed)
Occupational Therapy Treatment Patient Details Name: Christina Vincent MRN: [id number] DOB: Jun 09, 1960 Today's Date: 03/07/2020    History of present illness Pt is 60 y.o. female with medical history significant of depression, dyslipidemia, type 2 diabetes, class I obesity, hypertension who is coming to the emergency department due to progressively worse shortness of breath for the past 6 days associated with fever, chills, fatigue, malaise, sore throat, occasionally productive of whitish/clear dry cough and rhinorrhea.  Patient is unvaccinated and has been admitted for acute hypoxemic respiratory failure secondary to COVID-19.  Pt also found to have PE 02/12/20. Patient became lethargic 1/23 and transferred to ICU - almost intubated but family opted for Do Not Intubate and bipap was used instead. Patient currently in progressive care on 7 L HFNC.   OT comments  Patient required transfer to ICU but no significant medical changes occurred and patient now back in progressive care. Currently patient on 7 L HFNC supine in bed. Patient's o2 sat dropped to 57% with sitting up in bed and patient anxious and emotional wanting to lay back down. Patient's o2 increased to 15 L HFNC and patient recovered within 2 min to 90%. With the assistance of patient's husband and daughter patient transferred to recliner - o2 sat dropped to 67% and once again recovered back to 90% within 2 minutes. Patient continues to tolerate minimal activity. Discharge planning difficulty. Family wants to take patient home but patient continues to require significant oxygenation and may require LTACH or SNF at discharge. Will downgrade goals more suitable to patient's current level. Rehab potential guarded at this time.   Follow Up Recommendations  SNF;LTACH;Home health OT    Equipment Recommendations  3 in 1 bedside commode;Tub/shower seat    Recommendations for Other Services      Precautions / Restrictions Precautions Precautions:  Fall Precaution Comments: monitor sats, anxious and emotional with activity, Increase to 15 L HFNC for activity Restrictions Weight Bearing Restrictions: No       Mobility Bed Mobility Overal bed mobility: Needs Assistance Bed Mobility: Supine to Sit;Sit to Supine     Supine to sit: Mod assist;HOB elevated Sit to supine: Min assist   General bed mobility comments: MOd assist for tactile cues to initiate movement in legs and trunk lift off. Once into partial sitting patient became anxious and emotional - crying and trying to lay back down. Patienton 7 L HFNC and dropped to 57% with minimal activity. Therapist supporting patient in sitting initially - with patient's o2 sat dropping patient laid on her side to recover. O2 increased to 15 L and patient recovered within 2 minutes to the low 90s.  Transfers Overall transfer level: Needs assistance Equipment used: 2 person hand held assist Transfers: Sit to/from [employer] Sit to Stand: Mod assist;+2 safety/equipment Stand pivot transfers: Mod assist;+2 safety/equipment       General transfer comment: With the assistance of patient's husband for encouargment and +2 assistance patient mod assist to stand and take steps to recliner. Once again o2 sat dropped quickly to  67% but recovered within minutes to 90%. Patient's oxygen reduced to 10 L.    Balance Overall balance assessment: Needs assistance Sitting-balance support: Bilateral upper extremity supported Sitting balance-Leahy Scale: Poor Sitting balance - Comments: poor participation - hard to assess true balance   Standing balance support: During functional activity;Bilateral upper extremity supported Standing balance-Leahy Scale: Poor Standing balance comment: 2 hand holds  ADL either performed or assessed with clinical judgement   ADL                               Toileting- Clothing Manipulation and Hygiene:  Total assistance Toileting - Clothing Manipulation Details (indicate cue type and reason): Patient requiring external catheter and total care for pericare/hygiene/clothing management at this time. incontinent of urine during sitting and transfer.             Vision Patient Visual Report: No change from baseline     Perception     Praxis      Cognition Arousal/Alertness: Awake/alert Behavior During Therapy: Anxious;Flat affect Overall Cognitive Status: Impaired/Different from baseline                                 General Comments: Anxiety/emotions still significantly limits treatment. Grossly functional but hard to reason with when anxious.        Exercises     Shoulder Instructions       General Comments      Pertinent Vitals/ Pain       Pain Assessment: Faces Faces Pain Scale: Hurts little more Pain Location: generalized Pain Descriptors / Indicators: Sore Pain Intervention(s): Limited activity within patient's tolerance;Monitored during session  Home Living                                          Prior Functioning/Environment              Frequency           Progress Toward Goals  OT Goals(current goals can now be found in the care plan section)  Progress towards OT goals: Goals drowngraded-see care plan  Acute Rehab OT Goals Patient Stated Goal: to go home OT Goal Formulation: With family Time For Goal Achievement: 03/21/20 Potential to Achieve Goals: Poor  Plan Discharge plan needs to be updated    Co-evaluation                 AM-PAC OT "6 Clicks" Daily Activity     Outcome Measure   Help from another person eating meals?: A Lot Help from another person taking care of personal grooming?: A Lot Help from another person toileting, which includes using toliet, bedpan, or urinal?: Total Help from another person bathing (including washing, rinsing, drying)?: A Lot Help from another person to put  on and taking off regular upper body clothing?: A Lot Help from another person to put on and taking off regular lower body clothing?: Total 6 Click Score: 10    End of Session Equipment Utilized During Treatment: Oxygen  OT Visit Diagnosis: Other (comment);Muscle weakness (generalized) (M62.81)   Activity Tolerance Patient limited by fatigue (o2 sats)   Patient Left     Nurse Communication Mobility status (o2  sats)        Time: 1440-1501 OT Time Calculation (min): 21 min  Charges: OT General Charges $OT Visit: 1 Visit OT Treatments $Therapeutic Activity: 8-22 mins  Dakoda Bassette, OTR/L Acute Care Rehab Services  Office [phone] Pager: [phone]    [name] 03/07/2020, 3:41 PM

## 2020-03-07 NOTE — Progress Notes (Addendum)
   03/07/20 0005  Oxygen Therapy  O2 Device Bi-PAP (16/8)  FiO2 (%) 50 %  Patient Activity (if Appropriate) In bed  Pulse Oximetry Type Continuous  Pt sats decreased to 75%.  Went to the room.  Pt had removed bipap.  Pt educated on importance.  Pt nods head in agreement but then starts to cry.  Pt states it is too much.  Pt requesting to keep 8lnc on at this point.  Pt given emotional support.  Pt looks weary.  After comforting pt; pt was compliant with putting bipap mask on.  O2 sats recovered relatively quickly from 75% to 85% to 92%.  Will continue to monitor changes.

## 2020-03-07 NOTE — Progress Notes (Addendum)
   03/06/20 2300  Oxygen Therapy  SpO2 99 %  O2 Device Bi-PAP (16/8)  FiO2 (%) 50 %  Patient Activity (if Appropriate) In bed  Pulse Oximetry Type Continuous  Pt placed on bipap per RT.  Will continue to monitor.

## 2020-03-07 NOTE — Plan of Care (Signed)
  Problem: Health Behavior/Discharge Planning: Goal: Ability to manage health-related needs will improve Outcome: Progressing   Problem: Clinical Measurements: Goal: Ability to maintain clinical measurements within normal limits will improve Outcome: Progressing Goal: Will remain free from infection Outcome: Progressing Goal: Diagnostic test results will improve Outcome: Progressing Goal: Respiratory complications will improve Outcome: Progressing Goal: Cardiovascular complication will be avoided Outcome: Progressing   Problem: Activity: Goal: Risk for activity intolerance will decrease Outcome: Progressing   Problem: Coping: Goal: Level of anxiety will decrease Outcome: Progressing   Problem: Elimination: Goal: Will not experience complications related to bowel motility Outcome: Progressing Goal: Will not experience complications related to urinary retention Outcome: Progressing   Problem: Pain Managment: Goal: General experience of comfort will improve Outcome: Progressing   Problem: Safety: Goal: Ability to remain free from injury will improve Outcome: Progressing   Problem: Skin Integrity: Goal: Risk for impaired skin integrity will decrease Outcome: Progressing   Problem: Education: Goal: Knowledge of risk factors and measures for prevention of condition will improve Outcome: Progressing   Problem: Coping: Goal: Psychosocial and spiritual needs will be supported Outcome: Progressing   Problem: Respiratory: Goal: Will maintain a patent airway Outcome: Progressing Goal: Complications related to the disease process, condition or treatment will be avoided or minimized Outcome: Progressing

## 2020-03-07 NOTE — Progress Notes (Addendum)
PROGRESS NOTE    Christina Vincent  VOZ:[id number] DOB: December 20, 1960 DOA: 02/04/2020 PCP: Avon Gully, MD     Brief Narrative:  Christina Vincent is a 60 y.o. female with a history of T2DM, HTN, HLD, obesity and depression who presented with fever and dyspnea found to have positive SARS-CoV-2 PCR and mild hypoxia despite clear CXR on 12/27. CRP 16.6, PCT negative. Full scope treatment commenced with subsequent worsening of hypoxia. CTA chest 1/4 revealed worsening airspace disease compatible with progressive covid pneumonia as well as acute bilateral pulmonary emboli without right heart strain. Severe hypoxemia continues. Isolation period has been completed while still hospitalized. CXR repeated 1/13, 1/18 and 1/20 have continued to demonstrate significant heterogenous opacities bilaterally. Psychiatry was consulted for medical management of concomitant anxiety. PCCM consulted for further recommendations 02/28/2020. On 1/23 she became lethargic and was noted to be severely hypercarbic, placed on BiPAP and transferred to ICU. Plan to continue BiPAP and goals of care discussions.  After discussion with family, patient has elected for DNR status but continue full scope of medical care.  New events last 24 hours / Subjective: Patient had a rough night last night continuously taking off her BiPAP with noted desaturations.  This morning, patient has a flat affect but states that she is doing better this morning.  She has no physical complaints on my examination.  After my rounds, I was notified by RN that patient had a stomachache.  Did not really want to take much PO.  Has not passed any gas yet.  De-escalate back to clear liquids and continue to monitor.  Assessment & Plan:   Principal Problem:   Acute hypoxemic respiratory failure due to COVID-19 [facility]) Active Problems:   2019 novel coronavirus disease (COVID-19)   Hypertension   Type 2 diabetes mellitus (HCC)   Dyslipidemia   Depression   Class 1  obesity   Pulmonary emboli (HCC)   Acute hypoxemic respiratory failure secondary to COVID-19  -Continue 7 L O2, wean as able.  Continue BiPAP nightly and intermittently through the day as recommended by PCCM -Patient remains DNI  COVID-19 Pneumonia -Tested positive on 02/04/2020.  Off isolation -Completed full course of Remdesivir, steroid, baricitinib -Supportive treatments as ordered, encourage IS, encourage prone positioning, encourage mobilization  -Restarted on steroids due to continued respiratory failure -Continue Pulmicort, Brovana, albuterol  Acute PE -Continue Lovenox  Diabetes mellitus type 2 -Continue sliding scale insulin  Anxiety -Appreciate psychiatry -Continue Xanax as needed  Ileus -Abdominal x-ray revealed gas distended distended colon and small bowel without visible small bowel dilatation.  NG tube placed  -Repeat abdominal x-ray with normal bowel gas pattern.  Abdomen remains soft and nontender -Seems to have improved, but now with some abdominal pain.  De-escalate back to clear liquids today and monitor  Obesity -Estimated body mass index is 33.15 kg/m as calculated from the following:   Height as of this encounter: 5\' 8"  (1.727 m).   Weight as of this encounter: 98.9 kg.  Abnormal TSH -Repeat as outpatient in 4 to 6 weeks   DVT prophylaxis: Lovenox   Code Status: DNR Family Communication: No family at bedside, updated husband via phone today Disposition Plan:  Status is: Inpatient  Remains inpatient appropriate because:Hemodynamically unstable   Dispo:  Patient From: Home  Planned Disposition: To be determined  Expected discharge date: 2-3 days  Medically stable for discharge: No.  Remains on high levels of oxygen today. Family hopeful to take patient home once down to 6  L of oxygen.  I think this would also help her mood and mental health.  Reordered PT evaluation    Consultants:   PCCM  Psychiatry  Antimicrobials:   Anti-infectives (From admission, onward)   Start     Dose/Rate Route Frequency Ordered Stop   02/05/20 1600  remdesivir 100 mg in sodium chloride 0.9 % 100 mL IVPB        100 mg 200 mL/hr over 30 Minutes Intravenous Daily 02/04/20 2245 02/08/20 1055   02/04/20 2300  remdesivir 100 mg in sodium chloride 0.9 % 100 mL IVPB        100 mg 200 mL/hr over 30 Minutes Intravenous Every 30 min 02/04/20 2245 02/05/20 0129       Objective: Vitals:   03/06/20 2300 03/07/20 0520 03/07/20 0825 03/07/20 1127  BP:  111/78  105/74  Pulse:  (!) 104  (!) 106  Resp:  20  20  Temp:  98 F (36.7 C)  97.8 F (36.6 C)  TempSrc:    Oral  SpO2: 99% 97% 94% 100%  Weight:      Height:        Intake/Output Summary (Last 24 hours) at 03/07/2020 1211 Last data filed at 03/07/2020 0525 Gross per 24 hour  Intake --  Output 450 ml  Net -450 ml   Filed Weights   02/04/20 1838  Weight: 98.9 kg    Examination: General exam: Appears calm and comfortable  Respiratory system: Diminished breath sounds, respiratory effort normal, on nasal cannula O2 Cardiovascular system: S1 & S2 heard, tachycardic, regular rhythm. No pedal edema. Gastrointestinal system: Abdomen is nondistended, soft and nontender. Central nervous system: Alert and oriented. Non focal exam. Speech clear  Extremities: Symmetric in appearance bilaterally  Skin: No rashes, lesions or ulcers on exposed skin  Psychiatry: Flat affect   Data Reviewed: I have personally reviewed following labs and imaging studies  CBC: Recent Labs  Lab 03/02/20 1703 03/03/20 0744 03/04/20 0308 03/05/20 0305 03/06/20 0254 03/07/20 0450  WBC 7.8 8.1 10.9* 10.0 10.7* 10.3  NEUTROABS 6.7 7.3 9.6* 8.5*  --   --   HGB 11.2* 10.2* 10.9* 10.8* 10.5* 10.3*  HCT 38.6 34.2* 36.7 36.6 35.7* 34.7*  MCV 98.0 95.5 95.3 96.6 97.0 95.3  PLT 409* 428* 488* 493* 455* 439*   Basic Metabolic Panel: Recent Labs  Lab 03/02/20 1703 03/03/20 0744 03/04/20 0308  03/05/20 0305 03/06/20 0254  NA 139 140 139 144 141  K 4.9 5.0 3.8 4.0 3.9  CL 88* 88* 87* 84* 82*  CO2 42* 40* 36* 45* 45*  GLUCOSE 93 186* 250* 237* 278*  BUN 11 20 24* 31* 25*  CREATININE 0.58 0.51 0.66 0.62 0.58  CALCIUM 9.2 9.0 8.9 9.2 8.9  MG  --   --  2.4  --  2.6*  PHOS  --   --  4.6 4.3 3.9   GFR: Estimated Creatinine Clearance: 95.4 mL/min (by C-G formula based on SCr of 0.58 mg/dL). Liver Function Tests: Recent Labs  Lab 03/02/20 1703 03/06/20 0254  AST 28 20  ALT 37 27  ALKPHOS 92 75  BILITOT 0.4 0.6  PROT 8.1 7.4  ALBUMIN 2.9* 3.1*   No results for input(s): LIPASE, AMYLASE in the last 168 hours. No results for input(s): AMMONIA in the last 168 hours. Coagulation Profile: No results for input(s): INR, PROTIME in the last 168 hours. Cardiac Enzymes: No results for input(s): CKTOTAL, CKMB, CKMBINDEX, TROPONINI in the last 168 hours.  BNP (last 3 results) No results for input(s): PROBNP in the last 8760 hours. HbA1C: No results for input(s): HGBA1C in the last 72 hours. CBG: Recent Labs  Lab 03/06/20 0813 03/06/20 1131 03/06/20 1829 03/06/20 2216 03/07/20 0807  GLUCAP 213* 234* 153* 243* 243*   Lipid Profile: No results for input(s): CHOL, HDL, LDLCALC, TRIG, CHOLHDL, LDLDIRECT in the last 72 hours. Thyroid Function Tests: No results for input(s): TSH, T4TOTAL, FREET4, T3FREE, THYROIDAB in the last 72 hours. Anemia Panel: No results for input(s): VITAMINB12, FOLATE, FERRITIN, TIBC, IRON, RETICCTPCT in the last 72 hours. Sepsis Labs: Recent Labs  Lab 03/03/20 0744  PROCALCITON 0.10    Recent Results (from the past 240 hour(s))  MRSA PCR Screening     Status: None   Collection Time: 03/02/20  5:20 PM   Specimen: Nasal Mucosa; Nasopharyngeal  Result Value Ref Range Status   MRSA by PCR NEGATIVE NEGATIVE Final    Comment:        The GeneXpert MRSA Assay (FDA approved for NASAL specimens only), is one component of a comprehensive MRSA  colonization surveillance program. It is not intended to diagnose MRSA infection nor to guide or monitor treatment for MRSA infections. Performed at [facility], [address] [place], [place] [address]       Radiology Studies: DG Abd 1 View  Result Date: 03/06/2020 CLINICAL DATA:  Follow-up ileus EXAM: ABDOMEN - 1 VIEW COMPARISON:  03/04/2020 FINDINGS: Nasogastric tube tip in the fundus of the stomach. Small bowel pattern is normal. Ordinary colonic gas pattern, with some gas in the transverse colon, often seen in debilitated supine patients. IMPRESSION: Nasogastric tube tip in the fundus of the stomach. Unremarkable bowel gas pattern. See above. Electronically Signed   By: Paulina Fusi M.D.   On: 03/06/2020 08:34      Scheduled Meds: . albuterol  2.5 mg Nebulization Q6H  . arformoterol  15 mcg Nebulization BID  . vitamin C  500 mg Oral Daily  . benzonatate  200 mg Oral BID  . budesonide  0.5 mg Nebulization BID  . chlorhexidine  15 mL Mouth Rinse BID  . Chlorhexidine Gluconate Cloth  6 each Topical Daily  . enoxaparin (LOVENOX) injection  100 mg Subcutaneous BID  . feeding supplement  1 Container Oral BID BM  . insulin aspart  0-20 Units Subcutaneous TID WC  . mouth rinse  15 mL Mouth Rinse q12n4p  . methylPREDNISolone (SOLU-MEDROL) injection  60 mg Intravenous Q12H  . mirtazapine  15 mg Oral QHS  . multivitamin with minerals  1 tablet Oral Daily  . traZODone  50 mg Oral QHS  . zinc sulfate  220 mg Oral Daily   Continuous Infusions:   LOS: 31 days      Time spent: 20 minutes   Noralee Stain, DO [facility] 03/07/2020, 12:11 PM   Available via Epic secure chat 7am-7pm After these hours, please refer to coverage provider listed on [email or web address]

## 2020-03-07 NOTE — Progress Notes (Signed)
Pt O2 sats dropped to 80%.  Went to room.  The patient has taken off the bipap mask again. Although pt was educated on the need to keep bipap on she still did not desire it to be on.  The patient desires to take the bipap off and return to 8liter HFNC.  The patient is now on 8 Liter HFNC.  O2 sats are 95-98%.  No acute respiratory distress noted.  Will continue to monitor and note changes in patient condition until report is given to am shift.

## 2020-03-08 LAB — GLUCOSE, CAPILLARY
Glucose-Capillary: 243 mg/dL — ABNORMAL HIGH (ref 70–99)
Glucose-Capillary: 189 mg/dL — ABNORMAL HIGH (ref 70–99)
Glucose-Capillary: 215 mg/dL — ABNORMAL HIGH (ref 70–99)
Glucose-Capillary: 234 mg/dL — ABNORMAL HIGH (ref 70–99)
Glucose-Capillary: 303 mg/dL — ABNORMAL HIGH (ref 70–99)

## 2020-03-08 MED ORDER — ALBUTEROL SULFATE (2.5 MG/3ML) 0.083% IN NEBU
2.5000 mg | INHALATION_SOLUTION | RESPIRATORY_TRACT | Status: DC | PRN
  Filled 2020-03-08: qty 3

## 2020-03-08 MED ORDER — PREDNISONE 50 MG PO TABS
50.0000 mg | ORAL_TABLET | Freq: Every day | ORAL | Status: DC
  Administered 2020-03-09 – 2020-03-10 (×2): 50 mg via ORAL
  Filled 2020-03-08 (×2): qty 1

## 2020-03-08 MED ORDER — ALBUTEROL SULFATE (2.5 MG/3ML) 0.083% IN NEBU
2.5000 mg | INHALATION_SOLUTION | Freq: Three times a day (TID) | RESPIRATORY_TRACT | Status: DC
  Administered 2020-03-08 – 2020-03-09 (×2): 2.5 mg via RESPIRATORY_TRACT
  Filled 2020-03-08 (×2): qty 3

## 2020-03-08 NOTE — Progress Notes (Signed)
RT came to pt room put pt on bipap.

## 2020-03-08 NOTE — Progress Notes (Signed)
   03/08/20 0530  Oxygen Therapy  SpO2 100 %  O2 Device HFNC  O2 Flow Rate (L/min) 8 L/min  Pulse Oximetry Type Continuous  Pt request that bipap be taken off.  Bipap removed and pt resumed previous O2 settings via HFNC.

## 2020-03-08 NOTE — Plan of Care (Signed)
  Problem: Health Behavior/Discharge Planning: Goal: Ability to manage health-related needs will improve Outcome: Progressing   Problem: Clinical Measurements: Goal: Ability to maintain clinical measurements within normal limits will improve Outcome: Progressing Goal: Will remain free from infection Outcome: Progressing Goal: Diagnostic test results will improve Outcome: Progressing Goal: Respiratory complications will improve Outcome: Progressing Goal: Cardiovascular complication will be avoided Outcome: Progressing   Problem: Activity: Goal: Risk for activity intolerance will decrease Outcome: Progressing   Problem: Coping: Goal: Level of anxiety will decrease Outcome: Progressing   Problem: Elimination: Goal: Will not experience complications related to bowel motility Outcome: Progressing Goal: Will not experience complications related to urinary retention Outcome: Progressing   Problem: Pain Managment: Goal: General experience of comfort will improve Outcome: Progressing   Problem: Safety: Goal: Ability to remain free from injury will improve Outcome: Progressing   Problem: Skin Integrity: Goal: Risk for impaired skin integrity will decrease Outcome: Progressing   Problem: Education: Goal: Knowledge of risk factors and measures for prevention of condition will improve Outcome: Progressing   Problem: Coping: Goal: Psychosocial and spiritual needs will be supported Outcome: Progressing   Problem: Respiratory: Goal: Will maintain a patent airway Outcome: Progressing Goal: Complications related to the disease process, condition or treatment will be avoided or minimized Outcome: Progressing   

## 2020-03-08 NOTE — Progress Notes (Signed)
O2 sats 79%.  Went to pt room and found bipap mask off pt. Pt informed again to keep bipap mask on.  Pt still remains with a relatively quick turn around less than one minute to O2 sats 92-94%.  Will continue to monitor and note changes in pt condition.

## 2020-03-08 NOTE — Progress Notes (Signed)
Prepared pt to get night meds but pt was lethargic to arouse.  When pt opened her eyes I stated that we will hold remeron, xanax, and trazadone as you are very sleepy.  Pt agreed.  Medications held at this time.

## 2020-03-08 NOTE — Progress Notes (Signed)
PT Cancellation Note  Patient Details Name: Christina Vincent MRN: [id number] DOB: [birth year]   Cancelled Treatment:    Reason Eval/Treat Not Completed: Other (comment) Family outside room talking with RN on arrival.  Spouse would like pt to have suppository and eat before any therapy at this time.  Will check back as schedule permits.   [name],[name] 03/08/2020, 2:15 PM Paulino Door, DPT Acute Rehabilitation Services Pager: [phone] Office: [phone]

## 2020-03-08 NOTE — Plan of Care (Signed)
  Problem: Health Behavior/Discharge Planning: Goal: Ability to manage health-related needs will improve Outcome: Progressing   Problem: Clinical Measurements: Goal: Ability to maintain clinical measurements within normal limits will improve Outcome: Progressing Goal: Will remain free from infection Outcome: Progressing Goal: Diagnostic test results will improve Outcome: Progressing   

## 2020-03-08 NOTE — Progress Notes (Signed)
O2 decreased to 7 liters HFNC O2 sat 96% will continue to monitor. SRP,RN

## 2020-03-08 NOTE — Progress Notes (Signed)
PROGRESS NOTE    Christina Vincent  PFX:[id number] DOB: 02/01/1961 DOA: 02/04/2020 PCP: Avon Gully, MD     Brief Narrative:  Christina Vincent is a 60 y.o. female with a history of T2DM, HTN, HLD, obesity and depression who presented with fever and dyspnea found to have positive SARS-CoV-2 PCR and mild hypoxia despite clear CXR on 12/27. CRP 16.6, PCT negative. Full scope treatment commenced with subsequent worsening of hypoxia. CTA chest 1/4 revealed worsening airspace disease compatible with progressive covid pneumonia as well as acute bilateral pulmonary emboli without right heart strain. Severe hypoxemia continues. Isolation period has been completed while still hospitalized. CXR repeated 1/13, 1/18 and 1/20 have continued to demonstrate significant heterogenous opacities bilaterally. Psychiatry was consulted for medical management of concomitant anxiety. PCCM consulted for further recommendations 02/28/2020. On 1/23 she became lethargic and was noted to be severely hypercarbic, placed on BiPAP and transferred to ICU. Plan to continue BiPAP and goals of care discussions.  After discussion with family, patient has elected for DNR status but continue full scope of medical care.  New events last 24 hours / Subjective: Continues to take off BiPAP overnight.  This morning, patient has no physical complaints.  Affect remains flat, able to answer all of my questions but does not have any motivation to interact with me in a meaningful way.  When asked about breakfast or tolerating diet, she stated that she did not know and was waiting for her family to arrive.  She did have a bowel movement yesterday.  Assessment & Plan:   Principal Problem:   Acute hypoxemic respiratory failure due to COVID-19 [facility]) Active Problems:   2019 novel coronavirus disease (COVID-19)   Hypertension   Type 2 diabetes mellitus (HCC)   Dyslipidemia   Depression   Class 1 obesity   Pulmonary emboli (HCC)   Acute hypoxemic  respiratory failure secondary to COVID-19  -Continue 8 L O2, wean as able.  Continue BiPAP nightly and intermittently through the day as recommended by PCCM -Patient remains DNI  COVID-19 Pneumonia -Tested positive on 02/04/2020.  Off isolation -Completed full course of Remdesivir, steroid, baricitinib -Supportive treatments as ordered, encourage IS, encourage prone positioning, encourage mobilization  -Restarted on steroids due to continued respiratory failure -wean -Continue Pulmicort, Brovana, albuterol  Acute PE -Continue Lovenox  Diabetes mellitus type 2 -Continue sliding scale insulin  Anxiety -Appreciate psychiatry -Continue Xanax as needed  Ileus -Abdominal x-ray revealed gas distended distended colon and small bowel without visible small bowel dilatation.  NG tube placed  -Repeat abdominal x-ray with normal bowel gas pattern.  Abdomen remains soft and nontender -Seems to have resolved, had bowel movement yesterday.  Positive bowel sounds.  Advance diet  Obesity -Estimated body mass index is 33.15 kg/m as calculated from the following:   Height as of this encounter: 5\' 8"  (1.727 m).   Weight as of this encounter: 98.9 kg.  Abnormal TSH -Repeat as outpatient in 4 to 6 weeks   DVT prophylaxis: Lovenox   Code Status: DNR Family Communication: No family at bedside, updated husband via phone 1/28 Disposition Plan:  Status is: Inpatient  Remains inpatient appropriate because:Hemodynamically unstable   Dispo:  Patient From: Home  Planned Disposition: To be determined  Expected discharge date: 2-3 days  Medically stable for discharge: No.  Remains on high levels of oxygen today. Family hopeful to take patient home once down to 6 L of oxygen.  PT evaluation recommended LTAC versus SNF placement.  TOC consult  placed   Consultants:   PCCM  Psychiatry  Antimicrobials:  Anti-infectives (From admission, onward)   Start     Dose/Rate Route Frequency Ordered Stop    02/05/20 1600  remdesivir 100 mg in sodium chloride 0.9 % 100 mL IVPB        100 mg 200 mL/hr over 30 Minutes Intravenous Daily 02/04/20 2245 02/08/20 1055   02/04/20 2300  remdesivir 100 mg in sodium chloride 0.9 % 100 mL IVPB        100 mg 200 mL/hr over 30 Minutes Intravenous Every 30 min 02/04/20 2245 02/05/20 0129       Objective: Vitals:   03/08/20 0034 03/08/20 0517 03/08/20 0530 03/08/20 1258  BP:  104/67  102/66  Pulse:  96  97  Resp:  16  18  Temp:  98.1 F (36.7 C)  97.9 F (36.6 C)  TempSrc:  Oral  Oral  SpO2: 100% 100% 100% 98%  Weight:      Height:        Intake/Output Summary (Last 24 hours) at 03/08/2020 1259 Last data filed at 03/08/2020 0518 Gross per 24 hour  Intake --  Output 550 ml  Net -550 ml   Filed Weights   02/04/20 1838  Weight: 98.9 kg    Examination: General exam: Appears calm and comfortable  Respiratory system: Clear to auscultation anteriorly. Respiratory effort normal.  On 8 L oxygen Cardiovascular system: S1 & S2 heard, RRR. No pedal edema. Gastrointestinal system: Abdomen is nondistended, soft and nontender. Normal bowel sounds heard. Central nervous system: Alert and oriented. Non focal exam. Speech clear  Extremities: Symmetric in appearance bilaterally  Skin: No rashes, lesions or ulcers on exposed skin  Psychiatry: Mood & affect flat.   Data Reviewed: I have personally reviewed following labs and imaging studies  CBC: Recent Labs  Lab 03/02/20 1703 03/03/20 0744 03/04/20 0308 03/05/20 0305 03/06/20 0254 03/07/20 0450  WBC 7.8 8.1 10.9* 10.0 10.7* 10.3  NEUTROABS 6.7 7.3 9.6* 8.5*  --   --   HGB 11.2* 10.2* 10.9* 10.8* 10.5* 10.3*  HCT 38.6 34.2* 36.7 36.6 35.7* 34.7*  MCV 98.0 95.5 95.3 96.6 97.0 95.3  PLT 409* 428* 488* 493* 455* 439*   Basic Metabolic Panel: Recent Labs  Lab 03/02/20 1703 03/03/20 0744 03/04/20 0308 03/05/20 0305 03/06/20 0254  NA 139 140 139 144 141  K 4.9 5.0 3.8 4.0 3.9  CL 88* 88*  87* 84* 82*  CO2 42* 40* 36* 45* 45*  GLUCOSE 93 186* 250* 237* 278*  BUN 11 20 24* 31* 25*  CREATININE 0.58 0.51 0.66 0.62 0.58  CALCIUM 9.2 9.0 8.9 9.2 8.9  MG  --   --  2.4  --  2.6*  PHOS  --   --  4.6 4.3 3.9   GFR: Estimated Creatinine Clearance: 95.4 mL/min (by C-G formula based on SCr of 0.58 mg/dL). Liver Function Tests: Recent Labs  Lab 03/02/20 1703 03/06/20 0254  AST 28 20  ALT 37 27  ALKPHOS 92 75  BILITOT 0.4 0.6  PROT 8.1 7.4  ALBUMIN 2.9* 3.1*   No results for input(s): LIPASE, AMYLASE in the last 168 hours. No results for input(s): AMMONIA in the last 168 hours. Coagulation Profile: No results for input(s): INR, PROTIME in the last 168 hours. Cardiac Enzymes: No results for input(s): CKTOTAL, CKMB, CKMBINDEX, TROPONINI in the last 168 hours. BNP (last 3 results) No results for input(s): PROBNP in the last 8760 hours. HbA1C:  No results for input(s): HGBA1C in the last 72 hours. CBG: Recent Labs  Lab 03/07/20 1125 03/07/20 1636 03/07/20 2111 03/08/20 0724 03/08/20 1127  GLUCAP 244* 286* 243* 189* 215*   Lipid Profile: No results for input(s): CHOL, HDL, LDLCALC, TRIG, CHOLHDL, LDLDIRECT in the last 72 hours. Thyroid Function Tests: No results for input(s): TSH, T4TOTAL, FREET4, T3FREE, THYROIDAB in the last 72 hours. Anemia Panel: No results for input(s): VITAMINB12, FOLATE, FERRITIN, TIBC, IRON, RETICCTPCT in the last 72 hours. Sepsis Labs: Recent Labs  Lab 03/03/20 0744  PROCALCITON 0.10    Recent Results (from the past 240 hour(s))  MRSA PCR Screening     Status: None   Collection Time: 03/02/20  5:20 PM   Specimen: Nasal Mucosa; Nasopharyngeal  Result Value Ref Range Status   MRSA by PCR NEGATIVE NEGATIVE Final    Comment:        The GeneXpert MRSA Assay (FDA approved for NASAL specimens only), is one component of a comprehensive MRSA colonization surveillance program. It is not intended to diagnose MRSA infection nor to guide  or monitor treatment for MRSA infections. Performed at [facility], [address], [place], [place] [address]       Radiology Studies: No results found.    Scheduled Meds: . albuterol  2.5 mg Nebulization Q6H  . arformoterol  15 mcg Nebulization BID  . vitamin C  500 mg Oral Daily  . benzonatate  200 mg Oral BID  . budesonide  0.5 mg Nebulization BID  . chlorhexidine  15 mL Mouth Rinse BID  . Chlorhexidine Gluconate Cloth  6 each Topical Daily  . enoxaparin (LOVENOX) injection  100 mg Subcutaneous BID  . feeding supplement  1 Container Oral BID BM  . insulin aspart  0-20 Units Subcutaneous TID WC  . mouth rinse  15 mL Mouth Rinse q12n4p  . methylPREDNISolone (SOLU-MEDROL) injection  50 mg Intravenous Daily  . mirtazapine  15 mg Oral QHS  . multivitamin with minerals  1 tablet Oral Daily  . traZODone  50 mg Oral QHS  . zinc sulfate  220 mg Oral Daily   Continuous Infusions:   LOS: 32 days      Time spent: 20 minutes   Noralee Stain, DO [facility] 03/08/2020, 12:59 PM   Available via Epic secure chat 7am-7pm After these hours, please refer to coverage provider listed on [email or web address]

## 2020-03-08 NOTE — Progress Notes (Signed)
Pt c/o of hemorrhoidal discomfort, Anusol ordered as needed, family just arrive and pt want to eat. Will return later to apply cream as ordered. SRP,RN

## 2020-03-08 NOTE — Progress Notes (Signed)
RT placed bipap on Pt.  Per RT the patient was resistant to bipap at first but RT was able to convince pt to put bipap on.

## 2020-03-08 NOTE — Progress Notes (Signed)
Pt resting calmer at the present time. VS stable, voices no complaints at the present time will cont to monitor. SRP,RN

## 2020-03-08 NOTE — Progress Notes (Addendum)
Telemetry called and stated pt O2 sats at 74%.  Arrived to room with pt having removed bipap mask.  O2 sats recovered well to about 90%.  Pt again instructed on leaving bipap mask on.  Pt remains with flat affect but is compliant and has not put up resistance to having the mask placed back on.  Will continue to monitor and note changes.

## 2020-03-08 NOTE — Progress Notes (Signed)
Pt was anxious and crying stating I want to speak with my husband. Phone given to patient to call spouse. SRP, RN will continue monitor patient.

## 2020-03-08 NOTE — Plan of Care (Signed)
  Problem: Health Behavior/Discharge Planning: Goal: Ability to manage health-related needs will improve 03/08/2020 1423 by Charmian Muff, RN Outcome: Progressing 03/08/2020 1307 by Charmian Muff, RN Outcome: Progressing   Problem: Clinical Measurements: Goal: Ability to maintain clinical measurements within normal limits will improve 03/08/2020 1423 by Charmian Muff, RN Outcome: Progressing 03/08/2020 1307 by Charmian Muff, RN Outcome: Progressing Goal: Will remain free from infection 03/08/2020 1423 by Charmian Muff, RN Outcome: Progressing 03/08/2020 1307 by Charmian Muff, RN Outcome: Progressing Goal: Diagnostic test results will improve 03/08/2020 1423 by Charmian Muff, RN Outcome: Progressing 03/08/2020 1307 by Charmian Muff, RN Outcome: Progressing

## 2020-03-08 NOTE — Progress Notes (Signed)
Husband Major called was informed about how pt did throughout the night and challenges met.  Aware of pt lethargy and medications being held temporarily.  Made aware that same medication were given to pt at 2306 and that RT came to put on bipap at 0034.  Aware of pt repeated removal of bipap mask and interventions performed.    Informed that pt bipap was removed at 0530 this am without any issues and pt resumed on previous O2 settings via HFNC.  

## 2020-03-08 NOTE — Progress Notes (Signed)
O2 saturations in the 80's.  Went to room to find bipap mask off.  Instructed pt to keep bipap mask on as this is part of her treatment plan.  No resistance to bipap mask being put on.  O2 sats increased to 92% in less than 1 minute.  Pt remains with flat affect and very tired.  Will continue to monitor.

## 2020-03-08 NOTE — Progress Notes (Signed)
RT came to the room to put pt on bipap.  Per RT pt was very anxious and requested xanax.  Medications (remeron and trazodone)  given to pt as she is alert and responds to questions and name without delay.  RT informed to come back when her medications have started to work.

## 2020-03-09 LAB — GLUCOSE, CAPILLARY
Glucose-Capillary: 227 mg/dL — ABNORMAL HIGH (ref 70–99)
Glucose-Capillary: 191 mg/dL — ABNORMAL HIGH (ref 70–99)
Glucose-Capillary: 297 mg/dL — ABNORMAL HIGH (ref 70–99)
Glucose-Capillary: 270 mg/dL — ABNORMAL HIGH (ref 70–99)

## 2020-03-09 MED ORDER — ALBUTEROL SULFATE (2.5 MG/3ML) 0.083% IN NEBU
2.5000 mg | INHALATION_SOLUTION | Freq: Two times a day (BID) | RESPIRATORY_TRACT | Status: DC
  Administered 2020-03-10 – 2020-03-12 (×5): 2.5 mg via RESPIRATORY_TRACT
  Filled 2020-03-09 (×5): qty 3

## 2020-03-09 MED ORDER — DICYCLOMINE HCL 10 MG PO CAPS
10.0000 mg | ORAL_CAPSULE | Freq: Three times a day (TID) | ORAL | Status: DC | PRN
  Administered 2020-03-09: 10 mg via ORAL
  Filled 2020-03-09: qty 1

## 2020-03-09 MED ORDER — SENNOSIDES-DOCUSATE SODIUM 8.6-50 MG PO TABS
1.0000 | ORAL_TABLET | Freq: Every day | ORAL | Status: DC
  Administered 2020-03-09 – 2020-03-12 (×4): 1 via ORAL
  Filled 2020-03-09 (×4): qty 1

## 2020-03-09 MED ORDER — POLYETHYLENE GLYCOL 3350 17 G PO PACK
17.0000 g | PACK | Freq: Every day | ORAL | Status: DC
  Administered 2020-03-09 – 2020-03-15 (×7): 17 g via ORAL
  Filled 2020-03-09 (×7): qty 1

## 2020-03-09 NOTE — Evaluation (Signed)
Physical Therapy Re-Evaluation Patient Details Name: Christina Vincent MRN: [id number] DOB: Jan 05, 1961 Today's Date: 03/09/2020   History of Present Illness  Pt is 61 y.o. female with medical history significant of depression, dyslipidemia, type 2 diabetes, class I obesity, hypertension who is coming to the emergency department due to progressively worse shortness of breath for the past 6 days associated with fever, chills, fatigue, malaise, sore throat, occasionally productive of whitish/clear dry cough and rhinorrhea.  Patient is unvaccinated and has been admitted for acute hypoxemic respiratory failure secondary to COVID-19.  Pt also found to have PE 02/12/20. Patient became lethargic 1/23 and transferred to ICU - almost intubated but family opted for Do Not Intubate and bipap was used instead. Patient currently in progressive care on 7 L HFNC.  Clinical Impression  Pt admitted as above and presenting with functional mobility limitations 2* generalized weakness, ambulatory balance deficits and limited endurance; as well as elevated anxiety level with SOB causing impulsive and unsafe actions during attempts at mobility.   Pt hopes to progress to dc home with family assist.    Follow Up Recommendations Home health PT;Supervision/Assistance - 24 hour    Equipment Recommendations  Other (comment) (TBD but will likely require RW and 3n1 for dc home)    Recommendations for Other Services       Precautions / Restrictions Precautions Precautions: Fall Precaution Comments: monitor sats, anxious and emotional with activity, Increase to 15 L HFNC for activity Restrictions Weight Bearing Restrictions: No      Mobility  Bed Mobility Overal bed mobility: Needs Assistance Bed Mobility: Sit to Supine;Rolling Rolling: Supervision     Sit to supine: Supervision   General bed mobility comments: On sitting to EOB, pt becoming incontinent of urine and transfered to bed unassisted.     Transfers Overall transfer level: Needs assistance Equipment used: Rolling walker (2 wheeled) Transfers: Sit to/from [employer] Sit to Stand: Min assist Stand pivot transfers: Min assist       General transfer comment: Cues to slow pace for safety but pt very anxious with increased WOB and moving quickly to move to sitting on EOB  Ambulation/Gait             General Gait Details: Stand pvt chair to bed only with use of RW - activity limited by pt anxiety and urinary incontinence  Stairs            Wheelchair Mobility    Modified Rankin (Stroke Patients Only)       Balance Overall balance assessment: Needs assistance Sitting-balance support: No upper extremity supported;Feet supported Sitting balance-Leahy Scale: Fair     Standing balance support: During functional activity;Bilateral upper extremity supported Standing balance-Leahy Scale: Poor                               Pertinent Vitals/Pain Pain Assessment: Faces Faces Pain Scale: Hurts little more Pain Location: generalized Pain Descriptors / Indicators: Sore Pain Intervention(s): Limited activity within patient's tolerance;Monitored during session    Home Living Family/patient expects to be discharged to:: Private residence Living Arrangements: Spouse/significant other;Children Available Help at Discharge: Family Type of Home: House       Home Layout: Two level Home Equipment: None      Prior Function Level of Independence: Independent               Hand Dominance        Extremity/Trunk Assessment  Upper Extremity Assessment Upper Extremity Assessment: Overall WFL for tasks assessed    Lower Extremity Assessment Lower Extremity Assessment: Generalized weakness    Cervical / Trunk Assessment Cervical / Trunk Assessment: Normal  Communication   Communication: No difficulties  Cognition Arousal/Alertness: Awake/alert Behavior During  Therapy: Impulsive;Anxious Overall Cognitive Status: Impaired/Different from baseline                                 General Comments: Anxiety/emotions still significantly limits treatment. Grossly functional but hard to reason with when anxious.      General Comments      Exercises     Assessment/Plan    PT Assessment Patient needs continued PT services  PT Problem List Decreased mobility;Decreased activity tolerance;Cardiopulmonary status limiting activity;Decreased knowledge of use of DME;Decreased strength       PT Treatment Interventions Gait training;DME instruction;Therapeutic exercise;Balance training;Functional mobility training;Therapeutic activities;Patient/family education    PT Goals (Current goals can be found in the Care Plan section)  Acute Rehab PT Goals Patient Stated Goal: to go home PT Goal Formulation: With patient Time For Goal Achievement: 03/23/20 Potential to Achieve Goals: Fair    Frequency Min 3X/week   Barriers to discharge        Co-evaluation               AM-PAC PT "6 Clicks" Mobility  Outcome Measure Help needed turning from your back to your side while in a flat bed without using bedrails?: A Little Help needed moving from lying on your back to sitting on the side of a flat bed without using bedrails?: A Little Help needed moving to and from a bed to a chair (including a wheelchair)?: A Little Help needed standing up from a chair using your arms (e.g., wheelchair or bedside chair)?: A Little Help needed to walk in hospital room?: A Lot Help needed climbing 3-5 steps with a railing? : Total 6 Click Score: 15    End of Session Equipment Utilized During Treatment: Oxygen Activity Tolerance: Patient limited by fatigue;Other (comment) (anxiety) Patient left: in bed;with call bell/phone within reach;with bed alarm set;with family/visitor present Nurse Communication: Mobility status PT Visit Diagnosis: Other  abnormalities of gait and mobility (R26.89)    Time: 4081-4481 PT Time Calculation (min) (ACUTE ONLY): 25 min   Charges:   PT Evaluation $PT Re-evaluation: 1 Re-eval          [name] PT Acute Rehabilitation Services Pager [phone] Office [phone]   Christina Vincent 03/09/2020, 5:35 PM

## 2020-03-09 NOTE — Progress Notes (Addendum)
PROGRESS NOTE    [name]  VOH:[id number] DOB: Nov 16, 1960 DOA: 02/04/2020 PCP: Avon Gully, MD     Brief Narrative:  Christina Vincent is a 60 y.o. female with a history of T2DM, HTN, HLD, obesity and depression who presented with fever and dyspnea found to have positive SARS-CoV-2 PCR and mild hypoxia despite clear CXR on 12/27. CRP 16.6, PCT negative. Full scope treatment commenced with subsequent worsening of hypoxia. CTA chest 1/4 revealed worsening airspace disease compatible with progressive covid pneumonia as well as acute bilateral pulmonary emboli without right heart strain. Severe hypoxemia continues. Isolation period has been completed while still hospitalized. CXR repeated 1/13, 1/18 and 1/20 have continued to demonstrate significant heterogenous opacities bilaterally. Psychiatry was consulted for medical management of concomitant anxiety. PCCM consulted for further recommendations 02/28/2020. On 1/23 she became lethargic and was noted to be severely hypercarbic, placed on BiPAP and transferred to ICU. Plan to continue BiPAP and goals of care discussions.  After discussion with family, patient has elected for DNR status but continue full scope of medical care.  New events last 24 hours / Subjective: Uneventful night last night, kept BiPAP on. This morning, states that her belly does not feel well. Apparently started feeling worse after eating caesar salad yesterday.   Assessment & Plan:   Principal Problem:   Acute hypoxemic respiratory failure due to COVID-19 [facility]) Active Problems:   2019 novel coronavirus disease (COVID-19)   Hypertension   Type 2 diabetes mellitus (HCC)   Dyslipidemia   Depression   Class 1 obesity   Pulmonary emboli (HCC)   Acute hypoxemic respiratory failure secondary to COVID-19  -Continue 8 L O2, wean as able.  Continue BiPAP nightly and intermittently through the day as recommended by PCCM -Patient remains DNI  COVID-19 Pneumonia -Tested  positive on 02/04/2020.  Off isolation -Completed full course of Remdesivir, steroid, baricitinib -Supportive treatments as ordered, encourage IS, encourage prone positioning, encourage mobilization  -Restarted on steroids due to continued respiratory failure -wean -Continue Pulmicort, Brovana, albuterol  Acute PE -Continue Lovenox  Diabetes mellitus type 2 -Continue sliding scale insulin  Anxiety -Appreciate psychiatry -Continue Xanax as needed  Ileus -Abdominal x-ray revealed gas distended distended colon and small bowel without visible small bowel dilatation.  NG tube placed  -Repeat abdominal x-ray with normal bowel gas pattern.  Abdomen remains soft and nontender -Seems to have resolved -Bowel regimen ordered   Obesity -Estimated body mass index is 33.15 kg/m as calculated from the following:   Height as of this encounter: 5\' 8"  (1.727 m).   Weight as of this encounter: 98.9 kg.  Abnormal TSH -Repeat as outpatient in 4 to 6 weeks   DVT prophylaxis: Lovenox   Code Status: DNR Family Communication: No family at bedside Disposition Plan:  Status is: Inpatient  Remains inpatient appropriate because:Hemodynamically unstable   Dispo:  Patient From: Home  Planned Disposition: To be determined  Expected discharge date: 2-3 days  Medically stable for discharge: No.  Remains on high levels of oxygen today. Family hopeful to take patient home once down to 6 L of oxygen.  PT evaluation recommended LTAC versus SNF placement.  TOC consult placed   Consultants:   PCCM  Psychiatry  Antimicrobials:  Anti-infectives (From admission, onward)   Start     Dose/Rate Route Frequency Ordered Stop   02/05/20 1600  remdesivir 100 mg in sodium chloride 0.9 % 100 mL IVPB        100 mg 200 mL/hr  over 30 Minutes Intravenous Daily 02/04/20 2245 02/08/20 1055   02/04/20 2300  remdesivir 100 mg in sodium chloride 0.9 % 100 mL IVPB        100 mg 200 mL/hr over 30 Minutes  Intravenous Every 30 min 02/04/20 2245 02/05/20 0129       Objective: Vitals:   03/08/20 2142 03/09/20 0317 03/09/20 0607 03/09/20 0759  BP: 95/60  95/63   Pulse: (!) 108 97 92   Resp: 20 (!) 32 20   Temp: 98.2 F (36.8 C)  98.4 F (36.9 C)   TempSrc: Oral  Oral   SpO2: 90% 99% 100% 98%  Weight:      Height:        Intake/Output Summary (Last 24 hours) at 03/09/2020 1233 Last data filed at 03/09/2020 0030 Gross per 24 hour  Intake -  Output 700 ml  Net -700 ml   Filed Weights   02/04/20 1838  Weight: 98.9 kg    Examination: General exam: Appears calm and comfortable  Respiratory system: Clear to auscultation. Respiratory effort normal. Cardiovascular system: S1 & S2 heard, RRR. No pedal edema. Gastrointestinal system: Abdomen is nondistended, soft and nontender. Normal bowel sounds heard. Central nervous system: Alert and oriented. Non focal exam. Speech clear  Extremities: Symmetric in appearance bilaterally  Skin: No rashes, lesions or ulcers on exposed skin  Psychiatry: Judgement and insight appear stable  Data Reviewed: I have personally reviewed following labs and imaging studies  CBC: Recent Labs  Lab 03/02/20 1703 03/03/20 0744 03/04/20 0308 03/05/20 0305 03/06/20 0254 03/07/20 0450  WBC 7.8 8.1 10.9* 10.0 10.7* 10.3  NEUTROABS 6.7 7.3 9.6* 8.5*  --   --   HGB 11.2* 10.2* 10.9* 10.8* 10.5* 10.3*  HCT 38.6 34.2* 36.7 36.6 35.7* 34.7*  MCV 98.0 95.5 95.3 96.6 97.0 95.3  PLT 409* 428* 488* 493* 455* 439*   Basic Metabolic Panel: Recent Labs  Lab 03/02/20 1703 03/03/20 0744 03/04/20 0308 03/05/20 0305 03/06/20 0254  NA 139 140 139 144 141  K 4.9 5.0 3.8 4.0 3.9  CL 88* 88* 87* 84* 82*  CO2 42* 40* 36* 45* 45*  GLUCOSE 93 186* 250* 237* 278*  BUN 11 20 24* 31* 25*  CREATININE 0.58 0.51 0.66 0.62 0.58  CALCIUM 9.2 9.0 8.9 9.2 8.9  MG  --   --  2.4  --  2.6*  PHOS  --   --  4.6 4.3 3.9   GFR: Estimated Creatinine Clearance: 95.4 mL/min (by  C-G formula based on SCr of 0.58 mg/dL). Liver Function Tests: Recent Labs  Lab 03/02/20 1703 03/06/20 0254  AST 28 20  ALT 37 27  ALKPHOS 92 75  BILITOT 0.4 0.6  PROT 8.1 7.4  ALBUMIN 2.9* 3.1*   No results for input(s): LIPASE, AMYLASE in the last 168 hours. No results for input(s): AMMONIA in the last 168 hours. Coagulation Profile: No results for input(s): INR, PROTIME in the last 168 hours. Cardiac Enzymes: No results for input(s): CKTOTAL, CKMB, CKMBINDEX, TROPONINI in the last 168 hours. BNP (last 3 results) No results for input(s): PROBNP in the last 8760 hours. HbA1C: No results for input(s): HGBA1C in the last 72 hours. CBG: Recent Labs  Lab 03/08/20 0724 03/08/20 1127 03/08/20 1651 03/08/20 2146 03/09/20 0904  GLUCAP 189* 215* 234* 303* 227*   Lipid Profile: No results for input(s): CHOL, HDL, LDLCALC, TRIG, CHOLHDL, LDLDIRECT in the last 72 hours. Thyroid Function Tests: No results for input(s): TSH,  T4TOTAL, FREET4, T3FREE, THYROIDAB in the last 72 hours. Anemia Panel: No results for input(s): VITAMINB12, FOLATE, FERRITIN, TIBC, IRON, RETICCTPCT in the last 72 hours. Sepsis Labs: Recent Labs  Lab 03/03/20 0744  PROCALCITON 0.10    Recent Results (from the past 240 hour(s))  MRSA PCR Screening     Status: None   Collection Time: 03/02/20  5:20 PM   Specimen: Nasal Mucosa; Nasopharyngeal  Result Value Ref Range Status   MRSA by PCR NEGATIVE NEGATIVE Final    Comment:        The GeneXpert MRSA Assay (FDA approved for NASAL specimens only), is one component of a comprehensive MRSA colonization surveillance program. It is not intended to diagnose MRSA infection nor to guide or monitor treatment for MRSA infections. Performed at [facility], [address], [place], [place] [address]       Radiology Studies: No results found.    Scheduled Meds: . albuterol  2.5 mg Nebulization TID  . arformoterol  15 mcg Nebulization  BID  . vitamin C  500 mg Oral Daily  . benzonatate  200 mg Oral BID  . budesonide  0.5 mg Nebulization BID  . chlorhexidine  15 mL Mouth Rinse BID  . Chlorhexidine Gluconate Cloth  6 each Topical Daily  . enoxaparin (LOVENOX) injection  100 mg Subcutaneous BID  . feeding supplement  1 Container Oral BID BM  . insulin aspart  0-20 Units Subcutaneous TID WC  . mouth rinse  15 mL Mouth Rinse q12n4p  . mirtazapine  15 mg Oral QHS  . multivitamin with minerals  1 tablet Oral Daily  . polyethylene glycol  17 g Oral Daily  . predniSONE  50 mg Oral Q breakfast  . senna-docusate  1 tablet Oral QHS  . traZODone  50 mg Oral QHS  . zinc sulfate  220 mg Oral Daily   Continuous Infusions:   LOS: 33 days      Time spent: 20 minutes   Noralee Stain, DO [facility] 03/09/2020, 12:33 PM   Available via Epic secure chat 7am-7pm After these hours, please refer to coverage provider listed on [email or web address]

## 2020-03-09 NOTE — Progress Notes (Signed)
   03/09/20 0700  Hand-Off documentation  Handoff Given Given to shift RN/LPN  Report given to (Full Name) Am nurse  Nurse given detailed information regarding hospital history  and current status of pt.  Gave information about husband concerns about bowel movements, more mobility, and urine output. Nurse also given information that Husband has inquired about equipment so that she may have home oxygen.  Informed oncoming nurse that these and other questions should be addressed with provider/case manager after plan of care reviewed with interdisciplinary team.  Oncoming Nurse is also aware of pts distended abd and follow up needed for treatment for constipation.  Care has been released to [place], Charity fundraiser.

## 2020-03-09 NOTE — Progress Notes (Signed)
Pt placed on bipap by RT

## 2020-03-09 NOTE — Progress Notes (Signed)
Husband called and given update on pt.  Pt kept bipap on all night. New masked was obtained for a better fit.  O2 sats were stable throughout the night after mask was changed. Husband informed about blood sugars, making pt more mobile, and having pt to drink more water to increase urine output.  Husband still discusses going home.  Some possibilities were discussed but stated that conversation should best be hand with the primary provider and case  Manager when he arrives.  Pt continues to do well on 8L HFNC currently.  Will continue to monitor and note changes in patient condition until am nurse arrives on duty.  All questions answered to best of ability and within scope of practice.

## 2020-03-09 NOTE — Progress Notes (Signed)
Pt refused nocturnal bipap.  Pt stated "no" and was swatting at mask when I attempted to place it on her face.  Pt remains on hfnc.  RN aware.

## 2020-03-09 NOTE — Plan of Care (Signed)
  Problem: Clinical Measurements: Goal: Will remain free from infection Outcome: Progressing Goal: Respiratory complications will improve Outcome: Progressing   Problem: Pain Managment: Goal: General experience of comfort will improve Outcome: Progressing   Problem: Coping: Goal: Psychosocial and spiritual needs will be supported Outcome: Progressing   Problem: Health Behavior/Discharge Planning: Goal: Ability to manage health-related needs will improve Outcome: Not Progressing   Problem: Coping: Goal: Level of anxiety will decrease Outcome: Not Progressing   Problem: Respiratory: Goal: Will maintain a patent airway Outcome: Not Progressing

## 2020-03-10 LAB — GLUCOSE, CAPILLARY
Glucose-Capillary: 195 mg/dL — ABNORMAL HIGH (ref 70–99)
Glucose-Capillary: 270 mg/dL — ABNORMAL HIGH (ref 70–99)
Glucose-Capillary: 318 mg/dL — ABNORMAL HIGH (ref 70–99)
Glucose-Capillary: 234 mg/dL — ABNORMAL HIGH (ref 70–99)

## 2020-03-10 MED ORDER — AYR SALINE NASAL NA GEL: 1.0000 "application " | Freq: Four times a day (QID) | NASAL | Status: DC | PRN

## 2020-03-10 MED ORDER — SALINE SPRAY 0.65 % NA SOLN
1.0000 | NASAL | Status: DC | PRN
  Administered 2020-03-12: 1 via NASAL
  Filled 2020-03-10: qty 44

## 2020-03-10 MED ORDER — PREDNISONE 20 MG PO TABS
40.0000 mg | ORAL_TABLET | Freq: Every day | ORAL | Status: DC
  Administered 2020-03-11: 40 mg via ORAL
  Filled 2020-03-10: qty 2

## 2020-03-10 NOTE — Plan of Care (Signed)
  Problem: Clinical Measurements: Goal: Will remain free from infection Outcome: Progressing Goal: Respiratory complications will improve Outcome: Progressing   Problem: Activity: Goal: Risk for activity intolerance will decrease Outcome: Progressing   Problem: Safety: Goal: Ability to remain free from injury will improve Outcome: Progressing   Problem: Respiratory: Goal: Will maintain a patent airway Outcome: Progressing   Problem: Health Behavior/Discharge Planning: Goal: Ability to manage health-related needs will improve Outcome: Not Progressing   Problem: Education: Goal: Knowledge of risk factors and measures for prevention of condition will improve Outcome: Not Progressing   Problem: Coping: Goal: Psychosocial and spiritual needs will be supported Outcome: Not Progressing

## 2020-03-10 NOTE — Progress Notes (Signed)
   03/10/20 2021  Assess: MEWS Score  Temp 97.6 F (36.4 C)  BP 98/62  Pulse Rate (!) 109  Resp (!) 22  Level of Consciousness Alert  SpO2 100 %  O2 Device Nasal Cannula  Patient Activity (if Appropriate) In bed  O2 Flow Rate (L/min) 8 L/min  Assess: MEWS Score  MEWS Temp 0  MEWS Systolic 1  MEWS Pulse 1  MEWS RR 1  MEWS LOC 0  MEWS Score 3  MEWS Score Color Yellow  Assess: if the MEWS score is Yellow or Red  Were vital signs taken at a resting state? No  Focused Assessment No change from prior assessment  Early Detection of Sepsis Score *See Row Information* Low  MEWS guidelines implemented *See Row Information* Yes  Treat  MEWS Interventions Administered scheduled meds/treatments  Take Vital Signs  Increase Vital Sign Frequency  Yellow: Q 2hr X 2 then Q 4hr X 2, if remains yellow, continue Q 4hrs  Notify: Charge Nurse/RN  Name of Charge Nurse/RN Notified Ewing Schlein, RN  Date Charge Nurse/RN Notified 03/10/20  Time Charge Nurse/RN Notified 2020

## 2020-03-10 NOTE — Progress Notes (Signed)
PROGRESS NOTE    Christina Vincent  ZSW:[id number] DOB: [birth year] DOA: 02/04/2020 PCP: Avon Gully, MD     Brief Narrative:  Christina Vincent is a 60 y.o. female with a history of T2DM, HTN, HLD, obesity and depression who presented with fever and dyspnea found to have positive SARS-CoV-2 PCR and mild hypoxia despite clear CXR on 12/27. CRP 16.6, PCT negative. Full scope treatment commenced with subsequent worsening of hypoxia. CTA chest 1/4 revealed worsening airspace disease compatible with progressive covid pneumonia as well as acute bilateral pulmonary emboli without right heart strain. Severe hypoxemia continues. Isolation period has been completed while still hospitalized. CXR repeated 1/13, 1/18 and 1/20 have continued to demonstrate significant heterogenous opacities bilaterally. Psychiatry was consulted for medical management of concomitant anxiety. PCCM consulted for further recommendations 02/28/2020. On 1/23 she became lethargic and was noted to be severely hypercarbic, placed on BiPAP and transferred to ICU. Plan to continue BiPAP and goals of care discussions.  After discussion with family, patient has elected for DNR status but continue full scope of medical care.  New events last 24 hours / Subjective: States that her abdominal cramping is much improved on as needed [place].  No complaints this morning.  Remains on high flow nasal cannula oxygen  Assessment & Plan:   Principal Problem:   Acute hypoxemic respiratory failure due to COVID-19 [facility]) Active Problems:   2019 novel coronavirus disease (COVID-19)   Hypertension   Type 2 diabetes mellitus (HCC)   Dyslipidemia   Depression   Class 1 obesity   Pulmonary emboli (HCC)   Acute hypoxemic respiratory failure secondary to COVID-19  -Continue 8 L O2, wean as able.  Continue BiPAP nightly and intermittently through the day as recommended by PCCM -Patient remains DNI  COVID-19 Pneumonia -Tested positive on 02/04/2020.  Off  isolation -Completed full course of Remdesivir, steroid, baricitinib -Supportive treatments as ordered, encourage IS, encourage prone positioning, encourage mobilization  -Restarted on steroids due to continued respiratory failure -wean -Continue Pulmicort, Brovana, albuterol  Acute PE -Continue Lovenox  Diabetes mellitus type 2 -Continue sliding scale insulin  Anxiety -Appreciate psychiatry -Continue Xanax as needed  Ileus -Abdominal x-ray revealed gas distended distended colon and small bowel without visible small bowel dilatation.  NG tube placed  -Repeat abdominal x-ray with normal bowel gas pattern.  Abdomen remains soft and nontender -Seems to have resolved -Bowel regimen ordered   Obesity -Estimated body mass index is 33.15 kg/m as calculated from the following:   Height as of this encounter: 5\' 8"  (1.727 m).   Weight as of this encounter: 98.9 kg.  Abnormal TSH -Repeat as outpatient in 4 to 6 weeks   DVT prophylaxis: Lovenox   Code Status: DNR Family Communication: No family at bedside Disposition Plan:  Status is: Inpatient  Remains inpatient appropriate because:Hemodynamically unstable   Dispo:  Patient From:   Home   Planned Disposition: Home  Expected discharge date: 2-3 days  Medically stable for discharge:  .  Remains on high levels of oxygen today. Family hopeful to take patient home once down to 6 L of oxygen.  PT evaluation recommended LTAC versus SNF placement.  TOC consult placed   Consultants:   PCCM  Psychiatry  Antimicrobials:  Anti-infectives (From admission, onward)   Start     Dose/Rate Route Frequency Ordered Stop   02/05/20 1600  remdesivir 100 mg in sodium chloride 0.9 % 100 mL IVPB        100 mg 200 mL/hr  over 30 Minutes Intravenous Daily 02/04/20 2245 02/08/20 1055   02/04/20 2300  remdesivir 100 mg in sodium chloride 0.9 % 100 mL IVPB        100 mg 200 mL/hr over 30 Minutes Intravenous Every 30 min 02/04/20 2245 02/05/20  0129       Objective: Vitals:   03/09/20 2158 03/10/20 0500 03/10/20 0642 03/10/20 0907  BP: (!) 105/59  97/60   Pulse: (!) 107  99   Resp: 20  20   Temp: 99.6 F (37.6 C)  98.2 F (36.8 C)   TempSrc: Axillary  Axillary   SpO2: 94% 98% 99% 91%  Weight:      Height:        Intake/Output Summary (Last 24 hours) at 03/10/2020 1312 Last data filed at 03/09/2020 2200 Gross per 24 hour  Intake --  Output 900 ml  Net -900 ml   Filed Weights   02/04/20 1838  Weight: 98.9 kg    Examination: General exam: Appears calm and comfortable  Respiratory system: Diminished breath sounds, without distress or increase in respiratory effort Cardiovascular system: S1 & S2 heard, RRR. No pedal edema. Gastrointestinal system: Abdomen is nondistended, soft and nontender. Normal bowel sounds heard. Central nervous system: Alert and oriented. Non focal exam. Speech clear  Extremities: Symmetric in appearance bilaterally  Skin: No rashes, lesions or ulcers on exposed skin  Psychiatry: Judgement and insight appear stable. Mood & affect appropriate.    Data Reviewed: I have personally reviewed following labs and imaging studies  CBC: Recent Labs  Lab 03/04/20 0308 03/05/20 0305 03/06/20 0254 03/07/20 0450  WBC 10.9* 10.0 10.7* 10.3  NEUTROABS 9.6* 8.5*  --   --   HGB 10.9* 10.8* 10.5* 10.3*  HCT 36.7 36.6 35.7* 34.7*  MCV 95.3 96.6 97.0 95.3  PLT 488* 493* 455* 439*   Basic Metabolic Panel: Recent Labs  Lab 03/04/20 0308 03/05/20 0305 03/06/20 0254  NA 139 144 141  K 3.8 4.0 3.9  CL 87* 84* 82*  CO2 36* 45* 45*  GLUCOSE 250* 237* 278*  BUN 24* 31* 25*  CREATININE 0.66 0.62 0.58  CALCIUM 8.9 9.2 8.9  MG 2.4  --  2.6*  PHOS 4.6 4.3 3.9   GFR: Estimated Creatinine Clearance: 95.4 mL/min (by C-G formula based on SCr of 0.58 mg/dL). Liver Function Tests: Recent Labs  Lab 03/06/20 0254  AST 20  ALT 27  ALKPHOS 75  BILITOT 0.6  PROT 7.4  ALBUMIN 3.1*   No results for  input(s): LIPASE, AMYLASE in the last 168 hours. No results for input(s): AMMONIA in the last 168 hours. Coagulation Profile: No results for input(s): INR, PROTIME in the last 168 hours. Cardiac Enzymes: No results for input(s): CKTOTAL, CKMB, CKMBINDEX, TROPONINI in the last 168 hours. BNP (last 3 results) No results for input(s): PROBNP in the last 8760 hours. HbA1C: No results for input(s): HGBA1C in the last 72 hours. CBG: Recent Labs  Lab 03/09/20 1253 03/09/20 1635 03/09/20 2156 03/10/20 0821 03/10/20 1202  GLUCAP 191* 297* 270* 195* 270*   Lipid Profile: No results for input(s): CHOL, HDL, LDLCALC, TRIG, CHOLHDL, LDLDIRECT in the last 72 hours. Thyroid Function Tests: No results for input(s): TSH, T4TOTAL, FREET4, T3FREE, THYROIDAB in the last 72 hours. Anemia Panel: No results for input(s): VITAMINB12, FOLATE, FERRITIN, TIBC, IRON, RETICCTPCT in the last 72 hours. Sepsis Labs: No results for input(s): PROCALCITON, LATICACIDVEN in the last 168 hours.  Recent Results (from the past 240  hour(s))  MRSA PCR Screening     Status: None   Collection Time: 03/02/20  5:20 PM   Specimen: Nasal Mucosa; Nasopharyngeal  Result Value Ref Range Status   MRSA by PCR NEGATIVE NEGATIVE Final    Comment:        The GeneXpert MRSA Assay (FDA approved for NASAL specimens only), is one component of a comprehensive MRSA colonization surveillance program. It is not intended to diagnose MRSA infection nor to guide or monitor treatment for MRSA infections. Performed at [facility], [address] [place], [place] [address]       Radiology Studies: No results found.    Scheduled Meds: . albuterol  2.5 mg Nebulization BID  . arformoterol  15 mcg Nebulization BID  . vitamin C  500 mg Oral Daily  . benzonatate  200 mg Oral BID  . budesonide  0.5 mg Nebulization BID  . chlorhexidine  15 mL Mouth Rinse BID  . Chlorhexidine Gluconate Cloth  6 each Topical  Daily  . enoxaparin (LOVENOX) injection  100 mg Subcutaneous BID  . feeding supplement  1 Container Oral BID BM  . insulin aspart  0-20 Units Subcutaneous TID WC  . mouth rinse  15 mL Mouth Rinse q12n4p  . mirtazapine  15 mg Oral QHS  . multivitamin with minerals  1 tablet Oral Daily  . polyethylene glycol  17 g Oral Daily  . predniSONE  50 mg Oral Q breakfast  . senna-docusate  1 tablet Oral QHS  . traZODone  50 mg Oral QHS  . zinc sulfate  220 mg Oral Daily   Continuous Infusions:   LOS: 34 days      Time spent: 20 minutes   Noralee Stain, DO [facility] 03/10/2020, 1:12 PM   Available via Epic secure chat 7am-7pm After these hours, please refer to coverage provider listed on [email or web address]

## 2020-03-10 NOTE — Progress Notes (Signed)
Called STAT to 1440, pt. removed BiPAP V-60, oxygen saturations dropped significantly, upon arrival placed pt. back on HFNC(SALTER) 15 lpm with saturations 94%, stressed importance of keeping oxygen on to pt., RT to monitor.

## 2020-03-10 NOTE — Plan of Care (Signed)
  Problem: Pain Managment: Goal: General experience of comfort will improve Outcome: Progressing   Problem: Safety: Goal: Ability to remain free from injury will improve Outcome: Progressing   Problem: Skin Integrity: Goal: Risk for impaired skin integrity will decrease Outcome: Progressing   Problem: Respiratory: Goal: Will maintain a patent airway Outcome: Progressing Goal: Complications related to the disease process, condition or treatment will be avoided or minimized Outcome: Progressing   

## 2020-03-11 ENCOUNTER — Inpatient Hospital Stay (HOSPITAL_COMMUNITY): Payer: 59

## 2020-03-11 LAB — GLUCOSE, CAPILLARY
Glucose-Capillary: 199 mg/dL — ABNORMAL HIGH (ref 70–99)
Glucose-Capillary: 226 mg/dL — ABNORMAL HIGH (ref 70–99)
Glucose-Capillary: 401 mg/dL — ABNORMAL HIGH (ref 70–99)
Glucose-Capillary: 309 mg/dL — ABNORMAL HIGH (ref 70–99)

## 2020-03-11 MED ORDER — PREDNISONE 20 MG PO TABS
30.0000 mg | ORAL_TABLET | Freq: Every day | ORAL | Status: DC
  Administered 2020-03-12 – 2020-03-17 (×6): 30 mg via ORAL
  Filled 2020-03-11 (×6): qty 1

## 2020-03-11 MED ORDER — INSULIN ASPART 100 UNIT/ML ~~LOC~~ SOLN
0.0000 [IU] | Freq: Every day | SUBCUTANEOUS | Status: DC
  Administered 2020-03-11: 4 [IU] via SUBCUTANEOUS
  Administered 2020-03-12 – 2020-03-13 (×2): 2 [IU] via SUBCUTANEOUS
  Administered 2020-03-15: 4 [IU] via SUBCUTANEOUS

## 2020-03-11 MED ORDER — INSULIN GLARGINE 100 UNIT/ML ~~LOC~~ SOLN
8.0000 [IU] | Freq: Every day | SUBCUTANEOUS | Status: DC
  Administered 2020-03-11 – 2020-03-12 (×2): 8 [IU] via SUBCUTANEOUS
  Filled 2020-03-11 (×3): qty 0.08

## 2020-03-11 MED ORDER — SIMETHICONE 80 MG PO CHEW
80.0000 mg | CHEWABLE_TABLET | Freq: Once | ORAL | Status: AC
  Administered 2020-03-11: 80 mg via ORAL
  Filled 2020-03-11: qty 1

## 2020-03-11 NOTE — Progress Notes (Signed)
Notified provider on call due to patient's blood sugar being 234 to see if patient needed nighttime coverage. Patient received no new orders.

## 2020-03-11 NOTE — Plan of Care (Signed)
?  Problem: Clinical Measurements: ?Goal: Ability to maintain clinical measurements within normal limits will improve ?Outcome: Progressing ?Goal: Will remain free from infection ?Outcome: Progressing ?Goal: Diagnostic test results will improve ?Outcome: Progressing ?  ?

## 2020-03-11 NOTE — Progress Notes (Signed)
Pt c/o increase SOB sat on 7 liters 87% increased to 8 liters HFNC sats increased to 92%. SOB noted during rest and during conversations. Will continue to monitor. SRP, RN

## 2020-03-11 NOTE — Progress Notes (Signed)
Family at bedside, pt SOB with improvement with increased O2 to 8 liters. Will continue to monitor. SRP, RN

## 2020-03-11 NOTE — Progress Notes (Signed)
Inpatient Diabetes Program Recommendations  AACE/ADA: New Consensus Statement on Inpatient Glycemic Control (2015)  Target Ranges:  Prepandial:   less than 140 mg/dL      Peak postprandial:   less than 180 mg/dL (1-2 hours)      Critically ill patients:  140 - 180 mg/dL   Lab Results  Component Value Date   GLUCAP 199 (H) 03/11/2020   HGBA1C 7.7 (H) 02/14/2020    Review of Glycemic Control  Diabetes history: DM2 Outpatient Diabetes medications: metformin 850 mg BID Current orders for Inpatient glycemic control: Novolog 0-20 units TID with meals and 0-5 HS  On Prednisone 40 mg QD HgbA1C - 7.7% CBGs 195, 270 mg/dL.  Inpatient Diabetes Program Recommendations:     Add Lantus 8 units QHS  Continue to follow.  Thank you. Ailene Ards, RD, LDN, CDE Inpatient Diabetes Coordinator [phone]

## 2020-03-11 NOTE — Progress Notes (Signed)
MD updated of changes and chest x-ray ordered and bi-Pap recommended if chest discomfort continues. SRP, RN

## 2020-03-11 NOTE — Discharge Instructions (Signed)
3 Key Steps to Take While Waiting for Your COVID-19 Test Result To help stop the spread of COVID-19, take these 3 key steps NOW while waiting for your test results: 1. Stay home and monitor your health. Stay home and monitor your health to help protect your friends, family, and others from possibly getting COVID-19 from you. Stay home and away from others:  If possible, stay away from others, especially people who are at higher risk for getting very sick from COVID-19, such as older adults and people with other medical conditions.  If you have been in contact with someone with COVID-19, stay home and away from others for 14 days after your last contact with that person. Follow the recommendations of your local public health department if you need to quarantine.  If you have a fever, cough or other symptoms of COVID-19, stay home and away from others (except to get medical care). Monitor your health:  Watch for fever, cough, shortness of breath, or other symptoms of COVID-19. Remember, symptoms may appear 2-14 days after exposure to COVID-19 and can include: ? Fever or chills ? Cough ? Shortness of breath or difficulty breathing ? Tiredness ? Muscle or body aches ? Headache ? New loss of taste or smell ? Sore throat ? Congestion or runny nose ? Nausea or vomiting ? Diarrhea 2. Think about the people you have recently been around. If you are diagnosed with COVID-19, a public health worker may call you to check on your health, discuss who you have been around, and ask where you spent time while you may have been able to spread COVID-19 to others. While you wait for your COVID-19 test result, think about everyone you have been around recently. This will be important information to give health workers if your test is positive.  Complete the information on the back of this page to help you remember everyone you have been around.  3. Answer the phone call from the health department. If a public  health worker calls you, answer the call to help slow the spread of COVID-19 in your community.  Discussions with health department staff are confidential. This means that your personal and medical information will be kept private and only shared with those who may need to know, like your health care provider.  Your name will not be shared with those you came in contact with. The health department will only notify people you were in close contact with (within 6 feet for more than 15 minutes) that they might have been exposed to COVID-19. Think about the people you have recently been around If you test positive and are diagnosed with COVID-19, someone from the health department may call to check-in on your health, discuss who you have been around, and ask where you spent time while you may have been able to spread COVID-19 to others. This form can help you think about people you have recently been around so you will be ready if a public health worker calls you. Things to think about. Have you:  Gone to work or school?  Gotten together with others (eaten out at a restaurant, gone out for drinks, exercised with others or gone to a gym, had friends or family over to your house, volunteered, gone to a party, pool, or park)?  Gone to a store in person (e.g., grocery store, mall)?  Gone to in-person appointments (e.g., salon, barber, doctor's or dentist's office)?  Ridden in a car with others (e.g., rideshare) or   taken public transportation?  Been inside a church, synagogue, mosque or other places of worship? Who lives with you?  ______________________________________________________________________  ______________________________________________________________________  ______________________________________________________________________  ______________________________________________________________________ Who have you been around (less than 6 feet for a total of 15 minutes or more) in the  last 10 days? (You may have more people to list than the space provided. If so, write on the front of this sheet or a separate piece of paper.) Name ______________________________________________  Phone number ____________________________________  Date you last saw them _____________________________  Where you last saw them ________________________________________________ Name ______________________________________________  Phone number ____________________________________  Date you last saw them _____________________________  Where you last saw them ________________________________________________ Name ______________________________________________  Phone number ____________________________________  Date you last saw them _____________________________  Where you last saw them ________________________________________________ Name ______________________________________________  Phone number ____________________________________  Date you last saw them _____________________________  Where you last saw them ________________________________________________ Name ______________________________________________  Phone number ____________________________________  Date you last saw them _____________________________  Where you last saw them ________________________________________________ What have you done in the last 10 days with other people? Activity _____________________________________________  Location _________________________________________  Date ____________________________________________ Activity _____________________________________________  Location _________________________________________  Date ____________________________________________ Activity _____________________________________________  Location _________________________________________  Date ____________________________________________ Activity _____________________________________________  Location  _________________________________________  Date ____________________________________________ Activity _____________________________________________  Location _________________________________________  Date ____________________________________________ Centers for Disease Control and Prevention cdc.gov/coronavirus 08/17/2019 This information is not intended to replace advice given to you by your health care provider. Make sure you discuss any questions you have with your health care provider. Document Revised: 12/10/2019 Document Reviewed: 12/10/2019 Elsevier Patient Education  2021 [employer]  

## 2020-03-11 NOTE — Progress Notes (Signed)
Added sterile water to hfnc 02 system - uneventful. 

## 2020-03-11 NOTE — Progress Notes (Signed)
Family called a few times throughout the shift to check on patient. Nurse gave updates on patient.

## 2020-03-11 NOTE — Progress Notes (Signed)
   03/11/20 0501  Assess: MEWS Score  Temp 98.2 F (36.8 C)  BP 97/69  Pulse Rate (!) 107  Resp 20  Level of Consciousness Alert  SpO2 98 %  O2 Device HFNC  O2 Flow Rate (L/min) 7 L/min  Assess: MEWS Score  MEWS Temp 0  MEWS Systolic 1  MEWS Pulse 1  MEWS RR 0  MEWS LOC 0  MEWS Score 2  MEWS Score Color Yellow  Assess: if the MEWS score is Yellow or Red  Were vital signs taken at a resting state? Yes  Focused Assessment No change from prior assessment  Early Detection of Sepsis Score *See Row Information* Low  MEWS guidelines implemented *See Row Information* No, previously yellow, continue vital signs every 4 hours  Notify: Charge Nurse/RN  Name of Charge Nurse/RN Notified Ewing Schlein, RN  Date Charge Nurse/RN Notified 03/11/20  Time Charge Nurse/RN Notified 0530

## 2020-03-11 NOTE — Progress Notes (Signed)
PT Cancellation Note  Patient Details Name: Christina Vincent MRN: [id number] DOB: 10/16/1960   Cancelled Treatment:    Reason Eval/Treat Not Completed: Patient declined, no reason specified Pt attempting to eat lunch and reports having to take her time due to SOB.  Pt declined to participate at this time.  RN notified of SOB.   Livana Yerian,[name] 03/11/2020, 2:44 PM Thomasene Mohair PT, DPT Acute [facility] Services Pager: [phone] Office: [phone]

## 2020-03-11 NOTE — Progress Notes (Signed)
PROGRESS NOTE    Christina Vincent  JOA:[id number] DOB: [birth year] DOA: 02/04/2020 PCP: Avon Gully, MD     Brief Narrative:  Christina Vincent is a 60 y.o. female with a history of T2DM, HTN, HLD, obesity and depression who presented with fever and dyspnea found to have positive SARS-CoV-2 PCR and mild hypoxia despite clear CXR on 12/27. CRP 16.6, PCT negative. Full scope treatment commenced with subsequent worsening of hypoxia. CTA chest 1/4 revealed worsening airspace disease compatible with progressive covid pneumonia as well as acute bilateral pulmonary emboli without right heart strain. Severe hypoxemia continues. Isolation period has been completed while still hospitalized. CXR repeated 1/13, 1/18 and 1/20 have continued to demonstrate significant heterogenous opacities bilaterally. Psychiatry was consulted for medical management of concomitant anxiety. PCCM consulted for further recommendations 02/28/2020. On 1/23 she became lethargic and was noted to be severely hypercarbic, placed on BiPAP and transferred to ICU. Plan to continue BiPAP and goals of care discussions.  After discussion with family, patient has elected for DNR status but continue full scope of medical care.   New events last 24 hours / Subjective: Uneventful night. No new complaints, remains on 7L O2 this morning   Assessment & Plan:   Principal Problem:   Acute hypoxemic respiratory failure due to COVID-19 [facility]) Active Problems:   2019 novel coronavirus disease (COVID-19)   Hypertension   Type 2 diabetes mellitus (HCC)   Dyslipidemia   Depression   Class 1 obesity   Pulmonary emboli (HCC)   Acute hypoxemic respiratory failure secondary to COVID-19  -Continue 7 L O2, wean as able.  Continue BiPAP nightly and intermittently through the day as recommended by PCCM -Patient remains DNI   COVID-19 Pneumonia -Tested positive on 02/04/2020.  Off isolation -Completed full course of Remdesivir, steroid,  baricitinib -Supportive treatments as ordered, encourage IS, encourage prone positioning, encourage mobilization  -Restarted on steroids due to continued respiratory failure -wean  -Continue Pulmicort, Brovana, albuterol  Acute PE -Continue Lovenox  Diabetes mellitus type 2 -Continue sliding scale insulin. Added lantus   Anxiety -Appreciate psychiatry -Continue Xanax as needed  Ileus -Abdominal x-ray revealed gas distended distended colon and small bowel without visible small bowel dilatation.  NG tube placed  -Repeat abdominal x-ray with normal bowel gas pattern.  Abdomen remains soft and nontender -Seems to have resolved -Bowel regimen ordered   Obesity -Estimated body mass index is 33.15 kg/m as calculated from the following:   Height as of this encounter: 5\' 8"  (1.727 m).   Weight as of this encounter: 98.9 kg.  Abnormal TSH -Repeat as outpatient in 4 to 6 weeks   DVT prophylaxis: Lovenox   Code Status: DNR Family Communication: No family at bedside Disposition Plan:  Status is: Inpatient  Remains inpatient appropriate because:Hemodynamically unstable   Dispo:  Patient From:   Home   Planned Disposition: Home  Expected discharge date: 2-3 days  Medically stable for discharge:  .  Remains on high levels of oxygen today. Family hopeful to take patient home once down to 6 L of oxygen.  PT evaluation recommended LTAC versus SNF placement.  TOC consult placed   Consultants:   PCCM  Psychiatry  Antimicrobials:  Anti-infectives (From admission, onward)   Start     Dose/Rate Route Frequency Ordered Stop   02/05/20 1600  remdesivir 100 mg in sodium chloride 0.9 % 100 mL IVPB        100 mg 200 mL/hr over 30 Minutes Intravenous Daily 02/04/20 2245  02/08/20 1055   02/04/20 2300  remdesivir 100 mg in sodium chloride 0.9 % 100 mL IVPB        100 mg 200 mL/hr over 30 Minutes Intravenous Every 30 min 02/04/20 2245 02/05/20 0129       Objective: Vitals:    03/11/20 0007 03/11/20 0501 03/11/20 0808 03/11/20 1012  BP: 109/68 97/69  108/63  Pulse: (!) 102 (!) 107  (!) 105  Resp: 20 20  15   Temp: (!) 97.4 F (36.3 C) 98.2 F (36.8 C)  97.8 F (36.6 C)  TempSrc: Oral Axillary    SpO2: 98% 98% 93% (!) 83%  Weight:      Height:        Intake/Output Summary (Last 24 hours) at 03/11/2020 1117 Last data filed at 03/11/2020 0515 Gross per 24 hour  Intake 720 ml  Output 700 ml  Net 20 ml   Filed Weights   02/04/20 1838  Weight: 98.9 kg    Examination: General exam: Appears calm and comfortable  Respiratory system: Clear to auscultation anteriorly. Respiratory effort normal. Cardiovascular system: S1 & S2 heard, RRR. No pedal edema. Gastrointestinal system: Abdomen is nondistended, soft and nontender. Normal bowel sounds heard. Central nervous system: Alert and oriented. Non focal exam. Speech clear  Extremities: Symmetric in appearance bilaterally  Skin: No rashes, lesions or ulcers on exposed skin  Psychiatry: Judgement and insight appear stable. Mood & affect appropriate.    Data Reviewed: I have personally reviewed following labs and imaging studies  CBC: Recent Labs  Lab 03/05/20 0305 03/06/20 0254 03/07/20 0450  WBC 10.0 10.7* 10.3  NEUTROABS 8.5*  --   --   HGB 10.8* 10.5* 10.3*  HCT 36.6 35.7* 34.7*  MCV 96.6 97.0 95.3  PLT 493* 455* 439*   Basic Metabolic Panel: Recent Labs  Lab 03/05/20 0305 03/06/20 0254  NA 144 141  K 4.0 3.9  CL 84* 82*  CO2 45* 45*  GLUCOSE 237* 278*  BUN 31* 25*  CREATININE 0.62 0.58  CALCIUM 9.2 8.9  MG  --  2.6*  PHOS 4.3 3.9   GFR: Estimated Creatinine Clearance: 95.4 mL/min (by C-G formula based on SCr of 0.58 mg/dL). Liver Function Tests: Recent Labs  Lab 03/06/20 0254  AST 20  ALT 27  ALKPHOS 75  BILITOT 0.6  PROT 7.4  ALBUMIN 3.1*   No results for input(s): LIPASE, AMYLASE in the last 168 hours. No results for input(s): AMMONIA in the last 168 hours. Coagulation  Profile: No results for input(s): INR, PROTIME in the last 168 hours. Cardiac Enzymes: No results for input(s): CKTOTAL, CKMB, CKMBINDEX, TROPONINI in the last 168 hours. BNP (last 3 results) No results for input(s): PROBNP in the last 8760 hours. HbA1C: No results for input(s): HGBA1C in the last 72 hours. CBG: Recent Labs  Lab 03/10/20 0821 03/10/20 1202 03/10/20 1807 03/10/20 2017 03/11/20 0801  GLUCAP 195* 270* 318* 234* 199*   Lipid Profile: No results for input(s): CHOL, HDL, LDLCALC, TRIG, CHOLHDL, LDLDIRECT in the last 72 hours. Thyroid Function Tests: No results for input(s): TSH, T4TOTAL, FREET4, T3FREE, THYROIDAB in the last 72 hours. Anemia Panel: No results for input(s): VITAMINB12, FOLATE, FERRITIN, TIBC, IRON, RETICCTPCT in the last 72 hours. Sepsis Labs: No results for input(s): PROCALCITON, LATICACIDVEN in the last 168 hours.  Recent Results (from the past 240 hour(s))  MRSA PCR Screening     Status: None   Collection Time: 03/02/20  5:20 PM   Specimen: Nasal  Mucosa; Nasopharyngeal  Result Value Ref Range Status   MRSA by PCR NEGATIVE NEGATIVE Final    Comment:        The GeneXpert MRSA Assay (FDA approved for NASAL specimens only), is one component of a comprehensive MRSA colonization surveillance program. It is not intended to diagnose MRSA infection nor to guide or monitor treatment for MRSA infections. Performed at [facility], [address] [place], [place] [address]       Radiology Studies: No results found.    Scheduled Meds: . albuterol  2.5 mg Nebulization BID  . arformoterol  15 mcg Nebulization BID  . vitamin C  500 mg Oral Daily  . benzonatate  200 mg Oral BID  . budesonide  0.5 mg Nebulization BID  . chlorhexidine  15 mL Mouth Rinse BID  . Chlorhexidine Gluconate Cloth  6 each Topical Daily  . enoxaparin (LOVENOX) injection  100 mg Subcutaneous BID  . feeding supplement  1 Container Oral BID BM  .  insulin aspart  0-20 Units Subcutaneous TID WC  . insulin aspart  0-5 Units Subcutaneous QHS  . insulin glargine  8 Units Subcutaneous QHS  . mouth rinse  15 mL Mouth Rinse q12n4p  . mirtazapine  15 mg Oral QHS  . multivitamin with minerals  1 tablet Oral Daily  . polyethylene glycol  17 g Oral Daily  . predniSONE  40 mg Oral Q breakfast  . senna-docusate  1 tablet Oral QHS  . traZODone  50 mg Oral QHS  . zinc sulfate  220 mg Oral Daily   Continuous Infusions:   LOS: 35 days      Time spent: 20 minutes   Noralee Stain, DO [facility] 03/11/2020, 11:17 AM   Available via Epic secure chat 7am-7pm After these hours, please refer to coverage provider listed on [email or web address]

## 2020-03-11 NOTE — Progress Notes (Signed)
Pt refused BiPAP qhs, stating abdominal discomfort and distention.  Pt encouraged to contact RT should she change her mind.

## 2020-03-12 LAB — GLUCOSE, CAPILLARY
Glucose-Capillary: 191 mg/dL — ABNORMAL HIGH (ref 70–99)
Glucose-Capillary: 303 mg/dL — ABNORMAL HIGH (ref 70–99)
Glucose-Capillary: 277 mg/dL — ABNORMAL HIGH (ref 70–99)
Glucose-Capillary: 212 mg/dL — ABNORMAL HIGH (ref 70–99)

## 2020-03-12 MED ORDER — BISACODYL 10 MG RE SUPP
10.0000 mg | Freq: Once | RECTAL | Status: DC
  Filled 2020-03-12: qty 1

## 2020-03-12 NOTE — Progress Notes (Signed)
Pt returned to bed, with OT and Clinical research associate. Pt tolerated well, some anxiety, able to relax and focus on breathing ans successfully returned to bed with assistance. Pt O2 13 liters HFNC will begin to wean back to baseline. SRP, RN

## 2020-03-12 NOTE — Progress Notes (Signed)
Pt up in chair desat to 60s increased O2 13 liters HFNC sats gradually increased and settled to 95%. HR tachy at 110s, RR- 20-30's pt calm in chair at current time. Will cont to monitor. SRP, RN

## 2020-03-12 NOTE — Progress Notes (Signed)
   03/11/20 1929  Assess: MEWS Score  Temp 98.6 F (37 C)  BP 100/63  Pulse Rate (!) 116  Resp 17  Level of Consciousness Alert  SpO2 94 %  O2 Device HFNC  Patient Activity (if Appropriate) In bed  O2 Flow Rate (L/min) 8 L/min  Assess: MEWS Score  MEWS Temp 0  MEWS Systolic 1  MEWS Pulse 2  MEWS RR 0  MEWS LOC 0  MEWS Score 3  MEWS Score Color Yellow  Assess: if the MEWS score is Yellow or Red  Were vital signs taken at a resting state? Yes  Focused Assessment No change from prior assessment  Early Detection of Sepsis Score *See Row Information* Medium  MEWS guidelines implemented *See Row Information* No, previously yellow, continue vital signs every 4 hours  Treat  MEWS Interventions Other (Comment) (Continue to adminster scheduled meds/treatments)  Notify: Charge Nurse/RN  Name of Charge Nurse/RN Notified Hayden Pedro, RN  Date Charge Nurse/RN Notified 03/11/20  Time Charge Nurse/RN Notified 1945

## 2020-03-12 NOTE — Progress Notes (Signed)
PROGRESS NOTE  Christina Vincent VXB:[id number] DOB: 10/04/1960 DOA: 02/04/2020 PCP: Avon Gully, MD  HPI/Recap of past 24 hours: Christina D Siddleis a 60 y.o.femalewith a history of T2DM, HTN, HLD, obesity and depression who presented with fever and dyspnea found to have positive SARS-CoV-2 PCR and mild hypoxia despite clear CXR on 12/27. CRP 16.6, PCT negative. Full scope treatment commenced with subsequent worsening of hypoxia. CTA chest 1/4 revealed worsening airspace disease compatible with progressive covid pneumonia as well as acute bilateral pulmonary emboli without right heart strain. Severe hypoxemia continues. Isolation period has been completed while still hospitalized. CXR repeated 1/13, 1/18 and 1/20 have continued to demonstrate significant heterogenous opacities bilaterally. Psychiatry was consulted for medical management of concomitant anxiety. PCCM consulted for further recommendations 02/28/2020. On 1/23 she became lethargic and was noted to be severely hypercarbic, placed on BiPAP and transferred to ICU. Plan to continue BiPAP and goals of care discussions.  After discussion with family, patient has elected for DNR status but continue full scope of medical care.   03/12/20: Seen and examined at bedside. Reports generalized weakness. Breathing is improved however still dyspneic with minimal movement.   Assessment/Plan: Principal Problem:   Acute hypoxemic respiratory failure due to COVID-19 [facility]) Active Problems:   2019 novel coronavirus disease (COVID-19)   Hypertension   Type 2 diabetes mellitus (HCC)   Dyslipidemia   Depression   Class 1 obesity   Pulmonary emboli (HCC)  Acute hypoxemic respiratory failure secondary to COVID-19  -Continue 7 L O2, wean as able.  Continue BiPAP nightly and intermittently through the day as recommended by PCCM -Out of bed to chair as needed -Use incentive spirometer  COVID-19 viral pneumonia, POA -Tested positive on 02/04/2020.  Off  isolation -Completed full course of Remdesivir, steroid, baricitinib -Supportive treatments as ordered, encourage IS, encourage prone positioning, encourage mobilization  -Restarted on steroids due to continued respiratory failure -wean wean off prednisone. -Continue Pulmicort, Brovana, albuterol  Acute PE Continue full dose Lovenox twice daily.  Diabetes mellitus type 2 Hemoglobin A1c 7.7 on 02/14/2020 Continue sliding scale insulin. Added lantus   Anxiety -Appreciate psychiatry -Continue Xanax as needed  Ileus -Abdominal x-ray revealed gas distended distended colon and small bowel without visible small bowel dilatation.  NG tube placed  -Repeat abdominal x-ray with normal bowel gas pattern.  Abdomen remains soft and nontender -Seems to have resolved -Dulcolax suppository added  Obesity -Estimated body mass index is 33.15 kg/m as calculated from the following:   Height as of this encounter: 5\' 8"  (1.727 m).   Weight as of this encounter: 98.9 kg.  Abnormal TSH, subclinical hypothyroidism Normal Free T4, TSH 0.308. -Repeat as outpatient in 4 to 6 weeks  Generalized weakness PT to assess Out of bed to chair as tolerated Fall precautions   DVT prophylaxis: Full dose Lovenox subcu twice daily.   Code Status: DNR Family Communication: No family at bedside    Status is: Inpatient    Dispo:  Patient From:    Planned Disposition: Home  Expected discharge date: 03/17/2020  Medically stable for discharge:  No, ongoing management of COVID-19 viral pneumonia and acute hypoxic respiratory failure..          Objective: Vitals:   03/12/20 0700 03/12/20 0753 03/12/20 0800 03/12/20 1406  BP:    102/62  Pulse:    (!) 118  Resp: 20  (!) 23 (!) 22  Temp:    98.2 F (36.8 C)  TempSrc:    Oral  SpO2:  97%  100%  Weight:      Height:        Intake/Output Summary (Last 24 hours) at 03/12/2020 1804 Last data filed at 03/12/2020 0500 Gross per 24 hour  Intake  240 ml  Output 1650 ml  Net -1410 ml   Filed Weights   02/04/20 1838  Weight: 98.9 kg    Exam:  . General: 60 y.o. year-old female chronically ill-appearing. Alert. Fatigued. Oriented x3. . Cardiovascular: Regular rate and rhythm with no rubs or gallops.  No thyromegaly or JVD noted.   Marland Kitchen Respiratory: Mild rales at bases no wheezing noted. Poor inspiratory effort.  . Abdomen: Soft nontender nondistended with normal bowel sounds x4 quadrants. . Musculoskeletal: No lower extremity edema. 2/4 pulses in all 4 extremities. . Skin: No ulcerative lesions noted or rashes, . Psychiatry: Mood is appropriate for condition and setting   Data Reviewed: CBC: Recent Labs  Lab 03/06/20 0254 03/07/20 0450  WBC 10.7* 10.3  HGB 10.5* 10.3*  HCT 35.7* 34.7*  MCV 97.0 95.3  PLT 455* 439*   Basic Metabolic Panel: Recent Labs  Lab 03/06/20 0254  NA 141  K 3.9  CL 82*  CO2 45*  GLUCOSE 278*  BUN 25*  CREATININE 0.58  CALCIUM 8.9  MG 2.6*  PHOS 3.9   GFR: Estimated Creatinine Clearance: 95.4 mL/min (by C-G formula based on SCr of 0.58 mg/dL). Liver Function Tests: Recent Labs  Lab 03/06/20 0254  AST 20  ALT 27  ALKPHOS 75  BILITOT 0.6  PROT 7.4  ALBUMIN 3.1*   No results for input(s): LIPASE, AMYLASE in the last 168 hours. No results for input(s): AMMONIA in the last 168 hours. Coagulation Profile: No results for input(s): INR, PROTIME in the last 168 hours. Cardiac Enzymes: No results for input(s): CKTOTAL, CKMB, CKMBINDEX, TROPONINI in the last 168 hours. BNP (last 3 results) No results for input(s): PROBNP in the last 8760 hours. HbA1C: No results for input(s): HGBA1C in the last 72 hours. CBG: Recent Labs  Lab 03/11/20 1759 03/11/20 2137 03/12/20 0829 03/12/20 1245 03/12/20 1722  GLUCAP 401* 309* 191* 303* 277*   Lipid Profile: No results for input(s): CHOL, HDL, LDLCALC, TRIG, CHOLHDL, LDLDIRECT in the last 72 hours. Thyroid Function Tests: No results for  input(s): TSH, T4TOTAL, FREET4, T3FREE, THYROIDAB in the last 72 hours. Anemia Panel: No results for input(s): VITAMINB12, FOLATE, FERRITIN, TIBC, IRON, RETICCTPCT in the last 72 hours. Urine analysis: No results found for: COLORURINE, APPEARANCEUR, LABSPEC, PHURINE, GLUCOSEU, HGBUR, BILIRUBINUR, KETONESUR, PROTEINUR, UROBILINOGEN, NITRITE, LEUKOCYTESUR Sepsis Labs: @LABRCNTIP (procalcitonin:4,lacticidven:4)  )No results found for this or any previous visit (from the past 240 hour(s)).    Studies: No results found.  Scheduled Meds: . arformoterol  15 mcg Nebulization BID  . vitamin C  500 mg Oral Daily  . benzonatate  200 mg Oral BID  . budesonide  0.5 mg Nebulization BID  . chlorhexidine  15 mL Mouth Rinse BID  . Chlorhexidine Gluconate Cloth  6 each Topical Daily  . enoxaparin (LOVENOX) injection  100 mg Subcutaneous BID  . feeding supplement  1 Container Oral BID BM  . insulin aspart  0-20 Units Subcutaneous TID WC  . insulin aspart  0-5 Units Subcutaneous QHS  . insulin glargine  8 Units Subcutaneous QHS  . mouth rinse  15 mL Mouth Rinse q12n4p  . mirtazapine  15 mg Oral QHS  . multivitamin with minerals  1 tablet Oral Daily  . polyethylene glycol  17 g Oral Daily  . predniSONE  30 mg Oral Q breakfast  . senna-docusate  1 tablet Oral QHS  . traZODone  50 mg Oral QHS  . zinc sulfate  220 mg Oral Daily    Continuous Infusions:   LOS: 36 days     Darlin Drop, MD [facility] Pager [phone]  If 7PM-7AM, please contact night-coverage [email or web address] Password [facility] 03/12/2020, 6:04 PM

## 2020-03-12 NOTE — Plan of Care (Signed)
  Problem: Health Behavior/Discharge Planning: Goal: Ability to manage health-related needs will improve Outcome: Progressing   Problem: Clinical Measurements: Goal: Ability to maintain clinical measurements within normal limits will improve Outcome: Progressing   

## 2020-03-12 NOTE — Progress Notes (Signed)
Notified provider on call due patient having abdominal pain and abdominal distension. Patient was also passing gas. Provider on call gave new orders for Mylicon chewable tablet 80 mg po once.

## 2020-03-12 NOTE — Progress Notes (Signed)
Occupational Therapy Treatment Patient Details Name: Christina Vincent MRN: [id number] DOB: 10-08-1960 Today's Date: 03/12/2020    History of present illness Pt is 60 y.o. female with medical history significant of depression, dyslipidemia, type 2 diabetes, class I obesity, hypertension who is coming to the emergency department due to progressively worse shortness of breath for the past 6 days associated with fever, chills, fatigue, malaise, sore throat, occasionally productive of whitish/clear dry cough and rhinorrhea.  Patient is unvaccinated and has been admitted for acute hypoxemic respiratory failure secondary to COVID-19.  Pt also found to have PE 02/12/20. Patient became lethargic 1/23 and transferred to ICU - almost intubated but family opted for Do Not Intubate and bipap was used instead. Patient currently in progressive care on 7 L HFNC.   OT comments  Patient primary limitation is anxiety. Played calming music which pt states she likes however still becomes "paranoid" and very anxious with sit to stands from chair requiring min A to power up to standing and 3-4 mins of recovery between each rep. Pt completed 3, then B hand held assist to transfer back to bed. Pt desat to mid 60s on 13L with activity. Will continue to follow and plan to pre-medicate with anxiety medications, pt request about 45 mins before session.    Follow Up Recommendations  LTACH    Equipment Recommendations  3 in 1 bedside commode;Tub/shower seat       Precautions / Restrictions Precautions Precautions: Fall Precaution Comments: monitor sats, anxious and emotional with activity, Increase to 15 L HFNC for activity       Mobility Bed Mobility Overal bed mobility: Needs Assistance         Sit to supine: Supervision      Transfers Overall transfer level: Needs assistance Equipment used: 2 person hand held assist Transfers: Sit to/from Stand;Stand Pivot Transfers Sit to Stand: Min assist Stand pivot  transfers: Min assist       General transfer comment: needs cues for hand placement and min A to power up to standing    Balance Overall balance assessment: Needs assistance Sitting-balance support: Feet supported Sitting balance-Leahy Scale: Fair     Standing balance support: Bilateral upper extremity supported Standing balance-Leahy Scale: Poor Standing balance comment: 2 hand holds                           ADL either performed or assessed with clinical judgement   ADL Overall ADL's : Needs assistance/impaired                         Toilet Transfer: Minimal assistance;+2 for safety/equipment;Stand-pivot;Cueing for sequencing;Cueing for safety Toilet Transfer Details (indicate cue type and reason): pivot from recliner to bed, hand held assist x2 as patient is anxious with just 1 assist. does need increased time and min A to power up to standing from recliner         Functional mobility during ADLs: Minimal assistance;+2 for safety/equipment General ADL Comments: encouraged relaxation techniques and played music during treatment pt state "I like this"               Cognition Arousal/Alertness: Awake/alert Behavior During Therapy: Anxious Overall Cognitive Status: Impaired/Different from baseline Area of Impairment: Following commands;Problem solving                       Following Commands: Follows one step commands inconsistently  Problem Solving: Requires verbal cues General Comments: needing increased cues due to anxiety, pt is aware this limits her "I don't like being this way"        Exercises Exercises: Other exercises Other Exercises Other Exercises: sit to stand performed x3 needing approx 3-4 min recovery between due to desat to mid 60s on 13L HFNC. Does recover to mid 90s           Pertinent Vitals/ Pain       Pain Assessment: No/denies pain         Frequency  Min 2X/week        Progress Toward  Goals  OT Goals(current goals can now be found in the care plan section)  Progress towards OT goals: Progressing toward goals  Acute Rehab OT Goals Patient Stated Goal: to go home OT Goal Formulation: With family Time For Goal Achievement: 03/21/20 Potential to Achieve Goals: Poor ADL Goals Pt Will Perform Lower Body Dressing: with mod assist;sitting/lateral leans Pt Will Transfer to Toilet: with min assist;bedside commode;squat pivot transfer Pt Will Perform Toileting - Clothing Manipulation and hygiene: with min assist;sitting/lateral leans Additional ADL Goal #1: Patient will sit in recliner and perform 3 min grooming task. Additional ADL Goal #2: Patient will perform bed level exercises x 10 minutes as evidence of improving activity tolerance.  Plan Discharge plan needs to be updated       AM-PAC OT "6 Clicks" Daily Activity     Outcome Measure   Help from another person eating meals?: A Little Help from another person taking care of personal grooming?: A Little Help from another person toileting, which includes using toliet, bedpan, or urinal?: A Lot Help from another person bathing (including washing, rinsing, drying)?: A Lot Help from another person to put on and taking off regular upper body clothing?: A Lot Help from another person to put on and taking off regular lower body clothing?: A Lot 6 Click Score: 14    End of Session Equipment Utilized During Treatment: Oxygen;Rolling walker  OT Visit Diagnosis: Muscle weakness (generalized) (M62.81)   Activity Tolerance Other (comment);Patient limited by fatigue (Patient limited by anxiety)   Patient Left in bed;with call bell/phone within reach   Nurse Communication Mobility status        Time: 7829-5621 OT Time Calculation (min): 31 min  Charges: OT General Charges $OT Visit: 1 Visit OT Treatments $Therapeutic Activity: 23-37 mins  Marlyce Huge OT OT pager: [phone]   Carmelia Roller 03/12/2020, 3:11  PM

## 2020-03-12 NOTE — Plan of Care (Signed)
  Problem: Elimination: Goal: Will not experience complications related to urinary retention Outcome: Progressing   Problem: Pain Managment: Goal: General experience of comfort will improve Outcome: Progressing   Problem: Safety: Goal: Ability to remain free from injury will improve Outcome: Progressing   Problem: Skin Integrity: Goal: Risk for impaired skin integrity will decrease Outcome: Progressing   Problem: Respiratory: Goal: Will maintain a patent airway Outcome: Progressing Goal: Complications related to the disease process, condition or treatment will be avoided or minimized Outcome: Progressing

## 2020-03-12 NOTE — Progress Notes (Signed)
Notified provider on call due to patient having two consecutive blood sugars greater than 300. Patient did not receive any new orders. Nurse gave patient scheduled sliding scale Novolog and prescribed Lantus.

## 2020-03-13 ENCOUNTER — Inpatient Hospital Stay (HOSPITAL_COMMUNITY): Payer: 59

## 2020-03-13 LAB — BRAIN NATRIURETIC PEPTIDE: B Natriuretic Peptide: 50.5 pg/mL (ref 0.0–100.0)

## 2020-03-13 LAB — GLUCOSE, CAPILLARY
Glucose-Capillary: 208 mg/dL — ABNORMAL HIGH (ref 70–99)
Glucose-Capillary: 321 mg/dL — ABNORMAL HIGH (ref 70–99)
Glucose-Capillary: 317 mg/dL — ABNORMAL HIGH (ref 70–99)
Glucose-Capillary: 234 mg/dL — ABNORMAL HIGH (ref 70–99)

## 2020-03-13 LAB — BLOOD GAS, ARTERIAL
Acid-Base Excess: 16.2 mmol/L — ABNORMAL HIGH (ref 0.0–2.0)
Bicarbonate: 42.5 mmol/L — ABNORMAL HIGH (ref 20.0–28.0)
FIO2: 80
O2 Saturation: 90 %
Patient temperature: 98.6
pCO2 arterial: 67.1 mmHg (ref 32.0–48.0)
pH, Arterial: 7.418 (ref 7.350–7.450)
pO2, Arterial: 60.8 mmHg — ABNORMAL LOW (ref 83.0–108.0)

## 2020-03-13 LAB — BASIC METABOLIC PANEL
Anion gap: 8 (ref 5–15)
BUN: 11 mg/dL (ref 6–20)
CO2: 41 mmol/L — ABNORMAL HIGH (ref 22–32)
Calcium: 8.8 mg/dL — ABNORMAL LOW (ref 8.9–10.3)
Chloride: 88 mmol/L — ABNORMAL LOW (ref 98–111)
Creatinine, Ser: 0.5 mg/dL (ref 0.44–1.00)
GFR, Estimated: >60 mL/min (ref >60)
Glucose, Bld: 162 mg/dL — ABNORMAL HIGH (ref 70–99)
Potassium: 4.2 mmol/L (ref 3.5–5.1)
Sodium: 137 mmol/L (ref 135–145)

## 2020-03-13 LAB — D-DIMER, QUANTITATIVE: D-Dimer, Quant: 0.57 ug/mL-FEU — ABNORMAL HIGH (ref 0.00–0.50)

## 2020-03-13 LAB — CBC
HCT: 25.5 % — ABNORMAL LOW (ref 36.0–46.0)
Hemoglobin: 7.8 g/dL — ABNORMAL LOW (ref 12.0–15.0)
MCH: 29.8 pg (ref 26.0–34.0)
MCHC: 30.6 g/dL (ref 30.0–36.0)
MCV: 97.3 fL (ref 80.0–100.0)
Platelets: 331 10*3/uL (ref 150–400)
RBC: 2.62 MIL/uL — ABNORMAL LOW (ref 3.87–5.11)
RDW: 17.2 % — ABNORMAL HIGH (ref 11.5–15.5)
WBC: 19.6 10*3/uL — ABNORMAL HIGH (ref 4.0–10.5)
nRBC: 3.3 % — ABNORMAL HIGH (ref 0.0–0.2)

## 2020-03-13 LAB — PROCALCITONIN: Procalcitonin: 0.12 ng/mL

## 2020-03-13 LAB — EXPECTORATED SPUTUM ASSESSMENT W GRAM STAIN, RFLX TO RESP C

## 2020-03-13 LAB — C-REACTIVE PROTEIN: CRP: 0.7 mg/dL (ref <1.0)

## 2020-03-13 LAB — MAGNESIUM: Magnesium: 2.1 mg/dL (ref 1.7–2.4)

## 2020-03-13 LAB — PHOSPHORUS: Phosphorus: 3.8 mg/dL (ref 2.5–4.6)

## 2020-03-13 MED ORDER — INSULIN GLARGINE 100 UNIT/ML ~~LOC~~ SOLN
6.0000 [IU] | Freq: Two times a day (BID) | SUBCUTANEOUS | Status: DC
  Administered 2020-03-13 – 2020-03-14 (×2): 6 [IU] via SUBCUTANEOUS
  Filled 2020-03-13 (×3): qty 0.06

## 2020-03-13 MED ORDER — SODIUM CHLORIDE 0.9 % IV SOLN
3.0000 g | Freq: Four times a day (QID) | INTRAVENOUS | Status: DC
  Administered 2020-03-13 – 2020-03-16 (×11): 3 g via INTRAVENOUS
  Filled 2020-03-13: qty 3
  Filled 2020-03-13: qty 8
  Filled 2020-03-13: qty 3
  Filled 2020-03-13: qty 8
  Filled 2020-03-13 (×4): qty 3
  Filled 2020-03-13 (×2): qty 8
  Filled 2020-03-13 (×2): qty 3
  Filled 2020-03-13: qty 8
  Filled 2020-03-13: qty 3

## 2020-03-13 MED ORDER — MIRTAZAPINE 15 MG PO TABS
7.5000 mg | ORAL_TABLET | Freq: Every day | ORAL | Status: DC
  Administered 2020-03-13 – 2020-03-16 (×4): 7.5 mg via ORAL
  Filled 2020-03-13 (×4): qty 1

## 2020-03-13 MED ORDER — TRAZODONE HCL 50 MG PO TABS
50.0000 mg | ORAL_TABLET | Freq: Every evening | ORAL | Status: DC | PRN
  Administered 2020-03-15 – 2020-03-16 (×2): 50 mg via ORAL
  Filled 2020-03-13 (×2): qty 1

## 2020-03-13 MED ORDER — QUETIAPINE FUMARATE 25 MG PO TABS
12.5000 mg | ORAL_TABLET | Freq: Two times a day (BID) | ORAL | Status: DC
  Administered 2020-03-13 – 2020-03-15 (×4): 12.5 mg via ORAL
  Filled 2020-03-13 (×5): qty 1

## 2020-03-13 MED ORDER — MORPHINE SULFATE (PF) 2 MG/ML IV SOLN
1.0000 mg | INTRAVENOUS | Status: DC | PRN
Start: 2020-03-13 — End: 2020-03-16

## 2020-03-13 MED ORDER — LORAZEPAM 2 MG/ML IJ SOLN
0.5000 mg | Freq: Four times a day (QID) | INTRAMUSCULAR | Status: AC | PRN
  Administered 2020-03-14 (×3): 0.5 mg via INTRAVENOUS
  Filled 2020-03-13 (×3): qty 1

## 2020-03-13 MED ORDER — PIPERACILLIN-TAZOBACTAM 3.375 G IVPB: 3.3750 g | Freq: Three times a day (TID) | INTRAVENOUS | Status: DC

## 2020-03-13 MED ORDER — ENSURE ENLIVE PO LIQD
237.0000 mL | Freq: Two times a day (BID) | ORAL | Status: DC
  Administered 2020-03-15 – 2020-03-20 (×4): 237 mL via ORAL

## 2020-03-13 MED ORDER — SENNOSIDES-DOCUSATE SODIUM 8.6-50 MG PO TABS
2.0000 | ORAL_TABLET | Freq: Two times a day (BID) | ORAL | Status: DC
  Administered 2020-03-13 – 2020-03-20 (×12): 2 via ORAL
  Filled 2020-03-13 (×13): qty 2

## 2020-03-13 MED ORDER — FUROSEMIDE 10 MG/ML IJ SOLN
20.0000 mg | Freq: Once | INTRAMUSCULAR | Status: AC
  Administered 2020-03-13: 20 mg via INTRAVENOUS
  Filled 2020-03-13: qty 2

## 2020-03-13 MED ORDER — BISACODYL 10 MG RE SUPP: 10.0000 mg | Freq: Every day | RECTAL | Status: DC | PRN

## 2020-03-13 NOTE — Plan of Care (Signed)
  Problem: Health Behavior/Discharge Planning: Goal: Ability to manage health-related needs will improve 03/13/2020 1809 by Charmian Muff, RN Outcome: Progressing 03/13/2020 1808 by Charmian Muff, RN Outcome: Progressing   Problem: Clinical Measurements: Goal: Ability to maintain clinical measurements within normal limits will improve 03/13/2020 1809 by Charmian Muff, RN Outcome: Progressing 03/13/2020 1808 by Charmian Muff, RN Outcome: Progressing Goal: Will remain free from infection 03/13/2020 1809 by Charmian Muff, RN Outcome: Progressing 03/13/2020 1808 by Charmian Muff, RN Outcome: Progressing Goal: Diagnostic test results will improve 03/13/2020 1809 by Charmian Muff, RN Outcome: Progressing 03/13/2020 1808 by Charmian Muff, RN Outcome: Progressing

## 2020-03-13 NOTE — Progress Notes (Signed)
Nutrition Follow-up  RD working remotely.  DOCUMENTATION CODES:   Not applicable  INTERVENTION:  - will d/c Boost Breeze. - will order Ensure Enlive BID, each supplement provides 350 kcal and 20 grams of protein. - will order Magic Cup with lunch meals, each supplement provides 290 kcal and 9 grams of protein. - weigh patient today.  - complete NFPE when feasible.  NUTRITION DIAGNOSIS:   Increased nutrient needs related to acute illness as evidenced by estimated needs. -ongoing  GOAL:   Patient will meet greater than or equal to 90% of their needs -unmet on average  MONITOR:   PO intake,Supplement acceptance,Labs,Weight trends  REASON FOR ASSESSMENT:   Consult Assessment of nutrition requirement/status  ASSESSMENT:   60 y.o. female with medical history of type 2 DM, HTN, HLD, obesity, and depression. She presented to the ED with fever and dyspnea. She was found to be COVID-19 positive on 02/04/20. CT angio chest on  showed worsening airspace disease compatible with progressive COVID PNA and acute bilateral pulmonary emboli. CXR 1/13, 1/18, and 1/20 showed significant bilateral opacities. On 1/23 she was lethargic and severely hypercarbic so she was placed on BiPAP and transferred to the ICU. Patient is now DNR.  Diet advanced from CLD to Soft on 1/27 at 1738, changed back to CLD on 1/28 at 1211, re-advanced to Soft on 1/29 at 0948, and then to Regular on 1/29 at 1813.   Recently documented intakes were 0% of breakfast and 25% of lunch on 1/30; 0% of breakfast and 50% of lunch on 1/31; 80% of breakfast on 2/2.   She has refused all cartons of Boost Breeze.   She has not been weighed since admission on 12/27. Non-pitting edema to BLE documented in the edema section of flow sheet.  Per notes: - 7L O2 during the day, BiPAP at night and as needed - tested positive for COVID-19 on 02/04/20--continue to encourage self-proning - acute PE - type 2 DM with HgbA1c: 7.7% on  02/14/20 - ileus--resolved - generalized weakness - plan to return home at the time of d/c (possibly 2/7)   Labs reviewed; CBGs: 208, 321 mg/dl, Cl: 88 mmol/l. Medications reviewed; 500 mg ascorbic acid/day, 20 mg IV lasix x1 dose 2/3, sliding scale novolog, 8 units lantus/day, 1 tablet multivitamin with minerals/day, 17 g miralax/day, 30 mg deltasone/day, 2 tablets senokot BID, 220 mg zinc sulfate/day.     NUTRITION - FOCUSED PHYSICAL EXAM:  unable to complete at this time; patient deferred during initial assessment  Diet Order:   Diet Order            Diet regular Room service appropriate? Yes; Fluid consistency: Thin  Diet effective now                 EDUCATION NEEDS:   Not appropriate for education at this time  Skin:  Skin Assessment: Skin Integrity Issues: Skin Integrity Issues:: Other (Comment) Other: wound to buttocks (2/2)  Last BM:  1/29  Height:   Ht Readings from Last 1 Encounters:  02/04/20 5\' 8"  (1.727 m)    Weight:   Wt Readings from Last 1 Encounters:  02/04/20 98.9 kg    Estimated Nutritional Needs:  Kcal:  2300-2500 kcal Protein:  120-135 grams Fluid:  >/= 2.5 L/day      02/06/20, MS, RD, LDN, CNSC Inpatient Clinical Dietitian RD pager # available in AMION  After hours/weekend pager # available in [facility]

## 2020-03-13 NOTE — Progress Notes (Signed)
CRITICAL VALUE ALERT  Critical Value:  CO2 67.1  Date & Time Notied:  03/13/20  0900  Provider Notified: Dr. Margo Aye  Orders Received/Actions taken: awaiting response

## 2020-03-13 NOTE — Progress Notes (Signed)
Pt tol NON rebreather, decreased O2 to 13 liters salter HFNC, will cont to monitor. SRP, RN

## 2020-03-13 NOTE — Plan of Care (Signed)
  Problem: Health Behavior/Discharge Planning: Goal: Ability to manage health-related needs will improve Outcome: Progressing   Problem: Clinical Measurements: Goal: Will remain free from infection Outcome: Progressing Goal: Respiratory complications will improve Outcome: Progressing   Problem: Coping: Goal: Level of anxiety will decrease Outcome: Progressing

## 2020-03-13 NOTE — Plan of Care (Signed)
  Problem: Health Behavior/Discharge Planning: Goal: Ability to manage health-related needs will improve Outcome: Progressing   Problem: Clinical Measurements: Goal: Respiratory complications will improve Outcome: Progressing   Problem: Activity: Goal: Risk for activity intolerance will decrease Outcome: Progressing   Problem: Coping: Goal: Level of anxiety will decrease Outcome: Progressing   

## 2020-03-13 NOTE — Progress Notes (Signed)
Pharmacy Antibiotic Note  Christina Vincent is a 60 y.o. female who is known to pharmacy from anticoag. consult with this admission. Pharmacy has been consulted to start zosyn for suspected aspiration PNA.    - 2/3 CXR: Asymmetric diffuse pulmonary infiltrate predominantly within the left lung, likely Infectious.  - scr 0.50 (crcl~95)  Plan: - zosyn 3.375 gm IV q8h (infuse over 4 hrs) - With stable renal function, pharmacy will sign off for abx consult.  Reconsult [place] if need further assistance.  ______________________________________  Height: 5\' 8"  (172.7 cm) Weight: 98.9 kg (218 lb) IBW/kg (Calculated) : 63.9  Temp (24hrs), Avg:98.2 F (36.8 C), Min:97.6 F (36.4 C), Max:98.5 F (36.9 C)  Recent Labs  Lab 03/07/20 0450 03/13/20 0434  WBC 10.3 19.6*  CREATININE  --  0.50    Estimated Creatinine Clearance: 95.4 mL/min (by C-G formula based on SCr of 0.5 mg/dL).    No Known Allergies   Thank you for allowing pharmacy to be a part of this patient's care.  05/11/20 03/13/2020 12:09 PM

## 2020-03-13 NOTE — Progress Notes (Signed)
PHYSICAL THERAPY  Pt unable to tolerate today.  Was able to get OOB with nursing but unable to tolerate much after that.  Will attempt to see another day.  Felecia Shelling  PTA Acute  Rehabilitation Services Pager      [phone] Office      [phone]

## 2020-03-13 NOTE — Progress Notes (Signed)
Pt in RED MEWS Protocol throughout the day. MD and RRT made aware and orders completed. CN updated. Pt given Xanax, family arrive at bedside. Resting throughout the day. VS taken q 4 hours. SRP, RN

## 2020-03-13 NOTE — Progress Notes (Signed)
Placed pt on Non-re breather 15 liters, sats fluates b/t 83-85% on 15 liters salter high flow. Called Resp to consulted, recommended Non-rebreather and maintain Salter 15 liters. O2 sats currently 98-100%. Pt resting more comfortably will cont to monitor. SRP, RN

## 2020-03-13 NOTE — Plan of Care (Signed)
  Problem: Health Behavior/Discharge Planning: Goal: Ability to manage health-related needs will improve Outcome: Progressing   Problem: Clinical Measurements: Goal: Ability to maintain clinical measurements within normal limits will improve Outcome: Progressing Goal: Will remain free from infection Outcome: Progressing Goal: Diagnostic test results will improve Outcome: Progressing   

## 2020-03-13 NOTE — Progress Notes (Signed)
RT NOTE:  Pt was sating at 100% while on BiPAP machine this morning. RT placed pt on 15L salter to attempt O2 wean. Pt tolerated this well for about an hour with saturations of 92-97%. Pt then desated down to low 70s and upper 60s, pt became SOB and had increased WOB. RT placed pt back on BiPAP machine and pt saturations came up to 98%. RT will continue to monitor pt status.

## 2020-03-13 NOTE — Progress Notes (Addendum)
PROGRESS NOTE  Christina Vincent ZOX:[id number] DOB: 1960/09/27 DOA: 02/04/2020 PCP: Avon Gully, MD  HPI/Recap of past 24 hours: Christina Vincent a 60 y.o.femalewith a history of T2DM, HTN, HLD, obesity, depression, insomnia who presented with fever and dyspnea found to have positive SARS-CoV-2 PCR and mild hypoxia despite clear CXR on 12/27. CRP 16.6, PCT negative. Full scope treatment commenced with subsequent worsening of hypoxia. CTA chest 1/4 revealed worsening airspace disease compatible with progressive covid pneumonia as well as acute bilateral pulmonary emboli without right heart strain.  CXR repeated 1/13, 1/18 and 1/20 have continued to demonstrate significant heterogenous opacities bilaterally. Psychiatry was consulted for medical management of concomitant anxiety.  PCCM consulted for further recommendations 02/28/2020. On 1/23 she became lethargic and was noted to be severely hypercarbic, placed on BiPAP and transferred to ICU. Plan to continue BiPAP, she is DNR.  Continue full scope of medical care.  Hospital course complicated by severe hypoxia and persistent hypercarbia despite BiPAP at night.  She is currently requiring high flow nasal cannula and nonrebreather to maintain O2 saturation above 92%.  Repeated chest x-ray on 03/13/2020, personally reviewed revealing worsening left pulmonary infiltrates.  Started on Unasyn for suspected superimposed pulmonary bacterial infection.  03/13/20: Seen and examined at her bedside she is on high flow nasal cannula and eating her breakfast.  Mild conversational dyspnea noted.    Assessment/Plan: Principal Problem:   Acute hypoxemic respiratory failure due to COVID-19 [facility]) Active Problems:   2019 novel coronavirus disease (COVID-19)   Hypertension   Type 2 diabetes mellitus (HCC)   Dyslipidemia   Depression   Class 1 obesity   Pulmonary emboli (HCC)  Severe acute hypoxic hypercarbic respiratory failure, multifactorial secondary to  COVID-19 viral pneumonia, suspected superimposed bacterial pulmonary infection, recently diagnosed bilateral pulmonary emboli (02/12/20).  Continue to maintain O2 saturation greater than 90% Currently on high flow nasal cannula along with nonrebreather to maintain O2 saturation greater than 92% Continue BiPAP at night with breaks during the day to allow for feeding and use of incentive spirometer/flutter valve. Continue bronchodilators Out of bed to chair as tolerated Mobilize as tolerated  COVID-19 viral pneumonia, suspect superimposed by bacterial pulmonary infection, POA, ? aspiration -Tested positive on 02/04/2020.  Off isolation -Completed full course of Remdesivir, steroid, baricitinib -Continue to encourage IS, encourage prone positioning, encourage mobilization as tolerated -Restarted on steroids due to continued respiratory failure  -Obtain sputum culture -Started Unasyn, continue -Continue Pulmicort, Brovana, albuterol -Inflammatory markers are improving, D-dimer 0.57 from greater than 20, CRP 0.7 from 2.9. When stable will obtain swallow evaluation, continue to closely monitor Aspiration precautions  Acute bilateral PE Continue full dose Lovenox twice daily.  Chronic constipation No bowel movement in days Continue bowel regimen: Senokot 2 tablet twice daily, MiraLAX daily, Dulcolax suppository as needed Patient has declined enema  Diabetes mellitus type 2 with hyperglycemia Increase Lantus to 6 units twice daily Continue insulin sliding scale. Hemoglobin A1c 7.7 on 02/14/2020  Chronic anxiety/depression/insomnia  She was on Remeron PTA, continue as recommended by psychiatry DC Xanax as recommended by psychiatry Avoid using benzodiazepines, wean off as able. Start Seroquel 12.5 mg twice daily as recommended by psychiatry for anxiety and hallucinations Obtain a twelve-lead EKG to assess QTC. -Appreciate psychiatry assistance  Obesity -Estimated body mass index is  33.15 kg/m as calculated from the following:   Height as of this encounter: 5\' 8"  (1.727 m).   Weight as of this encounter: 98.9 kg.  Abnormal TSH, subclinical hyperthyroidism  Normal Free T4, TSH 0.308. -Repeat as outpatient in 4 to 6 weeks when acute illness has resolved  Generalized weakness/ambulatory dysfunction PT to assess Out of bed to chair as tolerated Mobilize as tolerated Continue fall precautions   DVT prophylaxis: Full dose Lovenox subcu twice daily.   Code Status: DNR Family Communicati updated her husband and daughter on telephone.      Status is: Inpatient    Dispo:  Patient From:    Planned Disposition: Home  Expected discharge date: 03/17/2020  Medically stable for discharge:  No, ongoing management of COVID-19 viral pneumonia, suspected superimposed bacterial pulmonary infection, and acute hypoxic hypercarbic respiratory failure.          Objective: Vitals:   03/13/20 0900 03/13/20 1000 03/13/20 1100 03/13/20 1102  BP:    98/69  Pulse:    (!) 112  Resp: (!) 52 (!) 36 (!) 40 (!) 35  Temp:    97.6 F (36.4 C)  TempSrc:    Oral  SpO2:    100%  Weight:      Height:        Intake/Output Summary (Last 24 hours) at 03/13/2020 1544 Last data filed at 03/13/2020 0500 Gross per 24 hour  Intake 250 ml  Output 920 ml  Net -670 ml   Filed Weights   02/04/20 1838  Weight: 98.9 kg    Exam:  . General: 60 y.o. year-old female chronically ill-appearing in no acute distress.  On high flow nasal cannula.  Alert and oriented x3.   . Cardiovascular: Regular rate and rhythm no rubs or gallops. Marland Kitchen Respiratory: Rales noted left lungs.  No wheezing noted.  Poor inspiratory effort.   . Abdomen: Obese soft nontender bowel sounds present. . Musculoskeletal: No lower extremity edema bilaterally. . Skin: No ulcerative lesions noted.   Marland Kitchen Psychiatry: Mood is appropriate for condition and setting.   Data Reviewed: CBC: Recent Labs  Lab 03/07/20 0450  03/13/20 0434  WBC 10.3 19.6*  HGB 10.3* 7.8*  HCT 34.7* 25.5*  MCV 95.3 97.3  PLT 439* 331   Basic Metabolic Panel: Recent Labs  Lab 03/13/20 0434  NA 137  K 4.2  CL 88*  CO2 41*  GLUCOSE 162*  BUN 11  CREATININE 0.50  CALCIUM 8.8*  MG 2.1  PHOS 3.8   GFR: Estimated Creatinine Clearance: 95.4 mL/min (by C-G formula based on SCr of 0.5 mg/dL). Liver Function Tests: No results for input(s): AST, ALT, ALKPHOS, BILITOT, PROT, ALBUMIN in the last 168 hours. No results for input(s): LIPASE, AMYLASE in the last 168 hours. No results for input(s): AMMONIA in the last 168 hours. Coagulation Profile: No results for input(s): INR, PROTIME in the last 168 hours. Cardiac Enzymes: No results for input(s): CKTOTAL, CKMB, CKMBINDEX, TROPONINI in the last 168 hours. BNP (last 3 results) No results for input(s): PROBNP in the last 8760 hours. HbA1C: No results for input(s): HGBA1C in the last 72 hours. CBG: Recent Labs  Lab 03/12/20 1245 03/12/20 1722 03/12/20 2049 03/13/20 0835 03/13/20 1236  GLUCAP 303* 277* 212* 208* 321*   Lipid Profile: No results for input(s): CHOL, HDL, LDLCALC, TRIG, CHOLHDL, LDLDIRECT in the last 72 hours. Thyroid Function Tests: No results for input(s): TSH, T4TOTAL, FREET4, T3FREE, THYROIDAB in the last 72 hours. Anemia Panel: No results for input(s): VITAMINB12, FOLATE, FERRITIN, TIBC, IRON, RETICCTPCT in the last 72 hours. Urine analysis: No results found for: COLORURINE, APPEARANCEUR, LABSPEC, PHURINE, GLUCOSEU, HGBUR, BILIRUBINUR, KETONESUR, PROTEINUR, UROBILINOGEN, NITRITE, LEUKOCYTESUR Sepsis  Labs: @LABRCNTIP (procalcitonin:4,lacticidven:4)  )No results found for this or any previous visit (from the past 240 hour(s)).    Studies: DG CHEST PORT 1 VIEW  Result Date: 03/13/2020 CLINICAL DATA:  COVID pneumonia, respiratory failure EXAM: PORTABLE CHEST 1 VIEW COMPARISON:  03/11/2020 FINDINGS: Lung volumes are small, but are symmetric and are  stable since prior examination. Superimposed asymmetric pulmonary infiltrates largely diffusely throughout the left lung is again seen, likely infectious. No pneumothorax or pleural effusion. Cardiac size within normal limits. IMPRESSION: Stable examination. Pulmonary hypoinflation. Asymmetric diffuse pulmonary infiltrate predominantly within the left lung, likely infectious. Electronically Signed   By: 05/09/2020 MD   On: 03/13/2020 11:41    Scheduled Meds: . arformoterol  15 mcg Nebulization BID  . vitamin C  500 mg Oral Daily  . benzonatate  200 mg Oral BID  . bisacodyl  10 mg Rectal Once  . budesonide  0.5 mg Nebulization BID  . chlorhexidine  15 mL Mouth Rinse BID  . Chlorhexidine Gluconate Cloth  6 each Topical Daily  . enoxaparin (LOVENOX) injection  100 mg Subcutaneous BID  . feeding supplement  237 mL Oral BID BM  . insulin aspart  0-20 Units Subcutaneous TID WC  . insulin aspart  0-5 Units Subcutaneous QHS  . insulin glargine  8 Units Subcutaneous QHS  . mouth rinse  15 mL Mouth Rinse q12n4p  . mirtazapine  15 mg Oral QHS  . multivitamin with minerals  1 tablet Oral Daily  . polyethylene glycol  17 g Oral Daily  . predniSONE  30 mg Oral Q breakfast  . senna-docusate  2 tablet Oral BID  . traZODone  50 mg Oral QHS  . zinc sulfate  220 mg Oral Daily    Continuous Infusions: . ampicillin-sulbactam (UNASYN) IV       LOS: 37 days     05/11/2020, MD [facility] Pager [phone]  If 7PM-7AM, please contact night-coverage [email or web address] Password [facility] 03/13/2020, 3:44 PM

## 2020-03-13 NOTE — Progress Notes (Signed)
Pt agreeable to Senekot-S, refused Soap sud enema. SRP, RN

## 2020-03-14 ENCOUNTER — Encounter (HOSPITAL_COMMUNITY): Payer: Self-pay | Admitting: Internal Medicine

## 2020-03-14 ENCOUNTER — Inpatient Hospital Stay (HOSPITAL_COMMUNITY): Payer: 59

## 2020-03-14 LAB — CBC WITH DIFFERENTIAL/PLATELET
Abs Immature Granulocytes: 0.61 10*3/uL — ABNORMAL HIGH (ref 0.00–0.07)
Basophils Absolute: 0 10*3/uL (ref 0.0–0.1)
Basophils Relative: 0 %
Eosinophils Absolute: 0 10*3/uL (ref 0.0–0.5)
Eosinophils Relative: 0 %
HCT: 25.3 % — ABNORMAL LOW (ref 36.0–46.0)
Hemoglobin: 7.5 g/dL — ABNORMAL LOW (ref 12.0–15.0)
Immature Granulocytes: 4 %
Lymphocytes Relative: 13 %
Lymphs Abs: 2.1 10*3/uL (ref 0.7–4.0)
MCH: 29.4 pg (ref 26.0–34.0)
MCHC: 29.6 g/dL — ABNORMAL LOW (ref 30.0–36.0)
MCV: 99.2 fL (ref 80.0–100.0)
Monocytes Absolute: 1.1 10*3/uL — ABNORMAL HIGH (ref 0.1–1.0)
Monocytes Relative: 7 %
Neutro Abs: 12 10*3/uL — ABNORMAL HIGH (ref 1.7–7.7)
Neutrophils Relative %: 76 %
Platelets: 334 10*3/uL (ref 150–400)
RBC: 2.55 MIL/uL — ABNORMAL LOW (ref 3.87–5.11)
RDW: 17.7 % — ABNORMAL HIGH (ref 11.5–15.5)
WBC: 15.8 10*3/uL — ABNORMAL HIGH (ref 4.0–10.5)
nRBC: 2.6 % — ABNORMAL HIGH (ref 0.0–0.2)

## 2020-03-14 LAB — GLUCOSE, CAPILLARY
Glucose-Capillary: 163 mg/dL — ABNORMAL HIGH (ref 70–99)
Glucose-Capillary: 380 mg/dL — ABNORMAL HIGH (ref 70–99)
Glucose-Capillary: 150 mg/dL — ABNORMAL HIGH (ref 70–99)
Glucose-Capillary: 185 mg/dL — ABNORMAL HIGH (ref 70–99)

## 2020-03-14 LAB — FERRITIN: Ferritin: 310 ng/mL — ABNORMAL HIGH (ref 11–307)

## 2020-03-14 LAB — IRON AND TIBC
Iron: 75 ug/dL (ref 28–170)
Saturation Ratios: 24 % (ref 10.4–31.8)
TIBC: 310 ug/dL (ref 250–450)
UIBC: 235 ug/dL

## 2020-03-14 LAB — PREPARE RBC (CROSSMATCH)

## 2020-03-14 LAB — BASIC METABOLIC PANEL
Anion gap: 12 (ref 5–15)
BUN: 13 mg/dL (ref 6–20)
CO2: 38 mmol/L — ABNORMAL HIGH (ref 22–32)
Calcium: 8.9 mg/dL (ref 8.9–10.3)
Chloride: 88 mmol/L — ABNORMAL LOW (ref 98–111)
Creatinine, Ser: 0.41 mg/dL — ABNORMAL LOW (ref 0.44–1.00)
GFR, Estimated: >60 mL/min (ref >60)
Glucose, Bld: 177 mg/dL — ABNORMAL HIGH (ref 70–99)
Potassium: 4 mmol/L (ref 3.5–5.1)
Sodium: 138 mmol/L (ref 135–145)

## 2020-03-14 LAB — HEMOGLOBIN AND HEMATOCRIT, BLOOD
HCT: 23.7 % — ABNORMAL LOW (ref 36.0–46.0)
Hemoglobin: 7.2 g/dL — ABNORMAL LOW (ref 12.0–15.0)

## 2020-03-14 LAB — ABO/RH: ABO/RH(D): A POS

## 2020-03-14 MED ORDER — VITAMIN D 25 MCG (1000 UNIT) PO TABS
1000.0000 [IU] | ORAL_TABLET | Freq: Every day | ORAL | Status: DC
  Administered 2020-03-14 – 2020-03-21 (×8): 1000 [IU] via ORAL
  Filled 2020-03-14 (×8): qty 1

## 2020-03-14 MED ORDER — INSULIN ASPART 100 UNIT/ML ~~LOC~~ SOLN
3.0000 [IU] | Freq: Three times a day (TID) | SUBCUTANEOUS | Status: DC
  Administered 2020-03-15 – 2020-03-18 (×7): 3 [IU] via SUBCUTANEOUS

## 2020-03-14 MED ORDER — IOHEXOL 9 MG/ML PO SOLN
ORAL | Status: AC
  Administered 2020-03-14: 500 mL
  Filled 2020-03-14: qty 1000

## 2020-03-14 MED ORDER — IOHEXOL 9 MG/ML PO SOLN
500.0000 mL | ORAL | Status: AC
Start: 2020-03-14 — End: 2020-03-14
  Administered 2020-03-14 (×2): 500 mL via ORAL

## 2020-03-14 MED ORDER — INSULIN GLARGINE 100 UNIT/ML ~~LOC~~ SOLN
4.0000 [IU] | Freq: Two times a day (BID) | SUBCUTANEOUS | Status: DC
  Administered 2020-03-14 – 2020-03-18 (×10): 4 [IU] via SUBCUTANEOUS
  Filled 2020-03-14 (×10): qty 0.04

## 2020-03-14 MED ORDER — ENOXAPARIN SODIUM 100 MG/ML ~~LOC~~ SOLN
100.0000 mg | Freq: Two times a day (BID) | SUBCUTANEOUS | Status: DC
  Administered 2020-03-15: 100 mg via SUBCUTANEOUS
  Filled 2020-03-14: qty 1

## 2020-03-14 MED ORDER — SODIUM CHLORIDE 0.9% IV SOLUTION: Freq: Once | INTRAVENOUS | Status: DC

## 2020-03-14 MED ORDER — FUROSEMIDE 10 MG/ML IJ SOLN
20.0000 mg | Freq: Once | INTRAMUSCULAR | Status: AC
  Administered 2020-03-14: 20 mg via INTRAVENOUS
  Filled 2020-03-14: qty 2

## 2020-03-14 MED ORDER — INSULIN ASPART 100 UNIT/ML ~~LOC~~ SOLN
7.0000 [IU] | Freq: Three times a day (TID) | SUBCUTANEOUS | Status: DC
  Administered 2020-03-14: 7 [IU] via SUBCUTANEOUS

## 2020-03-14 NOTE — Progress Notes (Signed)
PROGRESS NOTE  Christina Vincent HXT:[id number] DOB: 17-Feb-1960 DOA: 02/04/2020 PCP: Avon Gully, MD  HPI/Recap of past 24 hours: Malia D Siddleis a 60 y.o.femalewith a history of T2DM, HTN, HLD, obesity, depression, insomnia who presented with fever and dyspnea found to have positive SARS-CoV-2 PCR and mild hypoxia despite clear CXR on 12/27. CRP 16.6, PCT negative. Full scope treatment commenced with subsequent worsening of hypoxia. CTA chest 1/4 revealed worsening airspace disease compatible with progressive covid pneumonia as well as acute bilateral pulmonary emboli without right heart strain.  CXR repeated 1/13, 1/18 and 1/20 have continued to demonstrate significant heterogenous opacities bilaterally. Psychiatry was consulted for medical management of concomitant anxiety.  PCCM consulted for further recommendations 02/28/2020. On 1/23 she became lethargic and was noted to be severely hypercarbic, placed on BiPAP and transferred to ICU. Plan to continue BiPAP, she is DNR.  Continue full scope of medical care.  Hospital course complicated by severe hypoxia and persistent hypercarbia despite BiPAP at night.  She is currently requiring high flow nasal cannula and nonrebreather to maintain O2 saturation above 92%.  Repeated chest x-ray on 03/13/2020, personally reviewed revealing worsening left pulmonary infiltrates.  Started on Unasyn for suspected superimposed pulmonary bacterial infection.  03/14/20: Seen and examined at bedside.  She wore BiPAP last night.  Weaning off oxygen supplementation as tolerated.  Currently on high flow nasal cannula and nonrebreather.  Hemoglobin dropped down to 7.2 this afternoon.  No overt bleeding noted by nursing staff.  Received consent to transfuse 1 unit PRBC.  Patient is on full dose subcu Lovenox twice daily, will obtain CT abdomen and pelvis to rule out any intra-abdominal hemorrhage.  Assessment/Plan: Principal Problem:   Acute hypoxemic respiratory failure  due to COVID-19 [facility]) Active Problems:   2019 novel coronavirus disease (COVID-19)   Hypertension   Type 2 diabetes mellitus (HCC)   Dyslipidemia   Depression   Class 1 obesity   Pulmonary emboli (HCC)  Severe acute hypoxic hypercarbic respiratory failure, multifactorial secondary to COVID-19 viral pneumonia, suspected superimposed bacterial pulmonary infection, recently diagnosed bilateral pulmonary emboli (02/12/20).  Not on oxygen supplementation at baseline. Continue to maintain O2 saturation greater than 90% Currently on high flow nasal cannula along with nonrebreather to maintain O2 saturation greater than 92% Continue BiPAP at night with breaks during the day to allow for feeding and use of incentive spirometer/flutter valve. Wean off oxygen supplementation as tolerated. Continue bronchodilators, incentive spirometer and flutter valve Continue side sleeping or prone sleeping as tolerated Out of bed to chair as tolerated Mobilize as tolerated PT OT assistance is appreciated.  Acute blood loss anemia, unclear etiology Hemoglobin down-trending, 7.2 from 7.5 this morning. Will transfuse 1 unit PRBC on 03/14/2020, consent obtained from the patient. Baseline hemoglobin 11.9. Iron studies essentially unremarkable No overt bleeding CT abdomen pelvis to rule out intra-abdominal hemorrhage.  COVID-19 viral pneumonia, suspect superimposed by bacterial pulmonary infection, POA, ? aspiration -Tested positive on 02/04/2020.  Off isolation -Completed full course of Remdesivir and baricitinib -Continue to encourage IS, encourage prone positioning, encourage mobilization as tolerated -Restarted on steroids due to continued respiratory failure  -Follow sputum culture results. -Continue Unasyn day #2. -Continue Pulmicort, Brovana, albuterol -Inflammatory markers are improving, D-dimer 0.57 from greater than 20, CRP 0.7 from 2.9. When stable will obtain swallow evaluation, continue to closely  monitor Continue aspiration precautions  Acute bilateral PE Currently on full dose subcu Lovenox twice daily. Closely monitor H&H while on anticoagulation.  Resolved constipation Had a  bowel movement on 03/13/2020. Continue bowel regimen: Senokot 2 tablet twice daily, MiraLAX daily, Dulcolax suppository as needed  Diabetes mellitus type 2 with hyperglycemia Increase Lantus to 6 units twice daily Started NovoLog 5 units 3 times daily. Continue insulin sliding scale. Hemoglobin A1c 7.7 on 02/14/2020  Chronic anxiety/depression/insomnia  She was on Remeron PTA, continue as recommended by psychiatry, 7.5 mg nightly. DC Xanax as recommended by psychiatry Avoid using benzodiazepines, wean off as able. Continue Seroquel 12.5 mg twice daily as recommended by psychiatry for anxiety and hallucinations QTC 422 on 03/13/2020.  Obesity -Estimated body mass index is 33.15 kg/m as calculated from the following:   Height as of this encounter: 5\' 8"  (1.727 m).   Weight as of this encounter: 98.9 kg. When stable outpatient regular physical activity and healthy dieting.  Abnormal TSH, subclinical hyperthyroidism Normal Free T4, TSH 0.308. -Repeat as outpatient in 4 to 6 weeks when acute illness has resolved  Generalized weakness/ambulatory dysfunction PT OT assessment. Out of bed to chair as tolerated Mobilize as tolerated Continue fall precautions   DVT prophylaxis: Full dose Lovenox subcu twice daily.   Code Status: DNR Family Communicati updated her husband via phone on 03/14/2020.    Status is: Inpatient    Dispo:  Patient From:    Planned Disposition: Home  Expected discharge date: 03/17/2020  Medically stable for discharge:  No, ongoing management of COVID-19 viral pneumonia, suspected superimposed bacterial pulmonary infection, acute hypoxic hypercarbic respiratory failure, and acute blood loss anemia.          Objective: Vitals:   03/14/20 0415 03/14/20 0916  03/14/20 1104 03/14/20 1432  BP: 98/64  100/84 117/69  Pulse: 90  (!) 108 (!) 109  Resp: (!) 28  (!) 31 (!) 30  Temp: 98.3 F (36.8 C)  98.2 F (36.8 C) 97.9 F (36.6 C)  TempSrc: Rectal  Oral Oral  SpO2: 99% 100% 100% 100%  Weight:      Height:        Intake/Output Summary (Last 24 hours) at 03/14/2020 1438 Last data filed at 03/14/2020 0414 Gross per 24 hour  Intake 300 ml  Output 1300 ml  Net -1000 ml   Filed Weights   02/04/20 1838  Weight: 98.9 kg    Exam:  . General: 60 y.o. year-old female chronically ill-appearing no acute distress.  Alert and oriented x3.  . Cardiovascular: Tachycardic with no rubs or gallops. 60 Respiratory: Mild rales at bases no wheezing noted.  Good respiratory effort.   . Abdomen: Obese soft nontender bowel sounds present. . Musculoskeletal: No lower extremity edema bilaterally. . Skin: No ulcerative lesions noted. Marland Kitchen Psychiatry: Mood is appropriate for condition and setting.  Data Reviewed: CBC: Recent Labs  Lab 03/13/20 0434 03/14/20 0747 03/14/20 1357  WBC 19.6* 15.8*  --   NEUTROABS  --  12.0*  --   HGB 7.8* 7.5* 7.2*  HCT 25.5* 25.3* 23.7*  MCV 97.3 99.2  --   PLT 331 334  --    Basic Metabolic Panel: Recent Labs  Lab 03/13/20 0434 03/14/20 0747  NA 137 138  K 4.2 4.0  CL 88* 88*  CO2 41* 38*  GLUCOSE 162* 177*  BUN 11 13  CREATININE 0.50 0.41*  CALCIUM 8.8* 8.9  MG 2.1  --   PHOS 3.8  --    GFR: Estimated Creatinine Clearance: 95.4 mL/min (A) (by C-G formula based on SCr of 0.41 mg/dL (L)). Liver Function Tests: No results for input(s): AST,  ALT, ALKPHOS, BILITOT, PROT, ALBUMIN in the last 168 hours. No results for input(s): LIPASE, AMYLASE in the last 168 hours. No results for input(s): AMMONIA in the last 168 hours. Coagulation Profile: No results for input(s): INR, PROTIME in the last 168 hours. Cardiac Enzymes: No results for input(s): CKTOTAL, CKMB, CKMBINDEX, TROPONINI in the last 168 hours. BNP (last 3  results) No results for input(s): PROBNP in the last 8760 hours. HbA1C: No results for input(s): HGBA1C in the last 72 hours. CBG: Recent Labs  Lab 03/13/20 1236 03/13/20 1554 03/13/20 2057 03/14/20 0838 03/14/20 1144  GLUCAP 321* 317* 234* 163* 380*   Lipid Profile: No results for input(s): CHOL, HDL, LDLCALC, TRIG, CHOLHDL, LDLDIRECT in the last 72 hours. Thyroid Function Tests: No results for input(s): TSH, T4TOTAL, FREET4, T3FREE, THYROIDAB in the last 72 hours. Anemia Panel: Recent Labs    03/14/20 0743  FERRITIN 310*  TIBC 310  IRON 75   Urine analysis: No results found for: COLORURINE, APPEARANCEUR, LABSPEC, PHURINE, GLUCOSEU, HGBUR, BILIRUBINUR, KETONESUR, PROTEINUR, UROBILINOGEN, NITRITE, LEUKOCYTESUR Sepsis Labs: @LABRCNTIP (procalcitonin:4,lacticidven:4)  ) Recent Results (from the past 240 hour(s))  Expectorated sputum assessment w rflx to resp cult     Status: None   Collection Time: 03/13/20  4:42 PM   Specimen: Expectorated Sputum  Result Value Ref Range Status   Specimen Description EXPECTORATED SPUTUM  Final   Special Requests NONE  Final   Sputum evaluation   Final    THIS SPECIMEN IS ACCEPTABLE FOR SPUTUM CULTURE Performed at [facility], [address], [place], [place] [address]    Report Status 03/13/2020 FINAL  Final  Culture, respiratory     Status: None (Preliminary result)   Collection Time: 03/13/20  4:42 PM  Result Value Ref Range Status   Specimen Description   Final    EXPECTORATED SPUTUM Performed at [facility], [address] [place], [place] [address]    Special Requests   Final    NONE Reflexed from [id number] Performed at [facility], [address], [place], [place] [address]    Gram Stain   Final    FEW WBC PRESENT, PREDOMINANTLY PMN MODERATE GRAM POSITIVE COCCI IN PAIRS IN CLUSTERS MODERATE GRAM POSITIVE RODS Performed at [facility], [address],  [place], [place] [address]    Culture PENDING  Incomplete   Report Status PENDING  Incomplete      Studies: No results found.  Scheduled Meds: . sodium chloride   Intravenous Once  . arformoterol  15 mcg Nebulization BID  . vitamin C  500 mg Oral Daily  . benzonatate  200 mg Oral BID  . bisacodyl  10 mg Rectal Once  . budesonide  0.5 mg Nebulization BID  . chlorhexidine  15 mL Mouth Rinse BID  . Chlorhexidine Gluconate Cloth  6 each Topical Daily  . cholecalciferol  1,000 Units Oral Daily  . enoxaparin (LOVENOX) injection  100 mg Subcutaneous BID  . feeding supplement  237 mL Oral BID BM  . furosemide  20 mg Intravenous Once  . insulin aspart  0-20 Units Subcutaneous TID WC  . insulin aspart  0-5 Units Subcutaneous QHS  . insulin aspart  7 Units Subcutaneous TID WC  . insulin glargine  6 Units Subcutaneous BID  . mouth rinse  15 mL Mouth Rinse q12n4p  . mirtazapine  7.5 mg Oral QHS  . multivitamin with minerals  1 tablet Oral Daily  . polyethylene glycol  17 g Oral Daily  . predniSONE  30 mg Oral Q breakfast  . QUEtiapine  12.5 mg Oral BID  . senna-docusate  2 tablet Oral BID  . zinc sulfate  220 mg Oral Daily    Continuous Infusions: . ampicillin-sulbactam (UNASYN) IV 3 g (03/14/20 0943)     LOS: 38 days     Darlin Drop, MD [facility] Pager [phone]  If 7PM-7AM, please contact night-coverage [email or web address] Password [email or web address] 03/14/2020, 2:38 PM

## 2020-03-14 NOTE — Progress Notes (Signed)
Failed attempt at MRI. Spoke with RN, pt unable to come off pump at this time and RN unable to come down to switch to MRI safe pump. RN suggested trying her tomorrow.

## 2020-03-14 NOTE — Progress Notes (Signed)
PT Cancellation Note  Patient Details Name: Christina Vincent MRN: [id number] DOB: 22-Apr-1960   Cancelled Treatment:     PT deferred this pm.  Pt scheduled for transfusion and CT scan.  Will follow in am.   Josepha Barbier 03/14/2020, 5:37 PM

## 2020-03-14 NOTE — Progress Notes (Signed)
Possible intramuscular hematoma involving the pelvic muscles.   Obtaining MRI pelvis with and without contrast to further assess.  Will hold off SQ Lovenox tonight and closely monitor.

## 2020-03-14 NOTE — Plan of Care (Signed)
  Problem: Health Behavior/Discharge Planning: Goal: Ability to manage health-related needs will improve Outcome: Progressing   Problem: Clinical Measurements: Goal: Ability to maintain clinical measurements within normal limits will improve Outcome: Progressing   Problem: Coping: Goal: Level of anxiety will decrease Outcome: Progressing   Problem: Elimination: Goal: Will not experience complications related to bowel motility Outcome: Progressing Goal: Will not experience complications related to urinary retention Outcome: Progressing   Problem: Pain Managment: Goal: General experience of comfort will improve Outcome: Progressing   Problem: Safety: Goal: Ability to remain free from injury will improve Outcome: Progressing   Problem: Clinical Measurements: Goal: Respiratory complications will improve Outcome: Not Progressing Remains on 13L HFNC with NRB, unable to wean this AM   Problem: Activity: Goal: Risk for activity intolerance will decrease Outcome: Not Progressing Still severe DOE, PT consulted

## 2020-03-15 ENCOUNTER — Inpatient Hospital Stay (HOSPITAL_COMMUNITY): Payer: 59

## 2020-03-15 DIAGNOSIS — T148XXA Other injury of unspecified body region, initial encounter: Secondary | ICD-10-CM

## 2020-03-15 DIAGNOSIS — D62 Acute posthemorrhagic anemia: Secondary | ICD-10-CM

## 2020-03-15 LAB — CBC WITH DIFFERENTIAL/PLATELET
Abs Immature Granulocytes: 0.38 10*3/uL — ABNORMAL HIGH (ref 0.00–0.07)
Basophils Absolute: 0 10*3/uL (ref 0.0–0.1)
Basophils Relative: 0 %
Eosinophils Absolute: 0 10*3/uL (ref 0.0–0.5)
Eosinophils Relative: 0 %
HCT: 26.4 % — ABNORMAL LOW (ref 36.0–46.0)
Hemoglobin: 8.2 g/dL — ABNORMAL LOW (ref 12.0–15.0)
Immature Granulocytes: 2 %
Lymphocytes Relative: 14 %
Lymphs Abs: 2.2 10*3/uL (ref 0.7–4.0)
MCH: 30.6 pg (ref 26.0–34.0)
MCHC: 31.1 g/dL (ref 30.0–36.0)
MCV: 98.5 fL (ref 80.0–100.0)
Monocytes Absolute: 1 10*3/uL (ref 0.1–1.0)
Monocytes Relative: 7 %
Neutro Abs: 12 10*3/uL — ABNORMAL HIGH (ref 1.7–7.7)
Neutrophils Relative %: 77 %
Platelets: 285 10*3/uL (ref 150–400)
RBC: 2.68 MIL/uL — ABNORMAL LOW (ref 3.87–5.11)
RDW: 17.2 % — ABNORMAL HIGH (ref 11.5–15.5)
WBC: 15.7 10*3/uL — ABNORMAL HIGH (ref 4.0–10.5)
nRBC: 2.2 % — ABNORMAL HIGH (ref 0.0–0.2)

## 2020-03-15 LAB — BPAM RBC
Blood Product Expiration Date: 202202242359
ISSUE DATE / TIME: 202202041838
Unit Type and Rh: 6200

## 2020-03-15 LAB — COMPREHENSIVE METABOLIC PANEL
ALT: 67 U/L — ABNORMAL HIGH (ref 0–44)
AST: 32 U/L (ref 15–41)
Albumin: 2.8 g/dL — ABNORMAL LOW (ref 3.5–5.0)
Alkaline Phosphatase: 66 U/L (ref 38–126)
Anion gap: 10 (ref 5–15)
BUN: 10 mg/dL (ref 6–20)
CO2: 39 mmol/L — ABNORMAL HIGH (ref 22–32)
Calcium: 8.9 mg/dL (ref 8.9–10.3)
Chloride: 89 mmol/L — ABNORMAL LOW (ref 98–111)
Creatinine, Ser: 0.47 mg/dL (ref 0.44–1.00)
GFR, Estimated: >60 mL/min (ref >60)
Glucose, Bld: 146 mg/dL — ABNORMAL HIGH (ref 70–99)
Potassium: 4.4 mmol/L (ref 3.5–5.1)
Sodium: 138 mmol/L (ref 135–145)
Total Bilirubin: 0.7 mg/dL (ref 0.3–1.2)
Total Protein: 5.9 g/dL — ABNORMAL LOW (ref 6.5–8.1)

## 2020-03-15 LAB — TYPE AND SCREEN
ABO/RH(D): A POS
Antibody Screen: NEGATIVE
Unit division: 0

## 2020-03-15 LAB — HEMOGLOBIN AND HEMATOCRIT, BLOOD
HCT: 29.1 % — ABNORMAL LOW (ref 36.0–46.0)
HCT: 28.1 % — ABNORMAL LOW (ref 36.0–46.0)
Hemoglobin: 9 g/dL — ABNORMAL LOW (ref 12.0–15.0)
Hemoglobin: 8.6 g/dL — ABNORMAL LOW (ref 12.0–15.0)

## 2020-03-15 LAB — GLUCOSE, CAPILLARY
Glucose-Capillary: 157 mg/dL — ABNORMAL HIGH (ref 70–99)
Glucose-Capillary: 281 mg/dL — ABNORMAL HIGH (ref 70–99)

## 2020-03-15 LAB — MAGNESIUM: Magnesium: 2.1 mg/dL (ref 1.7–2.4)

## 2020-03-15 LAB — PHOSPHORUS: Phosphorus: 4.3 mg/dL (ref 2.5–4.6)

## 2020-03-15 MED ORDER — LORAZEPAM 2 MG/ML IJ SOLN
0.5000 mg | Freq: Four times a day (QID) | INTRAMUSCULAR | Status: DC | PRN
  Administered 2020-03-15: 0.5 mg via INTRAVENOUS
  Filled 2020-03-15: qty 1

## 2020-03-15 MED ORDER — GUAIFENESIN-CODEINE 100-10 MG/5ML PO SOLN
10.0000 mL | ORAL | Status: DC | PRN
  Administered 2020-03-15 – 2020-03-20 (×11): 10 mL via ORAL
  Filled 2020-03-15 (×12): qty 10

## 2020-03-15 MED ORDER — POLYETHYLENE GLYCOL 3350 17 G PO PACK
17.0000 g | PACK | Freq: Two times a day (BID) | ORAL | Status: DC
  Administered 2020-03-15 – 2020-03-16 (×2): 17 g via ORAL
  Filled 2020-03-15 (×4): qty 1

## 2020-03-15 MED ORDER — QUETIAPINE FUMARATE 25 MG PO TABS
25.0000 mg | ORAL_TABLET | Freq: Two times a day (BID) | ORAL | Status: DC
Start: 2020-03-15 — End: 2020-03-22
  Administered 2020-03-15 – 2020-03-21 (×12): 25 mg via ORAL
  Filled 2020-03-15 (×12): qty 1

## 2020-03-15 MED ORDER — GADOBUTROL 1 MMOL/ML IV SOLN
8.0000 mL | Freq: Once | INTRAVENOUS | Status: AC | PRN
  Administered 2020-03-15: 8 mL via INTRAVENOUS

## 2020-03-15 NOTE — Progress Notes (Signed)
Pt SD status canceled by MD, pt status Progressive, report to night RN and will continue monitor. SRP< RN

## 2020-03-15 NOTE — Progress Notes (Signed)
PROGRESS NOTE  Dava NajjarRetha D Schellhase ZOX:[id number] DOB: 01/22/1961 DOA: 02/04/2020 PCP: Avon GullyFanta, Tesfaye, MD  HPI/Recap of past 24 hours: Chrystle D Siddleis a 60 y.o.femalewith a history of T2DM, HTN, HLD, obesity, chronic depression/anxiety, insomnia who presented with fever and dyspnea found to have positive SARS-CoV-2 PCR and mild hypoxia despite clear CXR on 12/27. CRP 16.6, PCT negative. Full scope treatment commenced with subsequent worsening of hypoxia. CTA chest 1/4 revealed worsening airspace disease compatible with progressive covid pneumonia as well as acute bilateral pulmonary emboli without right heart strain.  CXR repeated 1/13, 1/18 and 1/20 significant heterogenous opacities bilaterally. Psychiatry was consulted for medical management of concomitant anxiety.  PCCM consulted for further recommendations 02/28/2020. On 1/23 she became lethargic and was noted to be severely hypercarbic, placed on BiPAP and transferred to ICU.  Hospital course complicated by severe hypoxia and persistent hypercarbia despite BiPAP at night.  Initially on board nonrebreather and high flow nasal cannula in order to maintain O2 saturation greater than 90%, weaning off oxygen supplementation as tolerated.  Repeated chest x-ray on 03/13/2020, personally reviewed revealed worsening left pulmonary infiltrates.  Started on Unasyn for suspected superimposed pulmonary bacterial infection.  Dropping hemoglobin down to 7.2 on 03/14/2020, transfuse 1 unit PRBC.  CT abdomen and pelvis showing possible hematoma in pelvic muscles, MRI pelvis with and without contrast ordered to further assess.   03/15/20: Seen and examined at bedside.  She wore her BiPAP last night.  She has no new complaints except for pain on the bottom.  Awaiting MRI of pelvis results.  Assessment/Plan: Principal Problem:   Acute hypoxemic respiratory failure due to COVID-19 [facility](HCC) Active Problems:   2019 novel coronavirus disease (COVID-19)   Hypertension    Type 2 diabetes mellitus (HCC)   Dyslipidemia   Depression   Class 1 obesity   Pulmonary emboli (HCC)  Improving severe acute hypoxic hypercarbic respiratory failure, multifactorial secondary to COVID-19 viral pneumonia, suspected superimposed bacterial pulmonary infection, recently diagnosed bilateral pulmonary emboli (02/12/20).  Not on oxygen supplementation at baseline. Continue to maintain O2 saturation greater than 90% Currently on high flow nasal cannula along with nonrebreather to maintain O2 saturation greater than 92% Continue BiPAP at night with breaks during the day to allow for feeding and use of incentive spirometer/flutter valve. Continue to wean off oxygen supplementation as tolerated. Repeat ABG in the morning. Continue bronchodilators, incentive spirometer and flutter valve Continue side sleeping or prone sleeping as tolerated Continue out of bed to chair as tolerated Continue to mobilize as tolerated Continue PT OT with assistance and fall precautions.   Acute blood loss anemia, unclear source. Post 1 unit PRBC transfusion on 03/14/2020 for hemoglobin of 7.2. Hemoglobin uptrending this morning 9.0 from 8.2. CT abdomen and pelvis proximal pelvis no small hematoma. MRI pelvis with and without contrast pending.  Follow results.  Intractable cough in the setting of bilateral PE Started guaifenesin/codeine syrup  COVID-19 viral pneumonia, suspect superimposed by bacterial pulmonary infection, POA, ? aspiration -Tested positive on 02/04/2020.  Off isolation -Completed full course of Remdesivir and baricitinib -Continue to encourage IS, encourage prone positioning, encourage mobilization as tolerated -Restarted on steroids due to continued respiratory failure  -Sputum culture growing abundant Staph aureus, pending susceptibilities. -Continue Unasyn day #3. -Continue Pulmicort, Brovana, albuterol -Inflammatory markers are improving, D-dimer 0.57 from greater than 20, CRP  0.7 from 2.9. When stable will obtain swallow evaluation, continue to closely monitor Continue aspiration precautions  Acute bilateral PE Currently on full dose subcu Lovenox  twice daily. Closely monitor H&H while on anticoagulation.  Resolved constipation Had a bowel movement on 03/13/2020. Continue bowel regimen: Senokot 2 tablet twice daily, MiraLAX daily, Dulcolax suppository as needed  Diabetes mellitus type 2 with hyperglycemia CBG stable and well-controlled. Continue Lantus 40 units twice daily and NovoLog 3 units 3 times daily. Continue SSI scale. Hemoglobin A1c 7.7 on 02/14/2020  Chronic anxiety/depression/insomnia  She was on Remeron PTA, continue as recommended by psychiatry, 7.5 mg nightly. DC Xanax as recommended by psychiatry Avoid using benzodiazepines, wean off as able. Seroquel dose increased to 25 mg twice daily for anxiety and hallucinations. Last QTC 422 on 03/13/2020.  Obesity -Estimated body mass index is 33.15 kg/m as calculated from the following:   Height as of this encounter: 5\' 8"  (1.727 m).   Weight as of this encounter: 98.9 kg. When stable outpatient regular physical activity and healthy dieting.  Abnormal TSH, subclinical hyperthyroidism Normal Free T4, TSH 0.308. -Repeat as outpatient in 4 to 6 weeks when acute illness has resolved  Generalized weakness/ambulatory dysfunction PT assessment recommended home health PT OT. Continue out of bed to chair as tolerated Continue to mobilize as tolerated Continue fall precautions   DVT prophylaxis: Full dose Lovenox subcu twice daily.   Code Status: DNR Family Communicati updated husband via phone on 03/15/2020.    Status is: Inpatient    Dispo:  Patient From: Home  Planned Disposition: Home with Health Care Svc  Expected discharge date: 03/19/2020  Medically stable for discharge: NoNo, ongoing management of COVID-19 viral pneumonia, suspected superimposed bacterial pulmonary infection,  acute hypoxic hypercarbic respiratory failure, and acute blood loss anemia.          Objective: Vitals:   03/15/20 0800 03/15/20 0841 03/15/20 0956 03/15/20 1222  BP:   112/62 108/89  Pulse:   (!) 108 (!) 109  Resp: (!) 22  12 18   Temp:   98.2 F (36.8 C) 98.2 F (36.8 C)  TempSrc:   Oral Oral  SpO2:  96% 99% 95%  Weight:      Height:        Intake/Output Summary (Last 24 hours) at 03/15/2020 1327 Last data filed at 03/15/2020 0600 Gross per 24 hour  Intake 795.94 ml  Output 1000 ml  Net -204.06 ml   Filed Weights   02/04/20 1838  Weight: 98.9 kg    Exam:  . General: 60 y.o. year-old female chronically ill-appearing no acute distress.  Alert and oriented x3. . Cardiovascular: Regular rate and rhythm no rubs murmurs.   02/06/20 Respiratory: Mild rales at bases.  No wheezing noted.  Good inspiratory effort. . Abdomen: Obese nontender normal bowel sounds present. . Musculoskeletal: No extremity edema bilaterally.   60 Psychiatry: Mood is appropriate for condition and setting.  Data Reviewed: CBC: Recent Labs  Lab 03/13/20 0434 03/14/20 0747 03/14/20 1357 03/15/20 0105 03/15/20 0918  WBC 19.6* 15.8*  --  15.7*  --   NEUTROABS  --  12.0*  --  12.0*  --   HGB 7.8* 7.5* 7.2* 8.2* 9.0*  HCT 25.5* 25.3* 23.7* 26.4* 29.1*  MCV 97.3 99.2  --  98.5  --   PLT 331 334  --  285  --    Basic Metabolic Panel: Recent Labs  Lab 03/13/20 0434 03/14/20 0747 03/15/20 0105  NA 137 138 138  K 4.2 4.0 4.4  CL 88* 88* 89*  CO2 41* 38* 39*  GLUCOSE 162* 177* 146*  BUN 11 13 10   CREATININE  0.50 0.41* 0.47  CALCIUM 8.8* 8.9 8.9  MG 2.1  --  2.1  PHOS 3.8  --  4.3   GFR: Estimated Creatinine Clearance: 95.4 mL/min (by C-G formula based on SCr of 0.47 mg/dL). Liver Function Tests: Recent Labs  Lab 03/15/20 0105  AST 32  ALT 67*  ALKPHOS 66  BILITOT 0.7  PROT 5.9*  ALBUMIN 2.8*   No results for input(s): LIPASE, AMYLASE in the last 168 hours. No results for input(s):  AMMONIA in the last 168 hours. Coagulation Profile: No results for input(s): INR, PROTIME in the last 168 hours. Cardiac Enzymes: No results for input(s): CKTOTAL, CKMB, CKMBINDEX, TROPONINI in the last 168 hours. BNP (last 3 results) No results for input(s): PROBNP in the last 8760 hours. HbA1C: No results for input(s): HGBA1C in the last 72 hours. CBG: Recent Labs  Lab 03/14/20 0838 03/14/20 1144 03/14/20 1736 03/14/20 2104 03/15/20 0805  GLUCAP 163* 380* 150* 185* 157*   Lipid Profile: No results for input(s): CHOL, HDL, LDLCALC, TRIG, CHOLHDL, LDLDIRECT in the last 72 hours. Thyroid Function Tests: No results for input(s): TSH, T4TOTAL, FREET4, T3FREE, THYROIDAB in the last 72 hours. Anemia Panel: Recent Labs    03/14/20 0743  FERRITIN 310*  TIBC 310  IRON 75   Urine analysis: No results found for: COLORURINE, APPEARANCEUR, LABSPEC, PHURINE, GLUCOSEU, HGBUR, BILIRUBINUR, KETONESUR, PROTEINUR, UROBILINOGEN, NITRITE, LEUKOCYTESUR Sepsis Labs: @LABRCNTIP (procalcitonin:4,lacticidven:4)  ) Recent Results (from the past 240 hour(s))  Expectorated sputum assessment w rflx to resp cult     Status: None   Collection Time: 03/13/20  4:42 PM   Specimen: Expectorated Sputum  Result Value Ref Range Status   Specimen Description EXPECTORATED SPUTUM  Final   Special Requests NONE  Final   Sputum evaluation   Final    THIS SPECIMEN IS ACCEPTABLE FOR SPUTUM CULTURE Performed at [facility], [address], [place], [place]    Report Status 03/13/2020 FINAL  Final  Culture, respiratory     Status: None (Preliminary result)   Collection Time: 03/13/20  4:42 PM  Result Value Ref Range Status   Specimen Description   Final    EXPECTORATED SPUTUM Performed at [facility], [address] [place], [place]    Special Requests   Final    NONE Reflexed from [id number] Performed at [facility], [address], [place], [place]    Gram Stain   Final    FEW WBC PRESENT, PREDOMINANTLY PMN MODERATE GRAM POSITIVE COCCI IN PAIRS IN CLUSTERS MODERATE GRAM POSITIVE RODS    Culture   Final    ABUNDANT STAPHYLOCOCCUS AUREUS SUSCEPTIBILITIES TO FOLLOW Performed at [facility], [address], [place], [place]    Report Status PENDING  Incomplete      Studies: CT ABDOMEN PELVIS WO CONTRAST  Result Date: 03/14/2020 CLINICAL DATA:  Respiratory failure. EXAM: CT CHEST, ABDOMEN AND PELVIS WITHOUT CONTRAST TECHNIQUE: Multidetector CT imaging of the chest, abdomen and pelvis was performed following the standard protocol without IV contrast. COMPARISON:  02/12/2020 FINDINGS: CT CHEST FINDINGS Cardiovascular: No significant vascular findings. Mildly enlarged heart size. No pericardial effusion. Mediastinum/Nodes: No enlarged mediastinal, hilar, or axillary lymph nodes. Thyroid gland, trachea, and esophagus demonstrate no significant findings. Lungs/Pleura: Patchy areas of peripheral airspace disease throughout the lungs. Mild diffuse ground-glass opacities throughout the lungs bilaterally. Interstitial thickening along the periphery of bilateral lungs most severe in the left upper and  lower lobes likely reflecting an element of scarring. No pleural effusion or pneumothorax. Musculoskeletal: No acute osseous abnormality. CT ABDOMEN PELVIS FINDINGS Hepatobiliary: No focal liver abnormality is seen. No gallstones, gallbladder wall thickening, or biliary dilatation. Pancreas: Unremarkable. No pancreatic ductal dilatation or surrounding inflammatory changes. Spleen: Normal in size without focal abnormality. Adrenals/Urinary Tract: Normal adrenal glands. 18 mm hypodense, fluid attenuating right interpolar renal mass consistent with a cyst. No urolithiasis or obstructive uropathy. Normal bladder. Stomach/Bowel: Stomach is within normal limits. Appendix appears normal. No evidence of bowel wall  thickening, distention, or inflammatory changes. Large amount of stool throughout the colon with rectal fecal impaction. Vascular/Lymphatic: Normal caliber abdominal aorta with mild atherosclerosis. No lymphadenopathy. Reproductive: Bilateral adnexa are unremarkable. Small dystrophic calcifications in the posterior aspect of the uterus. Other: No abdominopelvic ascites.  Fat containing umbilical hernia. Musculoskeletal: No acute or significant osseous findings. Moderate osteoarthritis of bilateral SI joints. Expansion of the left obturator internus muscle which may reflect an intramuscular hematoma or infection. Mild expansion of the right operator internus muscle with a 17 mm low-attenuation focus within the muscle which may reflect a liquified intramuscular hematoma or abscess. Severe bilateral facet arthropathy at L3-4. IMPRESSION: 1. Patchy areas of peripheral airspace disease throughout the lungs bilaterally with mild diffuse ground-glass opacities throughout the lungs bilaterally. Differential considerations include persistent multilobar pneumonia including atypical viral pneumonia such as COVID-19 versus alveolitis. Interstitial thickening along the periphery of bilateral lungs most severe in the left upper and lower lobes likely reflecting an element of scarring. 2. Expansion of the left obturator internus muscle which may reflect an intramuscular hematoma or infection. Mild expansion of the right operator internus muscle with a 17 mm low-attenuation focus within the muscle which may reflect a liquified intramuscular hematoma or abscess. Recommend further evaluation with an MRI of the pelvis without and with intravenous contrast. 3. Large amount of stool throughout the colon with rectal fecal impaction. 4. Aortic Atherosclerosis (ICD10-I70.0). Electronically Signed   By: Elige Ko   On: 03/14/2020 18:31   CT CHEST WO CONTRAST  Result Date: 03/14/2020 CLINICAL DATA:  Respiratory failure. EXAM: CT CHEST,  ABDOMEN AND PELVIS WITHOUT CONTRAST TECHNIQUE: Multidetector CT imaging of the chest, abdomen and pelvis was performed following the standard protocol without IV contrast. COMPARISON:  02/12/2020 FINDINGS: CT CHEST FINDINGS Cardiovascular: No significant vascular findings. Mildly enlarged heart size. No pericardial effusion. Mediastinum/Nodes: No enlarged mediastinal, hilar, or axillary lymph nodes. Thyroid gland, trachea, and esophagus demonstrate no significant findings. Lungs/Pleura: Patchy areas of peripheral airspace disease throughout the lungs. Mild diffuse ground-glass opacities throughout the lungs bilaterally. Interstitial thickening along the periphery of bilateral lungs most severe in the left upper and lower lobes likely reflecting an element of scarring. No pleural effusion or pneumothorax. Musculoskeletal: No acute osseous abnormality. CT ABDOMEN PELVIS FINDINGS Hepatobiliary: No focal liver abnormality is seen. No gallstones, gallbladder wall thickening, or biliary dilatation. Pancreas: Unremarkable. No pancreatic ductal dilatation or surrounding inflammatory changes. Spleen: Normal in size without focal abnormality. Adrenals/Urinary Tract: Normal adrenal glands. 18 mm hypodense, fluid attenuating right interpolar renal mass consistent with a cyst. No urolithiasis or obstructive uropathy. Normal bladder. Stomach/Bowel: Stomach is within normal limits. Appendix appears normal. No evidence of bowel wall thickening, distention, or inflammatory changes. Large amount of stool throughout the colon with rectal fecal impaction. Vascular/Lymphatic: Normal caliber abdominal aorta with mild atherosclerosis. No lymphadenopathy. Reproductive: Bilateral adnexa are unremarkable. Small dystrophic calcifications in the posterior aspect of the uterus. Other: No abdominopelvic ascites.  Fat containing umbilical hernia. Musculoskeletal: No acute or significant osseous findings. Moderate osteoarthritis of bilateral SI  joints. Expansion of the left obturator internus muscle which may reflect an intramuscular hematoma or infection. Mild expansion of the right operator internus muscle with a 17 mm low-attenuation focus within the muscle which may reflect a liquified intramuscular hematoma or abscess. Severe bilateral facet arthropathy at L3-4. IMPRESSION: 1. Patchy areas of peripheral airspace disease throughout the lungs bilaterally with mild diffuse ground-glass opacities throughout the lungs bilaterally. Differential considerations include persistent multilobar pneumonia including atypical viral pneumonia such as COVID-19 versus alveolitis. Interstitial thickening along the periphery of bilateral lungs most severe in the left upper and lower lobes likely reflecting an element of scarring. 2. Expansion of the left obturator internus muscle which may reflect an intramuscular hematoma or infection. Mild expansion of the right operator internus muscle with a 17 mm low-attenuation focus within the muscle which may reflect a liquified intramuscular hematoma or abscess. Recommend further evaluation with an MRI of the pelvis without and with intravenous contrast. 3. Large amount of stool throughout the colon with rectal fecal impaction. 4. Aortic Atherosclerosis (ICD10-I70.0). Electronically Signed   By: Elige Ko   On: 03/14/2020 18:31    Scheduled Meds: . sodium chloride   Intravenous Once  . arformoterol  15 mcg Nebulization BID  . vitamin C  500 mg Oral Daily  . benzonatate  200 mg Oral BID  . bisacodyl  10 mg Rectal Once  . budesonide  0.5 mg Nebulization BID  . chlorhexidine  15 mL Mouth Rinse BID  . Chlorhexidine Gluconate Cloth  6 each Topical Daily  . cholecalciferol  1,000 Units Oral Daily  . enoxaparin (LOVENOX) injection  100 mg Subcutaneous BID  . feeding supplement  237 mL Oral BID BM  . insulin aspart  0-20 Units Subcutaneous TID WC  . insulin aspart  0-5 Units Subcutaneous QHS  . insulin aspart  3  Units Subcutaneous TID WC  . insulin glargine  4 Units Subcutaneous BID  . mouth rinse  15 mL Mouth Rinse q12n4p  . mirtazapine  7.5 mg Oral QHS  . multivitamin with minerals  1 tablet Oral Daily  . polyethylene glycol  17 g Oral Daily  . predniSONE  30 mg Oral Q breakfast  . QUEtiapine  12.5 mg Oral BID  . senna-docusate  2 tablet Oral BID  . zinc sulfate  220 mg Oral Daily    Continuous Infusions: . ampicillin-sulbactam (UNASYN) IV 3 g (03/15/20 0922)     LOS: 39 days     Darlin Drop, MD [facility] Pager [phone]  If 7PM-7AM, please contact night-coverage [email or web address] Password [email or web address] 03/15/2020, 1:27 PM

## 2020-03-15 NOTE — Progress Notes (Signed)
Pt resp status has improved, currently remains on 7 liters sating at 96-98 %. Pt continues to use Flutter and IS. SRP, RN

## 2020-03-15 NOTE — Progress Notes (Signed)
Handoff called to [place], Charity fundraiser in CCU/SD.

## 2020-03-15 NOTE — Progress Notes (Signed)
Pt currently on 7 liters O2 HFNC, able to work with physical therapy this am, sat decreased to 82- 85% on 10 liters. Tolerated well. Pt transported to MRI with O2 at 10 liters and tolerated well. Pt return on 7 liters, will continue to monitor. SRP, RN

## 2020-03-15 NOTE — Progress Notes (Signed)
Patient ID: Christina Vincent, female   DOB: 09-16-1960, 60 y.o.   MRN: [id number]   Asked to review patient's medical situation and imaging with providing hospitalist, Dr. Margo Aye.  Patient admitted with COVID pneumonia, found to have very small volume pulmonary embolism on chest CTA performed 02/12/2020 (by my review, small volume pulmonary embolism was also seen on preceding CTA performed 12/30/202), though predominant pulmonary abnormality is extensive Covid related multifocal pneumonia.  Overall pulmonary embolism clot burden is very small without definitive evidence of right-sided heart strain.  Patient was then started on anticoagulation however CT scan of the chest, abdomen and pelvis performed 03/14/2020 demonstrated development of bilateral obturator hematomas further evaluated on pelvic MRI performed 03/15/2020.  Bilateral lower extremity venous ultrasounds were performed 02/08/2020 and 02/12/2020 and were both negative for lower extremity DVT.  Clinically, the patient's respiratory status has improved with associated improved aeration of the lungs bilaterally, right greater than left when comparing chest radiograph performed 03/13/2020 to the chest radiograph performed 02/21/2020.  As the patient's respiratory status is improved and the patient has experienced a complication associated with her anticoagulation for very small burden pulmonary embolism, my recommendation is to cease anticoagulation at this time.  Additionally, as patient's lower extremity venous Doppler ultrasounds have been negative for DVT, here is no role for IVC filter placement at this time.  Note, besides ceasing anticoagulation, there is no role for percutaneous intervention of the patient's bilateral obturator hematomas.  Additionally, would only recommend repeat chest CTA if patient's clinical status were to deteriorate.  Above discussed a length with Dr. Margo Aye.   Katherina Right, MD Pager #: [phone]

## 2020-03-15 NOTE — Progress Notes (Signed)
NAME:  Christina Vincent, MRN:  [id number], DOB:  1960/05/02, LOS: 64 ADMISSION DATE:  02/04/2020, CONSULTATION DATE:  1/20 REFERRING MD:  Bonner Puna, CHIEF COMPLAINT:  Refractory hypoxia in setting of COVID   Brief History:  This is a 60 year old female patient who was initially admitted on 12/27 with chief complaint of fever and shortness of breath, found to have positive COVID PCR, with mild hypoxia.  Initial chest x-ray was essentially clear, CRP was 16.6, she was treated with full scope of therapy including systemic steroids for hypoxia, remdesivir, 14 days of baricitinib, and supplemental oxygen. Her course has been since complicated by progressive and now persistent respiratory failure requiring ongoing heated high flow oxygen, acute bilateral pulmonary emboli 02/12/20 without evidence of right heart strain. In spite of all aggressive therapies she continues to require high supplemental oxygen dosing, patient's been fairly anxious in regards to her lack of progress.  Pulmonary asked to evaluate to determine if there were any other potential therapies and to discuss with patient concern about possible fibrotic related injuries at this point.  Past Medical History:  Type 2 diabetes, hypertension, obesity, hyperlipidemia, depression  Significant Hospital Events:  12/28-->1/20. admitted on 12/27 with chief complaint of fever and shortness of breath, found to have positive COVID PCR, with mild hypoxia.  Initial chest x-ray was essentially clear, CRP was 16.6, she was treated with full scope of therapy including systemic steroids for hypoxia, remdesivir, 14 days of baricitinib, and supplemental oxygen. Her course has been since complicated by progressive and now persistent respiratory failure requiring ongoing heated high flow oxygen,  03/02/20 -: Confused today, ABG with hypercapnia. Called to reassess. Patient endorses SOB. Feels better on bipap.  1/25 -  She looks worse specifically when comparing her  to when I met her on 1/21.  Continues to have exertional dyspnea, only speaking in short phrases.  May have had some improvement with nasogastric tube - > moved to dnr/dni with bipap and concurrent palliation. Esclaate pallation if she declines   03/14/2020: CT pelvis concern for bleeding within the obturator muscles, confirmed by MRI on 03/15/2020, PCCM reconsulted  Consults:  pulm 1/20 Psych 1/19 Procedures:   Significant Diagnostic Tests:  CT angiogram 1/4: Acute bilateral lower lobe and right middle lobe segmental pulmonary emboli. No evidence of right heart strain. Multifocal pneumonia with worsening of aeration since 02/07/2020.  Micro Data:  covid + 02/04/20  Antimicrobials:   Interim History / Subjective:   Remains on heated high flow.  Was transiently hypoxemic.  Less tachypneic now.  Remains on O2 supplementation at 7 L.  The been able to slowly titrate down.  We were consulted earlier in evening with hypoxemia which is somewhat improved.   Objective   Blood pressure 113/66, pulse 99, temperature 98 F (36.7 C), temperature source Oral, resp. rate 20, height 5' 8"  (1.727 m), weight 98.9 kg, SpO2 100 %.        Intake/Output Summary (Last 24 hours) at 03/15/2020 1847 Last data filed at 03/15/2020 0600 Gross per 24 hour  Intake 795.94 ml  Output 1000 ml  Net -204.06 ml   Filed Weights   02/04/20 1838  Weight: 98.9 kg    Examination: General appearance: 60 y.o., female, NAD, conversant, however labored breathing Eyes: anicteric sclerae, moist conjunctivae; no lid-lag; PERRLA, tracking appropriately HENT: NCAT; oropharynx, MMM, no mucosal ulcerations; normal hard and soft palate Neck: Trachea midline; FROM, supple, lymphadenopathy, no JVD Lungs: Bilateral crackles, no wheeze CV: Tachycardic, regular, S1, S2  Abdomen: Soft, non-tender; non-distended, BS present  Extremities: No peripheral edema, radial and DP pulses present bilaterally  Psych: Appropriate affect Neuro:  Alert and oriented to person and place, no focal deficit    Resolved Hospital Problem list    Assessment & Plan:   Acute blood loss anemia Spontaneous bleeding into the pelvis. Large intramuscular hematomas into the bilateral obturator muscles. Plan: Agree with stopping anticoagulation Discussing case with interventional radiology.  Not sure if there is any role for intervention. However I think close observation to see if this tamponade's as long as she is hemodynamically stable is most appropriate. Continue to follow H&H. Low threshold to transfer to the intensive care unit for product resuscitation if needed Would hold off on transfer to the ICU at this time.  Acute hypoxic and hypercapnic respiratory failure due to covid 19 viral pneumonia. 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 Had transient desaturations but this is better now. I spoke with the patient's nurse today at bedside. Patient had tachypnea which was presumed related to her new anemia. Able to do anything for the pulmonary embolism at this time Continue to hold anticoagulation due to bleeding  Acute metabolic encephalopathy due to hypercapnia -Resolved at this time Continue to observe -Compensated chronic hypercapnic failure, normal pH with an elevated PCO2   We appreciate consultation.  Pulmonary critical care will sign off at this time.  Please call if any questions.  LABS    PULMONARY Recent Labs  Lab 03/13/20 0843  PHART 7.418  PCO2ART 67.1*  PO2ART 60.8*  HCO3 42.5*  O2SAT 90.0    CBC Recent Labs  Lab 03/13/20 0434 03/14/20 0747 03/14/20 1357 03/15/20 0105 03/15/20 0918  HGB 7.8* 7.5* 7.2* 8.2* 9.0*  HCT 25.5* 25.3* 23.7* 26.4* 29.1*  WBC 19.6* 15.8*  --  15.7*  --   PLT 331 334  --  285  --     COAGULATION No results for  input(s): INR in the last 168 hours.  CARDIAC  No results for input(s): TROPONINI in the last 168 hours. No results for input(s): PROBNP in the last 168 hours.   CHEMISTRY Recent Labs  Lab 03/13/20 0434 03/14/20 0747 03/15/20 0105  NA 137 138 138  K 4.2 4.0 4.4  CL 88* 88* 89*  CO2 41* 38* 39*  GLUCOSE 162* 177* 146*  BUN 11 13 10   CREATININE 0.50 0.41* 0.47  CALCIUM 8.8* 8.9 8.9  MG 2.1  --  2.1  PHOS 3.8  --  4.3   Estimated Creatinine Clearance: 95.4 mL/min (by C-G formula based on SCr of 0.47 mg/dL).   LIVER Recent Labs  Lab 03/15/20 0105  AST 32  ALT 67*  ALKPHOS 66  BILITOT 0.7  PROT 5.9*  ALBUMIN 2.8*     INFECTIOUS Recent Labs  Lab 03/13/20 0434  PROCALCITON 0.12     ENDOCRINE CBG (last 3)  Recent Labs    03/14/20 1736 03/14/20 2104 03/15/20 0805  GLUCAP 150* 185* 157*      IMAGING x48h  - image(s) personally visualized  -   highlighted in bold CT ABDOMEN PELVIS WO CONTRAST  Result Date: 03/14/2020 CLINICAL DATA:  Respiratory failure. EXAM: CT CHEST, ABDOMEN AND PELVIS WITHOUT CONTRAST TECHNIQUE: Multidetector CT imaging of the chest, abdomen and pelvis was performed following the standard protocol without IV contrast. COMPARISON:  02/12/2020 FINDINGS: CT CHEST FINDINGS Cardiovascular: No significant vascular findings. Mildly enlarged heart size. No pericardial effusion. Mediastinum/Nodes: No enlarged mediastinal, hilar, or axillary lymph nodes. Thyroid gland, trachea, and esophagus demonstrate no significant findings. Lungs/Pleura: Patchy areas of peripheral airspace disease throughout the lungs. Mild diffuse ground-glass opacities throughout the lungs bilaterally. Interstitial thickening along the periphery of bilateral lungs most severe in the left upper and lower lobes likely reflecting an element of scarring. No pleural effusion or pneumothorax. Musculoskeletal: No acute osseous abnormality. CT ABDOMEN PELVIS FINDINGS Hepatobiliary: No focal  liver abnormality is seen. No gallstones, gallbladder wall thickening, or biliary dilatation. Pancreas: Unremarkable. No pancreatic ductal dilatation or surrounding inflammatory changes. Spleen: Normal in size without focal abnormality. Adrenals/Urinary Tract: Normal adrenal glands. 18 mm hypodense, fluid attenuating right interpolar renal mass consistent with a cyst. No urolithiasis or obstructive uropathy. Normal bladder. Stomach/Bowel: Stomach is within normal limits. Appendix appears normal. No evidence of bowel wall thickening, distention, or inflammatory changes. Large amount of stool throughout the colon with rectal fecal impaction. Vascular/Lymphatic: Normal caliber abdominal aorta with mild atherosclerosis. No lymphadenopathy. Reproductive: Bilateral adnexa are unremarkable. Small dystrophic calcifications in the posterior aspect of the uterus. Other: No abdominopelvic ascites.  Fat containing umbilical hernia. Musculoskeletal: No acute or significant osseous findings. Moderate osteoarthritis of bilateral SI joints. Expansion of the left obturator internus muscle which may reflect an intramuscular hematoma or infection. Mild expansion of the right operator internus muscle with a 17 mm low-attenuation focus within the muscle which may reflect a liquified intramuscular hematoma or abscess. Severe bilateral facet arthropathy at L3-4. IMPRESSION: 1. Patchy areas of peripheral airspace disease throughout the lungs bilaterally with mild diffuse ground-glass opacities throughout the lungs bilaterally. Differential considerations include persistent multilobar pneumonia including atypical viral pneumonia such as COVID-19 versus alveolitis. Interstitial thickening along the periphery of bilateral lungs most severe in the left upper and lower lobes likely reflecting an element of scarring. 2. Expansion of the left obturator internus muscle which may reflect an intramuscular hematoma or infection. Mild expansion of the  right operator internus muscle with a 17 mm low-attenuation focus within the muscle which may reflect a liquified intramuscular hematoma or abscess. Recommend further evaluation with an MRI of the pelvis without and with intravenous contrast. 3. Large amount of stool throughout the colon with rectal fecal impaction. 4. Aortic Atherosclerosis (ICD10-I70.0). Electronically Signed   By: Kathreen Devoid   On: 03/14/2020 18:31   CT CHEST WO CONTRAST  Result Date: 03/14/2020 CLINICAL DATA:  Respiratory failure. EXAM: CT CHEST, ABDOMEN AND PELVIS WITHOUT CONTRAST TECHNIQUE: Multidetector CT imaging of the chest, abdomen and pelvis was performed following the standard protocol without IV contrast. COMPARISON:  02/12/2020 FINDINGS: CT CHEST FINDINGS Cardiovascular: No significant vascular findings. Mildly enlarged heart size. No pericardial effusion. Mediastinum/Nodes: No enlarged mediastinal, hilar, or axillary lymph nodes. Thyroid gland, trachea, and esophagus demonstrate no significant findings. Lungs/Pleura: Patchy areas of peripheral airspace disease throughout the lungs. Mild diffuse ground-glass opacities throughout the lungs bilaterally. Interstitial thickening along the periphery of bilateral lungs most severe in the left upper and lower lobes likely reflecting an element of scarring. No pleural effusion or pneumothorax. Musculoskeletal: No acute osseous abnormality. CT ABDOMEN PELVIS FINDINGS Hepatobiliary: No focal liver abnormality is seen. No gallstones, gallbladder wall thickening, or biliary dilatation. Pancreas: Unremarkable. No pancreatic ductal dilatation or surrounding inflammatory changes. Spleen: Normal in size without focal abnormality. Adrenals/Urinary Tract: Normal adrenal glands. 18 mm hypodense, fluid attenuating right interpolar renal mass consistent with a  cyst. No urolithiasis or obstructive uropathy. Normal bladder. Stomach/Bowel: Stomach is within normal limits. Appendix appears normal. No  evidence of bowel wall thickening, distention, or inflammatory changes. Large amount of stool throughout the colon with rectal fecal impaction. Vascular/Lymphatic: Normal caliber abdominal aorta with mild atherosclerosis. No lymphadenopathy. Reproductive: Bilateral adnexa are unremarkable. Small dystrophic calcifications in the posterior aspect of the uterus. Other: No abdominopelvic ascites.  Fat containing umbilical hernia. Musculoskeletal: No acute or significant osseous findings. Moderate osteoarthritis of bilateral SI joints. Expansion of the left obturator internus muscle which may reflect an intramuscular hematoma or infection. Mild expansion of the right operator internus muscle with a 17 mm low-attenuation focus within the muscle which may reflect a liquified intramuscular hematoma or abscess. Severe bilateral facet arthropathy at L3-4. IMPRESSION: 1. Patchy areas of peripheral airspace disease throughout the lungs bilaterally with mild diffuse ground-glass opacities throughout the lungs bilaterally. Differential considerations include persistent multilobar pneumonia including atypical viral pneumonia such as COVID-19 versus alveolitis. Interstitial thickening along the periphery of bilateral lungs most severe in the left upper and lower lobes likely reflecting an element of scarring. 2. Expansion of the left obturator internus muscle which may reflect an intramuscular hematoma or infection. Mild expansion of the right operator internus muscle with a 17 mm low-attenuation focus within the muscle which may reflect a liquified intramuscular hematoma or abscess. Recommend further evaluation with an MRI of the pelvis without and with intravenous contrast. 3. Large amount of stool throughout the colon with rectal fecal impaction. 4. Aortic Atherosclerosis (ICD10-I70.0). Electronically Signed   By: Kathreen Devoid   On: 03/14/2020 18:31   MR PELVIS W WO CONTRAST  Result Date: 03/15/2020 CLINICAL DATA:   60 year old female with expansion of operator musculatures seen on previous imaging. EXAM: MRI PELVIS WITHOUT AND WITH CONTRAST TECHNIQUE: Multiplanar multisequence MR imaging of the pelvis was performed both before and after administration of intravenous contrast. CONTRAST:  37m GADAVIST GADOBUTROL 1 MMOL/ML IV SOLN COMPARISON:  CT chest, abdomen and pelvis dated March 14, 2020 FINDINGS: Urinary Tract: No distal ureteral dilation. Urinary bladder with smooth contour. Bowel: Abundant stool in the rectum. Signs of rectal distension without perirectal stranding. Visualized bowel loops proximal to this without signs of dilation. Normal appendix in the RIGHT lower quadrant. Vascular/Lymphatic: Patent pelvic vasculature.  No adenopathy. Reproductive: Uterus with leiomyomas. Normal appearance of adnexal structures. Largest leiomyoma in the uterine fundus on the RIGHT measures 2 cm. Other: Edema in the musculature of the abductor compartment associated with hematomas in the bilateral obturator musculature, see below. Musculoskeletal: Moderately large hematomas in the bilateral obturator musculature, mixed signal on T2 and on T1. The LEFT side measuring 7.6 x 3.0 cm. The RIGHT-side measuring 6.2 x 3.0 cm. No sign of internal enhancement. Little or no peripheral enhancement and no signs of gas on prior CT. IMPRESSION: 1. Moderately large intramuscular hematomas in the bilateral obturator musculature with no sign of internal enhancement and only minimal peripheral enhancement at the interface with adjacent musculature. No specific findings to indicate infection. Correlate with any clinical signs of infection and attention on follow-up. 2. Bilateral abductor musculature with edema versus mild myositis. Favor edema given findings on current scan. 3. Abundant stool in the rectum. Due to the volume of stool fecal impaction is considered. These results will be called to the ordering clinician or representative by the Radiologist  Assistant, and communication documented in the PACS or [employer] Electronically Signed   By: GZetta BillsM.D.   On:  03/15/2020 14:50    This patient is critically ill with multiple organ system failure; which, requires frequent high complexity decision making, assessment, support, evaluation, and titration of therapies. This was completed through the application of advanced monitoring technologies and extensive interpretation of multiple databases. During this encounter critical care time was devoted to patient care services described in this note for 32 minutes.  [facility] 03/15/2020 6:47 PM

## 2020-03-15 NOTE — Plan of Care (Signed)
  Problem: Health Behavior/Discharge Planning: Goal: Ability to manage health-related needs will improve Outcome: Progressing   Problem: Clinical Measurements: Goal: Ability to maintain clinical measurements within normal limits will improve Outcome: Progressing Goal: Will remain free from infection Outcome: Progressing   

## 2020-03-15 NOTE — Progress Notes (Signed)
Physical Therapy Treatment Patient Details Name: Christina Vincent MRN: [id number] DOB: 03-30-1960 Today's Date: 03/15/2020    History of Present Illness Pt is 60 y.o. female with medical history significant of depression, dyslipidemia, type 2 diabetes, class I obesity, hypertension who is coming to the emergency department due to progressively worse shortness of breath for the past 6 days associated with fever, chills, fatigue, malaise, sore throat, occasionally productive of whitish/clear dry cough and rhinorrhea.  Patient is unvaccinated and has been admitted for acute hypoxemic respiratory failure secondary to COVID-19.  Pt also found to have PE 02/12/20. Patient became lethargic 1/23 and transferred to ICU - almost intubated but family opted for Do Not Intubate and bipap was used instead. Patient currently in progressive care on 8 L at rest.    PT Comments    Pt cooperative but continues to be ltd by SOB and elevated anxiety level.  This date pt to EOB sitting and stand pvt to [facility] - desat to 92% on 13L.  Pt stood and ambulated short distance with RW to top of bed but limited by pain (that sore spot on my bottom) and anxiety with increasing SOB.  Pt returned to bed 2* transport ready for MRI.    Follow Up Recommendations  Home health PT;Supervision/Assistance - 24 hour     Equipment Recommendations  Other (comment)    Recommendations for Other Services       Precautions / Restrictions Precautions Precautions: Fall Precaution Comments: monitor sats, anxious and emotional with activity, Increase to 15 L HFNC for activity Restrictions Weight Bearing Restrictions: No    Mobility  Bed Mobility Overal bed mobility: Needs Assistance Bed Mobility: Sit to Supine;Rolling Rolling: Supervision   Supine to sit: Min assist Sit to supine: Supervision   General bed mobility comments: Increased time  Transfers Overall transfer level: Needs assistance Equipment used: Rolling walker (2  wheeled) Transfers: Sit to/from [employer] Sit to Stand: Min assist Stand pivot transfers: Min assist       General transfer comment: needs cues for hand placement and min A to power up to standing  Ambulation/Gait Ambulation/Gait assistance: Min assist Gait Distance (Feet): 4 Feet Assistive device: Rolling walker (2 wheeled) Gait Pattern/deviations: Step-to pattern Gait velocity: decreased   General Gait Details: Stand pvt bed to [facility] with HHA and ambulated short distance from [facility] to head of bed   Stairs             Wheelchair Mobility    Modified Rankin (Stroke Patients Only)       Balance Overall balance assessment: Needs assistance Sitting-balance support: Feet supported Sitting balance-Leahy Scale: Fair     Standing balance support: Bilateral upper extremity supported Standing balance-Leahy Scale: Poor                              Cognition Arousal/Alertness: Awake/alert Behavior During Therapy: Anxious Overall Cognitive Status: Impaired/Different from baseline                         Following Commands: Follows one step commands inconsistently (dependent on level of anxiety)     Problem Solving: Slow processing General Comments: needing increased cues due to anxiety, pt is aware this limits her "I don't like being this way"      Exercises      General Comments General comments (skin integrity, edema, etc.): Pt on 13 L HFNC  Pertinent Vitals/Pain Pain Assessment: Faces Faces Pain Scale: Hurts even more Pain Location: That sore place on my bottom - pain increased with movement and with time in standing Pain Descriptors / Indicators: Sore Pain Intervention(s): Limited activity within patient's tolerance;Monitored during session    Home Living                      Prior Function            PT Goals (current goals can now be found in the care plan section) Acute Rehab PT Goals Patient  Stated Goal: to go home PT Goal Formulation: With patient Time For Goal Achievement: 03/23/20 Potential to Achieve Goals: Fair Progress towards PT goals: Progressing toward goals    Frequency    Min 3X/week      PT Plan Current plan remains appropriate    Co-evaluation              AM-PAC PT "6 Clicks" Mobility   Outcome Measure  Help needed turning from your back to your side while in a flat bed without using bedrails?: A Little Help needed moving from lying on your back to sitting on the side of a flat bed without using bedrails?: A Little Help needed moving to and from a bed to a chair (including a wheelchair)?: A Little Help needed standing up from a chair using your arms (e.g., wheelchair or bedside chair)?: A Little Help needed to walk in hospital room?: A Lot Help needed climbing 3-5 steps with a railing? : Total 6 Click Score: 15    End of Session Equipment Utilized During Treatment: Oxygen Activity Tolerance: Patient limited by fatigue;Other (comment) (anxiety) Patient left: in bed;with call bell/phone within reach;with bed alarm set;with family/visitor present Nurse Communication: Mobility status PT Visit Diagnosis: Other abnormalities of gait and mobility (R26.89)     Time: 1027-1100 PT Time Calculation (min) (ACUTE ONLY): 33 min  Charges:  $Gait Training: 8-22 mins $Therapeutic Activity: 8-22 mins                     [name] PT Acute Rehabilitation Services Pager [phone] Office [phone]    Ahlijah Raia 03/15/2020, 1:36 PM

## 2020-03-16 LAB — CULTURE, RESPIRATORY W GRAM STAIN

## 2020-03-16 LAB — CBC WITH DIFFERENTIAL/PLATELET
Abs Immature Granulocytes: 0.23 10*3/uL — ABNORMAL HIGH (ref 0.00–0.07)
Basophils Absolute: 0 10*3/uL (ref 0.0–0.1)
Basophils Relative: 0 %
Eosinophils Absolute: 0 10*3/uL (ref 0.0–0.5)
Eosinophils Relative: 0 %
HCT: 28.7 % — ABNORMAL LOW (ref 36.0–46.0)
Hemoglobin: 8.5 g/dL — ABNORMAL LOW (ref 12.0–15.0)
Immature Granulocytes: 2 %
Lymphocytes Relative: 13 %
Lymphs Abs: 1.7 10*3/uL (ref 0.7–4.0)
MCH: 29.7 pg (ref 26.0–34.0)
MCHC: 29.6 g/dL — ABNORMAL LOW (ref 30.0–36.0)
MCV: 100.3 fL — ABNORMAL HIGH (ref 80.0–100.0)
Monocytes Absolute: 0.8 10*3/uL (ref 0.1–1.0)
Monocytes Relative: 7 %
Neutro Abs: 9.8 10*3/uL — ABNORMAL HIGH (ref 1.7–7.7)
Neutrophils Relative %: 78 %
Platelets: 331 10*3/uL (ref 150–400)
RBC: 2.86 MIL/uL — ABNORMAL LOW (ref 3.87–5.11)
RDW: 18.5 % — ABNORMAL HIGH (ref 11.5–15.5)
WBC: 12.5 10*3/uL — ABNORMAL HIGH (ref 4.0–10.5)
nRBC: 1.4 % — ABNORMAL HIGH (ref 0.0–0.2)

## 2020-03-16 LAB — BLOOD GAS, ARTERIAL
Acid-Base Excess: 15.1 mmol/L — ABNORMAL HIGH (ref 0.0–2.0)
Bicarbonate: 42.2 mmol/L — ABNORMAL HIGH (ref 20.0–28.0)
Drawn by: 308601
O2 Saturation: 89.4 %
Patient temperature: 98.6
pCO2 arterial: 65.3 mmHg (ref 32.0–48.0)
pH, Arterial: 7.427 (ref 7.350–7.450)
pO2, Arterial: 56.8 mmHg — ABNORMAL LOW (ref 83.0–108.0)

## 2020-03-16 LAB — GLUCOSE, CAPILLARY
Glucose-Capillary: 139 mg/dL — ABNORMAL HIGH (ref 70–99)
Glucose-Capillary: 134 mg/dL — ABNORMAL HIGH (ref 70–99)

## 2020-03-16 LAB — MRSA PCR SCREENING: MRSA by PCR: POSITIVE — AB

## 2020-03-16 MED ORDER — SODIUM CHLORIDE 0.9% FLUSH: 10.0000 mL | INTRAVENOUS | Status: DC | PRN

## 2020-03-16 MED ORDER — AMOXICILLIN-POT CLAVULANATE ER 1000-62.5 MG PO TB12
2.0000 | ORAL_TABLET | Freq: Once | ORAL | Status: DC
  Administered 2020-03-16: 2 via ORAL
  Filled 2020-03-16: qty 2

## 2020-03-16 MED ORDER — FUROSEMIDE 10 MG/ML IJ SOLN: 20.0000 mg | Freq: Once | INTRAMUSCULAR | Status: DC

## 2020-03-16 MED ORDER — MUPIROCIN 2 % EX OINT
1.0000 "application " | TOPICAL_OINTMENT | Freq: Two times a day (BID) | CUTANEOUS | Status: AC
  Administered 2020-03-16 – 2020-03-21 (×10): 1 via NASAL
  Filled 2020-03-16: qty 22

## 2020-03-16 MED ORDER — FUROSEMIDE 40 MG PO TABS
40.0000 mg | ORAL_TABLET | Freq: Once | ORAL | Status: AC
  Administered 2020-03-16: 40 mg via ORAL
  Filled 2020-03-16: qty 1

## 2020-03-16 MED ORDER — LINEZOLID 600 MG PO TABS
600.0000 mg | ORAL_TABLET | Freq: Two times a day (BID) | ORAL | Status: DC
  Administered 2020-03-16: 600 mg via ORAL
  Filled 2020-03-16 (×2): qty 1

## 2020-03-16 MED ORDER — SODIUM CHLORIDE 0.9% FLUSH
10.0000 mL | Freq: Two times a day (BID) | INTRAVENOUS | Status: DC
  Administered 2020-03-16 – 2020-03-21 (×8): 10 mL

## 2020-03-16 NOTE — Progress Notes (Signed)
Anesthesia are in OR cases, unable to restart IV, will come when time allows, in mean while, MD updated and adjusted meds to accommodate for loss IV access. SRP,RN

## 2020-03-16 NOTE — Progress Notes (Signed)
Pt tol bipap well with Ativan, she was able to semi prone for over 7 hours, report given to Sophia RN who will resume care.

## 2020-03-16 NOTE — Plan of Care (Signed)
  Problem: Health Behavior/Discharge Planning: Goal: Ability to manage health-related needs will improve Outcome: Progressing   Problem: Clinical Measurements: Goal: Ability to maintain clinical measurements within normal limits will improve Outcome: Progressing Goal: Will remain free from infection Outcome: Progressing Goal: Diagnostic test results will improve Outcome: Progressing   

## 2020-03-16 NOTE — Progress Notes (Signed)
PT IS VERY ALERT AND TALKING WITHOUT SOB AND LUNG ARE CLEAR. ABG WAS ON 12L AND THE PT IS A SEVERE RETAINER. PT IS ON 5L AND HER SATS ARE 100%. iF ANY STAFF RAISES THE PT'S O2 FOR HER TO MOVE, THEY NEE TO REMEMBER TO LOWER HER O2 BACK DOWN. RT WILL CONTINUE TO MONITOR

## 2020-03-16 NOTE — Progress Notes (Signed)
PROGRESS NOTE  Christina Vincent XNA:[id number] DOB: 1960-08-14 DOA: 02/04/2020 PCP: Avon Gully, MD  HPI/Recap of past 24 hours: Christina D Siddleis a 60 y.o.femalewith a history of T2DM, HTN, HLD, obesity, chronic depression/anxiety, insomnia who presented with fever and dyspnea found to have positive SARS-CoV-2 PCR and mild hypoxia despite clear CXR on 02/04/20. CRP 16.6, PCT negative. Full scope treatment commenced with subsequent worsening of hypoxia. CTA chest 02/12/20 revealed worsening airspace disease compatible with progressive covid pneumonia as well as acute bilateral pulmonary emboli without right heart strain.  CXR repeated 1/13, 1/18 and 1/20 significant heterogenous opacities bilaterally. Psychiatry was consulted for medical management of concomitant anxiety.  PCCM consulted for further recommendations 02/28/2020. On 1/23 she became lethargic and was noted to be severely hypercarbic, placed on BiPAP and transferred to ICU.  Hospital course complicated by severe hypoxia and persistent hypercarbia despite BiPAP at night.  Initially on nonrebreather and high flow nasal cannula in order to maintain O2 saturation greater than 90%, weaning off oxygen supplementation as tolerated, now on 4.5 with O2 saturation in the mid 90's.  Repeated chest x-ray on 03/13/2020, personally reviewed revealed worsening left sided pulmonary infiltrates.  Started on Unasyn for suspected superimposed pulmonary bacterial infection.  Drop of hemoglobin acutely from 10.3 down to lowest at 7.2 on 03/14/2020, transfused 1 unit PRBC on 03/14/20.  CT abdomen and pelvis done the same day showed possible hematoma in b/l obturator muscles, confirmed with MRI pelvis with and without contrast the next day 03/15/20, discussed with IR Dr. Grace Isaac who reviewed CTA PE and noted that B/L PEs were small and risk of continuing anticoagulation outweigh benefits, full dose subcu Lovenox twice daily was discontinued on 03/15/20.  Sputum culture  obtained on 03/13/20 + MRSA.  Blood culture x2 ordered 03/16/20.   03/16/20: Seen and examined at bedside. She reversed her CODE STATUS to full code, her bedside RN Haynes Dage present at the time of reversal. She has a persistent cough however has no new complaints. Still with generalized weakness but is determined to get her strength back.  She had a bowel movement today.  Assessment/Plan: Principal Problem:   Acute hypoxemic respiratory failure due to COVID-19 [facility]) Active Problems:   2019 novel coronavirus disease (COVID-19)   Hypertension   Type 2 diabetes mellitus (HCC)   Dyslipidemia   Depression   Class 1 obesity   Pulmonary emboli (HCC)  Improving severe acute hypoxic hypercarbic respiratory failure, multifactorial secondary to COVID-19 viral pneumonia, superimposed by MRSA pneumonia, possibly contributed by recently diagnosed bilateral pulmonary emboli (02/12/20).  Not on oxygen supplementation at baseline. Recently on high flow nasal cannula along with nonrebreather to maintain O2 saturation greater than 92% Suspect undiagnosed OSA, will likely need polysomnography outpatient and pulmonary referral. PCO2 65 PH 7.427 on 03/16/20 Continue BiPAP at night  Continue incentive spirometer and flutter valve. Continue to wean off oxygen supplementation as tolerated. Continue bronchodilators Continue side sleeping or prone sleeping as tolerated Continue out of bed to chair as tolerated Continue to mobilize as tolerated Continue PT OT with assistance and fall precautions.  Sepsis secondary to newly diagnosed MRSA pneumonia WBC 12.5K, heart rate 112, respiratory rate 36. Sputum culture obtained on 03/13/20 + MRSA.   She lost her IV access, start p.o. linezolid, appreciate pharmacy's assistance Obtain blood cultures x2 peripherally Monitor WBC and fever curve  Acute blood loss anemia secondary to moderate to large bilateral obturator muscle hematoma, seen on CT AP (03/14/20) and MRI pelvis  (03/15/20) Post  1 unit PRBC transfusion on 03/14/2020 for hemoglobin of 7.2. Full dose Lovenox twice daily DC'd on 03/15/2020 Hemoglobin 8.5. Continue to monitor H&H  Suspected undiagnosed OSA and OHS Suspect undiagnosed OSA, will likely need polysomnography outpatient and pulmonary referral. PCO2 65 PH 7.427 on 03/16/20 Continue BiPAP at night   Intractable cough in the setting of bilateral PE Continue guaifenesin/codeine syrup as needed  COVID-19 viral pneumonia, superimposed by MRSA pneumonia -Tested positive on 02/04/2020.  Off isolation -Completed full course of Remdesivir and baricitinib -Received three full days of Unasyn. -Started on linezolid on 03/16/2020 for MRSA pneumonia. -Continue to encourage IS, encourage prone positioning, encourage mobilization as tolerated Continue Pulmicort, Brovana, albuterol Wean off prednisone, taper off. Inflammatory markers for Covid are improving  Acute bilateral PE, no right heart strain on CT scan Discontinued full dose subcu Lovenox twice daily on 03/15/2020 due to intramuscular hematoma. Bilateral Doppler ultrasound negative for DVT on 02/12/2020.  Resolved constipation Continue bowel regimen: Senokot 2 tablet twice daily, MiraLAX daily, Dulcolax suppository as needed  Diabetes mellitus type 2 with hyperglycemia CBG stable and well-controlled. Continue Lantus 40 units twice daily and NovoLog 3 units 3 times daily. Continue SSI scale. Hemoglobin A1c 7.7 on 02/14/2020  Chronic anxiety/depression/insomnia  She was on Remeron PTA, continue as recommended by psychiatry, 7.5 mg nightly. Avoid using benzodiazepines, discontinued on 03/16/2018. Seroquel dose increased to 25 mg twice daily for anxiety and hallucinations. Last QTC 422 on 03/13/2020.  Obesity -Estimated body mass index is 33.15 kg/m as calculated from the following:   Height as of this encounter: 5\' 8"  (1.727 m).   Weight as of this encounter: 98.9 kg. When stable outpatient regular  physical activity and healthy dieting.  Abnormal TSH, subclinical hyperthyroidism Normal Free T4, TSH 0.308. -Repeat as outpatient in 4 to 6 weeks when acute illness has resolved  Generalized weakness/ambulatory dysfunction PT assessment recommended home health PT OT. Continue out of bed to chair as tolerated Continue to mobilize as tolerated Continue fall precautions   DVT prophylaxis: SCDs.   Code Status: DNR Family Communicati updated husband via phone on 03/15/2020.    Status is: Inpatient    Dispo:  Patient From: Home  Planned Disposition: Home with Health Care Svc  Expected discharge date: 03/19/2020  Medically stable for discharge: No, ongoing management of COVID-19 viral pneumonia, MRSA pneumonia, acute hypoxic hypercarbic respiratory failure.         Objective: Vitals:   03/16/20 1200 03/16/20 1231 03/16/20 1300 03/16/20 1400  BP:  113/67    Pulse: (!) 112  (!) 110 (!) 112  Resp: (!) 28 (!) 24 (!) 24 (!) 24  Temp:  98.7 F (37.1 C)    TempSrc:  Axillary    SpO2: 97%  98% 91%  Weight:      Height:        Intake/Output Summary (Last 24 hours) at 03/16/2020 1523 Last data filed at 03/15/2020 1900 Gross per 24 hour  Intake --  Output 1100 ml  Net -1100 ml   Filed Weights   02/04/20 1838  Weight: 98.9 kg    Exam:  . General: 60 y.o. year-old female pleasant chronically ill-appearing in no acute distress. She is alert oriented x3.  . Cardiovascular: Tachycardic with no rubs or gallops. Marland Kitchen Respiratory: Mild rales at bases no wheezing noted. Good respiratory effort. . Abdomen: Obese nontender normal bowel sounds present.  . Musculoskeletal: Trace lower extremity edema bilaterally.  Marland Kitchen Psychiatry: Mood is appropriate for condition and setting.  Data Reviewed: CBC: Recent Labs  Lab 03/13/20 0434 03/14/20 0747 03/14/20 1357 03/15/20 0105 03/15/20 0918 03/15/20 1912 03/16/20 0516  WBC 19.6* 15.8*  --  15.7*  --   --  12.5*  NEUTROABS  --   12.0*  --  12.0*  --   --  9.8*  HGB 7.8* 7.5* 7.2* 8.2* 9.0* 8.6* 8.5*  HCT 25.5* 25.3* 23.7* 26.4* 29.1* 28.1* 28.7*  MCV 97.3 99.2  --  98.5  --   --  100.3*  PLT 331 334  --  285  --   --  331   Basic Metabolic Panel: Recent Labs  Lab 03/13/20 0434 03/14/20 0747 03/15/20 0105  NA 137 138 138  K 4.2 4.0 4.4  CL 88* 88* 89*  CO2 41* 38* 39*  GLUCOSE 162* 177* 146*  BUN 11 13 10   CREATININE 0.50 0.41* 0.47  CALCIUM 8.8* 8.9 8.9  MG 2.1  --  2.1  PHOS 3.8  --  4.3   GFR: Estimated Creatinine Clearance: 95.4 mL/min (by C-G formula based on SCr of 0.47 mg/dL). Liver Function Tests: Recent Labs  Lab 03/15/20 0105  AST 32  ALT 67*  ALKPHOS 66  BILITOT 0.7  PROT 5.9*  ALBUMIN 2.8*   No results for input(s): LIPASE, AMYLASE in the last 168 hours. No results for input(s): AMMONIA in the last 168 hours. Coagulation Profile: No results for input(s): INR, PROTIME in the last 168 hours. Cardiac Enzymes: No results for input(s): CKTOTAL, CKMB, CKMBINDEX, TROPONINI in the last 168 hours. BNP (last 3 results) No results for input(s): PROBNP in the last 8760 hours. HbA1C: No results for input(s): HGBA1C in the last 72 hours. CBG: Recent Labs  Lab 03/14/20 1736 03/14/20 2104 03/15/20 0805 03/15/20 2010 03/16/20 0814  GLUCAP 150* 185* 157* 281* 139*   Lipid Profile: No results for input(s): CHOL, HDL, LDLCALC, TRIG, CHOLHDL, LDLDIRECT in the last 72 hours. Thyroid Function Tests: No results for input(s): TSH, T4TOTAL, FREET4, T3FREE, THYROIDAB in the last 72 hours. Anemia Panel: Recent Labs    03/14/20 0743  FERRITIN 310*  TIBC 310  IRON 75   Urine analysis: No results found for: COLORURINE, APPEARANCEUR, LABSPEC, PHURINE, GLUCOSEU, HGBUR, BILIRUBINUR, KETONESUR, PROTEINUR, UROBILINOGEN, NITRITE, LEUKOCYTESUR Sepsis Labs: @LABRCNTIP (procalcitonin:4,lacticidven:4)  ) Recent Results (from the past 240 hour(s))  Expectorated sputum assessment w rflx to resp cult      Status: None   Collection Time: 03/13/20  4:42 PM   Specimen: Expectorated Sputum  Result Value Ref Range Status   Specimen Description EXPECTORATED SPUTUM  Final   Special Requests NONE  Final   Sputum evaluation   Final    THIS SPECIMEN IS ACCEPTABLE FOR SPUTUM CULTURE Performed at [facility], [address] [place], [place]    Report Status 03/13/2020 FINAL  Final  Culture, respiratory     Status: None   Collection Time: 03/13/20  4:42 PM  Result Value Ref Range Status   Specimen Description   Final    EXPECTORATED SPUTUM Performed at [facility], [address], [place], [place]    Special Requests   Final    NONE Reflexed from [id number] Performed at [facility], [address], [place], [place]    Gram Stain   Final    FEW WBC PRESENT, PREDOMINANTLY PMN MODERATE GRAM POSITIVE COCCI IN PAIRS IN CLUSTERS MODERATE GRAM POSITIVE RODS Performed at [facility], [address], [place], [place] [address]    Culture   Final    ABUNDANT METHICILLIN RESISTANT STAPHYLOCOCCUS AUREUS   Report Status 03/16/2020 FINAL  Final   Organism ID, Bacteria METHICILLIN RESISTANT STAPHYLOCOCCUS AUREUS  Final      Susceptibility   Methicillin resistant staphylococcus aureus - MIC*    CIPROFLOXACIN >=8 RESISTANT Resistant     ERYTHROMYCIN >=8 RESISTANT Resistant     GENTAMICIN <=0.5 SENSITIVE Sensitive     OXACILLIN >=4 RESISTANT Resistant     TETRACYCLINE <=1 SENSITIVE Sensitive     VANCOMYCIN 1 SENSITIVE Sensitive     TRIMETH/SULFA <=10 SENSITIVE Sensitive     CLINDAMYCIN <=0.25 SENSITIVE Sensitive     RIFAMPIN <=0.5 SENSITIVE Sensitive     Inducible Clindamycin NEGATIVE Sensitive     * ABUNDANT METHICILLIN RESISTANT STAPHYLOCOCCUS AUREUS      Studies: No results found.  Scheduled Meds: . sodium chloride   Intravenous Once  . amoxicillin-clavulanate  2 tablet Oral Once  . arformoterol   15 mcg Nebulization BID  . vitamin C  500 mg Oral Daily  . benzonatate  200 mg Oral BID  . bisacodyl  10 mg Rectal Once  . budesonide  0.5 mg Nebulization BID  . chlorhexidine  15 mL Mouth Rinse BID  . Chlorhexidine Gluconate Cloth  6 each Topical Daily  . cholecalciferol  1,000 Units Oral Daily  . feeding supplement  237 mL Oral BID BM  . insulin aspart  0-20 Units Subcutaneous TID WC  . insulin aspart  0-5 Units Subcutaneous QHS  . insulin aspart  3 Units Subcutaneous TID WC  . insulin glargine  4 Units Subcutaneous BID  . mouth rinse  15 mL Mouth Rinse q12n4p  . mirtazapine  7.5 mg Oral QHS  . multivitamin with minerals  1 tablet Oral Daily  . polyethylene glycol  17 g Oral BID  . predniSONE  30 mg Oral Q breakfast  . QUEtiapine  25 mg Oral BID  . senna-docusate  2 tablet Oral BID  . zinc sulfate  220 mg Oral Daily    Continuous Infusions: . ampicillin-sulbactam (UNASYN) IV 3 g (03/16/20 1037)     LOS: 40 days     Darlin Drop, MD [facility] Pager [phone]  If 7PM-7AM, please contact night-coverage [email or web address] Password [facility] 03/16/2020, 3:23 PM

## 2020-03-16 NOTE — Progress Notes (Signed)
Pt has poor venous access, attempted to restart IV another RN attempted along with IV team via [place], all unsuccessful attempts. Pt has had multiple IVs since admission. Consulted Anesthesiology for assistance in the restart.

## 2020-03-16 NOTE — Progress Notes (Signed)
Pt currently on 4.5 liters sat are 96% and pt turned and positioned for bath, pt desat to 91% and recovered to 96% on 4.5 liters. Pt alert and talking with SOB with exertions only. Will cont to monitor. SRP, RN

## 2020-03-16 NOTE — Progress Notes (Signed)
Pt has poor vasculature. Possibly a candidate for a midline. RN notified and will call IR.

## 2020-03-17 DIAGNOSIS — J9601 Acute respiratory failure with hypoxia: Secondary | ICD-10-CM

## 2020-03-17 DIAGNOSIS — U071 COVID-19: Principal | ICD-10-CM

## 2020-03-17 LAB — CBC WITH DIFFERENTIAL/PLATELET
Abs Immature Granulocytes: 0.17 10*3/uL — ABNORMAL HIGH (ref 0.00–0.07)
Basophils Absolute: 0 10*3/uL (ref 0.0–0.1)
Basophils Relative: 0 %
Eosinophils Absolute: 0 10*3/uL (ref 0.0–0.5)
Eosinophils Relative: 0 %
HCT: 28.1 % — ABNORMAL LOW (ref 36.0–46.0)
Hemoglobin: 8.4 g/dL — ABNORMAL LOW (ref 12.0–15.0)
Immature Granulocytes: 1 %
Lymphocytes Relative: 14 %
Lymphs Abs: 1.9 10*3/uL (ref 0.7–4.0)
MCH: 29.8 pg (ref 26.0–34.0)
MCHC: 29.9 g/dL — ABNORMAL LOW (ref 30.0–36.0)
MCV: 99.6 fL (ref 80.0–100.0)
Monocytes Absolute: 0.8 10*3/uL (ref 0.1–1.0)
Monocytes Relative: 6 %
Neutro Abs: 10.1 10*3/uL — ABNORMAL HIGH (ref 1.7–7.7)
Neutrophils Relative %: 79 %
Platelets: 350 10*3/uL (ref 150–400)
RBC: 2.82 MIL/uL — ABNORMAL LOW (ref 3.87–5.11)
RDW: 18.3 % — ABNORMAL HIGH (ref 11.5–15.5)
WBC: 13 10*3/uL — ABNORMAL HIGH (ref 4.0–10.5)
nRBC: 0.7 % — ABNORMAL HIGH (ref 0.0–0.2)

## 2020-03-17 LAB — COMPREHENSIVE METABOLIC PANEL
ALT: 51 U/L — ABNORMAL HIGH (ref 0–44)
AST: 20 U/L (ref 15–41)
Albumin: 2.8 g/dL — ABNORMAL LOW (ref 3.5–5.0)
Alkaline Phosphatase: 71 U/L (ref 38–126)
Anion gap: 8 (ref 5–15)
BUN: 9 mg/dL (ref 6–20)
CO2: 38 mmol/L — ABNORMAL HIGH (ref 22–32)
Calcium: 8.7 mg/dL — ABNORMAL LOW (ref 8.9–10.3)
Chloride: 92 mmol/L — ABNORMAL LOW (ref 98–111)
Creatinine, Ser: 0.37 mg/dL — ABNORMAL LOW (ref 0.44–1.00)
GFR, Estimated: >60 mL/min (ref >60)
Glucose, Bld: 133 mg/dL — ABNORMAL HIGH (ref 70–99)
Potassium: 4.3 mmol/L (ref 3.5–5.1)
Sodium: 138 mmol/L (ref 135–145)
Total Bilirubin: 0.8 mg/dL (ref 0.3–1.2)
Total Protein: 5.9 g/dL — ABNORMAL LOW (ref 6.5–8.1)

## 2020-03-17 LAB — GLUCOSE, CAPILLARY
Glucose-Capillary: 135 mg/dL — ABNORMAL HIGH (ref 70–99)
Glucose-Capillary: 253 mg/dL — ABNORMAL HIGH (ref 70–99)
Glucose-Capillary: 282 mg/dL — ABNORMAL HIGH (ref 70–99)
Glucose-Capillary: 141 mg/dL — ABNORMAL HIGH (ref 70–99)
Glucose-Capillary: 65 mg/dL — ABNORMAL LOW (ref 70–99)

## 2020-03-17 LAB — MAGNESIUM: Magnesium: 2.3 mg/dL (ref 1.7–2.4)

## 2020-03-17 MED ORDER — GABAPENTIN 100 MG PO CAPS
100.0000 mg | ORAL_CAPSULE | Freq: Two times a day (BID) | ORAL | Status: DC
  Administered 2020-03-17 – 2020-03-21 (×9): 100 mg via ORAL
  Filled 2020-03-17 (×9): qty 1

## 2020-03-17 MED ORDER — MIRTAZAPINE 15 MG PO TABS
15.0000 mg | ORAL_TABLET | Freq: Every day | ORAL | Status: DC
  Administered 2020-03-17 – 2020-03-20 (×4): 15 mg via ORAL
  Filled 2020-03-17 (×4): qty 1

## 2020-03-17 MED ORDER — PREDNISONE 5 MG PO TABS
15.0000 mg | ORAL_TABLET | Freq: Every day | ORAL | Status: DC
  Administered 2020-03-18: 15 mg via ORAL
  Filled 2020-03-17: qty 1

## 2020-03-17 MED ORDER — LINEZOLID 600 MG/300ML IV SOLN
600.0000 mg | Freq: Two times a day (BID) | INTRAVENOUS | Status: DC
  Administered 2020-03-17 – 2020-03-18 (×3): 600 mg via INTRAVENOUS
  Filled 2020-03-17 (×3): qty 300

## 2020-03-17 NOTE — Progress Notes (Signed)
CRITICAL VALUE ALERT  Critical Value:  MRSA positive  Date & Time Notied:  03/16/20@2142   Provider Notified: N/A  Orders Received/Actions taken: Protocol initiated.

## 2020-03-17 NOTE — Progress Notes (Signed)
PROGRESS NOTE  Christina Vincent VHQ:[id number] DOB: May 09, 1960 DOA: 02/04/2020 PCP: Avon Gully, MD  HPI/Recap of past 24 hours: Tamecca D Siddleis a 60 y.o.femalewith a history of T2DM, HTN, HLD, obesity, chronic depression/anxiety, insomnia who presented with fever and dyspnea found tohave positive SARS-CoV-2 PCR and mild hypoxia despite clear CXR on 02/04/20.  She was found to have acute bilateral pulmonary emboli without right heart strain.  CXR repeated 1/13, 1/18 and 1/20 significant heterogenous opacities bilaterally. Psychiatry was consulted for medical management of concomitant anxiety.  PCCM consulted for further recommendations 02/28/2020. On 1/23 she became lethargic and was noted to be severely hypercarbic, placed on BiPAP and transferred to ICU.  Hospital course complicated by severe hypoxia and persistent hypercarbia despite BiPAP at night.  Initially on nonrebreather and high flow nasal cannula in order to maintain O2 saturation greater than 90%, ongoing weaning of oxygen supplementation, now on 4 with O2 saturation in the mid 90's.  Repeated chest x-ray on 03/13/2020, personally reviewed revealed worsening left sided pulmonary infiltrates.  Started on Unasyn for suspected superimposed pulmonary bacterial infection.  Sputum culture obtained and returned positive for MRSA.  Antibiotics switched to linezolid.  Blood cultures obtained 03/16/20.  Drop of hemoglobin acutely from 10.3 down to lowest at 7.2 on 03/14/2020, transfused 1 unit PRBC on 03/14/20.  CT abdomen and pelvis revealed hematoma in b/l obturator muscles, confirmed with MRI pelvis with and without contrast 03/15/20, discussed with IR Dr. Grace Isaac who reviewed CTA PE and noted B/L PEs were small.  Risk of continuing anticoagulation outweigh benefits, full dose subcu Lovenox twice daily was discontinued on 03/15/20.  She reversed her CODE STATUS to full code 03/16/20.  03/17/20: Seen at her bedside this morning.  She is very anxious.  States  she did not sleep well last night, woke up around 1:30 AM and could not return back to sleep.  Assessment/Plan: Principal Problem:   Acute hypoxemic respiratory failure due to COVID-19 [facility]) Active Problems:   2019 novel coronavirus disease (COVID-19)   Hypertension   Type 2 diabetes mellitus (HCC)   Dyslipidemia   Depression   Class 1 obesity   Pulmonary emboli (HCC)  Improving severe acute hypoxic hypercarbic respiratory failure, multifactorial secondary to COVID-19 viral pneumonia, superimposed by MRSA pneumonia, possibly contributed by recently diagnosed bilateral pulmonary emboli (02/12/20).  Not on oxygen supplementation at baseline. Recently on high flow nasal cannula along with nonrebreather to maintain O2 saturation greater than 92% Suspect undiagnosed OSA, will likely need polysomnography outpatient and pulmonary referral. PCO2 65 PH 7.427 on 03/16/20 Continue BiPAP at night  TOC consulted to assist with Trilogy NIV for DC planning. Continue current management. Continue incentive spirometer and flutter valve. Continue to wean off oxygen supplementation as tolerated. Continue bronchodilators Continue side sleeping or prone sleeping as tolerated Continue out of bed to chair as tolerated Continue to mobilize as tolerated Continue PT OT with assistance and fall precautions.  Uncontrolled anxiety Psychiatry reconsulted on 03/17/2020 Appreciate assistance. Continue to avoid benzodiazepines due to suspected undiagnosed OSA.  Chronic insomnia Appreciate psychiatrist assistance.  Sepsis secondary to newly diagnosed MRSA pneumonia WBC 12.5K, heart rate 112, respiratory rate 36. Sputum culture obtained on 03/13/20 + MRSA.  Continue linezolid, Day #2. Continue to monitor WBC and fever curve  Acute blood loss anemia secondary to moderate to large bilateral obturator muscle hematoma, seen on CT AP (03/14/20) and MRI pelvis (03/15/20) Post 1 unit PRBC transfusion on 03/14/2020 for  hemoglobin of 7.2. Full dose Lovenox  twice daily DC'd on 03/15/2020 Hemoglobin 8.5. Continue to monitor H&H  Suspected undiagnosed OSA and OHS Suspect undiagnosed OSA, will likely need polysomnography outpatient and pulmonary referral. PCO2 65 PH 7.427 on 03/16/20 Continue BiPAP at night  Will need trilogy at discharge.  Intractable cough in the setting of bilateral PE Continue guaifenesin/codeine syrup as needed  COVID-19 viral pneumonia, superimposed by MRSA pneumonia -Tested positive on 02/04/2020.  Off isolation -Completed full course of Remdesivir and baricitinib -Received three full days of Unasyn. -Started on linezolid on 03/16/2020 for MRSA pneumonia. -Continue to encourage IS, encourage prone positioning, encourage mobilization as tolerated Continue Pulmicort, Brovana, albuterol Wean off prednisone, taper off. Inflammatory markers for Covid are improving  Acute bilateral PE, no right heart strain on CT scan Discontinued full dose subcu Lovenox twice daily on 03/15/2020 due to intramuscular hematoma. Bilateral Doppler ultrasound negative for DVT on 02/12/2020.  Resolved constipation Continue bowel regimen: Senokot 2 tablet twice daily, MiraLAX daily, Dulcolax suppository as needed  Diabetes mellitus type 2 with hyperglycemia CBG stable and well-controlled. Continue Lantus 40 units twice daily and NovoLog 3 units 3 times daily. Continue SSI scale. Hemoglobin A1c 7.7 on 02/14/2020  Chronic anxiety/depression/insomnia  She was on Remeron PTA, continue as recommended by psychiatry, 7.5 mg nightly. Avoid using benzodiazepines, discontinued on 03/16/2018. Seroquel dose increased to 25 mg twice daily for anxiety and hallucinations. Last QTC 422 on 03/13/2020.  Obesity -Estimated body mass index is 33.15 kg/m as calculated from the following:   Height as of this encounter: 5\' 8"  (1.727 m).   Weight as of this encounter: 98.9 kg. When stable outpatient regular physical activity and  healthy dieting.  Abnormal TSH, subclinical hyperthyroidism Normal Free T4, TSH 0.308. -Repeat as outpatient in 4 to 6 weeks when acute illness has resolved  Generalized weakness/ambulatory dysfunction PT assessment recommended home health PT OT. Continue out of bed to chair as tolerated Continue to mobilize as tolerated Continue fall precautions   DVT prophylaxis: SCDs.   Code Status: DNR Family Communicati updated husband via phone on 03/15/2020.    Status is: Inpatient    Dispo:  Patient From: Home  Planned Disposition: Home with Health Care Svc  Expected discharge date: 03/21/2020  Medically stable for discharge: No, ongoing management of COVID-19 viral pneumonia, MRSA pneumonia, acute hypoxic hypercarbic respiratory failure.         Objective: Vitals:   03/17/20 0000 03/17/20 0044 03/17/20 0400 03/17/20 1225  BP: 104/62  94/60 113/81  Pulse:   84 100  Resp:  17 19 (!) 22  Temp:      TempSrc:    Oral  SpO2: 96%  99% 93%  Weight:      Height:        Intake/Output Summary (Last 24 hours) at 03/17/2020 1325 Last data filed at 03/17/2020 1000 Gross per 24 hour  Intake 1210 ml  Output 2250 ml  Net -1040 ml   Filed Weights   02/04/20 1838  Weight: 98.9 kg    Exam:  . General: 60 y.o. year-old female pleasant well-developed well-nourished no distress.  Alert and oriented x3.   . Cardiovascular: Regular rate and rhythm no rubs or gallops.   60 Respiratory: Mild rales at bases no wheezing noted. . Abdomen: Obese nontender normal bowel sounds present.  . Musculoskeletal: Trace lower extremity edema bilaterally.   Marland Kitchen Psychiatry: Mood is appropriate for condition and setting.  Data Reviewed: CBC: Recent Labs  Lab 03/13/20 0434 03/14/20 0747 03/14/20 1357 03/15/20 0105  03/15/20 0918 03/15/20 1912 03/16/20 0516 03/17/20 0513  WBC 19.6* 15.8*  --  15.7*  --   --  12.5* 13.0*  NEUTROABS  --  12.0*  --  12.0*  --   --  9.8* 10.1*  HGB 7.8* 7.5*   < >  8.2* 9.0* 8.6* 8.5* 8.4*  HCT 25.5* 25.3*   < > 26.4* 29.1* 28.1* 28.7* 28.1*  MCV 97.3 99.2  --  98.5  --   --  100.3* 99.6  PLT 331 334  --  285  --   --  331 350   < > = values in this interval not displayed.   Basic Metabolic Panel: Recent Labs  Lab 03/13/20 0434 03/14/20 0747 03/15/20 0105 03/17/20 0513  NA 137 138 138 138  K 4.2 4.0 4.4 4.3  CL 88* 88* 89* 92*  CO2 41* 38* 39* 38*  GLUCOSE 162* 177* 146* 133*  BUN 11 13 10 9   CREATININE 0.50 0.41* 0.47 0.37*  CALCIUM 8.8* 8.9 8.9 8.7*  MG 2.1  --  2.1 2.3  PHOS 3.8  --  4.3  --    GFR: Estimated Creatinine Clearance: 95.4 mL/min (A) (by C-G formula based on SCr of 0.37 mg/dL (L)). Liver Function Tests: Recent Labs  Lab 03/15/20 0105 03/17/20 0513  AST 32 20  ALT 67* 51*  ALKPHOS 66 71  BILITOT 0.7 0.8  PROT 5.9* 5.9*  ALBUMIN 2.8* 2.8*   No results for input(s): LIPASE, AMYLASE in the last 168 hours. No results for input(s): AMMONIA in the last 168 hours. Coagulation Profile: No results for input(s): INR, PROTIME in the last 168 hours. Cardiac Enzymes: No results for input(s): CKTOTAL, CKMB, CKMBINDEX, TROPONINI in the last 168 hours. BNP (last 3 results) No results for input(s): PROBNP in the last 8760 hours. HbA1C: No results for input(s): HGBA1C in the last 72 hours. CBG: Recent Labs  Lab 03/15/20 2010 03/16/20 0814 03/16/20 2313 03/17/20 0827 03/17/20 1223  GLUCAP 281* 139* 134* 135* 253*   Lipid Profile: No results for input(s): CHOL, HDL, LDLCALC, TRIG, CHOLHDL, LDLDIRECT in the last 72 hours. Thyroid Function Tests: No results for input(s): TSH, T4TOTAL, FREET4, T3FREE, THYROIDAB in the last 72 hours. Anemia Panel: No results for input(s): VITAMINB12, FOLATE, FERRITIN, TIBC, IRON, RETICCTPCT in the last 72 hours. Urine analysis: No results found for: COLORURINE, APPEARANCEUR, LABSPEC, PHURINE, GLUCOSEU, HGBUR, BILIRUBINUR, KETONESUR, PROTEINUR, UROBILINOGEN, NITRITE, LEUKOCYTESUR Sepsis  Labs: @LABRCNTIP (procalcitonin:4,lacticidven:4)  ) Recent Results (from the past 240 hour(s))  Expectorated sputum assessment w rflx to resp cult     Status: None   Collection Time: 03/13/20  4:42 PM   Specimen: Expectorated Sputum  Result Value Ref Range Status   Specimen Description EXPECTORATED SPUTUM  Final   Special Requests NONE  Final   Sputum evaluation   Final    THIS SPECIMEN IS ACCEPTABLE FOR SPUTUM CULTURE Performed at [facility], [address], [place], [place]    Report Status 03/13/2020 FINAL  Final  Culture, respiratory     Status: None   Collection Time: 03/13/20  4:42 PM  Result Value Ref Range Status   Specimen Description   Final    EXPECTORATED SPUTUM Performed at [facility], [address] [place], [place]    Special Requests   Final    NONE Reflexed from [id number] Performed at [facility], [address], [place], [place]    Gram Stain  Final    FEW WBC PRESENT, PREDOMINANTLY PMN MODERATE GRAM POSITIVE COCCI IN PAIRS IN CLUSTERS MODERATE GRAM POSITIVE RODS Performed at [facility], [address], [place], [place] [address]    Culture   Final    ABUNDANT METHICILLIN RESISTANT STAPHYLOCOCCUS AUREUS   Report Status 03/16/2020 FINAL  Final   Organism ID, Bacteria METHICILLIN RESISTANT STAPHYLOCOCCUS AUREUS  Final      Susceptibility   Methicillin resistant staphylococcus aureus - MIC*    CIPROFLOXACIN >=8 RESISTANT Resistant     ERYTHROMYCIN >=8 RESISTANT Resistant     GENTAMICIN <=0.5 SENSITIVE Sensitive     OXACILLIN >=4 RESISTANT Resistant     TETRACYCLINE <=1 SENSITIVE Sensitive     VANCOMYCIN 1 SENSITIVE Sensitive     TRIMETH/SULFA <=10 SENSITIVE Sensitive     CLINDAMYCIN <=0.25 SENSITIVE Sensitive     RIFAMPIN <=0.5 SENSITIVE Sensitive     Inducible Clindamycin NEGATIVE Sensitive     * ABUNDANT METHICILLIN RESISTANT STAPHYLOCOCCUS AUREUS  MRSA  PCR Screening     Status: Abnormal   Collection Time: 03/16/20  6:30 PM   Specimen: Nasal Mucosa; Nasopharyngeal  Result Value Ref Range Status   MRSA by PCR POSITIVE (A) NEGATIVE Final    Comment:        The GeneXpert MRSA Assay (FDA approved for NASAL specimens only), is one component of a comprehensive MRSA colonization surveillance program. It is not intended to diagnose MRSA infection nor to guide or monitor treatment for MRSA infections. RESULT CALLED TO, READ BACK BY AND VERIFIED WITH: NGUYEN,TILDA 03/16/20 @2144  BY SEEL,MOLLY Performed at [facility], [address] [place], [place] [address]       Studies: No results found.  Scheduled Meds: . sodium chloride   Intravenous Once  . arformoterol  15 mcg Nebulization BID  . vitamin C  500 mg Oral Daily  . benzonatate  200 mg Oral BID  . bisacodyl  10 mg Rectal Once  . budesonide  0.5 mg Nebulization BID  . chlorhexidine  15 mL Mouth Rinse BID  . Chlorhexidine Gluconate Cloth  6 each Topical Daily  . cholecalciferol  1,000 Units Oral Daily  . feeding supplement  237 mL Oral BID BM  . gabapentin  100 mg Oral BID  . insulin aspart  0-20 Units Subcutaneous TID WC  . insulin aspart  0-5 Units Subcutaneous QHS  . insulin aspart  3 Units Subcutaneous TID WC  . insulin glargine  4 Units Subcutaneous BID  . mouth rinse  15 mL Mouth Rinse q12n4p  . mirtazapine  15 mg Oral QHS  . multivitamin with minerals  1 tablet Oral Daily  . mupirocin ointment  1 application Nasal BID  . polyethylene glycol  17 g Oral BID  . [START ON 03/18/2020] predniSONE  15 mg Oral Q breakfast  . QUEtiapine  25 mg Oral BID  . senna-docusate  2 tablet Oral BID  . sodium chloride flush  10-40 mL Intracatheter Q12H  . zinc sulfate  220 mg Oral Daily    Continuous Infusions: . linezolid (ZYVOX) IV 600 mg (03/17/20 1213)     LOS: 41 days     Darlin Drop, MD [facility] Pager [phone]  If 7PM-7AM, please  contact night-coverage [email or web address] Password [email or web address] 03/17/2020, 1:25 PM

## 2020-03-17 NOTE — Progress Notes (Signed)
I called pt on phone due to contact precautions. Pt states she is doing fair and that she hanging in there. She said she has not needs at present.  The chaplain offered listening ear. Further visits will be offered.

## 2020-03-17 NOTE — Consult Note (Signed)
[facility] Face-to-Face Psychiatry Consult   Reason for Consult:  Severe anxiety Referring Physician:  Dr. Jarvis Newcomer Patient Identification: Christina Vincent MRN:  [id number] Principal Diagnosis: Acute hypoxemic respiratory failure due to COVID-19 [facility]) Diagnosis:  Principal Problem:   Acute hypoxemic respiratory failure due to COVID-19 [facility]) Active Problems:   2019 novel coronavirus disease (COVID-19)   Hypertension   Type 2 diabetes mellitus (HCC)   Dyslipidemia   Depression   Class 1 obesity   Pulmonary emboli (HCC)   Total Time spent with patient: 30 minutes  Subjective:   Christina Vincent is a 60 y.o. female patient admitted with hypoxic respiratory failure 2/t COVID -19 and PE. Psych consult placed for severe anxiety related to healthcare. Patient is seen and Clinical research associate introduced self. She endorses a significant amount of anxiety related to "my health. I get over one thing and its another. I cant seem to get better. I havent been able to sleep. I been up since 1:30 this morning. Everything has been switched around. " She is remains vague and provides generalized answers to specific questions throughout the evaluation. When asked to describe her anxiety " Everything. Im overwhelmed. I feel smothered and cant stop crying. No one knows how it feels until you are in this bed."  At current she endorses worsening anxiety, however is unable to identify a new calls.  When assessing for additional factors she just reports feeling overwhelmed,'s smothered,, uncontrollable crying.  She endorses some feelings of anhedonia, and hopeless as it relates to ongoing medical conditions most recently bilateral pulmonary infiltrates, and MRSA pneumonia.  She currently rates her anxiety at a 9 out of 10 with 10 being the worst on Likert scale.  At this time she denies any thoughts of wanting to harm herself.  She denies any suicidal ideation, homicidal ideation, and auditory visual hallucination.   HPI:  Christina Vincent is a 60  y.o. female with medical history significant of depression, dyslipidemia, type 2 diabetes, class I obesity, hypertension who is coming to the emergency department due to progressively worse shortness of breath for the past 6 days associated with fever, chills, fatigue, malaise, sore throat, occasionally productive of whitish/clear dry cough and rhinorrhea.  She denies travel history or known Covid exposures.  She has not been vaccinated with any other COVID-19 vaccines.  On ROS, she also complains of dizziness and pleuritic chest pain, but denies palpitations, diaphoresis, PND, orthopnea or pitting edema of the lower extremities.  She denies abdominal pain, nausea, vomiting, diarrhea, constipation, melena or hematochezia.  No dysuria, frequency or hematuria.  No polyuria, polydipsia, polyphagia or blurred vision.  Past Psychiatric History: Depression, previously stable on mirtazapine and Lexapro.  Was receiving monthly services from [facility] behavioral health outpatient in [place].  She denies any history of suicide attempts.  She denies any previous inpatient admission.  She denies any substance abuse.  Risk to Self:  No Risk to Others:  No Prior Inpatient Therapy:  Denies Prior Outpatient Therapy:  See above  Past Medical History:  Past Medical History:  Diagnosis Date  . Depression   . Dyslipidemia     Past Surgical History:  Procedure Laterality Date  . COLONOSCOPY     1990's; hemorrhoids   Family History:  Family History  Problem Relation Age of Onset  . Alcohol abuse Father   . Colon cancer Neg Hx    Family Psychiatric  History: Denies Social History:  Social History   Substance and Sexual  Activity  Alcohol Use Yes   Comment: occ glass of wine     Social History   Substance and Sexual Activity  Drug Use Never    Social History   Socioeconomic History  . Marital status: Married    Spouse name: Not on file  . Number of children: Not on file  . Years of  education: Not on file  . Highest education level: Not on file  Occupational History  . Not on file  Tobacco Use  . Smoking status: Former Games developermoker  . Smokeless tobacco: Never Used  . Tobacco comment: quit 1988  Vaping Use  . Vaping Use: Never used  Substance and Sexual Activity  . Alcohol use: Yes    Comment: occ glass of wine  . Drug use: Never  . Sexual activity: Not on file  Other Topics Concern  . Not on file  Social History Narrative  . Not on file   Social Determinants of Health   Financial Resource Strain: Not on file  Food Insecurity: Not on file  Transportation Needs: Not on file  Physical Activity: Not on file  Stress: Not on file  Social Connections: Not on file   Additional Social History:    Allergies:  No Known Allergies  Labs:  Results for orders placed or performed during the hospital encounter of 02/04/20 (from the past 48 hour(s))  Hemoglobin and hematocrit, blood     Status: Abnormal   Collection Time: 03/15/20  7:12 PM  Result Value Ref Range   Hemoglobin 8.6 (L) 12.0 - 15.0 g/dL   HCT 16.128.1 (L) 09.636.0 - 04.546.0 %    Comment: Performed at [facility], [address] [place], [place] [address]  Glucose, capillary     Status: Abnormal   Collection Time: 03/15/20  8:10 PM  Result Value Ref Range   Glucose-Capillary 281 (H) 70 - 99 mg/dL    Comment: Glucose reference range applies only to samples taken after fasting for at least 8 hours.  CBC with Differential/Platelet     Status: Abnormal   Collection Time: 03/16/20  5:16 AM  Result Value Ref Range   WBC 12.5 (H) 4.0 - 10.5 K/uL   RBC 2.86 (L) 3.87 - 5.11 MIL/uL   Hemoglobin 8.5 (L) 12.0 - 15.0 g/dL   HCT 19.128.7 (L) 47.836.0 - 29.546.0 %   MCV 100.3 (H) 80.0 - 100.0 fL   MCH 29.7 26.0 - 34.0 pg   MCHC 29.6 (L) 30.0 - 36.0 g/dL   RDW 62.118.5 (H) 30.811.5 - 65.715.5 %   Platelets 331 150 - 400 K/uL   nRBC 1.4 (H) 0.0 - 0.2 %   Neutrophils Relative % 78 %   Neutro Abs 9.8 (H) 1.7 - 7.7 K/uL    Lymphocytes Relative 13 %   Lymphs Abs 1.7 0.7 - 4.0 K/uL   Monocytes Relative 7 %   Monocytes Absolute 0.8 0.1 - 1.0 K/uL   Eosinophils Relative 0 %   Eosinophils Absolute 0.0 0.0 - 0.5 K/uL   Basophils Relative 0 %   Basophils Absolute 0.0 0.0 - 0.1 K/uL   Immature Granulocytes 2 %   Abs Immature Granulocytes 0.23 (H) 0.00 - 0.07 K/uL    Comment: Performed at [facility], [address] [place], [place] [address]  Blood gas, arterial     Status: Abnormal   Collection Time: 03/16/20  7:00 AM  Result Value Ref Range   O2 Content 12LPM L/min  Delivery systems NASAL CANNULA    pH, Arterial 7.427 7.350 - 7.450   pCO2 arterial 65.3 (HH) 32.0 - 48.0 mmHg    Comment: CRITICAL RESULT CALLED TO, READ BACK BY AND VERIFIED WITH: S,TICKET AT 0725 ON 03/16/20 BY A,MOHAMED    pO2, Arterial 56.8 (L) 83.0 - 108.0 mmHg   Bicarbonate 42.2 (H) 20.0 - 28.0 mmol/L   Acid-Base Excess 15.1 (H) 0.0 - 2.0 mmol/L   O2 Saturation 89.4 %   Patient temperature 98.6    Collection site RIGHT RADIAL    Drawn by [id number]    Allens test (pass/fail) PASS PASS    Comment: Performed at [facility], [address] [place], [place] [address]  Glucose, capillary     Status: Abnormal   Collection Time: 03/16/20  8:14 AM  Result Value Ref Range   Glucose-Capillary 139 (H) 70 - 99 mg/dL    Comment: Glucose reference range applies only to samples taken after fasting for at least 8 hours.   Comment 1 Notify RN   MRSA PCR Screening     Status: Abnormal   Collection Time: 03/16/20  6:30 PM   Specimen: Nasal Mucosa; Nasopharyngeal  Result Value Ref Range   MRSA by PCR POSITIVE (A) NEGATIVE    Comment:        The GeneXpert MRSA Assay (FDA approved for NASAL specimens only), is one component of a comprehensive MRSA colonization surveillance program. It is not intended to diagnose MRSA infection nor to guide or monitor treatment for MRSA infections. RESULT CALLED TO, READ BACK  BY AND VERIFIED WITH: NGUYEN,TILDA 03/16/20 @2144  BY SEEL,MOLLY Performed at [facility], [address], [place], [place]   Glucose, capillary     Status: Abnormal   Collection Time: 03/16/20 11:13 PM  Result Value Ref Range   Glucose-Capillary 134 (H) 70 - 99 mg/dL    Comment: Glucose reference range applies only to samples taken after fasting for at least 8 hours.  CBC with Differential/Platelet     Status: Abnormal   Collection Time: 03/17/20  5:13 AM  Result Value Ref Range   WBC 13.0 (H) 4.0 - 10.5 K/uL   RBC 2.82 (L) 3.87 - 5.11 MIL/uL   Hemoglobin 8.4 (L) 12.0 - 15.0 g/dL   HCT 05/15/20 (L) 13.0 - 86.5 %   MCV 99.6 80.0 - 100.0 fL   MCH 29.8 26.0 - 34.0 pg   MCHC 29.9 (L) 30.0 - 36.0 g/dL   RDW 78.4 (H) 69.6 - 29.5 %   Platelets 350 150 - 400 K/uL   nRBC 0.7 (H) 0.0 - 0.2 %   Neutrophils Relative % 79 %   Neutro Abs 10.1 (H) 1.7 - 7.7 K/uL   Lymphocytes Relative 14 %   Lymphs Abs 1.9 0.7 - 4.0 K/uL   Monocytes Relative 6 %   Monocytes Absolute 0.8 0.1 - 1.0 K/uL   Eosinophils Relative 0 %   Eosinophils Absolute 0.0 0.0 - 0.5 K/uL   Basophils Relative 0 %   Basophils Absolute 0.0 0.0 - 0.1 K/uL   Immature Granulocytes 1 %   Abs Immature Granulocytes 0.17 (H) 0.00 - 0.07 K/uL    Comment: Performed at [facility], [address] [place], [place]  Comprehensive metabolic panel     Status: Abnormal   Collection Time: 03/17/20  5:13 AM  Result Value Ref Range   Sodium 138 135 - 145 mmol/L   Potassium 4.3 3.5 -  5.1 mmol/L   Chloride 92 (L) 98 - 111 mmol/L   CO2 38 (H) 22 - 32 mmol/L   Glucose, Bld 133 (H) 70 - 99 mg/dL    Comment: Glucose reference range applies only to samples taken after fasting for at least 8 hours.   BUN 9 6 - 20 mg/dL   Creatinine, Ser 0.76 (L) 0.44 - 1.00 mg/dL   Calcium 8.7 (L) 8.9 - 10.3 mg/dL   Total Protein 5.9 (L) 6.5 - 8.1 g/dL   Albumin 2.8 (L) 3.5 - 5.0 g/dL   AST 20 15 - 41 U/L    ALT 51 (H) 0 - 44 U/L   Alkaline Phosphatase 71 38 - 126 U/L   Total Bilirubin 0.8 0.3 - 1.2 mg/dL   GFR, Estimated >80 >88 mL/min    Comment: (NOTE) Calculated using the CKD-EPI Creatinine Equation (2021)    Anion gap 8 5 - 15    Comment: Performed at [facility], [address] [place], [place] [address]  Magnesium     Status: None   Collection Time: 03/17/20  5:13 AM  Result Value Ref Range   Magnesium 2.3 1.7 - 2.4 mg/dL    Comment: Performed at [facility], [address], [place], [place] [address]  Glucose, capillary     Status: Abnormal   Collection Time: 03/17/20  8:27 AM  Result Value Ref Range   Glucose-Capillary 135 (H) 70 - 99 mg/dL    Comment: Glucose reference range applies only to samples taken after fasting for at least 8 hours.    Current Facility-Administered Medications  Medication Dose Route Frequency Provider Last Rate Last Admin  . 0.9 %  sodium chloride infusion (Manually program via Guardrails IV Fluids)   Intravenous Once Dow Adolph N, DO   Stopping Infusion hung by another clincian at 03/14/20 1730  . acetaminophen (TYLENOL) tablet 650 mg  650 mg Oral Q6H PRN Maurilio Lovely D, DO   650 mg at 03/16/20 1750  . albuterol (PROVENTIL) (2.5 MG/3ML) 0.083% nebulizer solution 2.5 mg  2.5 mg Nebulization Q4H PRN Noralee Stain, DO      . arformoterol (BROVANA) nebulizer solution 15 mcg  15 mcg Nebulization BID Karie Fetch P, DO   15 mcg at 03/17/20 5929  . ascorbic acid (VITAMIN C) tablet 500 mg  500 mg Oral Daily Bobette Mo, MD   500 mg at 03/16/20 1036  . benzonatate (TESSALON) capsule 200 mg  200 mg Oral BID Rolly Salter, MD   200 mg at 03/16/20 2128  . bisacodyl (DULCOLAX) suppository 10 mg  10 mg Rectal Once Darlin Drop, DO      . bisacodyl (DULCOLAX) suppository 10 mg  10 mg Rectal Daily PRN Dow Adolph N, DO      . budesonide (PULMICORT) nebulizer solution 0.5 mg  0.5 mg Nebulization BID Karie Fetch  P, DO   0.5 mg at 03/17/20 0834  . chlorhexidine (PERIDEX) 0.12 % solution 15 mL  15 mL Mouth Rinse BID Tyrone Nine, MD   15 mL at 03/16/20 2127  . Chlorhexidine Gluconate Cloth 2 % PADS 6 each  6 each Topical Daily Tyrone Nine, MD   6 each at 03/05/20 2125  . cholecalciferol (VITAMIN D3) tablet 1,000 Units  1,000 Units Oral Daily Dow Adolph N, DO   1,000 Units at 03/16/20 1035  . dicyclomine (BENTYL) capsule 10 mg  10 mg Oral TID PRN Noralee Stain, DO  10 mg at 03/09/20 1254  . feeding supplement (ENSURE ENLIVE / ENSURE PLUS) liquid 237 mL  237 mL Oral BID BM Hall, Carole N, DO   237 mL at 03/15/20 0915  . furosemide (LASIX) tablet 40 mg  40 mg Oral Once Dow Adolph N, DO      . guaiFENesin-codeine 100-10 MG/5ML solution 10 mL  10 mL Oral Q4H PRN Dow Adolph N, DO   10 mL at 03/17/20 0930  . hydrocortisone (ANUSOL-HC) 2.5 % rectal cream   Rectal BID PRN Noralee Stain, DO   1 application at 03/08/20 1509  . insulin aspart (novoLOG) injection 0-20 Units  0-20 Units Subcutaneous TID WC Bobette Mo, MD   3 Units at 03/17/20 [phone]  . insulin aspart (novoLOG) injection 0-5 Units  0-5 Units Subcutaneous QHS Noralee Stain, DO   4 Units at 03/15/20 2122  . insulin aspart (novoLOG) injection 3 Units  3 Units Subcutaneous TID WC Dow Adolph N, DO   3 Units at 03/17/20 3212  . insulin glargine (LANTUS) injection 4 Units  4 Units Subcutaneous BID Darlin Drop, DO   4 Units at 03/16/20 2324  . linezolid (ZYVOX) IVPB 600 mg  600 mg Intravenous Q12H Hall, Carole N, DO      . lip balm (CARMEX) ointment   Topical PRN Rolly Salter, MD   Given at 03/03/20 1022  . magic mouthwash  15 mL Oral TID PRN Tyrone Nine, MD   15 mL at 02/18/20 2201  . MEDLINE mouth rinse  15 mL Mouth Rinse q12n4p Tyrone Nine, MD   15 mL at 03/16/20 1230  . mirtazapine (REMERON) tablet 7.5 mg  7.5 mg Oral QHS Hall, Carole N, DO   7.5 mg at 03/16/20 2128  . multivitamin with minerals tablet 1 tablet  1 tablet Oral  Daily Noralee Stain, DO   1 tablet at 03/16/20 1035  . mupirocin ointment (BACTROBAN) 2 % 1 application  1 application Nasal BID Darlin Drop, DO   1 application at 03/16/20 2329  . polyethylene glycol (MIRALAX / GLYCOLAX) packet 17 g  17 g Oral BID Dow Adolph N, DO   17 g at 03/16/20 1034  . predniSONE (DELTASONE) tablet 30 mg  30 mg Oral Q breakfast Noralee Stain, DO   30 mg at 03/17/20 2482  . QUEtiapine (SEROQUEL) tablet 25 mg  25 mg Oral BID Dow Adolph N, DO   25 mg at 03/16/20 2128  . senna-docusate (Senokot-S) tablet 2 tablet  2 tablet Oral BID Dow Adolph N, DO   2 tablet at 03/16/20 2128  . sodium chloride (OCEAN) 0.65 % nasal spray 1 spray  1 spray Each Nare PRN Noralee Stain, DO   1 spray at 03/12/20 1254  . sodium chloride flush (NS) 0.9 % injection 10-40 mL  10-40 mL Intracatheter Q12H Hall, Carole N, DO   10 mL at 03/16/20 2130  . sodium chloride flush (NS) 0.9 % injection 10-40 mL  10-40 mL Intracatheter PRN Dow Adolph N, DO      . traZODone (DESYREL) tablet 50 mg  50 mg Oral QHS PRN Dow Adolph N, DO   50 mg at 03/16/20 2128  . zinc sulfate capsule 220 mg  220 mg Oral Daily Bobette Mo, MD   220 mg at 03/16/20 1035    Musculoskeletal: Strength & Muscle Tone: within normal limits Gait & Station: unsteady Patient leans: N/A  Psychiatric Specialty Exam: Physical Exam  Review of Systems   Blood pressure 94/60, pulse 84, temperature 97.6 F (36.4 C), temperature source Axillary, resp. rate 19, height 5\' 8"  (1.727 m), weight 98.9 kg, SpO2 99 %.Body mass index is 33.15 kg/m.  General Appearance: Casual  Eye Contact:  Fair  Speech:  Clear and Coherent and Slow  Volume:  Normal  Mood:  Anxious and Irritable  Affect:  Congruent and Tearful  Thought Process:  Coherent, Linear and Descriptions of Associations: Intact  Orientation:  Full (Time, Place, and Person)  Thought Content:  Logical and Tangential  Suicidal Thoughts:  No  Homicidal Thoughts:  No   Memory:  Immediate;   Fair Recent;   Fair Remote;   Fair  Judgement:  Intact  Insight:  Fair  Psychomotor Activity:  Normal  Concentration:  Concentration: Fair and Attention Span: Fair  Recall:  [employer] of Knowledge:  Fair  Language:  Fair  Akathisia:  No  Handed:  Right  AIMS (if indicated):     Assets:  Communication Skills Desire for Improvement Financial Resources/Insurance Housing Resilience Social Support Transportation  ADL's:  Intact  Cognition:  WNL  Sleep:       Christina Vincent Is a 60 year old female with history of type 2 diabetes, hypertension, hyperlipidemia, obesity, chronic depression and anxiety, and insomnia who presented with fever and dyspnea, found to have COVID-19.  Hospital Course complicated by severe hypoxia, persistent hypercarbia, worsening anxiety, and most recently patient was diagnosed with MRSA pneumonia on February 6.  At current she endorses worsening anxiety, however is unable to identify a new calls.  When assessing for additional factors she just reports feeling overwhelmed,'s mother, uncontrollable crying.  She endorses some feelings of anhedonia, and hopeless as it relates to ongoing medical conditions most recently bilateral pulmonary infiltrates, and MRSA pneumonia.  She currently rates her anxiety at a 9 out of 10 with 10 being the worst on Likert scale.  Psychiatric consult was placed for medical management of concomitant  anxiety, that is impacting her overall medical progression.  Patient is currently receiving multiple inhaled corticosteroids, as well as oral prednisone 30 mg p.o. daily.  Patient denies any current suicidal ideations, homicidal ideation, and or auditory visual hallucinations. She declined for this nurse practitioner to obtain collateral information from her daughter who was at the bedside.   Treatment Plan Summary: Plan Psych cleared. See below.   Anxiety: Cause of worsening anxiety remains unknown at this time, however  patient remains on multiple inhalers and oral corticosteroids, at high doses which research has shown can cause anxiety, mood disorders, insomnia, and psychosis.  Did discuss findings with internal medicine team Dr. Margo Aye who reports initiation of prednisone taper today.  -Patient previous benzodiazepine was discontinued by medical team, secondary to suspected obstructive sleep apnea and retaining CO2.  Will order gabapentin 100 mg p.o. twice daily, to further target anxiety. -Continue Seroquel 25 mg p.o. twice daily for treatment resistant anxiety. -Will increase mirtazapine 15 mg p.o. nightly. -Hallucinations have since resolved, will continue Seroquel 25 mg p.o. twice daily.  Last EKG obtained on February 3, QTC of 422.  -Insomnia-Patient reports an isolated incident of insomnia, likely secondary to discontinuing use of Klonopin.  Will increase mirtazapine 15 mg p.o. nightly, as patient has previously experience sedation with higher doses.  Will initiate gabapentin 100 mg p.o. twice daily to help target sleep and anxiety.  -Recommend working closely with social work to secure outpatient behavioral health appointment for management of anxiety and  depression. Patient [facility], as she is uninsured at this time.   -Recommend referral to hospice for support groups for caregivers and bereavement. Will place consult for chaplain.   -Did discuss treatment plan with Dr. Margo Aye via secure chat.  Will update AVS to reflect outpatient behavioral health referral, once patient is medically stable for discharge.  Disposition: No evidence of imminent risk to self or others at present.   Patient does not meet criteria for psychiatric inpatient admission. Supportive therapy provided about ongoing stressors. Refer to IOP. Discussed crisis plan, support from social network, calling 911, coming to the Emergency Department, and calling Suicide Hotline.  Maryagnes Amos, FNP 03/17/2020 12:02 PM

## 2020-03-18 LAB — CBC WITH DIFFERENTIAL/PLATELET
Abs Immature Granulocytes: 0.16 10*3/uL — ABNORMAL HIGH (ref 0.00–0.07)
Abs Immature Granulocytes: 0.18 10*3/uL — ABNORMAL HIGH (ref 0.00–0.07)
Basophils Absolute: 0 10*3/uL (ref 0.0–0.1)
Basophils Absolute: 0 10*3/uL (ref 0.0–0.1)
Basophils Relative: 0 %
Basophils Relative: 0 %
Eosinophils Absolute: 0.1 10*3/uL (ref 0.0–0.5)
Eosinophils Absolute: 0 10*3/uL (ref 0.0–0.5)
Eosinophils Relative: 0 %
Eosinophils Relative: 0 %
HCT: 29.9 % — ABNORMAL LOW (ref 36.0–46.0)
HCT: 30.6 % — ABNORMAL LOW (ref 36.0–46.0)
Hemoglobin: 9 g/dL — ABNORMAL LOW (ref 12.0–15.0)
Hemoglobin: 9.4 g/dL — ABNORMAL LOW (ref 12.0–15.0)
Immature Granulocytes: 1 %
Immature Granulocytes: 1 %
Lymphocytes Relative: 10 %
Lymphocytes Relative: 4 %
Lymphs Abs: 1.5 10*3/uL (ref 0.7–4.0)
Lymphs Abs: 0.6 10*3/uL — ABNORMAL LOW (ref 0.7–4.0)
MCH: 30.3 pg (ref 26.0–34.0)
MCH: 30.9 pg (ref 26.0–34.0)
MCHC: 30.1 g/dL (ref 30.0–36.0)
MCHC: 30.7 g/dL (ref 30.0–36.0)
MCV: 100.7 fL — ABNORMAL HIGH (ref 80.0–100.0)
MCV: 100.7 fL — ABNORMAL HIGH (ref 80.0–100.0)
Monocytes Absolute: 0.8 10*3/uL (ref 0.1–1.0)
Monocytes Absolute: 0.4 10*3/uL (ref 0.1–1.0)
Monocytes Relative: 5 %
Monocytes Relative: 2 %
Neutro Abs: 11.8 10*3/uL — ABNORMAL HIGH (ref 1.7–7.7)
Neutro Abs: 15.5 10*3/uL — ABNORMAL HIGH (ref 1.7–7.7)
Neutrophils Relative %: 84 %
Neutrophils Relative %: 93 %
Platelets: 384 10*3/uL (ref 150–400)
Platelets: 406 10*3/uL — ABNORMAL HIGH (ref 150–400)
RBC: 2.97 MIL/uL — ABNORMAL LOW (ref 3.87–5.11)
RBC: 3.04 MIL/uL — ABNORMAL LOW (ref 3.87–5.11)
RDW: 18.6 % — ABNORMAL HIGH (ref 11.5–15.5)
RDW: 18.6 % — ABNORMAL HIGH (ref 11.5–15.5)
WBC: 14.2 10*3/uL — ABNORMAL HIGH (ref 4.0–10.5)
WBC: 16.7 10*3/uL — ABNORMAL HIGH (ref 4.0–10.5)
nRBC: 0.6 % — ABNORMAL HIGH (ref 0.0–0.2)
nRBC: 0.4 % — ABNORMAL HIGH (ref 0.0–0.2)

## 2020-03-18 LAB — PHOSPHORUS: Phosphorus: 3.3 mg/dL (ref 2.5–4.6)

## 2020-03-18 LAB — COMPREHENSIVE METABOLIC PANEL
ALT: 41 U/L (ref 0–44)
AST: 18 U/L (ref 15–41)
Albumin: 2.7 g/dL — ABNORMAL LOW (ref 3.5–5.0)
Alkaline Phosphatase: 67 U/L (ref 38–126)
Anion gap: 9 (ref 5–15)
BUN: 6 mg/dL (ref 6–20)
CO2: 33 mmol/L — ABNORMAL HIGH (ref 22–32)
Calcium: 8.1 mg/dL — ABNORMAL LOW (ref 8.9–10.3)
Chloride: 97 mmol/L — ABNORMAL LOW (ref 98–111)
Creatinine, Ser: 0.35 mg/dL — ABNORMAL LOW (ref 0.44–1.00)
GFR, Estimated: >60 mL/min (ref >60)
Glucose, Bld: 145 mg/dL — ABNORMAL HIGH (ref 70–99)
Potassium: 3.8 mmol/L (ref 3.5–5.1)
Sodium: 139 mmol/L (ref 135–145)
Total Bilirubin: 0.7 mg/dL (ref 0.3–1.2)
Total Protein: 5.5 g/dL — ABNORMAL LOW (ref 6.5–8.1)

## 2020-03-18 LAB — PROCALCITONIN: Procalcitonin: <0.1 ng/mL

## 2020-03-18 LAB — GLUCOSE, CAPILLARY
Glucose-Capillary: 279 mg/dL — ABNORMAL HIGH (ref 70–99)
Glucose-Capillary: 215 mg/dL — ABNORMAL HIGH (ref 70–99)
Glucose-Capillary: 162 mg/dL — ABNORMAL HIGH (ref 70–99)
Glucose-Capillary: 233 mg/dL — ABNORMAL HIGH (ref 70–99)
Glucose-Capillary: 106 mg/dL — ABNORMAL HIGH (ref 70–99)
Glucose-Capillary: 191 mg/dL — ABNORMAL HIGH (ref 70–99)
Glucose-Capillary: 270 mg/dL — ABNORMAL HIGH (ref 70–99)
Glucose-Capillary: 118 mg/dL — ABNORMAL HIGH (ref 70–99)

## 2020-03-18 LAB — MAGNESIUM: Magnesium: 1.9 mg/dL (ref 1.7–2.4)

## 2020-03-18 MED ORDER — GUAIFENESIN-CODEINE 100-10 MG/5ML PO SOLN
10.0000 mL | Freq: Four times a day (QID) | ORAL | Status: AC
  Administered 2020-03-18 – 2020-03-20 (×7): 10 mL via ORAL
  Filled 2020-03-18 (×7): qty 10

## 2020-03-18 MED ORDER — VANCOMYCIN HCL 2000 MG/400ML IV SOLN
2000.0000 mg | Freq: Once | INTRAVENOUS | Status: AC
  Administered 2020-03-18: 2000 mg via INTRAVENOUS
  Filled 2020-03-18: qty 400

## 2020-03-18 MED ORDER — VANCOMYCIN HCL 750 MG/150ML IV SOLN
750.0000 mg | Freq: Two times a day (BID) | INTRAVENOUS | Status: DC
  Administered 2020-03-19 – 2020-03-20 (×3): 750 mg via INTRAVENOUS
  Filled 2020-03-18 (×3): qty 150

## 2020-03-18 MED ORDER — PREDNISONE 5 MG PO TABS
5.0000 mg | ORAL_TABLET | Freq: Every day | ORAL | Status: DC
  Administered 2020-03-21: 5 mg via ORAL
  Filled 2020-03-18 (×2): qty 1

## 2020-03-18 MED ORDER — PREDNISONE 10 MG PO TABS
10.0000 mg | ORAL_TABLET | Freq: Every day | ORAL | Status: AC
  Administered 2020-03-19 – 2020-03-20 (×2): 10 mg via ORAL
  Filled 2020-03-18 (×2): qty 1

## 2020-03-18 NOTE — Progress Notes (Signed)
PROGRESS NOTE  Christina Vincent FAO:[id number] DOB: 11/20/1960 DOA: 02/04/2020 PCP: Christina Vincent  HPI/Recap of past 24 hours: Christina D Siddleis a 60 y.o.femalewith a history of T2DM, HTN, HLD, obesity, chronic depression/anxiety, chronic insomnia who presented with fever and dyspnea found to have positive SARS-CoV-2 PCR and mild hypoxia despite clear CXR on 02/04/20.  She was found to have acute bilateral pulmonary emboli without right heart strain.  CXR repeated 1/13, 1/18 and 1/20 significant heterogenous opacities bilaterally. Psychiatry was consulted for medical management of concomitant anxiety.  PCCM was consulted for further recommendations on 02/28/2020. On 1/23 she became lethargic and was noted to be severely hypercarbic PCO2 122 with PH 7.222, placed on BiPAP and transferred to ICU.  Hospital course complicated by severe hypoxia and persistent hypercarbia despite BiPAP at night.  Initially on nonrebreather and high flow nasal cannula in order to maintain O2 saturation greater than 90%.  Currently weaning off oxygen supplementation, now on 3L with O2 saturation 100%.  Repeated chest x-ray on 03/13/2020, personally reviewed revealed worsening left sided pulmonary infiltrates.  Started on Unasyn for suspected superimposed pulmonary bacterial infection.  Sputum culture obtained on the same day returned positive for MRSA.  Antibiotic was switched to Linezolid as the patient had lost her IV access.  Blood cultures x2 obtained 03/16/20.  They are negative to date, continue to follow.  Drop of hemoglobin acutely from 10.3K down to lowest at 7.2K on 03/14/2020, transfused 1 unit PRBC on 03/14/20.  CT abdomen and pelvis revealed hematoma in b/l obturator muscles, confirmed with MRI pelvis with and without contrast on 03/15/20, discussed with IR Dr. Grace IsaacWatts who reviewed CTA PE and noted B/L PEs were small.  Risks of continuing anticoagulation outweigh benefits, full dose subcu Lovenox twice daily was  discontinued on 03/15/20.  On 03/16/20, she reversed her CODE STATUS to full code.  This was witnessed by her bedside RN Haynes DagePickett, [place].  She reported worsening anxiety on 2/7 for which psychiatry was re-consulted and provided further recommendations.  03/18/20: Seen and examined at her bedside.  She feels better today and slept better.  She is currently being weaned off oxygen supplementation and tolerating well.  Tapering off prednisone.  Tea color coca cola colored urine noted today.  Will be closely monitored with hx of hematuria in 2010 per medical records.  Assessment/Plan: Principal Problem:   Acute hypoxemic respiratory failure due to COVID-19 [facility](HCC) Active Problems:   2019 novel coronavirus disease (COVID-19)   Hypertension   Type 2 diabetes mellitus (HCC)   Dyslipidemia   Depression   Class 1 obesity   Pulmonary emboli (HCC)  Improving severe acute hypoxic hypercarbic respiratory failure, multifactorial secondary to COVID-19 viral pneumonia, superimposed by MRSA pneumonia, possibly contributed by recently diagnosed bilateral pulmonary emboli (02/12/20).  Not on oxygen supplementation at baseline. Recently on high flow nasal cannula plus nonrebreather to maintain O2 saturation greater than 92% Currently on 3L with O2 sat 98% Suspect undiagnosed OSA, will likely need polysomnography outpatient and pulmonary referral. PCO2 65 PH 7.427 on 03/16/20 Continue BiPAP at night  TOC consulted to assist with Trilogy/NIV for DC planning. Continue current management. Continue incentive spirometer and flutter valve. Continue to wean off oxygen supplementation as tolerated. Continue bronchodilators Continue side sleeping or prone sleeping as tolerated Continue out of bed to chair as tolerated Continue to mobilize as tolerated Continue PT OT with assistance and fall precautions.  Uncontrolled anxiety Psychiatry reconsulted on 03/17/2020 Appreciate assistance. Continue to avoid benzodiazepines  due to suspected undiagnosed OSA. Follow psych recommendations  Chronic insomnia Follow psych recommendations Appreciate psychiatrist assistance.  Sepsis secondary to newly diagnosed MRSA pneumonia WBC 12.5K, heart rate 112, respiratory rate 36. Sputum culture obtained on 03/13/20 + MRSA.  Received 3 days linezolid Switched to IV vancomycin on 03/18/20 due to possibility for interaction with her psych meds. WBC is up-trending, repeating CBC with diff and procalcitonin this afternoon Continue to monitor WBC and fever curve  Coca cola colored urine with prior hx of hematuria in 2010 Per medical records lasted 1 year but CTAP unrevealing Hg is up-trending 9.0 from 8.4 Renal function is stable Cr 0.35 If persists consider urology consultation  R renal cyst on CT AP Measuring 18 mm Monitor  Acute blood loss anemia secondary to moderate to large bilateral obturator muscle hematoma, seen on CT AP (03/14/20) and MRI pelvis (03/15/20) Post 1 unit PRBC transfusion on 03/14/2020 for hemoglobin of 7.2. Full dose Lovenox twice daily DC'd on 03/15/2020 Hemoglobin up-trending 9.2K from 8.5K. Continue to monitor H&H  Suspected undiagnosed OSA and OHS Suspect undiagnosed OSA, will likely need polysomnography outpatient and pulmonary referral. PCO2 65 PH 7.427 on 03/16/20 Continue BiPAP at night  Will need trilogy or NIV at discharge.  Intractable cough in the setting of bilateral PE Continue guaifenesin/codeine syrup 32mL QID x 2 days  COVID-19 viral pneumonia, superimposed by MRSA pneumonia -Tested positive on 02/04/2020.  Off isolation -Completed full course of Remdesivir and baricitinib -Received three full days of Unasyn. -Received 3 days of linezolid for MRSA pneumonia switched to IV vancomycin on 2/8.. -Continue to encourage IS, encourage prone positioning, encourage mobilization as tolerated Continue Pulmicort, Brovana, albuterol Wean off prednisone, tapering off. Inflammatory markers for  Covid are improving  Acute bilateral PE, no right heart strain on CT scan Discontinued full dose subcu Lovenox twice daily on 03/15/2020 due to intramuscular hematoma. Bilateral Doppler ultrasound negative for DVT on 02/12/2020.  Resolved constipation Continue bowel regimen: Senokot 2 tablet twice daily, MiraLAX daily, Dulcolax suppository as needed  Diabetes mellitus type 2 with hyperglycemia CBG stable and well-controlled. Continue Lantus 40 units twice daily and NovoLog 3 units 3 times daily. Continue SSI scale. Hemoglobin A1c 7.7 on 02/14/2020  Chronic anxiety/depression/insomnia  Avoid benzodiazepines Follow psych recs Last QTC 422 on 03/13/2020.  Obesity -Estimated body mass index is 33.15 kg/m as calculated from the following:   Height as of this encounter: 5\' 8"  (1.727 m).   Weight as of this encounter: 98.9 kg. When stable outpatient regular physical activity and healthy dieting.  Abnormal TSH, subclinical hyperthyroidism Normal Free T4, TSH 0.308. -Repeat as outpatient in 4 to 6 weeks when acute illness has resolved  Generalized weakness/ambulatory dysfunction PT assessment recommended home health PT OT. Continue out of bed to chair as tolerated Continue to mobilize as tolerated Continue fall precautions   DVT prophylaxis: SCDs.   Code Status: DNR Family Communicati updated husband via phone on 03/17/2020.    Status is: Inpatient    Dispo:  Patient From: Home  Planned Disposition: Home with Health Care Svc  Expected discharge date: 03/22/2020  Medically stable for discharge: No, ongoing management of COVID-19 viral pneumonia, MRSA pneumonia, acute hypoxic hypercarbic respiratory failure.         Objective: Vitals:   03/18/20 0800 03/18/20 0830 03/18/20 1100 03/18/20 1200  BP:   119/77   Pulse: 94  (!) 111 (!) 107  Resp: (!) 33  (!) 26 (!) 30  Temp:   98.5 F (  36.9 C)   TempSrc:   Oral   SpO2: 96% 100% 92% 93%  Weight:      Height:         Intake/Output Summary (Last 24 hours) at 03/18/2020 1612 Last data filed at 03/18/2020 1300 Gross per 24 hour  Intake 1020 ml  Output --  Net 1020 ml   Filed Weights   02/04/20 1838  Weight: 98.9 kg    Exam:  . General: 60 y.o. year-old female pleasant well-developed well-nourished in no acute distress.  Alert oriented x3.   . Cardiovascular: Regular rate and rhythm no rubs or gallops.   Marland Kitchen Respiratory: Mild rales at bases no wheezing noted.   . Abdomen: Obese nontender normal bowel sounds present.  . Musculoskeletal: No lower extremity edema bilaterally.   Marland Kitchen Psychiatry: Mood is appropriate for the same setting..  Data Reviewed: CBC: Recent Labs  Lab 03/14/20 0747 03/14/20 1357 03/15/20 0105 03/15/20 9485 03/15/20 1912 03/16/20 0516 03/17/20 0513 03/18/20 0814  WBC 15.8*  --  15.7*  --   --  12.5* 13.0* 14.2*  NEUTROABS 12.0*  --  12.0*  --   --  9.8* 10.1* 11.8*  HGB 7.5*   < > 8.2* 9.0* 8.6* 8.5* 8.4* 9.0*  HCT 25.3*   < > 26.4* 29.1* 28.1* 28.7* 28.1* 29.9*  MCV 99.2  --  98.5  --   --  100.3* 99.6 100.7*  PLT 334  --  285  --   --  331 350 384   < > = values in this interval not displayed.   Basic Metabolic Panel: Recent Labs  Lab 03/13/20 0434 03/14/20 0747 03/15/20 0105 03/17/20 0513 03/18/20 0814  NA 137 138 138 138 139  K 4.2 4.0 4.4 4.3 3.8  CL 88* 88* 89* 92* 97*  CO2 41* 38* 39* 38* 33*  GLUCOSE 162* 177* 146* 133* 145*  BUN 11 13 10 9 6   CREATININE 0.50 0.41* 0.47 0.37* 0.35*  CALCIUM 8.8* 8.9 8.9 8.7* 8.1*  MG 2.1  --  2.1 2.3 1.9  PHOS 3.8  --  4.3  --  3.3   GFR: Estimated Creatinine Clearance: 95.4 mL/min (A) (by C-G formula based on SCr of 0.35 mg/dL (L)). Liver Function Tests: Recent Labs  Lab 03/15/20 0105 03/17/20 0513 03/18/20 0814  AST 32 20 18  ALT 67* 51* 41  ALKPHOS 66 71 67  BILITOT 0.7 0.8 0.7  PROT 5.9* 5.9* 5.5*  ALBUMIN 2.8* 2.8* 2.7*   No results for input(s): LIPASE, AMYLASE in the last 168 hours. No results  for input(s): AMMONIA in the last 168 hours. Coagulation Profile: No results for input(s): INR, PROTIME in the last 168 hours. Cardiac Enzymes: No results for input(s): CKTOTAL, CKMB, CKMBINDEX, TROPONINI in the last 168 hours. BNP (last 3 results) No results for input(s): PROBNP in the last 8760 hours. HbA1C: No results for input(s): HGBA1C in the last 72 hours. CBG: Recent Labs  Lab 03/17/20 1656 03/17/20 2054 03/17/20 2129 03/18/20 0819 03/18/20 1114  GLUCAP 282* 65* 141* 106* 191*   Lipid Profile: No results for input(s): CHOL, HDL, LDLCALC, TRIG, CHOLHDL, LDLDIRECT in the last 72 hours. Thyroid Function Tests: No results for input(s): TSH, T4TOTAL, FREET4, T3FREE, THYROIDAB in the last 72 hours. Anemia Panel: No results for input(s): VITAMINB12, FOLATE, FERRITIN, TIBC, IRON, RETICCTPCT in the last 72 hours. Urine analysis: No results found for: COLORURINE, APPEARANCEUR, LABSPEC, PHURINE, GLUCOSEU, HGBUR, BILIRUBINUR, KETONESUR, PROTEINUR, UROBILINOGEN, NITRITE, LEUKOCYTESUR Sepsis Labs: @  LABRCNTIP(procalcitonin:4,lacticidven:4)  ) Recent Results (from the past 240 hour(s))  Expectorated sputum assessment w rflx to resp cult     Status: None   Collection Time: 03/13/20  4:42 PM   Specimen: Expectorated Sputum  Result Value Ref Range Status   Specimen Description EXPECTORATED SPUTUM  Final   Special Requests NONE  Final   Sputum evaluation   Final    THIS SPECIMEN IS ACCEPTABLE FOR SPUTUM CULTURE Performed at [facility], [address], [place], [place] [address]    Report Status 03/13/2020 FINAL  Final  Culture, respiratory     Status: None   Collection Time: 03/13/20  4:42 PM  Result Value Ref Range Status   Specimen Description   Final    EXPECTORATED SPUTUM Performed at [facility], [address], [place], [place] [address]    Special Requests   Final    NONE Reflexed from [id number] Performed at [facility], [address] [place], [place] [address]    Gram Stain   Final    FEW WBC PRESENT, PREDOMINANTLY PMN MODERATE GRAM POSITIVE COCCI IN PAIRS IN CLUSTERS MODERATE GRAM POSITIVE RODS Performed at [facility], [address], [place], [place] [address]    Culture   Final    ABUNDANT METHICILLIN RESISTANT STAPHYLOCOCCUS AUREUS   Report Status 03/16/2020 FINAL  Final   Organism ID, Bacteria METHICILLIN RESISTANT STAPHYLOCOCCUS AUREUS  Final      Susceptibility   Methicillin resistant staphylococcus aureus - MIC*    CIPROFLOXACIN >=8 RESISTANT Resistant     ERYTHROMYCIN >=8 RESISTANT Resistant     GENTAMICIN <=0.5 SENSITIVE Sensitive     OXACILLIN >=4 RESISTANT Resistant     TETRACYCLINE <=1 SENSITIVE Sensitive     VANCOMYCIN 1 SENSITIVE Sensitive     TRIMETH/SULFA <=10 SENSITIVE Sensitive     CLINDAMYCIN <=0.25 SENSITIVE Sensitive     RIFAMPIN <=0.5 SENSITIVE Sensitive     Inducible Clindamycin NEGATIVE Sensitive     * ABUNDANT METHICILLIN RESISTANT STAPHYLOCOCCUS AUREUS  Culture, blood (routine x 2)     Status: None (Preliminary result)   Collection Time: 03/16/20  6:16 PM   Specimen: BLOOD  Result Value Ref Range Status   Specimen Description   Final    BLOOD RIGHT ANTECUBITAL Performed at [facility], [address] [place], [place] [address]    Special Requests   Final    BOTTLES DRAWN AEROBIC ONLY Blood Culture adequate volume Performed at [facility], [address], [place], [place] [address]    Culture   Final    NO GROWTH 1 DAY Performed at [facility], [address], [place], [place] [address]    Report Status PENDING  Incomplete  Culture, blood (routine x 2)     Status: None (Preliminary result)   Collection Time: 03/16/20  6:16 PM   Specimen: BLOOD  Result Value Ref Range Status   Specimen Description   Final    BLOOD RIGHT WRIST Performed at [facility], [address]  [place], [place] [address]    Special Requests   Final    BOTTLES DRAWN AEROBIC ONLY Blood Culture adequate volume Performed at [facility], [address], [place], [place] [address]    Culture   Final    NO GROWTH 1 DAY Performed at [facility], [address] [place], [place] [address]    Report  Status PENDING  Incomplete  MRSA PCR Screening     Status: Abnormal   Collection Time: 03/16/20  6:30 PM   Specimen: Nasal Mucosa; Nasopharyngeal  Result Value Ref Range Status   MRSA by PCR POSITIVE (A) NEGATIVE Final    Comment:        The GeneXpert MRSA Assay (FDA approved for NASAL specimens only), is one component of a comprehensive MRSA colonization surveillance program. It is not intended to diagnose MRSA infection nor to guide or monitor treatment for MRSA infections. RESULT CALLED TO, READ BACK BY AND VERIFIED WITH: NGUYEN,TILDA 03/16/20 @2144  BY SEEL,MOLLY Performed at [facility], [address] [place], [place] [address]       Studies: No results found.  Scheduled Meds: . arformoterol  15 mcg Nebulization BID  . vitamin C  500 mg Oral Daily  . budesonide  0.5 mg Nebulization BID  . chlorhexidine  15 mL Mouth Rinse BID  . Chlorhexidine Gluconate Cloth  6 each Topical Daily  . cholecalciferol  1,000 Units Oral Daily  . feeding supplement  237 mL Oral BID BM  . gabapentin  100 mg Oral BID  . guaiFENesin-codeine  10 mL Oral QID  . insulin aspart  0-20 Units Subcutaneous TID WC  . insulin aspart  0-5 Units Subcutaneous QHS  . insulin aspart  3 Units Subcutaneous TID WC  . insulin glargine  4 Units Subcutaneous BID  . mouth rinse  15 mL Mouth Rinse q12n4p  . mirtazapine  15 mg Oral QHS  . multivitamin with minerals  1 tablet Oral Daily  . mupirocin ointment  1 application Nasal BID  . polyethylene glycol  17 g Oral BID  . [START ON 03/19/2020] predniSONE  10 mg Oral Q breakfast  . [START ON 03/21/2020] predniSONE  5 mg  Oral Q breakfast  . QUEtiapine  25 mg Oral BID  . senna-docusate  2 tablet Oral BID  . sodium chloride flush  10-40 mL Intracatheter Q12H  . zinc sulfate  220 mg Oral Daily    Continuous Infusions: . linezolid (ZYVOX) IV 600 mg (03/18/20 1150)  . vancomycin       LOS: 42 days     Darlin Drop, [facility] Pager [phone]  If 7PM-7AM, please contact night-coverage [email or web address] Password [facility] 03/18/2020, 4:12 PM

## 2020-03-18 NOTE — Progress Notes (Signed)
Pharmacy Antibiotic Note  Christina Vincent is a 60 y.o. female admitted on 02/04/2020 with pneumonia.  Pharmacy has been consulted for vancomycin dosing. To transition from Zyvox to vancomycin for MRSA PNA.   WBC 14.2, SCr 0.35; AF.   Plan: Vancomycin 2000 mg IV loading dose Vancomycin 750 mg IV q24 for est AUC 437 Css max/min 26.8/12.5 SCr 0.8, Vd 0.5 F/u renal fxn, WBC, temp F/u length of therapy Vancomycin levels as needed  Height: 5\' 8"  (172.7 cm) Weight: 98.9 kg (218 lb) IBW/kg (Calculated) : 63.9  Temp (24hrs), Avg:98.5 F (36.9 C), Min:98.4 F (36.9 C), Max:98.7 F (37.1 C)  Recent Labs  Lab 03/13/20 0434 03/14/20 0747 03/15/20 0105 03/16/20 0516 03/17/20 0513 03/18/20 0814  WBC 19.6* 15.8* 15.7* 12.5* 13.0* 14.2*  CREATININE 0.50 0.41* 0.47  --  0.37* 0.35*    Estimated Creatinine Clearance: 95.4 mL/min (A) (by C-G formula based on SCr of 0.35 mg/dL (L)).    No Known Allergies Antimicrobials this admission:  Remdesivir 12/27>>12/31 Baricitinib 12/29 >> 1/11 2/3 unasyn>> 2/6 2/6 Linezolid>>2/8 2/8 vanc>> Dose adjustments this admission:   Microbiology results:  2/6 MRSA PCR: pos 2/6 BCx: ngtd 2/3 SputumCx: abundant MRSA. Vanc MIC 1 F  Thank you for allowing pharmacy to be a part of this patient's care.  3/11, Pharm.D 03/18/2020 4:12 PM

## 2020-03-18 NOTE — Plan of Care (Signed)
  Problem: Clinical Measurements: Goal: Diagnostic test results will improve Outcome: Progressing Goal: Respiratory complications will improve Outcome: Progressing   

## 2020-03-18 NOTE — Progress Notes (Signed)
Emptied pt urine graduate 600 cc of dark amber brown urine. Pt continue to take in oral fluids Will update MD and cont to monitor. SRP, RN

## 2020-03-18 NOTE — Progress Notes (Signed)
Physical Therapy Treatment Patient Details Name: Christina Vincent MRN: [id number] DOB: [birth year] Today's Date: 03/18/2020    History of Present Illness Pt is 60 y.o. female with medical history significant of depression, dyslipidemia, type 2 diabetes, class I obesity, hypertension who is coming to the emergency department due to progressively worse shortness of breath for the past 6 days associated with fever, chills, fatigue, malaise, sore throat, occasionally productive of whitish/clear dry cough and rhinorrhea.  Patient is unvaccinated and has been admitted for acute hypoxemic respiratory failure secondary to COVID-19.  Pt also found to have PE 02/12/20. Patient became lethargic 1/23 and transferred to ICU - almost intubated but family opted for Do Not Intubate and bipap was used instead. Patient currently in progressive care on 3 L at rest    PT Comments    General bed mobility comments: Pt in bed on 3 lts at rest 94% with HR 92.  pt self able with increased time and use of rail with HOB already elevated.  Required an extended seated rest break to decrease her anxiety as her RR increased to mid 30's and HR increased to 118 with sats decreased to low 80's on 3 Lts.  Increased nasal to 5 lts to achieve sats > 88%.  Pt sat EOB x 13 min before she was able to mentally initiate transfer to recliner. General transfer comment: needs cues for hand placement and increased time seated EOB to mentally prepare herself to perfom task.  Pt very fearful any movement/activity will cause her to hyper ventilate and get short of breath that she can not breath.  HR increased to 122 and sats decreased to 80% with RR 38 on 5 lts.  Required 6 min rest break and redirection on slow deep breaths for her to recover mentally and physically.  Positioned in recliner to comfort.  "I feel better" stated pt. General Gait Details: transfer only due to pt's mental status.  VERY anxious/fearful that she won't be able to breath during and  after activity.  Very similiar to PTSD.   Follow Up Recommendations  Home health PT;Supervision/Assistance - 24 hour     Equipment Recommendations  Rolling walker with 5" wheels;3in1 (PT)    Recommendations for Other Services       Precautions / Restrictions Precautions Precautions: Fall Precaution Comments: monitor sats, anxious and emotional with activity, Increase to 15 L HFNC for activity Restrictions Weight Bearing Restrictions: No    Mobility  Bed Mobility Overal bed mobility: Needs Assistance Bed Mobility: Supine to Sit     Supine to sit: Supervision;Min guard     General bed mobility comments: Pt in bed on 3 lts at rest 94% with HR 92.  pt self able with increased time and use of rail with HOB already elevated.  Required an extended seated rest break to decrease her anxiety as her RR increased to mid 30's and HR increased to 118 with sats decreased to low 80's on 3 Lts.  Increased nasal to 5 lts to achieve sats > 88%.  Pt sat EOB x 13 min before she was able to mentally initiate transfer to recliner.  Transfers Overall transfer level: Needs assistance Equipment used: Rolling walker (2 wheeled) Transfers: Sit to/from [employer] Sit to Stand: Supervision;Min guard Stand pivot transfers: Supervision;Min guard       General transfer comment: needs cues for hand placement and increased time seated EOB to mentally prepare herself to perfom task.  Pt very fearful any movement/activity will  cause her to hyper ventilate and get short of breath that she can not breath.  HR increased to 122 and sats decreased to 80% with RR 38 on 5 lts.  Required 6 min rest break and redirection on slow deep breaths for her to recover mentally and physically.  Positioned in recliner to comfort.  "I feel better" stated pt.  Ambulation/Gait             General Gait Details: transfer only due to pt's mental status.  VERY anxious/fearful that she won't be able to breath  during and after activity.  Very similiar to PTSD.   Stairs             Wheelchair Mobility    Modified Rankin (Stroke Patients Only)       Balance                                            Cognition Arousal/Alertness: Awake/alert Behavior During Therapy: Anxious Overall Cognitive Status: Impaired/Different from baseline                                 General Comments: AxO x 3 VERY nervous/anxious/scared "it feels like someone is choking me" Pt required increased time and alot of TLC to control anxiety      Exercises      General Comments        Pertinent Vitals/Pain Pain Assessment: No/denies pain    Home Living                      Prior Function            PT Goals (current goals can now be found in the care plan section) Progress towards PT goals: Progressing toward goals    Frequency    Min 3X/week      PT Plan Current plan remains appropriate    Co-evaluation              AM-PAC PT "6 Clicks" Mobility   Outcome Measure  Help needed turning from your back to your side while in a flat bed without using bedrails?: A Little Help needed moving from lying on your back to sitting on the side of a flat bed without using bedrails?: A Little Help needed moving to and from a bed to a chair (including a wheelchair)?: A Little Help needed standing up from a chair using your arms (e.g., wheelchair or bedside chair)?: A Little Help needed to walk in hospital room?: A Little Help needed climbing 3-5 steps with a railing? : A Lot 6 Click Score: 17    End of Session Equipment Utilized During Treatment: Oxygen Activity Tolerance: Patient limited by fatigue;Other (comment) (anxiety) Patient left: in chair;with call bell/phone within reach Nurse Communication: Mobility status PT Visit Diagnosis: Other abnormalities of gait and mobility (R26.89)     Time: 5284-1324 PT Time Calculation (min) (ACUTE  ONLY): 34 min  Charges:  $Therapeutic Activity: 23-37 mins                     Felecia Shelling  PTA Acute  Rehabilitation Services Pager      [phone] Office      [phone]

## 2020-03-18 NOTE — Progress Notes (Signed)
Pt has been YELLOW- RED-YELLOW-GREEN MEWS. VS q 4hrs. Throughout the day. Pt alert and talking without distress. HR and RESP rate increase with activity then settles down. Pt currently on 3 liters [place] with O2 sat 98-99%. Will report off. Pt in chair and requested to stay in the chair until later. SRP, RN

## 2020-03-19 LAB — CBC WITH DIFFERENTIAL/PLATELET
Abs Immature Granulocytes: 0.08 10*3/uL — ABNORMAL HIGH (ref 0.00–0.07)
Basophils Absolute: 0 10*3/uL (ref 0.0–0.1)
Basophils Relative: 0 %
Eosinophils Absolute: 0.1 10*3/uL (ref 0.0–0.5)
Eosinophils Relative: 1 %
HCT: 29 % — ABNORMAL LOW (ref 36.0–46.0)
Hemoglobin: 8.8 g/dL — ABNORMAL LOW (ref 12.0–15.0)
Immature Granulocytes: 1 %
Lymphocytes Relative: 15 %
Lymphs Abs: 1.8 10*3/uL (ref 0.7–4.0)
MCH: 30.4 pg (ref 26.0–34.0)
MCHC: 30.3 g/dL (ref 30.0–36.0)
MCV: 100.3 fL — ABNORMAL HIGH (ref 80.0–100.0)
Monocytes Absolute: 0.7 10*3/uL (ref 0.1–1.0)
Monocytes Relative: 5 %
Neutro Abs: 10 10*3/uL — ABNORMAL HIGH (ref 1.7–7.7)
Neutrophils Relative %: 78 %
Platelets: 386 10*3/uL (ref 150–400)
RBC: 2.89 MIL/uL — ABNORMAL LOW (ref 3.87–5.11)
RDW: 18.6 % — ABNORMAL HIGH (ref 11.5–15.5)
WBC: 12.6 10*3/uL — ABNORMAL HIGH (ref 4.0–10.5)
nRBC: 0.2 % (ref 0.0–0.2)

## 2020-03-19 LAB — COMPREHENSIVE METABOLIC PANEL
ALT: 39 U/L (ref 0–44)
AST: 19 U/L (ref 15–41)
Albumin: 3 g/dL — ABNORMAL LOW (ref 3.5–5.0)
Alkaline Phosphatase: 72 U/L (ref 38–126)
Anion gap: 8 (ref 5–15)
BUN: 6 mg/dL (ref 6–20)
CO2: 35 mmol/L — ABNORMAL HIGH (ref 22–32)
Calcium: 8.8 mg/dL — ABNORMAL LOW (ref 8.9–10.3)
Chloride: 97 mmol/L — ABNORMAL LOW (ref 98–111)
Creatinine, Ser: <0.3 mg/dL — ABNORMAL LOW (ref 0.44–1.00)
Glucose, Bld: 120 mg/dL — ABNORMAL HIGH (ref 70–99)
Potassium: 4 mmol/L (ref 3.5–5.1)
Sodium: 140 mmol/L (ref 135–145)
Total Bilirubin: 1 mg/dL (ref 0.3–1.2)
Total Protein: 6 g/dL — ABNORMAL LOW (ref 6.5–8.1)

## 2020-03-19 LAB — GLUCOSE, CAPILLARY
Glucose-Capillary: 134 mg/dL — ABNORMAL HIGH (ref 70–99)
Glucose-Capillary: 174 mg/dL — ABNORMAL HIGH (ref 70–99)
Glucose-Capillary: 231 mg/dL — ABNORMAL HIGH (ref 70–99)
Glucose-Capillary: 128 mg/dL — ABNORMAL HIGH (ref 70–99)

## 2020-03-19 LAB — MAGNESIUM: Magnesium: 2.2 mg/dL (ref 1.7–2.4)

## 2020-03-19 LAB — PHOSPHORUS: Phosphorus: 4.2 mg/dL (ref 2.5–4.6)

## 2020-03-19 MED ORDER — POLYETHYLENE GLYCOL 3350 17 G PO PACK: 17.0000 g | PACK | Freq: Every day | ORAL | Status: DC | PRN

## 2020-03-19 MED ORDER — INSULIN ASPART 100 UNIT/ML ~~LOC~~ SOLN: 0.0000 [IU] | Freq: Every day | SUBCUTANEOUS | Status: DC

## 2020-03-19 MED ORDER — INSULIN ASPART 100 UNIT/ML ~~LOC~~ SOLN
0.0000 [IU] | Freq: Three times a day (TID) | SUBCUTANEOUS | Status: DC
  Administered 2020-03-19: 5 [IU] via SUBCUTANEOUS
  Administered 2020-03-19: 2 [IU] via SUBCUTANEOUS
  Administered 2020-03-19: 3 [IU] via SUBCUTANEOUS
  Administered 2020-03-20: 2 [IU] via SUBCUTANEOUS
  Administered 2020-03-20: 3 [IU] via SUBCUTANEOUS
  Administered 2020-03-20: 2 [IU] via SUBCUTANEOUS
  Administered 2020-03-21: 5 [IU] via SUBCUTANEOUS
  Administered 2020-03-21: 2 [IU] via SUBCUTANEOUS

## 2020-03-19 NOTE — Plan of Care (Signed)
  Problem: Health Behavior/Discharge Planning: Goal: Ability to manage health-related needs will improve Outcome: Progressing   Problem: Clinical Measurements: Goal: Ability to maintain clinical measurements within normal limits will improve 03/19/2020 1436 by Charmian Muff, RN Outcome: Progressing 03/19/2020 1422 by Charmian Muff, RN Outcome: Progressing Goal: Will remain free from infection Outcome: Progressing Goal: Diagnostic test results will improve Outcome: Progressing Goal: Respiratory complications will improve Outcome: Progressing   Problem: Clinical Measurements: Goal: Ability to maintain clinical measurements within normal limits will improve 03/19/2020 1436 by Charmian Muff, RN Outcome: Progressing 03/19/2020 1422 by Charmian Muff, RN Outcome: Progressing Goal: Will remain free from infection Outcome: Progressing Goal: Diagnostic test results will improve Outcome: Progressing Goal: Respiratory complications will improve Outcome: Progressing

## 2020-03-19 NOTE — Plan of Care (Signed)
  Problem: Clinical Measurements: Goal: Ability to maintain clinical measurements within normal limits will improve Outcome: Progressing Goal: Respiratory complications will improve Outcome: Progressing   

## 2020-03-19 NOTE — Progress Notes (Signed)
YELLOW-GREEN Mews Score ongoing, due to pulse and resp. Pt resting without distress. Most activity causes increase in pulse and resp., pt recovers and return to baseline. Q 4hr VS ongoing. Will cont to monitor. SRP, RN

## 2020-03-19 NOTE — Progress Notes (Signed)
PROGRESS NOTE   Christina Vincent  FTD:[id number] DOB: 1960/10/05 DOA: 02/04/2020 PCP: Avon Gully, MD  Brief Narrative:  59 year old black female known history of depression HLD DM TY 2 BMI 45, HTN Admitted to [facility] initially with COVID-19-requiring 80% oxygen Despite receiving appropriate therapy decompensated needing high flow nasal cannula nonrebreather salter Found to have bilateral provoked PEs likely requiring 3 months long-term anticoagulation on CTA chest 02/12/2019 Brief ICU stay where she became lethargic and hypercarbic transferred to ICU CODE STATUS changed several times currently full code Found to have downtrending hemoglobin CT abdomen pelvis 2/4 showed hematoma pelvic muscles MRI confirmed the same-pulmonology IR reconsulted and decision made to hold off on further anticoagulation Placed on antibiotics for presumed bacterial pneumonia switched to linezolid blood cultures obtained 2/6 - superimposed bacterial pneumonia COVID-19 infection   Assessment & Plan:   Principal Problem:   Acute hypoxemic respiratory failure due to COVID-19 [facility]) Active Problems:   2019 novel coronavirus disease (COVID-19)   Hypertension   Type 2 diabetes mellitus (HCC)   Dyslipidemia   Depression   Class 1 obesity   Pulmonary emboli (HCC)   1. Secondary MRSA bacterial infection a. procalcitonin is low b. Continuing at this time vancomycin would complete on 2/9 and then reevaluate needs c. Trend WBC 2. COVID-19-requiring less oxygen now than prior a. Completed all therapies other than steroids expect long taper of prednisone b. Start Dulera c. Mobilize as best possible 3. Bilateral pulmonary embolism not on anticoagulation a. Received anticoagulation from 14225 2022-see below discussion 4. Large bilateral obturator hematoma a. Conferenced discussion with pulmonology and IR recommended holding blood thinner b. Outpatient reeval c. Not a candidate for further  anticoagulation 5. Severe anxiety a. Psychiatry reevaluated 2/7 b. No BZD therefore Remeron 15 at bedtime, gabapentin 100 twice daily Seroquel 25 twice daily c. Appreciate follow-up 6. Sinus tachycardia a. Combination of probable PE and anxiety-symptomatic management 7. Right renal cyst on CT abdomen a. Outpatient further characterization in the outpatient setting 8. Undiagnosed OSA OHS a. Needs trilogy vent versus other machine and outpatient planning and management 9. DM TY 2 a. CBG ranging 170s to 230 b. Expect poor control on steroids c. Considering Lantus in the next several days 10. Abnormal TSH  DVT prophylaxis: SCD Code Status: Full CODE STATUS Family Communication: Discussed with daughter at the bedside Disposition:  Status is: Inpatient  Remains inpatient appropriate because:Hemodynamically unstable, Persistent severe electrolyte disturbances and IV treatments appropriate due to intensity of illness or inability to take PO   Dispo:  Patient From: Home  Planned Disposition: Home with Health Care Svc  Expected discharge date: 03/21/2020  Medically stable for discharge: No         Consultants:   Multiple  Procedures: None  Antimicrobials: Vancomycin   Subjective: EOMI NCAT, anxious +, +  Objective: Vitals:   03/19/20 0900 03/19/20 1000 03/19/20 1100 03/19/20 1150  BP:    115/70  Pulse: (!) 113 (!) 112 (!) 126 (!) 122  Resp: (!) 39 (!) 27 (!) 27 (!) 22  Temp:      TempSrc:      SpO2: (!) 85% 96% 96% 90%  Weight:      Height:        Intake/Output Summary (Last 24 hours) at 03/19/2020 1626 Last data filed at 03/19/2020 1517 Gross per 24 hour  Intake 750 ml  Output 1100 ml  Net -350 ml   Filed Weights   02/04/20 1838  Weight: 98.9 kg  Examination:  EOMI NCAT anxious + + CTA B no added sound S1-S2 no murmur tachycardic Abdomen soft no rebound no guarding Neurologically intact no focal deficit ROM intact  Data Reviewed: I have personally  reviewed following labs and imaging studies  CO2 35 BUN/creatinine 6/0.3 WBC 12.6 Hemoglobin 8.8  COVID-19 Labs  No results for input(s): DDIMER, FERRITIN, LDH, CRP in the last 72 hours.  Lab Results  Component Value Date   SARSCOV2NAA POSITIVE (A) 02/04/2020     Radiology Studies: No results found.   Scheduled Meds: . arformoterol  15 mcg Nebulization BID  . vitamin C  500 mg Oral Daily  . budesonide  0.5 mg Nebulization BID  . chlorhexidine  15 mL Mouth Rinse BID  . Chlorhexidine Gluconate Cloth  6 each Topical Daily  . cholecalciferol  1,000 Units Oral Daily  . feeding supplement  237 mL Oral BID BM  . gabapentin  100 mg Oral BID  . guaiFENesin-codeine  10 mL Oral QID  . insulin aspart  0-15 Units Subcutaneous TID WC  . insulin aspart  0-5 Units Subcutaneous QHS  . mouth rinse  15 mL Mouth Rinse q12n4p  . mirtazapine  15 mg Oral QHS  . multivitamin with minerals  1 tablet Oral Daily  . mupirocin ointment  1 application Nasal BID  . predniSONE  10 mg Oral Q breakfast  . [START ON 03/21/2020] predniSONE  5 mg Oral Q breakfast  . QUEtiapine  25 mg Oral BID  . senna-docusate  2 tablet Oral BID  . sodium chloride flush  10-40 mL Intracatheter Q12H  . zinc sulfate  220 mg Oral Daily   Continuous Infusions: . vancomycin 750 mg (03/19/20 0536)     LOS: 43 days    Time spent: 46  Rhetta Mura, MD [facility] To contact the attending provider between 7A-7P or the covering provider during after hours 7P-7A, please log into the web site [email or web address] and access using universal [place] password for that web site. If you do not have the password, please call the hospital operator.  03/19/2020, 4:26 PM

## 2020-03-19 NOTE — Progress Notes (Signed)
Pt resting chair, up to [facility] sat 85%  during transfer and recovered increased O2 to 6 liters, large BM noted. Bath completed, tolerated activity in chair, O2 recovered about 5 minutes, increased anxiety when getting up. Currently in chair eating O2 sat 96% 3l [place]. Pt O2 @ 3l oxygen sat wavers while eating to 87-90%. Will cont to monitor. SRP, RN

## 2020-03-19 NOTE — Progress Notes (Signed)
Pt return to bed, up to [facility] small BM and voided large volume mixed with stool. Pt O2 increased to 10 liters briefly primary due to increased anxiety and panting. Encouraged pt to focus on breathing, did much better and sat remained 91-92% while ambulating to bed about 20 feet. Pt recovery time about 4 minutes, O2 returned to 3 liters O2 sat at 96%. Daughter actively participated in activity and demonstrated understanding. SRP, RN

## 2020-03-19 NOTE — TOC Progression Note (Signed)
Transition of Care [facility]) - Progression Note    Patient Details  Name: Christina Vincent MRN: [id number] Date of Birth: Jul 09, 1960  Transition of Care [facility]) CM/SW Contact  Geni Bers, RN Phone Number: 03/19/2020, 5:10 PM  Clinical Narrative:     Spoke in length to pt and daughter concerning Home with HH and BiPAP machine. CM working on both pt at present time. Plan was to discharge pt on Friday. MD made a Pulmonary appointment for Monday 2/14.    Expected Discharge Plan: Home/Self Care    Expected Discharge Plan and Services Expected Discharge Plan: Home/Self Care   Discharge Planning Services: CM Consult   Living arrangements for the past 2 months: Single Family Home                                       Social Determinants of Health (SDOH) Interventions    Readmission Risk Interventions No flowsheet data found.

## 2020-03-20 LAB — CBC WITH DIFFERENTIAL/PLATELET
Abs Immature Granulocytes: 0.06 10*3/uL (ref 0.00–0.07)
Basophils Absolute: 0 10*3/uL (ref 0.0–0.1)
Basophils Relative: 0 %
Eosinophils Absolute: 0.1 10*3/uL (ref 0.0–0.5)
Eosinophils Relative: 1 %
HCT: 30 % — ABNORMAL LOW (ref 36.0–46.0)
Hemoglobin: 8.8 g/dL — ABNORMAL LOW (ref 12.0–15.0)
Immature Granulocytes: 1 %
Lymphocytes Relative: 17 %
Lymphs Abs: 1.8 10*3/uL (ref 0.7–4.0)
MCH: 30 pg (ref 26.0–34.0)
MCHC: 29.3 g/dL — ABNORMAL LOW (ref 30.0–36.0)
MCV: 102.4 fL — ABNORMAL HIGH (ref 80.0–100.0)
Monocytes Absolute: 0.6 10*3/uL (ref 0.1–1.0)
Monocytes Relative: 6 %
Neutro Abs: 7.8 10*3/uL — ABNORMAL HIGH (ref 1.7–7.7)
Neutrophils Relative %: 75 %
Platelets: 360 10*3/uL (ref 150–400)
RBC: 2.93 MIL/uL — ABNORMAL LOW (ref 3.87–5.11)
RDW: 19.1 % — ABNORMAL HIGH (ref 11.5–15.5)
WBC: 10.3 10*3/uL (ref 4.0–10.5)
nRBC: 0.2 % (ref 0.0–0.2)

## 2020-03-20 LAB — GLUCOSE, CAPILLARY
Glucose-Capillary: 132 mg/dL — ABNORMAL HIGH (ref 70–99)
Glucose-Capillary: 181 mg/dL — ABNORMAL HIGH (ref 70–99)
Glucose-Capillary: 139 mg/dL — ABNORMAL HIGH (ref 70–99)
Glucose-Capillary: 148 mg/dL — ABNORMAL HIGH (ref 70–99)

## 2020-03-20 LAB — COMPREHENSIVE METABOLIC PANEL
ALT: 32 U/L (ref 0–44)
AST: 19 U/L (ref 15–41)
Albumin: 2.8 g/dL — ABNORMAL LOW (ref 3.5–5.0)
Alkaline Phosphatase: 65 U/L (ref 38–126)
Anion gap: 10 (ref 5–15)
BUN: 5 mg/dL — ABNORMAL LOW (ref 6–20)
CO2: 32 mmol/L (ref 22–32)
Calcium: 8.5 mg/dL — ABNORMAL LOW (ref 8.9–10.3)
Chloride: 99 mmol/L (ref 98–111)
Creatinine, Ser: 0.42 mg/dL — ABNORMAL LOW (ref 0.44–1.00)
GFR, Estimated: >60 mL/min (ref >60)
Glucose, Bld: 119 mg/dL — ABNORMAL HIGH (ref 70–99)
Potassium: 3.7 mmol/L (ref 3.5–5.1)
Sodium: 141 mmol/L (ref 135–145)
Total Bilirubin: 1 mg/dL (ref 0.3–1.2)
Total Protein: 5.5 g/dL — ABNORMAL LOW (ref 6.5–8.1)

## 2020-03-20 LAB — MAGNESIUM: Magnesium: 2.2 mg/dL (ref 1.7–2.4)

## 2020-03-20 MED ORDER — PROSOURCE PLUS PO LIQD
30.0000 mL | Freq: Two times a day (BID) | ORAL | Status: DC
  Administered 2020-03-21: 30 mL via ORAL
  Filled 2020-03-20: qty 30

## 2020-03-20 NOTE — Progress Notes (Signed)
Pt coughing while eating and drinking meals, encourage pt to eat slowly and tuck chin when swallowing. Pt continues to cough at interval especially more so during eating, drinking, and sometime conversations. Encourage warm tea to soothe throat when medicine are not due. Warm liquids are helpful, discouraged cold liquids. MD made aware of coughing and new orders noted. SRP,RN

## 2020-03-20 NOTE — Progress Notes (Signed)
PROGRESS NOTE   Christina Vincent  FIE:[id number] DOB: 01-Oct-1960 DOA: 02/04/2020 PCP: Avon Gully, MD  Brief Narrative:  60 year old black female known history of depression HLD DM TY 2 BMI 45, HTN Admitted to [facility] initially with COVID-19-requiring 80% oxygen Despite receiving appropriate therapy decompensated needing high flow nasal cannula nonrebreather salter Found to have bilateral provoked PEs likely requiring 3 months long-term anticoagulation on CTA chest 02/12/2019 Brief ICU stay where she became lethargic and hypercarbic transferred to ICU CODE STATUS changed several times currently full code Found to have downtrending hemoglobin CT abdomen pelvis 2/4 showed hematoma pelvic muscles MRI confirmed the same-pulmonology IR reconsulted and decision made to hold off on further anticoagulation Placed on antibiotics for presumed bacterial pneumonia switched to linezolid blood cultures obtained 2/6 - superimposed bacterial pneumonia COVID-19 infection   Assessment & Plan:   Principal Problem:   Acute hypoxemic respiratory failure due to COVID-19 [facility]) Active Problems:   2019 novel coronavirus disease (COVID-19)   Hypertension   Type 2 diabetes mellitus (HCC)   Dyslipidemia   Depression   Class 1 obesity   Pulmonary emboli (HCC)   1. Secondary MRSA bacterial infection a. procalcitonin is low b. Completed 7-day course of antibiotics including vancomycin 2/9 c. Trend WBC 2. COVID-19-requiring less oxygen now than prior a. Completed all therapies other than steroids expect long taper of prednisone b. Continue Brovana 15 twice daily Pulmicort 0.5 twice daily Mucinex and albuterol every 4 as needed c. Mobilize as best possible 3. Bilateral pulmonary embolism not on anticoagulation a. Received anticoagulation from 14225 2022-see below discussion 4. Large bilateral obturator hematoma a. Conferenced discussion with pulmonology and IR recommended holding blood thinner  secondary to hematoma that occurred b. Outpatient reeval c. Not a candidate for further anticoagulation currently 5. Severe anxiety a. Psychiatry reevaluated 2/7 b. No BZD therefore Remeron 15 at bedtime, gabapentin 100 twice daily Seroquel 25 twice daily c. More euthymic more pleasant less anxious 6. Sinus tachycardia a. Combination of probable PE and anxiety-symptomatic management b. Heart rate is improved 7. Right renal cyst on CT abdomen a. Outpatient further characterization in the outpatient setting 8. Undiagnosed OSA OHS a. Vent needs to be ordered versus BiPAP prior to discharge-Case management working on the same 9. DM TY 2 a. CBG ranging 130-180 b. Better controlled with lower dose of steroids 10. Abnormal TSH  DVT prophylaxis: SCD Code Status: Full CODE STATUS Family Communication: No family present at bedside today Disposition:  Status is: Inpatient  Remains inpatient appropriate because:Hemodynamically unstable, Persistent severe electrolyte disturbances and IV treatments appropriate due to intensity of illness or inability to take PO   Patient will discharge home with home health probably on 2/11 once BiPAP can be ordered and coordinated     Consultants:   Multiple  Procedures: None  Antimicrobials: Vancomycin   Subjective: Less anxious no distress Eating drinking without specific deficit no chest pain no fever No nausea no vomiting Was able to work with therapy today became winded likely secondary to her anxiety  Objective: Vitals:   03/20/20 0400 03/20/20 0453 03/20/20 0905 03/20/20 0957  BP:    109/70  Pulse:  81  (!) 109  Resp: 20 20  20   Temp:  98.6 F (37 C)  99.5 F (37.5 C)  TempSrc:  Oral  Axillary  SpO2:  98% 91% 95%  Weight:      Height:        Intake/Output Summary (Last 24 hours) at 03/20/2020 1329  Last data filed at 03/20/2020 0453 Gross per 24 hour  Intake 150 ml  Output 800 ml  Net -650 ml   Filed Weights   02/04/20  1838  Weight: 98.9 kg    Examination:  EOMI NCAT anxious + + CTA B no added sound S1-S2 no murmur tachycardic Abdomen soft no rebound no guarding Neurologically intact no focal deficit ROM intact  Data Reviewed: I have personally reviewed following labs and imaging studies  CO2 32 BUN/creatinine 5/0.4 WBC 12.6-->10.3 Hemoglobin 8.8  COVID-19 Labs  No results for input(s): DDIMER, FERRITIN, LDH, CRP in the last 72 hours.  Lab Results  Component Value Date   SARSCOV2NAA POSITIVE (A) 02/04/2020     Radiology Studies: No results found.   Scheduled Meds: . arformoterol  15 mcg Nebulization BID  . vitamin C  500 mg Oral Daily  . budesonide  0.5 mg Nebulization BID  . chlorhexidine  15 mL Mouth Rinse BID  . Chlorhexidine Gluconate Cloth  6 each Topical Daily  . cholecalciferol  1,000 Units Oral Daily  . feeding supplement  237 mL Oral BID BM  . gabapentin  100 mg Oral BID  . guaiFENesin-codeine  10 mL Oral QID  . insulin aspart  0-15 Units Subcutaneous TID WC  . insulin aspart  0-5 Units Subcutaneous QHS  . mouth rinse  15 mL Mouth Rinse q12n4p  . mirtazapine  15 mg Oral QHS  . multivitamin with minerals  1 tablet Oral Daily  . mupirocin ointment  1 application Nasal BID  . [START ON 03/21/2020] predniSONE  5 mg Oral Q breakfast  . QUEtiapine  25 mg Oral BID  . senna-docusate  2 tablet Oral BID  . sodium chloride flush  10-40 mL Intracatheter Q12H  . zinc sulfate  220 mg Oral Daily   Continuous Infusions:    LOS: 44 days    Time spent: 46  Rhetta Mura, MD [facility] To contact the attending provider between 7A-7P or the covering provider during after hours 7P-7A, please log into the web site [email or web address] and access using universal [place] password for that web site. If you do not have the password, please call the hospital operator.  03/20/2020, 1:29 PM

## 2020-03-20 NOTE — Progress Notes (Signed)
Nutrition Follow-up  RD working remotely.  DOCUMENTATION CODES:   Not applicable  INTERVENTION:  - continue Ensure Enlive BID. - will order 30 ml Prosource Plus BID, each supplement provides 100 kcal and 15 grams protein.    NUTRITION DIAGNOSIS:   Increased nutrient needs related to acute illness as evidenced by estimated needs. -ongoing  GOAL:   Patient will meet greater than or equal to 90% of their needs -unmet on average  MONITOR:   PO intake,Supplement acceptance,Labs,Weight trends  ASSESSMENT:   60 y.o. female with medical history of type 2 DM, HTN, HLD, obesity, and depression. She presented to the ED with fever and dyspnea. She was found to be COVID-19 positive on 02/04/20. CT angio chest on  showed worsening airspace disease compatible with progressive COVID PNA and acute bilateral pulmonary emboli. CXR 1/13, 1/18, and 1/20 showed significant bilateral opacities. On 1/23 she was lethargic and severely hypercarbic so she was placed on BiPAP and transferred to the ICU. Patient is now DNR.  Intakes have been variable, 25-100%, over the past 1 week. She has been accepting Ensure 25-50% of the time offered.   She has not been weighed since admission on 12/27.   MD note today states that patient to d/c home with Home Health, possibly tomorrow (2/11).     Labs reviewed; CBGs: 132 and 181 mg/dl, BUN: 5 mg/dl, creatinine: 6.46 mg/dl, Ca: 8.5 mg/dl. Medications reviewed; 500 mg ascorbic acid/day, 1000 units cholecalciferol/day, sliding scale novolog, 10 mg deltasone 2/9 and 2/10, 5 mg deltasone 2/11 and 2/12, 2 tablets senokot BID, 220 mg zinc sulfate/day.    Diet Order:   Diet Order            Diet regular Room service appropriate? Yes; Fluid consistency: Thin  Diet effective now                 EDUCATION NEEDS:   Not appropriate for education at this time  Skin:  Skin Assessment: Skin Integrity Issues: Skin Integrity Issues:: Other (Comment) Other: wound to  buttocks (2/2)  Last BM:  2/8  Height:   Ht Readings from Last 1 Encounters:  02/04/20 5\' 8"  (1.727 m)    Weight:   Wt Readings from Last 1 Encounters:  02/04/20 98.9 kg    Estimated Nutritional Needs:  Kcal:  2300-2500 kcal Protein:  120-135 grams Fluid:  >/= 2.5 L/day     02/06/20, MS, RD, LDN, CNSC Inpatient Clinical Dietitian RD pager # available in [facility]  After hours/weekend pager # available in [facility]

## 2020-03-20 NOTE — Progress Notes (Signed)
Pt VS was not resting state pt getting up to chair with assistance. Rechecked VS once pt begin to settle down (resting state).

## 2020-03-20 NOTE — Progress Notes (Signed)
Occupational Therapy Treatment Patient Details Name: Christina Vincent MRN: [id number] DOB: Jul 28, 1960 Today's Date: 03/20/2020    History of present illness Pt is 60 y.o. female with medical history significant of depression, dyslipidemia, type 2 diabetes, class I obesity, hypertension who is coming to the emergency department due to progressively worse shortness of breath for the past 6 days associated with fever, chills, fatigue, malaise, sore throat, occasionally productive of whitish/clear dry cough and rhinorrhea.  Patient is unvaccinated and has been admitted for acute hypoxemic respiratory failure secondary to COVID-19.  Pt also found to have PE 02/12/20. Patient became lethargic 1/23 and transferred to ICU - almost intubated but family opted for Do Not Intubate and bipap was used instead. Patient currently in progressive care on 3 L at rest   OT comments  Treatment focused on improving activity tolerance, controlling RR and anxiety with movement/activity. Patient found on 3 L at rest in supine. Increased to 4L with exercises in bed. Patient required increased time verbal cues for breathing techniques, encouragement, and reducing anxiety during bed exercises and transfer to chair. Needs time to recover between each task. O2 sat dropped to 80% with transfer to chair on 4 L. Increased to 10 L to recover and for second stand/squat after incontinent episode - which she maintained approx 30 seconds. Total assist for toileting as she is unable to physically assist due to anxiety and trying to recover. Techniques used to promote slower respirations and reduction of anxiety include education and instruction on diaphragmatic breathing with hands on chest and stomach, distraction with conversation, and grounding exercise with use of ice in palm of hand, and compassionate listening. Patient taking 3-5 minutes to recover and reduce anxiety after each task. Therapist recommended use of Depends at work due to patient's  incontinence. Patient agreeable to therapy and tries hard but anxiety and her inability to control breathing pattern with anxiety continues to hinder significant gains.    Follow Up Recommendations  Home health OT    Equipment Recommendations  3 in 1 bedside commode;Tub/shower seat    Recommendations for Other Services      Precautions / Restrictions Precautions Precautions: Fall Precaution Comments: monitor sats, anxious and emotional with activity, Increase oxygen for activity       Mobility Bed Mobility Overal bed mobility: Needs Assistance Bed Mobility: Supine to Sit     Supine to sit: Supervision;HOB elevated     General bed mobility comments: Supervision to transfer to side of bed. Increased time, verbal cues for breathing, encouragement and anxiety management.  Transfers Overall transfer level: Needs assistance Equipment used: None Transfers: Sit to/from [employer] Sit to Stand: Min assist Stand pivot transfers: Min assist       General transfer comment: MIn assist, hand hold to stand and transfer to recliner. O2 sat dropped to 80% on 4 L. increased to 10 L to recover. Took less than 1 minute to recover o2 sat.    Balance Overall balance assessment: Needs assistance Sitting-balance support: Feet supported Sitting balance-Leahy Scale: Fair     Standing balance support: Bilateral upper extremity supported Standing balance-Leahy Scale: Poor Standing balance comment: external reliance - more from anxiety                           ADL either performed or assessed with clinical judgement   ADL  Toileting- Clothing Manipulation and Hygiene: Total assistance Toileting - Clothing Manipulation Details (indicate cue type and reason): Total assist for toileting - managing hospital gown and cleaning after incontinent episode after transfer to recliner. Patient's anxiety limits her ability to  participate in normal activities. Needed to stand for approx 30 seconds while hospital gown removed and new bed pad placed in recliner.             Vision Patient Visual Report: No change from baseline     Perception     Praxis      Cognition Arousal/Alertness: Awake/alert Behavior During Therapy: Anxious Overall Cognitive Status: Within Functional Limits for tasks assessed                                          Exercises Other Exercises Other Exercises: approx 10 minute supine bed level exercises to work on activity tolerance, controlling RR and anxiety; performed straight leg raises x 10 each leg, heel slides x 10 each leg. Needed several minutes to recover during each exercise and in between. Other Exercises: Long sitting in bed   Shoulder Instructions       General Comments      Pertinent Vitals/ Pain       Pain Assessment: No/denies pain  Home Living                                          Prior Functioning/Environment              Frequency  Min 2X/week        Progress Toward Goals  OT Goals(current goals can now be found in the care plan section)  Progress towards OT goals: Progressing toward goals  Acute Rehab OT Goals Patient Stated Goal: to go home OT Goal Formulation: With family Time For Goal Achievement: 04/03/20 Potential to Achieve Goals: Fair  Plan Discharge plan needs to be updated    Co-evaluation                 AM-PAC OT "6 Clicks" Daily Activity     Outcome Measure   Help from another person eating meals?: None Help from another person taking care of personal grooming?: A Little Help from another person toileting, which includes using toliet, bedpan, or urinal?: Total Help from another person bathing (including washing, rinsing, drying)?: A Lot Help from another person to put on and taking off regular upper body clothing?: A Lot Help from another person to put on and taking off  regular lower body clothing?: A Lot 6 Click Score: 14    End of Session    OT Visit Diagnosis: Muscle weakness (generalized) (M62.81)   Activity Tolerance Other (comment) (anxiety)   Patient Left in chair;with call bell/phone within reach   Nurse Communication Mobility status        Time: 1660-6301 OT Time Calculation (min): 54 min  Charges: OT General Charges $OT Visit: 1 Visit OT Treatments $Therapeutic Activity: 23-37 mins $Therapeutic Exercise: 23-37 mins  Wataru Mccowen, OTR/L Acute Care Rehab Services  Office [phone] Pager: [phone]    [name] 03/20/2020, 12:01 PM

## 2020-03-20 NOTE — Progress Notes (Addendum)
SATURATION QUALIFICATIONS: (This note is used to comply with regulatory documentation for home oxygen)  Patient Saturations on Room Air at Rest  85%   Patient Saturations on Room Air while Ambulating: Requires O2 while ambulating desats to 80%.   Patient Saturations on 5 Liters of oxygen while Ambulating 91%. Baseline at rest 3 liters of oxygen sat 94%  Please briefly explain why patient needs home oxygen: to maintain oxygenation while completing daily activities and ambulation. Post COVID patient extended hospital stay, with intense oxygen therapy. Prolonged oxygen use.   SRP, RN

## 2020-03-20 NOTE — Plan of Care (Signed)
  Problem: Health Behavior/Discharge Planning: Goal: Ability to manage health-related needs will improve Outcome: Adequate for Discharge   Problem: Clinical Measurements: Goal: Ability to maintain clinical measurements within normal limits will improve Outcome: Adequate for Discharge Goal: Will remain free from infection Outcome: Adequate for Discharge Goal: Diagnostic test results will improve Outcome: Adequate for Discharge Goal: Respiratory complications will improve Outcome: Adequate for Discharge Goal: Cardiovascular complication will be avoided Outcome: Adequate for Discharge   Problem: Activity: Goal: Risk for activity intolerance will decrease Outcome: Adequate for Discharge   

## 2020-03-20 NOTE — Evaluation (Signed)
Clinical/Bedside Swallow Evaluation Patient Details  Name: Christina Vincent MRN: [id number] Date of Birth: 1960-10-20  Today's Date: 03/20/2020 Time: SLP Start Time (ACUTE ONLY): 1829 SLP Stop Time (ACUTE ONLY): 1902 SLP Time Calculation (min) (ACUTE ONLY): 33 min  Past Medical History:  Past Medical History:  Diagnosis Date  . Depression   . Dyslipidemia    Past Surgical History:  Past Surgical History:  Procedure Laterality Date  . COLONOSCOPY     1990's; hemorrhoids   HPI:    Pt is 60 y.o. female with medical history significant of major depressive disorder, dyslipidemia, type 2 diabetes, class I obesity, hypertension who is coming to the emergency department due to progressively worse shortness of breath for the past 6 days associated with fever, chills, fatigue, malaise, sore throat, occasionally productive of whitish/clear dry cough and rhinorrhea.  Patient was unvaccinated and had been admitted for acute hypoxemic respiratory failure secondary to COVID-19.  Pt also found to have PE 02/12/20. Patient became lethargic 1/23 and transferred to ICU - almost intubated but family opted for Do Not Intubate and bipap was used instead. Pt also required NG tube in place for 2 days due to ileus (late January).  Daughter reports that since pt has been eating more, she has developed coughing with intake and has higher oxygen needs.  Pt confirms coughing with intake.   Mild diffuse ground glass opacities per CT abdomen and retained stool.  Per hospitalist, Dr Scharlene Gloss note, pt with retractable cough due to PE.  Swallow evaluation ordered to provide tips to mitigate aspiration.  Assessment / Plan / Recommendation Clinical Impression  Patient with negative CN exam.  Cough noted at baseline when pt's HOB was elevated after repositioned in bed.  She was also noted to have dry cough with speaking and laughing.  SLP observed pt consuming soda via straw, sandwich and soup boluses.  RR varies from 20's to high 30's  and pt repeatedly admitted to anxiety issues, which appear to decrease tolerance.  Dry cough x2 while masticating solids and x2 after swallowing.  Warm liquids - tea- and ceasing use of straw decreased cough.  SLP demonstrated diaphragmatic breathing to help pt focus on breathing while eating and decrease anxiety.  Pt returned demonstration.  Advised pt to focus timing of swallow initiation at beginning of exhalation to help faciliate protective exhalation post-swallow.  Given pt also has h/o reflux requiring PPI usage in the past - along with her report of belching, question if she may have a component of reflux.  No indication of significant dysphagia - including multiple swallows, clinical appearance of aspiration related cough.  Encouraged pt to consume smoother foods if easier to swallow and rest if dyspneic.  Distracting pt by conversation re: pleasant matters may diminish her anxiety and improve her control of respiratory rate.  SLP can not definitively rule out aspiration clinically and if concerns are present MBS warranted. SLP Visit Diagnosis: Dysphagia, unspecified (R13.10)    Aspiration Risk  Mild aspiration risk    Diet Recommendation Regular;Thin liquid   Liquid Administration via: Cup Medication Administration: Whole meds with liquid Supervision: Patient able to self feed Compensations: Slow rate;Small sips/bites (start intake with liquids due to xerostomia) Postural Changes: Remain upright for at least 30 minutes after po intake;Seated upright at 90 degrees    Other  Recommendations   MBS if concern for overt aspiration or silent aspiration present  Follow up Recommendations None      Frequency and Duration  na         Prognosis   nA     Swallow Study   General   Pt is 60 y.o. female with medical history significant of major depressive disorder, dyslipidemia, type 2 diabetes, class I obesity, hypertension who is coming to the emergency department due to progressively  worse shortness of breath for the past 6 days associated with fever, chills, fatigue, malaise, sore throat, occasionally productive of whitish/clear dry cough and rhinorrhea.  Patient was unvaccinated and had been admitted for acute hypoxemic respiratory failure secondary to COVID-19.  Pt also found to have PE 02/12/20. Patient became lethargic 1/23 and transferred to ICU - almost intubated but family opted for Do Not Intubate and bipap was used instead. Pt also required NG tube in place for 2 days due to ileus (late January).  Daughter reports that since pt has been eating more, she has developed coughing with intake and has higher oxygen needs.  Pt confirms coughing with intake.   Mild diffuse ground glass opacities per CT abdomen and retained stool.  Per hospitalist, Dr Scharlene Gloss note, pt with retractable cough due to PE.  Swallow evaluation ordered to provide tips to mitigate aspiration.   Oral/Motor/Sensory Function Overall Oral Motor/Sensory Function: Within functional limits   Ice Chips Ice chips: Not tested   Thin Liquid Other Comments: intermittent dry cough noted after swallowing, RR varies from 20's to high 30's.  Use of cup vs straw mitigated coughing.    Nectar Thick Nectar Thick Liquid: Not tested   Honey Thick Honey Thick Liquid: Not tested   Puree Puree: Within functional limits (broth and very soft noodle consumed without evidence of coughing) Presentation: Spoon;Self Fed   Solid     Other Comments: cough noted during mastication x2 and post-swallow x2 with intake of sandwich - pt also with occasional cough when talking      Chales Abrahams 03/20/2020,10:56 PM    Rolena Infante, MS [facility] SLP Acute Rehab Services Office [phone] Pager [phone]

## 2020-03-20 NOTE — TOC Progression Note (Signed)
Transition of Care [facility]) - Progression Note    Patient Details  Name: Christina Vincent MRN: [id number] Date of Birth: September 23, 1960  [facility]) CM/SW Contact  Geni Bers, RN Phone Number: 03/20/2020, 12:25 PM  Clinical Narrative:    Forms completed for BiPAP and HHPT/OT. Will need orders for HHPT/OT.    Expected Discharge Plan: Home/Self Care    Expected Discharge Plan and Services Expected Discharge Plan: Home/Self Care   Discharge Planning Services: CM Consult   Living arrangements for the past 2 months: Single Family Home                                       Social Determinants of Health (SDOH) Interventions    Readmission Risk Interventions No flowsheet data found.

## 2020-03-20 NOTE — Progress Notes (Signed)
Pt returned to bed, O2 increased to 5 liters sat decreased to 85%, recovered O2 returned to 3l, O2 sat 94-95%, pt begin to cough, sat dropped slightly to 85-88%, cough intermittently increased O2 to 5 liters as pt cont to recover. Destiny, pt daughter, at bedside and actively participated in transferring pt to bed and talking about O2 options as pt recovers. Pt daughter demonstrated understanding comfort in working with mother and ask appropriate questions pertaining to her care. Will continue to teach family home management and prepare for potential discharge. SRP, RN

## 2020-03-20 NOTE — Progress Notes (Addendum)
PRN cough med given and Dulera MDI helped stopped the cough. SRP, RN

## 2020-03-21 LAB — GLUCOSE, CAPILLARY
Glucose-Capillary: 127 mg/dL — ABNORMAL HIGH (ref 70–99)
Glucose-Capillary: 208 mg/dL — ABNORMAL HIGH (ref 70–99)
Glucose-Capillary: 170 mg/dL — ABNORMAL HIGH (ref 70–99)

## 2020-03-21 MED ORDER — SENNOSIDES-DOCUSATE SODIUM 8.6-50 MG PO TABS: 2.0000 | ORAL_TABLET | Freq: Two times a day (BID) | ORAL | 0 refills | Status: AC

## 2020-03-21 MED ORDER — QUETIAPINE FUMARATE 25 MG PO TABS
25.0000 mg | ORAL_TABLET | Freq: Two times a day (BID) | ORAL | 2 refills | Status: AC
Start: 2020-03-21 — End: ?

## 2020-03-21 MED ORDER — LIP MEDEX EX OINT: TOPICAL_OINTMENT | CUTANEOUS | 0 refills | Status: AC | PRN

## 2020-03-21 MED ORDER — PREDNISONE 5 MG PO TABS: 5.0000 mg | ORAL_TABLET | Freq: Every day | ORAL | 0 refills | Status: DC

## 2020-03-21 MED ORDER — TRAZODONE HCL 50 MG PO TABS: 50.0000 mg | ORAL_TABLET | Freq: Every evening | ORAL | 2 refills | Status: AC | PRN

## 2020-03-21 MED ORDER — CHLORHEXIDINE GLUCONATE 0.12 % MT SOLN: 15.0000 mL | Freq: Two times a day (BID) | OROMUCOSAL | 0 refills | Status: AC

## 2020-03-21 MED ORDER — GABAPENTIN 100 MG PO CAPS: 100.0000 mg | ORAL_CAPSULE | Freq: Two times a day (BID) | ORAL | 1 refills | Status: AC

## 2020-03-21 MED ORDER — MIRTAZAPINE 15 MG PO TABS: 15.0000 mg | ORAL_TABLET | Freq: Every day | ORAL | 2 refills | Status: AC

## 2020-03-21 MED ORDER — POLYETHYLENE GLYCOL 3350 17 G PO PACK: 17.0000 g | PACK | Freq: Every day | ORAL | 0 refills | Status: AC | PRN

## 2020-03-21 MED ORDER — MAGIC MOUTHWASH: 15.0000 mL | Freq: Three times a day (TID) | ORAL | 0 refills | Status: AC | PRN

## 2020-03-21 MED ORDER — GUAIFENESIN-CODEINE 100-10 MG/5ML PO SOLN: 10.0000 mL | ORAL | 0 refills | Status: DC | PRN

## 2020-03-21 MED ORDER — BUDESONIDE 0.5 MG/2ML IN SUSP: 0.5000 mg | Freq: Two times a day (BID) | RESPIRATORY_TRACT | 12 refills | Status: AC

## 2020-03-21 MED ORDER — HYDROCORTISONE (PERIANAL) 2.5 % EX CREA: TOPICAL_CREAM | Freq: Two times a day (BID) | CUTANEOUS | 0 refills | Status: AC | PRN

## 2020-03-21 MED ORDER — ALBUTEROL SULFATE (2.5 MG/3ML) 0.083% IN NEBU
2.5000 mg | INHALATION_SOLUTION | RESPIRATORY_TRACT | 12 refills | Status: AC | PRN
Start: 2020-03-21 — End: ?

## 2020-03-21 NOTE — TOC Progression Note (Signed)
Transition of Care [facility]) - Progression Note    Patient Details  Name: DOVIE KAPUSTA MRN: [id number] Date of Birth: 30-Mar-1960  [facility]) CM/SW Contact  Geni Bers, RN Phone Number: 03/21/2020, 2:36 PM  Clinical Narrative:    Spoke with pt's daughter Babette Relic for a length of time concerning Home Health from [place], [place] is not available due to nursing shortage. However, Home Health Physical Therapy and Occupational Therapy will come into the home 2 times a week each for a limited time. Explained BIPAP will be delivered to pt's room between 4-6 PM.  Oxygen was delivered to the pt's room from [employer]. Daughter asked if someone would show her how to use the DME. Explained to her that [employer] rep would teach her. Destiny then asked for a wheel chair and rolling walker. Explained to her that insurance would pay for both, will check to see if pt could have a WC. [employer] was called, pt could not have both, wheel chair was selected.  Destiny states mother pt has Medicaid now.  DME to be delivered to pt's room.    Expected Discharge Plan: Home/Self Care    Expected Discharge Plan and Services Expected Discharge Plan: Home/Self Care   Discharge Planning Services: CM Consult   Living arrangements for the past 2 months: Single Family Home Expected Discharge Date: 03/21/20                                     Social Determinants of Health (SDOH) Interventions    Readmission Risk Interventions No flowsheet data found.

## 2020-03-21 NOTE — Progress Notes (Signed)
Physical Therapy Treatment Patient Details Name: Christina Vincent MRN: [id number] DOB: 02-17-1960 Today's Date: 03/21/2020    History of Present Illness Pt is 60 y.o. female with medical history significant of depression, dyslipidemia, type 2 diabetes, class I obesity, hypertension who is coming to the emergency department due to progressively worse shortness of breath for the past 6 days associated with fever, chills, fatigue, malaise, sore throat, occasionally productive of whitish/clear dry cough and rhinorrhea.  Patient is unvaccinated and has been admitted for acute hypoxemic respiratory failure secondary to COVID-19.  Pt also found to have PE 02/12/20. Patient became lethargic 1/23 and transferred to ICU - almost intubated but family opted for Do Not Intubate and bipap was used instead. Patient currently in progressive care on 3 L at rest    PT Comments    Pt OOB in recliner on 4 lts at rest 94%. General transfer comment: Supervision for safety lines/tubing pt self able to rise and pivot from recliner to [facility] with one VC on hand placement.  Required increased time due to anxiety and increased rest break after due to anxiety plus dyspnea General Gait Details: pt was able to amb accross the room to another chair placed 6 feet away.  Required increased time due to anxiety.  Pt gets "worked up" and tends to hyperventilate.  Anxiety is NOT new pt stated "I get like this".  "Give me a minute".  Started on 4 lts at rest pt required 6 to 8 Lts during activity to achieve sats >88% and HR increased to 138. Left pt in recliner and decreased back to 4 lts with sats 92%.  "I can't believe I did that" (walk around the room) Pt plans to return home with spouse (truck driver) and daughter  SATURATION QUALIFICATIONS: (This note is used to comply with regulatory documentation for home oxygen)  Patient Saturations on 4 Lts at Rest = 94%  Patient Saturations on 6 Lts  while Ambulating = 84% Patient Saturations on 8  Lts while Ambulating 6 feet = 90%   Please briefly explain why patient needs home oxygen:  Pt requires supplemental oxygen to achieve therapeutic levels    Follow Up Recommendations  Home health PT;Supervision/Assistance - 24 hour     Equipment Recommendations  Rolling walker with 5" wheels;3in1 (PT)    Recommendations for Other Services       Precautions / Restrictions Precautions Precautions: Fall Precaution Comments: monitor sats, anxious and emotional with activity, Increase oxygen for activity    Mobility  Bed Mobility               General bed mobility comments: Pt OOB in recliner    Transfers Overall transfer level: Needs assistance Equipment used: None Transfers: Sit to/from [employer] Sit to Stand: Supervision Stand pivot transfers: Supervision       General transfer comment: Supervision for safety lines/tubing pt self able to rise and pivot from recliner to [facility] with one VC on hand placement.  Required increased time due to anxiety and increased rest break after due to anxiety plus dyspnea  Ambulation/Gait Ambulation/Gait assistance: Supervision;Min guard Gait Distance (Feet): 12 Feet (6 feet x 2 with a 7 min rest break between) Assistive device: Rolling walker (2 wheeled) Gait Pattern/deviations: Step-through pattern;Decreased stride length Gait velocity: decreased   General Gait Details: pt was able to amb accross the room to another chair placed 6 feet away.  Required increased time due to anxiety.  Pt gets "worked up" and tends  to hyperventilate.  Anxiety is NOT new pt stated "I get like this".  "Give me a minute".  Started on 4 lts at rest pt required 6 to 8 Lts during activity to achieve sats >88% and HR increased to 138.   Stairs             Wheelchair Mobility    Modified Rankin (Stroke Patients Only)       Balance                                            Cognition   Behavior During  Therapy: Anxious;WFL for tasks assessed/performed Overall Cognitive Status: Within Functional Limits for tasks assessed                                 General Comments: AxO x 3 VERY nervous/anxious/scared HIGH ANXIETY      Exercises      General Comments        Pertinent Vitals/Pain      Home Living                      Prior Function            PT Goals (current goals can now be found in the care plan section) Progress towards PT goals: Progressing toward goals    Frequency    Min 3X/week      PT Plan Current plan remains appropriate    Co-evaluation              AM-PAC PT "6 Clicks" Mobility   Outcome Measure  Help needed turning from your back to your side while in a flat bed without using bedrails?: None Help needed moving from lying on your back to sitting on the side of a flat bed without using bedrails?: None Help needed moving to and from a bed to a chair (including a wheelchair)?: None Help needed standing up from a chair using your arms (e.g., wheelchair or bedside chair)?: None Help needed to walk in hospital room?: A Little Help needed climbing 3-5 steps with a railing? : A Little 6 Click Score: 22    End of Session Equipment Utilized During Treatment: Gait belt;Oxygen Activity Tolerance: Patient limited by fatigue;Other (comment) (ANXIETY) Patient left: in chair;with call bell/phone within reach Nurse Communication: Mobility status PT Visit Diagnosis: Other abnormalities of gait and mobility (R26.89)     Time: 2725-3664 PT Time Calculation (min) (ACUTE ONLY): 25 min  Charges:  $Gait Training: 8-22 mins $Therapeutic Activity: 8-22 mins                     Felecia Shelling  PTA Acute  Rehabilitation Services Pager      [phone] Office      [phone]

## 2020-03-21 NOTE — Care Management (Addendum)
    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  A cane, crutch, or walker will not resolve issue with performing activities of daily living. A wheelchair will allow patient to safely perform daily activities. Patient can safely propel the wheelchair in the home or has a caregiver who can provide assistance. Length of need Lifetime. Accessories: elevating leg rests (ELRs), wheel locks, extensions and anti-tippers.   03/21/20 1357   03/21/20 0000  For home use only DME Nebulizer machine       Question Answer Comment  Patient needs a nebulizer to treat with the following condition COVID-19   Length of Need 6 Months      03/21/20 1146   03/20/20 1317  For home use only DME Bipap  Once       Question Answer Comment  Length of Need Lifetime   Bleed in oxygen (LPM) 30 percent, settings 16/6 settings   Inspiratory pressure OTHER SEE COMMENTS   Expiratory pressure OTHER SEE COMMENTS      03/20/20 1317   03/18/20 1520  For home use only DME Walker rolling  Once       Question Answer Comment  Walker: With 5 Inch Wheels   Patient needs a walker to treat with the following condition Ambulatory dysfunction      03/18/20 1519   03/18/20 1520  For home use only DME 3 n 1  Once        03/18/20 1519   03/18/20 0700  For home use only DME oxygen  Once       Question Answer Comment  Length of Need 6 Months   Mode or (Route) Nasal cannula   Liters per Minute 4   Frequency Continuous (stationary and portable oxygen unit needed)   Oxygen conserving device Yes   Oxygen delivery system Gas      03/18/20 0615   03/17/20 0637  For home use only DME 3 n 1  Once        03/17/20 0636   03/17/20 0637  For home use only  DME Tub bench  Once        03/17/20 0636

## 2020-03-21 NOTE — Discharge Summary (Signed)
Physician Discharge Summary  Christina Vincent ZOX:[id number] DOB: 1961-01-02 DOA: 02/04/2020  PCP: Avon Gully, MD  Admit date: 02/04/2020 Discharge date: 03/21/2020  Time spent: 40 minutes  Recommendations for Outpatient Follow-up:  1. Needs various anxiolytics which have been called in as below 2. Recommend DME nebulizer, home oxygen 3 L baseline, BiPAP with settings as per prior and will order home health on discharge 3. Recommend outpatient follow-up for feasibility of pulmonary embolus treatment in the next several weeks 4. Will need Chem-12, CBC in the outpatient setting 5. Recommend outpatient characterization of right renal cyst  Discharge Diagnoses:  Principal Problem:   Acute hypoxemic respiratory failure due to COVID-19 [facility]) Active Problems:   2019 novel coronavirus disease (COVID-19)   Hypertension   Type 2 diabetes mellitus (HCC)   Dyslipidemia   Depression   Class 1 obesity   Pulmonary emboli (HCC)   Discharge Condition: Improved  Diet recommendation: Diabetic heart healthy  Filed Weights   02/04/20 1838  Weight: 98.9 kg    History of present illness:  60 year old black female known history of depression HLD DM TY 2 BMI 45, HTN Admitted to [facility] initially with COVID-19-requiring 80% oxygen Despite receiving appropriate therapy decompensated needing high flow nasal cannula nonrebreather salter Found to have bilateral provoked PEs likely requiring 3 months long-term anticoagulation on CTA chest 02/12/2019 Brief ICU stay where she became lethargic and hypercarbic transferred to ICU CODE STATUS changed several times currently full code Found to have downtrending hemoglobin CT abdomen pelvis 2/4 showed hematoma pelvic muscles MRI confirmed the same-pulmonology IR reconsulted and decision made to hold off on further anticoagulation Placed on antibiotics for presumed bacterial pneumonia switched to linezolid blood cultures obtained 2/6 - superimposed  bacterial pneumonia COVID-19 infection  Hospital Course:  1. Secondary MRSA bacterial infection a. procalcitonin is low b. Completed 7-day course of antibiotics including vancomycin 2/9 c. Trend WBC as an outpatient 2. COVID-19-requiring less oxygen now than prior a. Completed all therapies other than steroids expect long taper of prednisone which will end in 2 days from discharge b. Continue Brovana 15 twice daily Pulmicort 0.5 twice daily Mucinex and albuterol every 4 as needed c. Mobilize as best possible-DME nebulizer has been ordered on discharge for the patient 3. Bilateral pulmonary embolism not on anticoagulation a. Received anticoagulation from 02/12/2020 until 03/15/2020 -see below discussion b. Not a good candidate for long-term anticoagulation 4. Large bilateral obturator hematoma a. Conferenced discussion with pulmonology and IR recommended holding blood thinner secondary to large hematoma that occurred b. Outpatient reeval c. Not a candidate for further anticoagulation currently 5. Severe anxiety a. Psychiatry reevaluated 2/7 b. No BZD therefore Remeron 15 at bedtime, gabapentin 100 twice daily Seroquel 25 twice daily c. More euthymic more pleasant less anxious but will need further outpatient follow-up 6. Sinus tachycardia a. Combination of probable PE and anxiety-symptomatic management b. Heart rate is improved 7. Right renal cyst on CT abdomen a. Outpatient further characterization in the outpatient setting 8. Undiagnosed OSA OHS a. Vent needs to be ordered versus BiPAP prior to discharge-Case management working on the same 9. DM TY 2 a. CBG ranging 130-180 b. Better controlled with lower dose of steroids c. Transition back to outpatient regimen of Metformin 10. Abnormal TSH   Procedures: Multiple Consultations:  Pulmonary  Discharge Exam: Vitals:   03/21/20 0807 03/21/20 1003  BP:  109/65  Pulse:  (!) 105  Resp:  (!) 33  Temp:  98.9 F (37.2 C)  SpO2:  97% 96%    General: Awake alert coherent no distress but quite anxious and moving around Cardiovascular: S1-S2 slight tachycardia 110 120 Respiratory: Clinically clear no rales no rhonchi Abdomen soft no rebound No lower extremity edema  Discharge Instructions   Discharge Instructions    Diet - low sodium heart healthy   Complete by: As directed    Discharge instructions   Complete by: As directed    You were diagnosed with coronavirus infection and developed a lung clot-unfortunately because you had some bleeding you are not really a good candidate for continuation of the blood thinner but we can mitigate this by making sure that you try to stay healthy and to various things such as ambulate and move around adequately at home You have been given numerous anxiety medications to help with your underlying anxiety and we will call them into the pharmacy In addition we have given you various medications for breathing and a nebulizer and you will get a breathing machine at home with settings as per the respiratory therapist and you will be on 3 L of oxygen at baseline Please follow-up with your physician in about a week notice changes to your other medications and good luck   For home use only DME Nebulizer machine   Complete by: As directed    Patient needs a nebulizer to treat with the following condition: COVID-19   Length of Need: 6 Months   Increase activity slowly   Complete by: As directed    No wound care   Complete by: As directed      Allergies as of 03/21/2020   No Known Allergies     Medication List    STOP taking these medications   escitalopram 20 MG tablet Commonly known as: LEXAPRO   losartan 50 MG tablet Commonly known as: COZAAR   MULTIVITAMIN ADULT PO   VITAMIN D PO     TAKE these medications   albuterol (2.5 MG/3ML) 0.083% nebulizer solution Commonly known as: PROVENTIL Take 3 mLs (2.5 mg total) by nebulization every 4 (four) hours as needed for  wheezing or shortness of breath.   atorvastatin 10 MG tablet Commonly known as: LIPITOR Take 10 mg by mouth daily.   budesonide 0.5 MG/2ML nebulizer solution Commonly known as: PULMICORT Take 2 mLs (0.5 mg total) by nebulization 2 (two) times daily.   chlorhexidine 0.12 % solution Commonly known as: PERIDEX 15 mLs by Mouth Rinse route 2 (two) times daily.   D3-50 1.25 MG (50000 UT) capsule Generic drug: Cholecalciferol Take 50,000 Units by mouth every 30 (thirty) days.   gabapentin 100 MG capsule Commonly known as: NEURONTIN Take 1 capsule (100 mg total) by mouth 2 (two) times daily.   guaiFENesin-codeine 100-10 MG/5ML syrup Take 10 mLs by mouth every 4 (four) hours as needed for cough.   hydrocortisone 2.5 % rectal cream Commonly known as: ANUSOL-HC Place rectally 2 (two) times daily as needed for hemorrhoids or anal itching.   lip balm ointment Apply topically as needed for lip care.   magic mouthwash Soln Take 15 mLs by mouth 3 (three) times daily as needed for mouth pain.   metFORMIN 850 MG tablet Commonly known as: GLUCOPHAGE Take 850 mg by mouth 2 (two) times daily.   mirtazapine 15 MG tablet Commonly known as: REMERON Take 1 tablet (15 mg total) by mouth at bedtime. What changed: how much to take   polyethylene glycol 17 g packet Commonly known as: MIRALAX / GLYCOLAX Take 17  g by mouth daily as needed for moderate constipation.   predniSONE 5 MG tablet Commonly known as: DELTASONE Take 1 tablet (5 mg total) by mouth daily with breakfast. Start taking on: March 22, 2020   QUEtiapine 25 MG tablet Commonly known as: SEROQUEL Take 1 tablet (25 mg total) by mouth 2 (two) times daily.   senna-docusate 8.6-50 MG tablet Commonly known as: Senokot-S Take 2 tablets by mouth 2 (two) times daily.   traZODone 50 MG tablet Commonly known as: DESYREL Take 1 tablet (50 mg total) by mouth at bedtime as needed for sleep.            Durable Medical  Equipment  (From admission, onward)         Start     Ordered   03/21/20 0000  For home use only DME Nebulizer machine       Question Answer Comment  Patient needs a nebulizer to treat with the following condition COVID-19   Length of Need 6 Months      03/21/20 1146   03/20/20 1317  For home use only DME Bipap  Once       Question Answer Comment  Length of Need Lifetime   Bleed in oxygen (LPM) 30 percent, settings 16/6 settings   Inspiratory pressure OTHER SEE COMMENTS   Expiratory pressure OTHER SEE COMMENTS      03/20/20 1317   03/18/20 1520  For home use only DME Walker rolling  Once       Question Answer Comment  Walker: With 5 Inch Wheels   Patient needs a walker to treat with the following condition Ambulatory dysfunction      03/18/20 1519   03/18/20 1520  For home use only DME 3 n 1  Once        03/18/20 1519   03/18/20 0700  For home use only DME oxygen  Once       Question Answer Comment  Length of Need 6 Months   Mode or (Route) Nasal cannula   Liters per Minute 4   Frequency Continuous (stationary and portable oxygen unit needed)   Oxygen conserving device Yes   Oxygen delivery system Gas      03/18/20 0615   03/17/20 0637  For home use only DME 3 n 1  Once        03/17/20 0636   03/17/20 0637  For home use only DME Tub bench  Once        03/17/20 0636         No Known Allergies  Follow-up Information    [facility] Follow up.   Specialty: Urgent Care Why: Therapy and medication management. Press option 1 to speak with someone in the outpatient clinic.  Contact information: [address] [place] [address] [phone]       Julio Sicks, NP. Call in 1 day(s).   Specialty: Pulmonary Disease Why: You have a post hospital follow up appointment with NP Parrett on 03/24/20 at [facility].  Please arrive 30 mins in advance. Contact information: [address] [place] [address] [phone]         [facility] Follow up.   Specialty: Home Health Services Why: Frances Furbish, will follow you at home with home health physical therapy and occupation therapy. Please call the above number if you have any questions or problems.  Contact information: [address] [place] [address] [phone]  [facility] Follow up.   Why: Your Oxygen and BIPAP will come from this company. Call the above number if you have any problems or concerns.  Contact information: [address] [place] [address] [phone]                The results of significant diagnostics from this hospitalization (including imaging, microbiology, ancillary and laboratory) are listed below for reference.    Significant Diagnostic Studies: CT ABDOMEN PELVIS WO CONTRAST  Result Date: 03/14/2020 CLINICAL DATA:  Respiratory failure. EXAM: CT CHEST, ABDOMEN AND PELVIS WITHOUT CONTRAST TECHNIQUE: Multidetector CT imaging of the chest, abdomen and pelvis was performed following the standard protocol without IV contrast. COMPARISON:  02/12/2020 FINDINGS: CT CHEST FINDINGS Cardiovascular: No significant vascular findings. Mildly enlarged heart size. No pericardial effusion. Mediastinum/Nodes: No enlarged mediastinal, hilar, or axillary lymph nodes. Thyroid gland, trachea, and esophagus demonstrate no significant findings. Lungs/Pleura: Patchy areas of peripheral airspace disease throughout the lungs. Mild diffuse ground-glass opacities throughout the lungs bilaterally. Interstitial thickening along the periphery of bilateral lungs most severe in the left upper and lower lobes likely reflecting an element of scarring. No pleural effusion or pneumothorax. Musculoskeletal: No acute osseous abnormality. CT ABDOMEN PELVIS FINDINGS Hepatobiliary: No focal liver abnormality is seen. No gallstones, gallbladder wall thickening, or biliary dilatation. Pancreas: Unremarkable. No pancreatic ductal  dilatation or surrounding inflammatory changes. Spleen: Normal in size without focal abnormality. Adrenals/Urinary Tract: Normal adrenal glands. 18 mm hypodense, fluid attenuating right interpolar renal mass consistent with a cyst. No urolithiasis or obstructive uropathy. Normal bladder. Stomach/Bowel: Stomach is within normal limits. Appendix appears normal. No evidence of bowel wall thickening, distention, or inflammatory changes. Large amount of stool throughout the colon with rectal fecal impaction. Vascular/Lymphatic: Normal caliber abdominal aorta with mild atherosclerosis. No lymphadenopathy. Reproductive: Bilateral adnexa are unremarkable. Small dystrophic calcifications in the posterior aspect of the uterus. Other: No abdominopelvic ascites.  Fat containing umbilical hernia. Musculoskeletal: No acute or significant osseous findings. Moderate osteoarthritis of bilateral SI joints. Expansion of the left obturator internus muscle which may reflect an intramuscular hematoma or infection. Mild expansion of the right operator internus muscle with a 17 mm low-attenuation focus within the muscle which may reflect a liquified intramuscular hematoma or abscess. Severe bilateral facet arthropathy at L3-4. IMPRESSION: 1. Patchy areas of peripheral airspace disease throughout the lungs bilaterally with mild diffuse ground-glass opacities throughout the lungs bilaterally. Differential considerations include persistent multilobar pneumonia including atypical viral pneumonia such as COVID-19 versus alveolitis. Interstitial thickening along the periphery of bilateral lungs most severe in the left upper and lower lobes likely reflecting an element of scarring. 2. Expansion of the left obturator internus muscle which may reflect an intramuscular hematoma or infection. Mild expansion of the right operator internus muscle with a 17 mm low-attenuation focus within the muscle which may reflect a liquified intramuscular hematoma  or abscess. Recommend further evaluation with an MRI of the pelvis without and with intravenous contrast. 3. Large amount of stool throughout the colon with rectal fecal impaction. 4. Aortic Atherosclerosis (ICD10-I70.0). Electronically Signed   By: Elige Ko   On: 03/14/2020 18:31   DG Abd 1 View  Result Date: 03/06/2020 CLINICAL DATA:  Follow-up ileus EXAM: ABDOMEN - 1 VIEW COMPARISON:  03/04/2020 FINDINGS: Nasogastric tube tip in the fundus of the stomach. Small bowel pattern is normal. Ordinary colonic gas pattern, with some gas in the transverse colon, often seen in debilitated supine patients. IMPRESSION: Nasogastric tube tip in the  fundus of the stomach. Unremarkable bowel gas pattern. See above. Electronically Signed   By: Paulina Fusi M.D.   On: 03/06/2020 08:34   CT CHEST WO CONTRAST  Result Date: 03/14/2020 CLINICAL DATA:  Respiratory failure. EXAM: CT CHEST, ABDOMEN AND PELVIS WITHOUT CONTRAST TECHNIQUE: Multidetector CT imaging of the chest, abdomen and pelvis was performed following the standard protocol without IV contrast. COMPARISON:  02/12/2020 FINDINGS: CT CHEST FINDINGS Cardiovascular: No significant vascular findings. Mildly enlarged heart size. No pericardial effusion. Mediastinum/Nodes: No enlarged mediastinal, hilar, or axillary lymph nodes. Thyroid gland, trachea, and esophagus demonstrate no significant findings. Lungs/Pleura: Patchy areas of peripheral airspace disease throughout the lungs. Mild diffuse ground-glass opacities throughout the lungs bilaterally. Interstitial thickening along the periphery of bilateral lungs most severe in the left upper and lower lobes likely reflecting an element of scarring. No pleural effusion or pneumothorax. Musculoskeletal: No acute osseous abnormality. CT ABDOMEN PELVIS FINDINGS Hepatobiliary: No focal liver abnormality is seen. No gallstones, gallbladder wall thickening, or biliary dilatation. Pancreas: Unremarkable. No pancreatic ductal  dilatation or surrounding inflammatory changes. Spleen: Normal in size without focal abnormality. Adrenals/Urinary Tract: Normal adrenal glands. 18 mm hypodense, fluid attenuating right interpolar renal mass consistent with a cyst. No urolithiasis or obstructive uropathy. Normal bladder. Stomach/Bowel: Stomach is within normal limits. Appendix appears normal. No evidence of bowel wall thickening, distention, or inflammatory changes. Large amount of stool throughout the colon with rectal fecal impaction. Vascular/Lymphatic: Normal caliber abdominal aorta with mild atherosclerosis. No lymphadenopathy. Reproductive: Bilateral adnexa are unremarkable. Small dystrophic calcifications in the posterior aspect of the uterus. Other: No abdominopelvic ascites.  Fat containing umbilical hernia. Musculoskeletal: No acute or significant osseous findings. Moderate osteoarthritis of bilateral SI joints. Expansion of the left obturator internus muscle which may reflect an intramuscular hematoma or infection. Mild expansion of the right operator internus muscle with a 17 mm low-attenuation focus within the muscle which may reflect a liquified intramuscular hematoma or abscess. Severe bilateral facet arthropathy at L3-4. IMPRESSION: 1. Patchy areas of peripheral airspace disease throughout the lungs bilaterally with mild diffuse ground-glass opacities throughout the lungs bilaterally. Differential considerations include persistent multilobar pneumonia including atypical viral pneumonia such as COVID-19 versus alveolitis. Interstitial thickening along the periphery of bilateral lungs most severe in the left upper and lower lobes likely reflecting an element of scarring. 2. Expansion of the left obturator internus muscle which may reflect an intramuscular hematoma or infection. Mild expansion of the right operator internus muscle with a 17 mm low-attenuation focus within the muscle which may reflect a liquified intramuscular hematoma  or abscess. Recommend further evaluation with an MRI of the pelvis without and with intravenous contrast. 3. Large amount of stool throughout the colon with rectal fecal impaction. 4. Aortic Atherosclerosis (ICD10-I70.0). Electronically Signed   By: Elige Ko   On: 03/14/2020 18:31   MR PELVIS W WO CONTRAST  Result Date: 03/15/2020 CLINICAL DATA:  60 year old female with expansion of operator musculatures seen on previous imaging. EXAM: MRI PELVIS WITHOUT AND WITH CONTRAST TECHNIQUE: Multiplanar multisequence MR imaging of the pelvis was performed both before and after administration of intravenous contrast. CONTRAST:  8mL GADAVIST GADOBUTROL 1 MMOL/ML IV SOLN COMPARISON:  CT chest, abdomen and pelvis dated March 14, 2020 FINDINGS: Urinary Tract: No distal ureteral dilation. Urinary bladder with smooth contour. Bowel: Abundant stool in the rectum. Signs of rectal distension without perirectal stranding. Visualized bowel loops proximal to this without signs of dilation. Normal appendix in the RIGHT lower quadrant. Vascular/Lymphatic: Patent pelvic  vasculature.  No adenopathy. Reproductive: Uterus with leiomyomas. Normal appearance of adnexal structures. Largest leiomyoma in the uterine fundus on the RIGHT measures 2 cm. Other: Edema in the musculature of the abductor compartment associated with hematomas in the bilateral obturator musculature, see below. Musculoskeletal: Moderately large hematomas in the bilateral obturator musculature, mixed signal on T2 and on T1. The LEFT side measuring 7.6 x 3.0 cm. The RIGHT-side measuring 6.2 x 3.0 cm. No sign of internal enhancement. Little or no peripheral enhancement and no signs of gas on prior CT. IMPRESSION: 1. Moderately large intramuscular hematomas in the bilateral obturator musculature with no sign of internal enhancement and only minimal peripheral enhancement at the interface with adjacent musculature. No specific findings to indicate infection. Correlate  with any clinical signs of infection and attention on follow-up. 2. Bilateral abductor musculature with edema versus mild myositis. Favor edema given findings on current scan. 3. Abundant stool in the rectum. Due to the volume of stool fecal impaction is considered. These results will be called to the ordering clinician or representative by the Radiologist Assistant, and communication documented in the PACS or [employer]. Electronically Signed   By: Donzetta Kohut M.D.   On: 03/15/2020 14:50   DG CHEST PORT 1 VIEW  Result Date: 03/13/2020 CLINICAL DATA:  COVID pneumonia, respiratory failure EXAM: PORTABLE CHEST 1 VIEW COMPARISON:  03/11/2020 FINDINGS: Lung volumes are small, but are symmetric and are stable since prior examination. Superimposed asymmetric pulmonary infiltrates largely diffusely throughout the left lung is again seen, likely infectious. No pneumothorax or pleural effusion. Cardiac size within normal limits. IMPRESSION: Stable examination. Pulmonary hypoinflation. Asymmetric diffuse pulmonary infiltrate predominantly within the left lung, likely infectious. Electronically Signed   By: Helyn Numbers MD   On: 03/13/2020 11:41   DG CHEST PORT 1 VIEW  Result Date: 03/11/2020 CLINICAL DATA:  Acute hypoxemic respiratory failure due to COVID 19. EXAM: PORTABLE CHEST 1 VIEW COMPARISON:  March 02, 2020. FINDINGS: The heart size and mediastinal contours are within normal limits. No pneumothorax or pleural effusion is noted. Patchy airspace opacities are noted throughout both lungs consistent with multifocal pneumonia. The visualized skeletal structures are unremarkable. IMPRESSION: Patchy bilateral lung opacities are noted consistent with multifocal pneumonia. Followup radiographs are recommended until resolution. Electronically Signed   By: Lupita Raider M.D.   On: 03/11/2020 15:54   DG CHEST PORT 1 VIEW  Result Date: 03/02/2020 CLINICAL DATA:  60 year old female with tachypnea and acute  respiratory failure. EXAM: PORTABLE CHEST 1 VIEW COMPARISON:  Chest radiograph dated 02/28/2020. FINDINGS: Interval worsening of bilateral pulmonary infiltrates compared to prior radiograph. No large pleural effusion no pneumothorax. Stable cardiac silhouette. No acute osseous pathology. IMPRESSION: Interval worsening of the bilateral pulmonary infiltrates compared to prior radiograph. Electronically Signed   By: Elgie Collard M.D.   On: 03/02/2020 17:03   DG CHEST PORT 1 VIEW  Result Date: 02/28/2020 CLINICAL DATA:  Hypoxia.  Respiratory failure.  COVID. EXAM: PORTABLE CHEST 1 VIEW COMPARISON:  02/26/2020. FINDINGS: Heart size stable. Diffuse severe bilateral pulmonary infiltrates are again noted. No significant interim change. No pleural effusion or pneumothorax. Degenerative changes scoliosis thoracic spine. IMPRESSION: Diffuse severe bilateral pulmonary infiltrates are again noted. No significant interim change. Electronically Signed   By: Maisie Fus  Register   On: 02/28/2020 12:50   DG CHEST PORT 1 VIEW  Result Date: 02/26/2020 CLINICAL DATA:  Dyspnea, COVID with increased shortness of breath EXAM: PORTABLE CHEST 1 VIEW COMPARISON:  February 21, 2020 FINDINGS:  Lung volumes are diminished with buckling of the trachea as result. Image rotated to the RIGHT. Cardiomediastinal contours are stable. Interstitial and airspace disease with similar appearance in the RIGHT chest accounting for rotation compared to February 21, 2020. Potential slight improvement in the LEFT chest. On limited assessment no acute skeletal process. IMPRESSION: Diminished lung volumes compared to the prior study with potential slight interval improvement in the LEFT chest, otherwise similar appearance of diffuse interstitial and airspace disease in this patient with COVID-19 pneumonia. Electronically Signed   By: Donzetta Kohut M.D.   On: 02/26/2020 13:57   DG CHEST PORT 1 VIEW  Result Date: 02/21/2020 CLINICAL DATA:  60 year old  female COVID-19. Bilateral pulmonary emboli and respiratory failure. EXAM: PORTABLE CHEST 1 VIEW COMPARISON:  CTA chest 02/12/2020 and earlier. FINDINGS: Portable AP semi upright view at 0507 hours. Lower lung volumes with confluent bilateral pulmonary interstitial and ground-glass opacity. The left lung apex is most spared. Mediastinal contours remain within normal limits. No pneumothorax or pleural effusion is evident. Ventilation not significantly changed from the prior CTA. Visualized tracheal air column is within normal limits. No acute osseous abnormality identified. IMPRESSION: Extensive bilateral opacity compatible with COVID-19 pneumonia. Ventilation not improved from the 02/12/2020 CTA. Electronically Signed   By: Odessa Fleming M.D.   On: 02/21/2020 06:03   DG Abd Portable 1V  Result Date: 03/04/2020 CLINICAL DATA:  NG tube placement EXAM: PORTABLE ABDOMEN - 1 VIEW COMPARISON:  March 04, 2020 FINDINGS: The enteric tube projects over the gastric body. There is gaseous distention of loops of bowel throughout the abdomen, favored to represent mostly colon. There is no definite pneumatosis or free air involving the visualized portions of the abdomen. IMPRESSION: Enteric tube projects over the gastric body. Electronically Signed   By: Katherine Mantle M.D.   On: 03/04/2020 02:21   DG Abd Portable 1V  Result Date: 03/04/2020 CLINICAL DATA:  Abdominal distension and pain EXAM: PORTABLE ABDOMEN - 1 VIEW COMPARISON:  None. FINDINGS: Gas distended colon. Gas throughout the small bowel without visible dilatation. Bibasilar interstitial opacities. IMPRESSION: Gas distended colon and small bowel. No visible small bowel dilatation. Electronically Signed   By: Deatra Robinson M.D.   On: 03/04/2020 00:48   ECHOCARDIOGRAM COMPLETE  Result Date: 03/04/2020    ECHOCARDIOGRAM REPORT   Patient Name:   Christina Vincent Date of Exam: 03/04/2020 Medical Rec #:  [id number]      Height:       68.0 in Accession #:    [id number]      Weight:       218.0 lb Date of Birth:  14-Feb-1960       BSA:          2.120 m Patient Age:    59 years       BP:           99/53 mmHg Patient Gender: F              HR:           122 bpm. Exam Location:  Inpatient Procedure: 2D Echo, Color Doppler, Cardiac Doppler and Intracardiac            Opacification Agent Indications:    Acutre respiratory distress R06.03  History:        Patient has no prior history of Echocardiogram examinations.                 Risk Factors:Dyslipidemia.  Sonographer:    Maralyn Sago  Pirrotta RDCS Referring Phys: [id number] MURALI RAMASWAMY IMPRESSIONS  1. Technically difficult study; definity used.  2. Left ventricular ejection fraction, by estimation, is 60 to 65%. The left ventricle has normal function. The left ventricle has no regional wall motion abnormalities. Left ventricular diastolic parameters are consistent with Grade I diastolic dysfunction (impaired relaxation).  3. Right ventricular systolic function is normal. The right ventricular size is normal. Tricuspid regurgitation signal is inadequate for assessing PA pressure.  4. The mitral valve is normal in structure. No evidence of mitral valve regurgitation. No evidence of mitral stenosis.  5. The aortic valve is normal in structure. Aortic valve regurgitation is not visualized. No aortic stenosis is present.  6. The inferior vena cava is normal in size with greater than 50% respiratory variability, suggesting right atrial pressure of 3 mmHg. FINDINGS  Left Ventricle: Left ventricular ejection fraction, by estimation, is 60 to 65%. The left ventricle has normal function. The left ventricle has no regional wall motion abnormalities. Definity contrast agent was given IV to delineate the left ventricular  endocardial borders. The left ventricular internal cavity size was normal in size. There is no left ventricular hypertrophy. Left ventricular diastolic parameters are consistent with Grade I diastolic dysfunction (impaired relaxation). Right  Ventricle: The right ventricular size is normal. Right ventricular systolic function is normal. Tricuspid regurgitation signal is inadequate for assessing PA pressure. The tricuspid regurgitant velocity is 1.40 m/s, and with an assumed right atrial  pressure of 3 mmHg, the estimated right ventricular systolic pressure is 10.8 mmHg. Left Atrium: Left atrial size was normal in size. Right Atrium: Right atrial size was normal in size. Pericardium: There is no evidence of pericardial effusion. Mitral Valve: The mitral valve is normal in structure. No evidence of mitral valve regurgitation. No evidence of mitral valve stenosis. Tricuspid Valve: The tricuspid valve is normal in structure. Tricuspid valve regurgitation is trivial. No evidence of tricuspid stenosis. Aortic Valve: The aortic valve is normal in structure. Aortic valve regurgitation is not visualized. No aortic stenosis is present. Pulmonic Valve: The pulmonic valve was not well visualized. Pulmonic valve regurgitation is not visualized. No evidence of pulmonic stenosis. Aorta: The aortic root is normal in size and structure. Venous: The inferior vena cava is normal in size with greater than 50% respiratory variability, suggesting right atrial pressure of 3 mmHg.  Additional Comments: Technically difficult study; definity used.  LEFT VENTRICLE PLAX 2D LVIDd:         3.10 cm  Diastology LVIDs:         2.20 cm  LV e' medial:    4.35 cm/s LV PW:         1.10 cm  LV E/e' medial:  12.4 LV IVS:        1.10 cm  LV e' lateral:   6.74 cm/s LVOT diam:     1.90 cm  LV E/e' lateral: 8.0 LV SV:         35 LV SV Index:   16 LVOT Area:     2.84 cm  RIGHT VENTRICLE TAPSE (M-mode): 1.2 cm LEFT ATRIUM             Index       RIGHT ATRIUM           Index LA diam:        2.40 cm 1.13 cm/m  RA Area:     10.80 cm LA Vol (A2C):   22.3 ml 10.52 ml/m RA Volume:   20.70  ml  9.76 ml/m LA Vol (A4C):   22.5 ml 10.61 ml/m LA Biplane Vol: 23.9 ml 11.27 ml/m  AORTIC VALVE LVOT Vmax:    90.00 cm/s LVOT Vmean:  63.600 cm/s LVOT VTI:    0.122 m  AORTA Ao Root diam: 3.50 cm Ao Asc diam:  3.10 cm MITRAL VALVE               TRICUSPID VALVE MV Area (PHT): 2.43 cm    TR Peak grad:   7.8 mmHg MV Decel Time: 312 msec    TR Vmax:        140.00 cm/s MV E velocity: 53.80 cm/s MV A velocity: 83.40 cm/s  SHUNTS MV E/A ratio:  0.65        Systemic VTI:  0.12 m                            Systemic Diam: 1.90 cm Olga Millers MD Electronically signed by Olga Millers MD Signature Date/Time: 03/04/2020/4:38:19 PM    Final     Microbiology: Recent Results (from the past 240 hour(s))  Expectorated sputum assessment w rflx to resp cult     Status: None   Collection Time: 03/13/20  4:42 PM   Specimen: Expectorated Sputum  Result Value Ref Range Status   Specimen Description EXPECTORATED SPUTUM  Final   Special Requests NONE  Final   Sputum evaluation   Final    THIS SPECIMEN IS ACCEPTABLE FOR SPUTUM CULTURE Performed at [facility], [address], [place], [place] [address]    Report Status 03/13/2020 FINAL  Final  Culture, respiratory     Status: None   Collection Time: 03/13/20  4:42 PM  Result Value Ref Range Status   Specimen Description   Final    EXPECTORATED SPUTUM Performed at [facility], [address] [place], [place] [address]    Special Requests   Final    NONE Reflexed from [id number] Performed at [facility], [address], [place], [place] [address]    Gram Stain   Final    FEW WBC PRESENT, PREDOMINANTLY PMN MODERATE GRAM POSITIVE COCCI IN PAIRS IN CLUSTERS MODERATE GRAM POSITIVE RODS Performed at [facility], [address] [place], [place] [address]    Culture   Final    ABUNDANT METHICILLIN RESISTANT STAPHYLOCOCCUS AUREUS   Report Status 03/16/2020 FINAL  Final   Organism ID, Bacteria METHICILLIN RESISTANT STAPHYLOCOCCUS AUREUS  Final      Susceptibility   Methicillin resistant staphylococcus  aureus - MIC*    CIPROFLOXACIN >=8 RESISTANT Resistant     ERYTHROMYCIN >=8 RESISTANT Resistant     GENTAMICIN <=0.5 SENSITIVE Sensitive     OXACILLIN >=4 RESISTANT Resistant     TETRACYCLINE <=1 SENSITIVE Sensitive     VANCOMYCIN 1 SENSITIVE Sensitive     TRIMETH/SULFA <=10 SENSITIVE Sensitive     CLINDAMYCIN <=0.25 SENSITIVE Sensitive     RIFAMPIN <=0.5 SENSITIVE Sensitive     Inducible Clindamycin NEGATIVE Sensitive     * ABUNDANT METHICILLIN RESISTANT STAPHYLOCOCCUS AUREUS  Culture, blood (routine x 2)     Status: None (Preliminary result)   Collection Time: 03/16/20  6:16 PM   Specimen: BLOOD  Result Value Ref Range Status   Specimen Description   Final    BLOOD RIGHT ANTECUBITAL Performed at [facility], [address] [place], [place] [address]  Special Requests   Final    BOTTLES DRAWN AEROBIC ONLY Blood Culture adequate volume Performed at [facility], [address] [place], [place] [address]    Culture   Final    NO GROWTH 4 DAYS Performed at [facility], [address] [place], [place] [address]    Report Status PENDING  Incomplete  Culture, blood (routine x 2)     Status: None (Preliminary result)   Collection Time: 03/16/20  6:16 PM   Specimen: BLOOD  Result Value Ref Range Status   Specimen Description   Final    BLOOD RIGHT WRIST Performed at [facility], [address] [place], [place] [address]    Special Requests   Final    BOTTLES DRAWN AEROBIC ONLY Blood Culture adequate volume Performed at [facility], [address] [place], [place] [address]    Culture   Final    NO GROWTH 4 DAYS Performed at [facility], [address] [place], [place] [address]    Report Status PENDING  Incomplete  MRSA PCR Screening     Status: Abnormal   Collection Time: 03/16/20  6:30 PM   Specimen: Nasal Mucosa; Nasopharyngeal  Result Value Ref Range Status   MRSA by PCR  POSITIVE (A) NEGATIVE Final    Comment:        The GeneXpert MRSA Assay (FDA approved for NASAL specimens only), is one component of a comprehensive MRSA colonization surveillance program. It is not intended to diagnose MRSA infection nor to guide or monitor treatment for MRSA infections. RESULT CALLED TO, READ BACK BY AND VERIFIED WITH: NGUYEN,TILDA 03/16/20 @2144  BY SEEL,MOLLY Performed at [facility], [address] [place], [place] [address]      Labs: Basic Metabolic Panel: Recent Labs  Lab 03/15/20 0105 03/17/20 0513 03/18/20 0814 03/19/20 0317 03/20/20 0518  NA 138 138 139 140 141  K 4.4 4.3 3.8 4.0 3.7  CL 89* 92* 97* 97* 99  CO2 39* 38* 33* 35* 32  GLUCOSE 146* 133* 145* 120* 119*  BUN 10 9 6 6  5*  CREATININE 0.47 0.37* 0.35* <0.30* 0.42*  CALCIUM 8.9 8.7* 8.1* 8.8* 8.5*  MG 2.1 2.3 1.9 2.2 2.2  PHOS 4.3  --  3.3 4.2  --    Liver Function Tests: Recent Labs  Lab 03/15/20 0105 03/17/20 0513 03/18/20 0814 03/19/20 0317 03/20/20 0518  AST 32 20 18 19 19   ALT 67* 51* 41 39 32  ALKPHOS 66 71 67 72 65  BILITOT 0.7 0.8 0.7 1.0 1.0  PROT 5.9* 5.9* 5.5* 6.0* 5.5*  ALBUMIN 2.8* 2.8* 2.7* 3.0* 2.8*   No results for input(s): LIPASE, AMYLASE in the last 168 hours. No results for input(s): AMMONIA in the last 168 hours. CBC: Recent Labs  Lab 03/17/20 0513 03/18/20 0814 03/18/20 1628 03/19/20 0317 03/20/20 0518  WBC 13.0* 14.2* 16.7* 12.6* 10.3  NEUTROABS 10.1* 11.8* 15.5* 10.0* 7.8*  HGB 8.4* 9.0* 9.4* 8.8* 8.8*  HCT 28.1* 29.9* 30.6* 29.0* 30.0*  MCV 99.6 100.7* 100.7* 100.3* 102.4*  PLT 350 384 406* 386 360   Cardiac Enzymes: No results for input(s): CKTOTAL, CKMB, CKMBINDEX, TROPONINI in the last 168 hours. BNP: BNP (last 3 results) Recent Labs    03/13/20 0434  BNP 50.5    ProBNP (last 3 results) No results for input(s): PROBNP in the last 8760 hours.  CBG: Recent Labs  Lab 03/20/20 0736 03/20/20  1229  03/20/20 1739 03/20/20 2029 03/21/20 0741  GLUCAP 132* 181* 139* 148* 127*       Signed:  Rhetta Mura MD   [facility] 03/21/2020, 11:46 AM

## 2020-03-21 NOTE — Progress Notes (Incomplete)
Pt discharge to home with spouse and daughter. D/C instructions reviewed with pt and family. Daughter able to demonstrate and verbalize use of equipment, O2 tanks and nebulizer equipment. Prescriptions given to pt, daughter took prescriptions to pharmacy to submit for filling. Addressed questions with daughter and husband. Pt remain on O2 during transport

## 2020-03-21 NOTE — Care Management (Signed)
Patient continues to exhibit signs of hypercapnia associated with morbid obesity causing OHS and a restriction to the thoracic cage.  Interruption or failure to provide NIMV would quickly lead to exacerbation of the patient's condition, hospital admission, and likely harm to the patient. Continued use is preferred.  The use of the NIMV will treat patient's high PC02 levels, FVC, and FEV1 deficiencies and can reduce risk of exacerbations and future hospitalizations when used at night and during the day.  BiPAP has been considered and ruled out as ineffective in providing adequate ventilation to control the patient's hypercapnia.   PT will be required to have frequent durations of NIV with AVAPS-AE or iVAPS-AE and any interruption could lead to a decline in the patient's condition requiring readmission.

## 2020-03-21 NOTE — Plan of Care (Signed)
  Problem: Clinical Measurements: Goal: Ability to maintain clinical measurements within normal limits will improve Outcome: Progressing   Problem: Activity: Goal: Risk for activity intolerance will decrease Outcome: Progressing   Problem: Coping: Goal: Level of anxiety will decrease Outcome: Progressing   

## 2020-03-21 NOTE — Progress Notes (Signed)
RT NOTE:  Pt taken off BiPAP this morning and placed on 3L [place]. Pt tolerating this well at this time with saturations of 95-97%. Pt states she feels good this morning. RT will continue to monitor pt status.

## 2020-03-22 LAB — CULTURE, BLOOD (ROUTINE X 2)
Culture: NO GROWTH
Culture: NO GROWTH
Special Requests: ADEQUATE
Special Requests: ADEQUATE

## 2020-03-24 ENCOUNTER — Ambulatory Visit: Payer: 59 | Admitting: Adult Health

## 2020-03-24 ENCOUNTER — Encounter: Payer: Self-pay | Admitting: Adult Health

## 2020-03-24 ENCOUNTER — Other Ambulatory Visit: Payer: Self-pay

## 2020-03-24 VITALS — BP 90/56 | HR 122 | Temp 97.4°F

## 2020-03-24 DIAGNOSIS — U071 COVID-19: Secondary | ICD-10-CM

## 2020-03-24 DIAGNOSIS — J9612 Chronic respiratory failure with hypercapnia: Secondary | ICD-10-CM | POA: Insufficient documentation

## 2020-03-24 DIAGNOSIS — J9611 Chronic respiratory failure with hypoxia: Secondary | ICD-10-CM | POA: Diagnosis not present

## 2020-03-24 DIAGNOSIS — D649 Anemia, unspecified: Secondary | ICD-10-CM | POA: Insufficient documentation

## 2020-03-24 DIAGNOSIS — J1282 Pneumonia due to coronavirus disease 2019: Secondary | ICD-10-CM

## 2020-03-24 DIAGNOSIS — I2699 Other pulmonary embolism without acute cor pulmonale: Secondary | ICD-10-CM

## 2020-03-24 DIAGNOSIS — R5381 Other malaise: Secondary | ICD-10-CM

## 2020-03-24 MED ORDER — BENZONATATE 200 MG PO CAPS: 200.0000 mg | ORAL_CAPSULE | Freq: Three times a day (TID) | ORAL | 1 refills | Status: DC | PRN

## 2020-03-24 NOTE — Assessment & Plan Note (Signed)
Prolonged hospitalization from COVID-19 with multiple complications including Covid pneumonia.  Patient has completed antibiotics and steroids.  She is continue on her pulmonary hygiene regimen.   We will check chest x-ray in 2 weeks on return  Plan  Patient Instructions  Continue on Pulmicort neb Twice daily   Albuterol neb As needed   Continue on BIPAP At bedtime with Oxygen 3l/m  And with naps.  Mucinex DM Twice daily  Liquid As needed  Cough  Tessalon Three times a day  As needed  Cough . Continue on Oxygen 3l/m rest and 4l/m activity  PT as planned.  Refer to Hematology .  Labs today  Follow up with Dr. Melida Quitter or Briscoe Daniello NP in 2 weeks with chest xray and As needed   Please contact office for sooner follow up if symptoms do not improve or worsen or seek emergency care

## 2020-03-24 NOTE — Assessment & Plan Note (Signed)
Patient had a prolonged hospitalization over the last 2 months.  She has significant physical deconditioning and activity intolerance.  She is to begin physical therapy at home this week.

## 2020-03-24 NOTE — Progress Notes (Signed)
@Patient  ID: Christina Vincent, female    DOB: 1960/05/14, 60 y.o.   MRN: [id number]  Chief Complaint  Patient presents with  . Hospitalization Follow-up    Referring provider: Avon Gully, MD  HPI: 60 year old female with multiple medical problems including diabetes, obesity, hypertension, hyperlipidemia  was seen during during hospitalization for critical illness with COVID-27 January 2020 .  Patient had prolonged hospitalization from December 27 through March 21, 2020 for acute respiratory failure secondary to COVID-19, she had a very complicated hospital stay, with bilateral PEs, severe anemia, , MRSA pneumonia, large bilateral obturator hematoma, severe anxiety, severe anxiety, undiagnosed OSA/OHS  TEST/EVENTS :  2D echo March 04, 2020 EF 60 to 65%, grade 1 diastolic dysfunction normal RV size and systolic function.  03/24/2020 Post Hospital follow up: COVID-19, Covid pneumonia, chronic hypoxic respiratory failure Patient returns for a post hospital follow-up.  Patient has had a prolonged hospitalization for COVID-19.  She was admitted December 27 found to have COVID-19 infection.  She was admitted with acute respiratory failure and hypoxemia.  She was treated with IV steroids, remdesivir and 14 days of Baricitinib and and oxygen.  Patient continued to have progressive shortness of breath and respiratory failure.  CT on January 4 showed bilateral PE.  She was started on anticoagulation therapy, she did require BiPAP support. During hospital stay she had anemia secondary to a large obturator muscle hematoma.  Her anticoagulation was stopped.  She had previously been on full dose Lovenox.  She had multifocal pneumonia.  She was treated with aggressive antibiotics.  Sputum culture showed MRSA.  She has significant anxiety.  Initially required high flow oxygen.  Throughout the course of the hospital stay she was slowly decreasing her oxygen demands.  At discharge she required 3 L at rest and  4 L with activity. CT chest March 14, 2020 showed patchy areas of peripheral airspace disease throughout the lungs.  Mild diffuse groundglass opacity throughout the lungs bilaterally and interstitial thickening along the periphery of the bilateral lungs most severe in the left upper and lower lobes likely an element of scarring Since discharge 3 days ago patient is feeling she is accompanied by her family members.  Has a lot of anxiety.   She is currently on oxygen 3l/m rest and 4l/m activity .  O2 saturations have been greater than 90%. She has been weaned off of prednisone. She is on Pulmicort  Twice daily . Uses albuterol neb as needed.  PT has been ordered but not started yet.  Prior to admission no lung problems. Never smoker . No hx of asthma or COPD .  Was active prior to admission , walked on regular basis .  Has PCP , but no f/up yet.  Patient is very weak has low energy gets winded with minimum activity.  And just feels that she is generally weak all over.  She denies any productive cough with thick mucus.  Does have a daily minimally productive dry cough.    No Known Allergies   There is no immunization history on file for this patient.  Past Medical History:  Diagnosis Date  . Depression   . Dyslipidemia     Tobacco History: Social History   Tobacco Use  Smoking Status Former Smoker  . Packs/day: 1.00  . Years: 8.00  . Pack years: 8.00  . Quit date: 03/24/1986  . Years since quitting: 34.0  Smokeless Tobacco Never Used  Tobacco Comment   quit 1988   Counseling  given: Not Answered Comment: quit 1988   Outpatient Medications Prior to Visit  Medication Sig Dispense Refill  . albuterol (PROVENTIL) (2.5 MG/3ML) 0.083% nebulizer solution Take 3 mLs (2.5 mg total) by nebulization every 4 (four) hours as needed for wheezing or shortness of breath. 75 mL 12  . atorvastatin (LIPITOR) 10 MG tablet Take 10 mg by mouth daily.    . budesonide (PULMICORT) 0.5 MG/2ML  nebulizer solution Take 2 mLs (0.5 mg total) by nebulization 2 (two) times daily. 2 mL 12  . chlorhexidine (PERIDEX) 0.12 % solution 15 mLs by Mouth Rinse route 2 (two) times daily. 120 mL 0  . D3-50 1.25 MG (50000 UT) capsule Take 50,000 Units by mouth every 30 (thirty) days.    Marland Kitchen. gabapentin (NEURONTIN) 100 MG capsule Take 1 capsule (100 mg total) by mouth 2 (two) times daily. 60 capsule 1  . guaiFENesin-codeine 100-10 MG/5ML syrup Take 10 mLs by mouth every 4 (four) hours as needed for cough. 120 mL 0  . hydrocortisone (ANUSOL-HC) 2.5 % rectal cream Place rectally 2 (two) times daily as needed for hemorrhoids or anal itching. 30 g 0  . lip balm (CARMEX) ointment Apply topically as needed for lip care. 7 g 0  . magic mouthwash SOLN Take 15 mLs by mouth 3 (three) times daily as needed for mouth pain. 15 mL 0  . metFORMIN (GLUCOPHAGE) 850 MG tablet Take 850 mg by mouth 2 (two) times daily.    . mirtazapine (REMERON) 15 MG tablet Take 1 tablet (15 mg total) by mouth at bedtime. 30 tablet 2  . polyethylene glycol (MIRALAX / GLYCOLAX) 17 g packet Take 17 g by mouth daily as needed for moderate constipation. 14 each 0  . predniSONE (DELTASONE) 5 MG tablet Take 1 tablet (5 mg total) by mouth daily with breakfast. 2 tablet 0  . QUEtiapine (SEROQUEL) 25 MG tablet Take 1 tablet (25 mg total) by mouth 2 (two) times daily. 60 tablet 2  . senna-docusate (SENOKOT-S) 8.6-50 MG tablet Take 2 tablets by mouth 2 (two) times daily. 30 tablet 0  . traZODone (DESYREL) 50 MG tablet Take 1 tablet (50 mg total) by mouth at bedtime as needed for sleep. 30 tablet 2   No facility-administered medications prior to visit.     Review of Systems:   Constitutional:   No  weight loss, night sweats,  Fevers, chills,  +fatigue, or  lassitude.  HEENT:   No headaches,  Difficulty swallowing,  Tooth/dental problems, or  Sore throat,                No sneezing, itching, ear ache, nasal congestion, post nasal drip,   CV:  No  chest pain,  Orthopnea, PND, swelling in lower extremities, anasarca, dizziness, palpitations, syncope.   GI  No heartburn, indigestion, abdominal pain, nausea, vomiting, diarrhea, change in bowel habits, loss of appetite, bloody stools.   Resp:    No chest wall deformity  Skin: no rash or lesions.  GU: no dysuria, change in color of urine, no urgency or frequency.  No flank pain, no hematuria   MS:  No joint pain or swelling.  No decreased range of motion.  No back pain.    Physical Exam  BP (!) 90/56 (BP Location: Right Arm, Cuff Size: Normal)   Pulse (!) 122   Temp (!) 97.4 F (36.3 C) (Temporal)   SpO2 93%   GEN: A/Ox3; pleasant , NAD, well nourished    HEENT:  [place]/AT,  NOSE-clear, THROAT-clear, no lesions, no postnasal drip or exudate noted.   NECK:  Supple w/ fair ROM; no JVD; normal carotid impulses w/o bruits; no thyromegaly or nodules palpated; no lymphadenopathy.    RESP  Clear  P & A; w/o, wheezes/ rales/ or rhonchi. no accessory muscle use, no dullness to percussion  CARD:  RRR, no m/r/g, no peripheral edema, pulses intact, no cyanosis or clubbing.  GI:   Soft & nt; nml bowel sounds; no organomegaly or masses detected.   Musco: Warm bil, no deformities or joint swelling noted.   Neuro: alert, no focal deficits noted.    Skin: Warm, no lesions or rashes    Lab Results:  CBC    Component Value Date/Time   WBC 10.3 03/20/2020 0518   RBC 2.93 (L) 03/20/2020 0518   HGB 8.8 (L) 03/20/2020 0518   HCT 30.0 (L) 03/20/2020 0518   PLT 360 03/20/2020 0518   MCV 102.4 (H) 03/20/2020 0518   MCH 30.0 03/20/2020 0518   MCHC 29.3 (L) 03/20/2020 0518   RDW 19.1 (H) 03/20/2020 0518   LYMPHSABS 1.8 03/20/2020 0518   MONOABS 0.6 03/20/2020 0518   EOSABS 0.1 03/20/2020 0518   BASOSABS 0.0 03/20/2020 0518    BMET    Component Value Date/Time   NA 141 03/20/2020 0518   K 3.7 03/20/2020 0518   CL 99 03/20/2020 0518   CO2 32 03/20/2020 0518   GLUCOSE 119 (H)  03/20/2020 0518   BUN 5 (L) 03/20/2020 0518   CREATININE 0.42 (L) 03/20/2020 0518   CALCIUM 8.5 (L) 03/20/2020 0518   GFRNONAA >60 03/20/2020 0518    BNP    Component Value Date/Time   BNP 50.5 03/13/2020 0434    ProBNP No results found for: PROBNP  Imaging: CT ABDOMEN PELVIS WO CONTRAST  Result Date: 03/14/2020 CLINICAL DATA:  Respiratory failure. EXAM: CT CHEST, ABDOMEN AND PELVIS WITHOUT CONTRAST TECHNIQUE: Multidetector CT imaging of the chest, abdomen and pelvis was performed following the standard protocol without IV contrast. COMPARISON:  02/12/2020 FINDINGS: CT CHEST FINDINGS Cardiovascular: No significant vascular findings. Mildly enlarged heart size. No pericardial effusion. Mediastinum/Nodes: No enlarged mediastinal, hilar, or axillary lymph nodes. Thyroid gland, trachea, and esophagus demonstrate no significant findings. Lungs/Pleura: Patchy areas of peripheral airspace disease throughout the lungs. Mild diffuse ground-glass opacities throughout the lungs bilaterally. Interstitial thickening along the periphery of bilateral lungs most severe in the left upper and lower lobes likely reflecting an element of scarring. No pleural effusion or pneumothorax. Musculoskeletal: No acute osseous abnormality. CT ABDOMEN PELVIS FINDINGS Hepatobiliary: No focal liver abnormality is seen. No gallstones, gallbladder wall thickening, or biliary dilatation. Pancreas: Unremarkable. No pancreatic ductal dilatation or surrounding inflammatory changes. Spleen: Normal in size without focal abnormality. Adrenals/Urinary Tract: Normal adrenal glands. 18 mm hypodense, fluid attenuating right interpolar renal mass consistent with a cyst. No urolithiasis or obstructive uropathy. Normal bladder. Stomach/Bowel: Stomach is within normal limits. Appendix appears normal. No evidence of bowel wall thickening, distention, or inflammatory changes. Large amount of stool throughout the colon with rectal fecal impaction.  Vascular/Lymphatic: Normal caliber abdominal aorta with mild atherosclerosis. No lymphadenopathy. Reproductive: Bilateral adnexa are unremarkable. Small dystrophic calcifications in the posterior aspect of the uterus. Other: No abdominopelvic ascites.  Fat containing umbilical hernia. Musculoskeletal: No acute or significant osseous findings. Moderate osteoarthritis of bilateral SI joints. Expansion of the left obturator internus muscle which may reflect an intramuscular hematoma or infection. Mild expansion of the right operator internus muscle with a 17  mm low-attenuation focus within the muscle which may reflect a liquified intramuscular hematoma or abscess. Severe bilateral facet arthropathy at L3-4. IMPRESSION: 1. Patchy areas of peripheral airspace disease throughout the lungs bilaterally with mild diffuse ground-glass opacities throughout the lungs bilaterally. Differential considerations include persistent multilobar pneumonia including atypical viral pneumonia such as COVID-19 versus alveolitis. Interstitial thickening along the periphery of bilateral lungs most severe in the left upper and lower lobes likely reflecting an element of scarring. 2. Expansion of the left obturator internus muscle which may reflect an intramuscular hematoma or infection. Mild expansion of the right operator internus muscle with a 17 mm low-attenuation focus within the muscle which may reflect a liquified intramuscular hematoma or abscess. Recommend further evaluation with an MRI of the pelvis without and with intravenous contrast. 3. Large amount of stool throughout the colon with rectal fecal impaction. 4. Aortic Atherosclerosis (ICD10-I70.0). Electronically Signed   By: Elige Ko   On: 03/14/2020 18:31   DG Abd 1 View  Result Date: 03/06/2020 CLINICAL DATA:  Follow-up ileus EXAM: ABDOMEN - 1 VIEW COMPARISON:  03/04/2020 FINDINGS: Nasogastric tube tip in the fundus of the stomach. Small bowel pattern is normal. Ordinary  colonic gas pattern, with some gas in the transverse colon, often seen in debilitated supine patients. IMPRESSION: Nasogastric tube tip in the fundus of the stomach. Unremarkable bowel gas pattern. See above. Electronically Signed   By: Paulina Fusi M.D.   On: 03/06/2020 08:34   CT CHEST WO CONTRAST  Result Date: 03/14/2020 CLINICAL DATA:  Respiratory failure. EXAM: CT CHEST, ABDOMEN AND PELVIS WITHOUT CONTRAST TECHNIQUE: Multidetector CT imaging of the chest, abdomen and pelvis was performed following the standard protocol without IV contrast. COMPARISON:  02/12/2020 FINDINGS: CT CHEST FINDINGS Cardiovascular: No significant vascular findings. Mildly enlarged heart size. No pericardial effusion. Mediastinum/Nodes: No enlarged mediastinal, hilar, or axillary lymph nodes. Thyroid gland, trachea, and esophagus demonstrate no significant findings. Lungs/Pleura: Patchy areas of peripheral airspace disease throughout the lungs. Mild diffuse ground-glass opacities throughout the lungs bilaterally. Interstitial thickening along the periphery of bilateral lungs most severe in the left upper and lower lobes likely reflecting an element of scarring. No pleural effusion or pneumothorax. Musculoskeletal: No acute osseous abnormality. CT ABDOMEN PELVIS FINDINGS Hepatobiliary: No focal liver abnormality is seen. No gallstones, gallbladder wall thickening, or biliary dilatation. Pancreas: Unremarkable. No pancreatic ductal dilatation or surrounding inflammatory changes. Spleen: Normal in size without focal abnormality. Adrenals/Urinary Tract: Normal adrenal glands. 18 mm hypodense, fluid attenuating right interpolar renal mass consistent with a cyst. No urolithiasis or obstructive uropathy. Normal bladder. Stomach/Bowel: Stomach is within normal limits. Appendix appears normal. No evidence of bowel wall thickening, distention, or inflammatory changes. Large amount of stool throughout the colon with rectal fecal impaction.  Vascular/Lymphatic: Normal caliber abdominal aorta with mild atherosclerosis. No lymphadenopathy. Reproductive: Bilateral adnexa are unremarkable. Small dystrophic calcifications in the posterior aspect of the uterus. Other: No abdominopelvic ascites.  Fat containing umbilical hernia. Musculoskeletal: No acute or significant osseous findings. Moderate osteoarthritis of bilateral SI joints. Expansion of the left obturator internus muscle which may reflect an intramuscular hematoma or infection. Mild expansion of the right operator internus muscle with a 17 mm low-attenuation focus within the muscle which may reflect a liquified intramuscular hematoma or abscess. Severe bilateral facet arthropathy at L3-4. IMPRESSION: 1. Patchy areas of peripheral airspace disease throughout the lungs bilaterally with mild diffuse ground-glass opacities throughout the lungs bilaterally. Differential considerations include persistent multilobar pneumonia including atypical viral pneumonia such as  COVID-19 versus alveolitis. Interstitial thickening along the periphery of bilateral lungs most severe in the left upper and lower lobes likely reflecting an element of scarring. 2. Expansion of the left obturator internus muscle which may reflect an intramuscular hematoma or infection. Mild expansion of the right operator internus muscle with a 17 mm low-attenuation focus within the muscle which may reflect a liquified intramuscular hematoma or abscess. Recommend further evaluation with an MRI of the pelvis without and with intravenous contrast. 3. Large amount of stool throughout the colon with rectal fecal impaction. 4. Aortic Atherosclerosis (ICD10-I70.0). Electronically Signed   By: Elige Ko   On: 03/14/2020 18:31   MR PELVIS W WO CONTRAST  Result Date: 03/15/2020 CLINICAL DATA:  60 year old female with expansion of operator musculatures seen on previous imaging. EXAM: MRI PELVIS WITHOUT AND WITH CONTRAST TECHNIQUE: Multiplanar  multisequence MR imaging of the pelvis was performed both before and after administration of intravenous contrast. CONTRAST:  8mL GADAVIST GADOBUTROL 1 MMOL/ML IV SOLN COMPARISON:  CT chest, abdomen and pelvis dated March 14, 2020 FINDINGS: Urinary Tract: No distal ureteral dilation. Urinary bladder with smooth contour. Bowel: Abundant stool in the rectum. Signs of rectal distension without perirectal stranding. Visualized bowel loops proximal to this without signs of dilation. Normal appendix in the RIGHT lower quadrant. Vascular/Lymphatic: Patent pelvic vasculature.  No adenopathy. Reproductive: Uterus with leiomyomas. Normal appearance of adnexal structures. Largest leiomyoma in the uterine fundus on the RIGHT measures 2 cm. Other: Edema in the musculature of the abductor compartment associated with hematomas in the bilateral obturator musculature, see below. Musculoskeletal: Moderately large hematomas in the bilateral obturator musculature, mixed signal on T2 and on T1. The LEFT side measuring 7.6 x 3.0 cm. The RIGHT-side measuring 6.2 x 3.0 cm. No sign of internal enhancement. Little or no peripheral enhancement and no signs of gas on prior CT. IMPRESSION: 1. Moderately large intramuscular hematomas in the bilateral obturator musculature with no sign of internal enhancement and only minimal peripheral enhancement at the interface with adjacent musculature. No specific findings to indicate infection. Correlate with any clinical signs of infection and attention on follow-up. 2. Bilateral abductor musculature with edema versus mild myositis. Favor edema given findings on current scan. 3. Abundant stool in the rectum. Due to the volume of stool fecal impaction is considered. These results will be called to the ordering clinician or representative by the Radiologist Assistant, and communication documented in the PACS or [employer]. Electronically Signed   By: Donzetta Kohut M.D.   On: 03/15/2020 14:50   DG  CHEST PORT 1 VIEW  Result Date: 03/13/2020 CLINICAL DATA:  COVID pneumonia, respiratory failure EXAM: PORTABLE CHEST 1 VIEW COMPARISON:  03/11/2020 FINDINGS: Lung volumes are small, but are symmetric and are stable since prior examination. Superimposed asymmetric pulmonary infiltrates largely diffusely throughout the left lung is again seen, likely infectious. No pneumothorax or pleural effusion. Cardiac size within normal limits. IMPRESSION: Stable examination. Pulmonary hypoinflation. Asymmetric diffuse pulmonary infiltrate predominantly within the left lung, likely infectious. Electronically Signed   By: Helyn Numbers MD   On: 03/13/2020 11:41   DG CHEST PORT 1 VIEW  Result Date: 03/11/2020 CLINICAL DATA:  Acute hypoxemic respiratory failure due to COVID 19. EXAM: PORTABLE CHEST 1 VIEW COMPARISON:  March 02, 2020. FINDINGS: The heart size and mediastinal contours are within normal limits. No pneumothorax or pleural effusion is noted. Patchy airspace opacities are noted throughout both lungs consistent with multifocal pneumonia. The visualized skeletal structures are unremarkable. IMPRESSION:  Patchy bilateral lung opacities are noted consistent with multifocal pneumonia. Followup radiographs are recommended until resolution. Electronically Signed   By: Lupita Raider M.D.   On: 03/11/2020 15:54   DG CHEST PORT 1 VIEW  Result Date: 03/02/2020 CLINICAL DATA:  60 year old female with tachypnea and acute respiratory failure. EXAM: PORTABLE CHEST 1 VIEW COMPARISON:  Chest radiograph dated 02/28/2020. FINDINGS: Interval worsening of bilateral pulmonary infiltrates compared to prior radiograph. No large pleural effusion no pneumothorax. Stable cardiac silhouette. No acute osseous pathology. IMPRESSION: Interval worsening of the bilateral pulmonary infiltrates compared to prior radiograph. Electronically Signed   By: Elgie Collard M.D.   On: 03/02/2020 17:03   DG CHEST PORT 1 VIEW  Result Date:  02/28/2020 CLINICAL DATA:  Hypoxia.  Respiratory failure.  COVID. EXAM: PORTABLE CHEST 1 VIEW COMPARISON:  02/26/2020. FINDINGS: Heart size stable. Diffuse severe bilateral pulmonary infiltrates are again noted. No significant interim change. No pleural effusion or pneumothorax. Degenerative changes scoliosis thoracic spine. IMPRESSION: Diffuse severe bilateral pulmonary infiltrates are again noted. No significant interim change. Electronically Signed   By: Maisie Fus  Register   On: 02/28/2020 12:50   DG CHEST PORT 1 VIEW  Result Date: 02/26/2020 CLINICAL DATA:  Dyspnea, COVID with increased shortness of breath EXAM: PORTABLE CHEST 1 VIEW COMPARISON:  February 21, 2020 FINDINGS: Lung volumes are diminished with buckling of the trachea as result. Image rotated to the RIGHT. Cardiomediastinal contours are stable. Interstitial and airspace disease with similar appearance in the RIGHT chest accounting for rotation compared to February 21, 2020. Potential slight improvement in the LEFT chest. On limited assessment no acute skeletal process. IMPRESSION: Diminished lung volumes compared to the prior study with potential slight interval improvement in the LEFT chest, otherwise similar appearance of diffuse interstitial and airspace disease in this patient with COVID-19 pneumonia. Electronically Signed   By: Donzetta Kohut M.D.   On: 02/26/2020 13:57   DG Abd Portable 1V  Result Date: 03/04/2020 CLINICAL DATA:  NG tube placement EXAM: PORTABLE ABDOMEN - 1 VIEW COMPARISON:  March 04, 2020 FINDINGS: The enteric tube projects over the gastric body. There is gaseous distention of loops of bowel throughout the abdomen, favored to represent mostly colon. There is no definite pneumatosis or free air involving the visualized portions of the abdomen. IMPRESSION: Enteric tube projects over the gastric body. Electronically Signed   By: Katherine Mantle M.D.   On: 03/04/2020 02:21   DG Abd Portable 1V  Result Date:  03/04/2020 CLINICAL DATA:  Abdominal distension and pain EXAM: PORTABLE ABDOMEN - 1 VIEW COMPARISON:  None. FINDINGS: Gas distended colon. Gas throughout the small bowel without visible dilatation. Bibasilar interstitial opacities. IMPRESSION: Gas distended colon and small bowel. No visible small bowel dilatation. Electronically Signed   By: Deatra Robinson M.D.   On: 03/04/2020 00:48   ECHOCARDIOGRAM COMPLETE  Result Date: 03/04/2020    ECHOCARDIOGRAM REPORT   Patient Name:   KRISTINIA LEAVY Date of Exam: 03/04/2020 Medical Rec #:  [id number]      Height:       68.0 in Accession #:    [id number]     Weight:       218.0 lb Date of Birth:  09/16/1960       BSA:          2.120 m Patient Age:    59 years       BP:           99/53 mmHg Patient Gender: F  HR:           122 bpm. Exam Location:  Inpatient Procedure: 2D Echo, Color Doppler, Cardiac Doppler and Intracardiac            Opacification Agent Indications:    Acutre respiratory distress R06.03  History:        Patient has no prior history of Echocardiogram examinations.                 Risk Factors:Dyslipidemia.  Sonographer:    Eulah Pont RDCS Referring Phys: [id number] MURALI RAMASWAMY IMPRESSIONS  1. Technically difficult study; definity used.  2. Left ventricular ejection fraction, by estimation, is 60 to 65%. The left ventricle has normal function. The left ventricle has no regional wall motion abnormalities. Left ventricular diastolic parameters are consistent with Grade I diastolic dysfunction (impaired relaxation).  3. Right ventricular systolic function is normal. The right ventricular size is normal. Tricuspid regurgitation signal is inadequate for assessing PA pressure.  4. The mitral valve is normal in structure. No evidence of mitral valve regurgitation. No evidence of mitral stenosis.  5. The aortic valve is normal in structure. Aortic valve regurgitation is not visualized. No aortic stenosis is present.  6. The inferior vena cava is normal  in size with greater than 50% respiratory variability, suggesting right atrial pressure of 3 mmHg. FINDINGS  Left Ventricle: Left ventricular ejection fraction, by estimation, is 60 to 65%. The left ventricle has normal function. The left ventricle has no regional wall motion abnormalities. Definity contrast agent was given IV to delineate the left ventricular  endocardial borders. The left ventricular internal cavity size was normal in size. There is no left ventricular hypertrophy. Left ventricular diastolic parameters are consistent with Grade I diastolic dysfunction (impaired relaxation). Right Ventricle: The right ventricular size is normal. Right ventricular systolic function is normal. Tricuspid regurgitation signal is inadequate for assessing PA pressure. The tricuspid regurgitant velocity is 1.40 m/s, and with an assumed right atrial  pressure of 3 mmHg, the estimated right ventricular systolic pressure is 10.8 mmHg. Left Atrium: Left atrial size was normal in size. Right Atrium: Right atrial size was normal in size. Pericardium: There is no evidence of pericardial effusion. Mitral Valve: The mitral valve is normal in structure. No evidence of mitral valve regurgitation. No evidence of mitral valve stenosis. Tricuspid Valve: The tricuspid valve is normal in structure. Tricuspid valve regurgitation is trivial. No evidence of tricuspid stenosis. Aortic Valve: The aortic valve is normal in structure. Aortic valve regurgitation is not visualized. No aortic stenosis is present. Pulmonic Valve: The pulmonic valve was not well visualized. Pulmonic valve regurgitation is not visualized. No evidence of pulmonic stenosis. Aorta: The aortic root is normal in size and structure. Venous: The inferior vena cava is normal in size with greater than 50% respiratory variability, suggesting right atrial pressure of 3 mmHg.  Additional Comments: Technically difficult study; definity used.  LEFT VENTRICLE PLAX 2D LVIDd:          3.10 cm  Diastology LVIDs:         2.20 cm  LV e' medial:    4.35 cm/s LV PW:         1.10 cm  LV E/e' medial:  12.4 LV IVS:        1.10 cm  LV e' lateral:   6.74 cm/s LVOT diam:     1.90 cm  LV E/e' lateral: 8.0 LV SV:         35 LV SV  Index:   16 LVOT Area:     2.84 cm  RIGHT VENTRICLE TAPSE (M-mode): 1.2 cm LEFT ATRIUM             Index       RIGHT ATRIUM           Index LA diam:        2.40 cm 1.13 cm/m  RA Area:     10.80 cm LA Vol (A2C):   22.3 ml 10.52 ml/m RA Volume:   20.70 ml  9.76 ml/m LA Vol (A4C):   22.5 ml 10.61 ml/m LA Biplane Vol: 23.9 ml 11.27 ml/m  AORTIC VALVE LVOT Vmax:   90.00 cm/s LVOT Vmean:  63.600 cm/s LVOT VTI:    0.122 m  AORTA Ao Root diam: 3.50 cm Ao Asc diam:  3.10 cm MITRAL VALVE               TRICUSPID VALVE MV Area (PHT): 2.43 cm    TR Peak grad:   7.8 mmHg MV Decel Time: 312 msec    TR Vmax:        140.00 cm/s MV E velocity: 53.80 cm/s MV A velocity: 83.40 cm/s  SHUNTS MV E/A ratio:  0.65        Systemic VTI:  0.12 m                            Systemic Diam: 1.90 cm Olga Millers MD Electronically signed by Olga Millers MD Signature Date/Time: 03/04/2020/4:38:19 PM    Final       No flowsheet data found.  No results found for: NITRICOXIDE      Assessment & Plan:   Pneumonia due to COVID-19 virus Prolonged hospitalization from COVID-19 with multiple complications including Covid pneumonia.  Patient has completed antibiotics and steroids.  She is continue on her pulmonary hygiene regimen.   We will check chest x-ray in 2 weeks on return  Plan  Patient Instructions  Continue on Pulmicort neb Twice daily   Albuterol neb As needed   Continue on BIPAP At bedtime with Oxygen 3l/m  And with naps.  Mucinex DM Twice daily  Liquid As needed  Cough  Tessalon Three times a day  As needed  Cough . Continue on Oxygen 3l/m rest and 4l/m activity  PT as planned.  Refer to Hematology .  Labs today  Follow up with Dr. Melida Quitter or Shamiya Demeritt NP in 2 weeks with chest  xray and As needed   Please contact office for sooner follow up if symptoms do not improve or worsen or seek emergency care           Chronic respiratory failure with hypoxia and hypercapnia (HCC) Newly developed respiratory failure with hypercarbia and hypoxemia after prolonged hospitalization with COVID-19.  Patient is oxygen demands are decreasing.  She is now on oxygen 3 L at rest and 4 L with activity.  Also despite have suspected underlying undiagnosed sleep apnea/OHS.  Patient is continue on oxygen and BiPAP.  On return visit we will discuss setting up for a in lab sleep study.   Physical deconditioning Patient had a prolonged hospitalization over the last 2 months.  She has significant physical deconditioning and activity intolerance.  She is to begin physical therapy at home this week.  Anemia Anemia with critical illness and secondary to acute blood loss from hematoma on anticoagulation.  Patient did receive a transfusion during hospitalization.  She  is now off of anticoagulation. We will check CBC today.  She is to follow-up with primary care provider going forward.  Pulmonary emboli [facility]) Provoked PE diagnosed February 12, 2020.  This was in the setting of acute illness with COVID-19 infection and acute respiratory failure and pneumonia.  Patient was started on anticoagulation therapy.  However this was recently stopped to severe anemia from hematoma. Long discussion with patient and husband regarding benefit and risk.  Will refer to hematology for evaluation.Rubye Oaks, NP 03/24/2020

## 2020-03-24 NOTE — Assessment & Plan Note (Signed)
Anemia with critical illness and secondary to acute blood loss from hematoma on anticoagulation.  Patient did receive a transfusion during hospitalization.  She is now off of anticoagulation. We will check CBC today.  She is to follow-up with primary care provider going forward.

## 2020-03-24 NOTE — Assessment & Plan Note (Signed)
Newly developed respiratory failure with hypercarbia and hypoxemia after prolonged hospitalization with COVID-19.  Patient is oxygen demands are decreasing.  She is now on oxygen 3 L at rest and 4 L with activity.  Also despite have suspected underlying undiagnosed sleep apnea/OHS.  Patient is continue on oxygen and BiPAP.  On return visit we will discuss setting up for a in lab sleep study.

## 2020-03-24 NOTE — Patient Instructions (Addendum)
Continue on Pulmicort neb Twice daily   Albuterol neb As needed   Continue on BIPAP At bedtime with Oxygen 3l/m  And with naps.  Mucinex DM Twice daily  Liquid As needed  Cough  Tessalon Three times a day  As needed  Cough . Continue on Oxygen 3l/m rest and 4l/m activity  PT as planned.  Refer to Hematology .  Labs today  Follow up with Dr. Melida Quitter or [place] NP in 2 weeks with chest xray and As needed   Please contact office for sooner follow up if symptoms do not improve or worsen or seek emergency care

## 2020-03-24 NOTE — Assessment & Plan Note (Signed)
Provoked PE diagnosed February 12, 2020.  This was in the setting of acute illness with COVID-19 infection and acute respiratory failure and pneumonia.  Patient was started on anticoagulation therapy.  However this was recently stopped to severe anemia from hematoma. Long discussion with patient and husband regarding benefit and risk.  Will refer to hematology for evaluation.Marland Kitchen

## 2020-03-25 LAB — BASIC METABOLIC PANEL
BUN: 4 mg/dL — ABNORMAL LOW (ref 6–23)
CO2: 35 mEq/L — ABNORMAL HIGH (ref 19–32)
Calcium: 9.2 mg/dL (ref 8.4–10.5)
Chloride: 98 mEq/L (ref 96–112)
Creatinine, Ser: 0.38 mg/dL — ABNORMAL LOW (ref 0.40–1.20)
GFR: 111.14 mL/min (ref >60.00)
Glucose, Bld: 111 mg/dL — ABNORMAL HIGH (ref 70–99)
Potassium: 3.3 mEq/L — ABNORMAL LOW (ref 3.5–5.1)
Sodium: 139 mEq/L (ref 135–145)

## 2020-03-25 LAB — CBC WITH DIFFERENTIAL/PLATELET
Basophils Absolute: 0.1 10*3/uL (ref 0.0–0.1)
Basophils Relative: 0.6 % (ref 0.0–3.0)
Eosinophils Absolute: 0.2 10*3/uL (ref 0.0–0.7)
Eosinophils Relative: 1.9 % (ref 0.0–5.0)
HCT: 34.2 % — ABNORMAL LOW (ref 36.0–46.0)
Hemoglobin: 10.7 g/dL — ABNORMAL LOW (ref 12.0–15.0)
Lymphocytes Relative: 5.7 % — ABNORMAL LOW (ref 12.0–46.0)
Lymphs Abs: 0.7 10*3/uL (ref 0.7–4.0)
MCHC: 31.4 g/dL (ref 30.0–36.0)
MCV: 94.9 fl (ref 78.0–100.0)
Monocytes Absolute: 0.7 10*3/uL (ref 0.1–1.0)
Monocytes Relative: 5.3 % (ref 3.0–12.0)
Neutro Abs: 11.1 10*3/uL — ABNORMAL HIGH (ref 1.4–7.7)
Neutrophils Relative %: 86.5 % — ABNORMAL HIGH (ref 43.0–77.0)
Platelets: 401 10*3/uL — ABNORMAL HIGH (ref 150.0–400.0)
RBC: 3.6 Mil/uL — ABNORMAL LOW (ref 3.87–5.11)
RDW: 19.3 % — ABNORMAL HIGH (ref 11.5–15.5)
WBC: 12.8 10*3/uL — ABNORMAL HIGH (ref 4.0–10.5)

## 2020-03-25 NOTE — TOC Progression Note (Signed)
Transition of Care [facility]) - Progression Note    Patient Details  Name: Christina Vincent MRN: [id number] Date of Birth: [birth year]  [facility] CM/SW Contact  Geni Bers, RN Phone Number: 03/25/2020, 1:50 PM  Clinical Narrative:    In pt's chart related to daughter Destiny call concerning Home Health. On the day of discharge pt qualified for insurance. During research [facility] could not take pt related to her new insurance. [facility] will follow pt, daughter was made aware.    Expected Discharge Plan: Home/Self Care    Expected Discharge Plan and Services Expected Discharge Plan: Home/Self Care   Discharge Planning Services: CM Consult   Living arrangements for the past 2 months: Single Family Home Expected Discharge Date: 03/21/20                                     Social Determinants of Health (SDOH) Interventions    Readmission Risk Interventions No flowsheet data found.

## 2020-03-26 ENCOUNTER — Telehealth: Payer: Self-pay | Admitting: *Deleted

## 2020-03-26 ENCOUNTER — Other Ambulatory Visit: Payer: Self-pay | Admitting: *Deleted

## 2020-03-26 MED ORDER — POTASSIUM CHLORIDE ER 10 MEQ PO TBCR: 10.0000 meq | EXTENDED_RELEASE_TABLET | Freq: Every day | ORAL | 0 refills | Status: AC

## 2020-03-26 NOTE — Progress Notes (Signed)
Called and spoke with patient, verified dob and patient gave permission to speak with her daughter, Christina Vincent who is also her POA.  Provided results per recommendations per Rubye Oaks NP.  Advised daughter that I would route the results to her pcp, Dr. Avon Gully.  She verbalized understanding.  Nothing further needed.

## 2020-03-26 NOTE — Telephone Encounter (Signed)
Called Dr. Letitia Neri office at [phone] to get fax #, fax # verified as [phone].  Labs from 03/24/2020 faxed to Dr. Letitia Neri office.  Patient has scheduled appointment in place.  Nothing further needed.

## 2020-03-27 ENCOUNTER — Telehealth: Payer: Self-pay | Admitting: Adult Health

## 2020-03-27 NOTE — Telephone Encounter (Signed)
Called and spoke with Christina Vincent and she stated that her mother had a low grade temp this morning of 99.8 and her labs showed that her WBC count was elevated and the daughter stated that given her mothers condition, she would like to see if TP would send in an abx for her.  She does not see her PCP until Tuesday and they did not want to wait that long to get her on abx.  TP please advise. Thanks   No Known Allergies

## 2020-03-27 NOTE — Telephone Encounter (Signed)
Patient with no acute symptoms of infection.  Did have a low-grade temperature today but no productive cough with thick green mucus.  Just finished antibiotics.  Also recently had a blood transfusion.  We will hold on antibiotics this time however explained to patient's family that if patient has worsening fevers or persistent fevers and productive cough with discolored mucus we can definitely consider adding another antibiotic and.  Patient has a close follow-up in 2 weeks with chest x-ray  Please contact office for sooner follow up if symptoms do not improve or worsen or seek emergency care

## 2020-03-28 ENCOUNTER — Ambulatory Visit: Payer: Self-pay | Admitting: Gastroenterology

## 2020-04-02 ENCOUNTER — Telehealth: Payer: Self-pay | Admitting: Adult Health

## 2020-04-02 NOTE — Telephone Encounter (Signed)
Pts daughter is calling in regards to her speech being slurred and her BP has been low, supposed to be getting blood work done on the 28th and wanted to make sure that everything is working properly for her oxygen, also wanted advice in regards to her mothers sleeping medicine bc she has not been sleeping at night.  Pls regard [phone]

## 2020-04-02 NOTE — Telephone Encounter (Signed)
Spoke with patient's daughter Babette Relic. She stated the patient has had new onset slurred speech that started over the weekend at night. They have noticed this during the day as well. She also noticed some stiffness in her legs to the point she has had to help her to move around the house. I advised her that due to the slurred speech and stiffness, she needs to be evaluated ASAP in the ED. She verbalized understanding.

## 2020-04-04 ENCOUNTER — Telehealth: Payer: Self-pay | Admitting: Adult Health

## 2020-04-04 NOTE — Telephone Encounter (Signed)
I have called the pts daughter and she is aware of TP recs.  She stated that she spoke with the PCP office earlier that they told her that it would have to come from our office.  I advised the daughter again that TP will not write for any anxiety meds and that this would have to come from her PCP.  Daughter requested that the last OV note with TP be faxed to her PCP   This has been done.

## 2020-04-04 NOTE — Telephone Encounter (Signed)
I called and spoke with Christina Vincent, the pts daughter and she stated that Theodoro Grist came out today to place the pt on her BIPAP.  The daughter stated that the machine is already pre-set and she stated that Theodoro Grist was going to send a message over to TP to see if we can prescribe something for the pt to help with her anxiety while she is on the machine.  No message received from [place] yet.  Will forward to TP to make her aware.

## 2020-04-04 NOTE — Telephone Encounter (Signed)
So sorry to hear this , but anxiety meds have to be addressed by PCP .  Can refer to counseling or Psych if needed

## 2020-04-07 ENCOUNTER — Other Ambulatory Visit: Payer: Self-pay

## 2020-04-07 ENCOUNTER — Ambulatory Visit: Payer: 59 | Admitting: Adult Health

## 2020-04-07 ENCOUNTER — Encounter: Payer: Self-pay | Admitting: Adult Health

## 2020-04-07 ENCOUNTER — Ambulatory Visit (INDEPENDENT_AMBULATORY_CARE_PROVIDER_SITE_OTHER): Payer: 59 | Admitting: Adult Health

## 2020-04-07 ENCOUNTER — Ambulatory Visit (INDEPENDENT_AMBULATORY_CARE_PROVIDER_SITE_OTHER): Payer: 59

## 2020-04-07 VITALS — BP 100/70 | HR 114 | Temp 97.8°F | Ht 68.0 in | Wt 170.0 lb

## 2020-04-07 DIAGNOSIS — J849 Interstitial pulmonary disease, unspecified: Secondary | ICD-10-CM

## 2020-04-07 DIAGNOSIS — J1282 Pneumonia due to coronavirus disease 2019: Secondary | ICD-10-CM | POA: Diagnosis not present

## 2020-04-07 DIAGNOSIS — R5381 Other malaise: Secondary | ICD-10-CM

## 2020-04-07 DIAGNOSIS — U071 COVID-19: Secondary | ICD-10-CM

## 2020-04-07 DIAGNOSIS — J9612 Chronic respiratory failure with hypercapnia: Secondary | ICD-10-CM

## 2020-04-07 DIAGNOSIS — J9611 Chronic respiratory failure with hypoxia: Secondary | ICD-10-CM

## 2020-04-07 DIAGNOSIS — I2699 Other pulmonary embolism without acute cor pulmonale: Secondary | ICD-10-CM

## 2020-04-07 NOTE — Addendum Note (Signed)
Addended by: Dulcy Fanny on: 04/07/2020 11:01 AM   Modules accepted: Orders

## 2020-04-07 NOTE — Progress Notes (Signed)
@Patient  ID: , female    DOB: 1961-01-19, 60 y.o.   MRN: [id number]  Chief Complaint  Patient presents with  . Follow-up    Referring provider: [id number], MD  HPI: 60 year old female former smoker with multiple medical problems including diabetes, obesity, hypertension, hyperlipidemia was seen during hospitalization for critical illness with COVID-19 in December 2021 patient had a prolonged hospitalization from December 27 through March 21, 2020 for acute respiratory failure secondary to COVID-19, with a very complicated hospital stay with bilateral PEs, severe anemia, MRSA pneumonia, large bilateral obturator hematoma, severe anxiety, undiagnosed OSA OHS  TEST/EVENTS :  2D echo March 04, 2020 EF 60 to 65%, grade 1 diastolic dysfunction normal RV size and systolic function.  04/07/2020 Follow up : COVID-19 Covid pneumonia and chronic respiratory failure Patient returns for a 2-week follow-up.  Patient was seen last visit after a prolonged hospital stay for critical illness with COVID-19 complicated by Covid pneumonia, acute respiratory failure and bilateral PEs.  She also had MRSA pneumonia and severe anemia and a large hematoma.  She was felt to have undiagnosed OSA and OHS.  She was treated with IV steroids, remdesivir and 14 days ofBaricitinib .  She was treated with oxygen.  Found to have bilateral PEs.  She was started on anticoagulation therapy however due to severe anemia and a large obturator muscle hematoma anticoagulation was stopped.  She had previously been on full dose Lovenox.  She also had a multifocal pneumonia.  Sputum culture was positive for MRSA.  She was treated with aggressive IV antibiotics.  At discharge she was discharged on 3 L of oxygen at rest and 4 L with activity.  CT chest showed patchy areas of airspace disease throughout the lungs.  Hospital stay was complicated by severe anxiety.  She has been weaned off of prednisone.  She was treated with  Pulmicort nebulizers twice daily.  Last visit patient was getting ready to start physical therapy.  She had severe deconditioning and low activity tolerance.  Last visit she was recommended continue on BiPAP at bedtime.  Continue on her oxygen therapy to maintain O2 saturations greater than 88 to 90%.  She was also referred to hematology due to her diagnosis of PE .  Hematology appointment is coming up in a few weeks.  O2 saturations today in the office were able to maintain above 90% on 2 L of oxygen at rest.  She is using 4 L of oxygen with physical therapy  Last visit patient's potassium was slightly low.  She was started on K-Dur 10 milliequivalents daily.  Hemoglobin had improved since discharge.  And her white count was slightly elevated at 12.8K.  Since last visit patient remains very fatigued low energy gets short of breath with minimal activity.  She continues to have severe anxiety.  She has been seen by her primary care provider and started on several anxiety medications including Remeron, Seroquel and trazodone.  She has not seen a whole lot of improvement yet.  Download shows that patient is on a trilogy device.  She had about 50% compliance over the last 2 weeks with daily average usage around 3 hours.  We discussed the importance of compliance.  Patient education on compliance.  Patient is undergoing physical therapy at home.  Her daughter helps with a lot of her care.   No Known Allergies   There is no immunization history on file for this patient.  Past Medical History:  Diagnosis Date  .  Depression   . Dyslipidemia     Tobacco History: Social History   Tobacco Use  Smoking Status Former Smoker  . Packs/day: 1.00  . Years: 8.00  . Pack years: 8.00  . Quit date: 03/24/1986  . Years since quitting: 34.0  Smokeless Tobacco Never Used  Tobacco Comment   quit 1988   Counseling given: Not Answered Comment: quit 1988   Outpatient Medications Prior to Visit   Medication Sig Dispense Refill  . albuterol (PROVENTIL) (2.5 MG/3ML) 0.083% nebulizer solution Take 3 mLs (2.5 mg total) by nebulization every 4 (four) hours as needed for wheezing or shortness of breath. 75 mL 12  . atorvastatin (LIPITOR) 10 MG tablet Take 10 mg by mouth daily.    . benzonatate (TESSALON) 200 MG capsule Take 1 capsule (200 mg total) by mouth 3 (three) times daily as needed for cough. 30 capsule 1  . budesonide (PULMICORT) 0.5 MG/2ML nebulizer solution Take 2 mLs (0.5 mg total) by nebulization 2 (two) times daily. 2 mL 12  . chlorhexidine (PERIDEX) 0.12 % solution 15 mLs by Mouth Rinse route 2 (two) times daily. 120 mL 0  . D3-50 1.25 MG (50000 UT) capsule Take 50,000 Units by mouth every 30 (thirty) days.    Marland Kitchen gabapentin (NEURONTIN) 100 MG capsule Take 1 capsule (100 mg total) by mouth 2 (two) times daily. 60 capsule 1  . guaiFENesin-codeine 100-10 MG/5ML syrup Take 10 mLs by mouth every 4 (four) hours as needed for cough. 120 mL 0  . hydrocortisone (ANUSOL-HC) 2.5 % rectal cream Place rectally 2 (two) times daily as needed for hemorrhoids or anal itching. 30 g 0  . lip balm (CARMEX) ointment Apply topically as needed for lip care. 7 g 0  . magic mouthwash SOLN Take 15 mLs by mouth 3 (three) times daily as needed for mouth pain. 15 mL 0  . metFORMIN (GLUCOPHAGE) 850 MG tablet Take 850 mg by mouth 2 (two) times daily.    . mirtazapine (REMERON) 15 MG tablet Take 1 tablet (15 mg total) by mouth at bedtime. 30 tablet 2  . polyethylene glycol (MIRALAX / GLYCOLAX) 17 g packet Take 17 g by mouth daily as needed for moderate constipation. 14 each 0  . potassium chloride (KLOR-CON) 10 MEQ tablet Take 1 tablet (10 mEq total) by mouth daily. 30 tablet 0  . predniSONE (DELTASONE) 5 MG tablet Take 1 tablet (5 mg total) by mouth daily with breakfast. 2 tablet 0  . QUEtiapine (SEROQUEL) 25 MG tablet Take 1 tablet (25 mg total) by mouth 2 (two) times daily. 60 tablet 2  . senna-docusate  (SENOKOT-S) 8.6-50 MG tablet Take 2 tablets by mouth 2 (two) times daily. 30 tablet 0  . traZODone (DESYREL) 50 MG tablet Take 1 tablet (50 mg total) by mouth at bedtime as needed for sleep. 30 tablet 2   No facility-administered medications prior to visit.     Review of Systems:   Constitutional:   No  weight loss, night sweats,  Fevers, chills, + fatigue, or  lassitude.  HEENT:   No headaches,  Difficulty swallowing,  Tooth/dental problems, or  Sore throat,                No sneezing, itching, ear ache, nasal congestion, post nasal drip,   CV:  No chest pain,  Orthopnea, PND, swelling in lower extremities, anasarca, dizziness, palpitations, syncope.   GI  No heartburn, indigestion, abdominal pain, nausea, vomiting, diarrhea, change in bowel habits, loss  of appetite, bloody stools.   Resp: No shortness of breath with exertion or at rest.  No excess mucus, no productive cough,  No non-productive cough,  No coughing up of blood.  No change in color of mucus.  No wheezing.  No chest wall deformity  Skin: no rash or lesions.  GU: no dysuria, change in color of urine, no urgency or frequency.  No flank pain, no hematuria   MS:  No joint pain or swelling.  No decreased range of motion.  No back pain.    Physical Exam  BP 100/70 (BP Location: Left Arm, Cuff Size: Normal)   Pulse (!) 114   SpO2 99%   GEN: A/Ox3; pleasant , NAD, elderly , on O2 in wc    HEENT:  [employer]/AT,    NOSE-clear, THROAT-clear, no lesions, no postnasal drip or exudate noted.   NECK:  Supple w/ fair ROM; no JVD; normal carotid impulses w/o bruits; no thyromegaly or nodules palpated; no lymphadenopathy.    RESP  Clear  P & A; w/o, wheezes/ rales/ or rhonchi. no accessory muscle use, no dullness to percussion  CARD:  RRR, no m/r/g, no peripheral edema, pulses intact, no cyanosis or clubbing.  GI:   Soft & nt; nml bowel sounds; no organomegaly or masses detected.   Musco: Warm bil, no deformities or joint  swelling noted.   Neuro: alert, no focal deficits noted.    Skin: Warm, no lesions or rashes    Lab Results:  CBC    Component Value Date/Time   WBC 12.8 (H) 03/24/2020 1522   RBC 3.60 (L) 03/24/2020 1522   HGB 10.7 (L) 03/24/2020 1522   HCT 34.2 (L) 03/24/2020 1522   PLT 401.0 (H) 03/24/2020 1522   MCV 94.9 03/24/2020 1522   MCH 30.0 03/20/2020 0518   MCHC 31.4 03/24/2020 1522   RDW 19.3 (H) 03/24/2020 1522   LYMPHSABS 0.7 03/24/2020 1522   MONOABS 0.7 03/24/2020 1522   EOSABS 0.2 03/24/2020 1522   BASOSABS 0.1 03/24/2020 1522    BMET    Component Value Date/Time   NA 139 03/24/2020 1522   K 3.3 (L) 03/24/2020 1522   CL 98 03/24/2020 1522   CO2 35 (H) 03/24/2020 1522   GLUCOSE 111 (H) 03/24/2020 1522   BUN 4 (L) 03/24/2020 1522   CREATININE 0.38 (L) 03/24/2020 1522   CALCIUM 9.2 03/24/2020 1522   GFRNONAA >60 03/20/2020 0518    BNP    Component Value Date/Time   BNP 50.5 03/13/2020 0434    ProBNP No results found for: PROBNP  Imaging: CT ABDOMEN PELVIS WO CONTRAST  Result Date: 03/14/2020 CLINICAL DATA:  Respiratory failure. EXAM: CT CHEST, ABDOMEN AND PELVIS WITHOUT CONTRAST TECHNIQUE: Multidetector CT imaging of the chest, abdomen and pelvis was performed following the standard protocol without IV contrast. COMPARISON:  02/12/2020 FINDINGS: CT CHEST FINDINGS Cardiovascular: No significant vascular findings. Mildly enlarged heart size. No pericardial effusion. Mediastinum/Nodes: No enlarged mediastinal, hilar, or axillary lymph nodes. Thyroid gland, trachea, and esophagus demonstrate no significant findings. Lungs/Pleura: Patchy areas of peripheral airspace disease throughout the lungs. Mild diffuse ground-glass opacities throughout the lungs bilaterally. Interstitial thickening along the periphery of bilateral lungs most severe in the left upper and lower lobes likely reflecting an element of scarring. No pleural effusion or pneumothorax. Musculoskeletal: No  acute osseous abnormality. CT ABDOMEN PELVIS FINDINGS Hepatobiliary: No focal liver abnormality is seen. No gallstones, gallbladder wall thickening, or biliary dilatation. Pancreas: Unremarkable. No pancreatic ductal dilatation  or surrounding inflammatory changes. Spleen: Normal in size without focal abnormality. Adrenals/Urinary Tract: Normal adrenal glands. 18 mm hypodense, fluid attenuating right interpolar renal mass consistent with a cyst. No urolithiasis or obstructive uropathy. Normal bladder. Stomach/Bowel: Stomach is within normal limits. Appendix appears normal. No evidence of bowel wall thickening, distention, or inflammatory changes. Large amount of stool throughout the colon with rectal fecal impaction. Vascular/Lymphatic: Normal caliber abdominal aorta with mild atherosclerosis. No lymphadenopathy. Reproductive: Bilateral adnexa are unremarkable. Small dystrophic calcifications in the posterior aspect of the uterus. Other: No abdominopelvic ascites.  Fat containing umbilical hernia. Musculoskeletal: No acute or significant osseous findings. Moderate osteoarthritis of bilateral SI joints. Expansion of the left obturator internus muscle which may reflect an intramuscular hematoma or infection. Mild expansion of the right operator internus muscle with a 17 mm low-attenuation focus within the muscle which may reflect a liquified intramuscular hematoma or abscess. Severe bilateral facet arthropathy at L3-4. IMPRESSION: 1. Patchy areas of peripheral airspace disease throughout the lungs bilaterally with mild diffuse ground-glass opacities throughout the lungs bilaterally. Differential considerations include persistent multilobar pneumonia including atypical viral pneumonia such as COVID-19 versus alveolitis. Interstitial thickening along the periphery of bilateral lungs most severe in the left upper and lower lobes likely reflecting an element of scarring. 2. Expansion of the left obturator internus muscle  which may reflect an intramuscular hematoma or infection. Mild expansion of the right operator internus muscle with a 17 mm low-attenuation focus within the muscle which may reflect a liquified intramuscular hematoma or abscess. Recommend further evaluation with an MRI of the pelvis without and with intravenous contrast. 3. Large amount of stool throughout the colon with rectal fecal impaction. 4. Aortic Atherosclerosis (ICD10-I70.0). Electronically Signed   By: Elige Ko   On: 03/14/2020 18:31   CT CHEST WO CONTRAST  Result Date: 03/14/2020 CLINICAL DATA:  Respiratory failure. EXAM: CT CHEST, ABDOMEN AND PELVIS WITHOUT CONTRAST TECHNIQUE: Multidetector CT imaging of the chest, abdomen and pelvis was performed following the standard protocol without IV contrast. COMPARISON:  02/12/2020 FINDINGS: CT CHEST FINDINGS Cardiovascular: No significant vascular findings. Mildly enlarged heart size. No pericardial effusion. Mediastinum/Nodes: No enlarged mediastinal, hilar, or axillary lymph nodes. Thyroid gland, trachea, and esophagus demonstrate no significant findings. Lungs/Pleura: Patchy areas of peripheral airspace disease throughout the lungs. Mild diffuse ground-glass opacities throughout the lungs bilaterally. Interstitial thickening along the periphery of bilateral lungs most severe in the left upper and lower lobes likely reflecting an element of scarring. No pleural effusion or pneumothorax. Musculoskeletal: No acute osseous abnormality. CT ABDOMEN PELVIS FINDINGS Hepatobiliary: No focal liver abnormality is seen. No gallstones, gallbladder wall thickening, or biliary dilatation. Pancreas: Unremarkable. No pancreatic ductal dilatation or surrounding inflammatory changes. Spleen: Normal in size without focal abnormality. Adrenals/Urinary Tract: Normal adrenal glands. 18 mm hypodense, fluid attenuating right interpolar renal mass consistent with a cyst. No urolithiasis or obstructive uropathy. Normal bladder.  Stomach/Bowel: Stomach is within normal limits. Appendix appears normal. No evidence of bowel wall thickening, distention, or inflammatory changes. Large amount of stool throughout the colon with rectal fecal impaction. Vascular/Lymphatic: Normal caliber abdominal aorta with mild atherosclerosis. No lymphadenopathy. Reproductive: Bilateral adnexa are unremarkable. Small dystrophic calcifications in the posterior aspect of the uterus. Other: No abdominopelvic ascites.  Fat containing umbilical hernia. Musculoskeletal: No acute or significant osseous findings. Moderate osteoarthritis of bilateral SI joints. Expansion of the left obturator internus muscle which may reflect an intramuscular hematoma or infection. Mild expansion of the right operator internus muscle with a 17  mm low-attenuation focus within the muscle which may reflect a liquified intramuscular hematoma or abscess. Severe bilateral facet arthropathy at L3-4. IMPRESSION: 1. Patchy areas of peripheral airspace disease throughout the lungs bilaterally with mild diffuse ground-glass opacities throughout the lungs bilaterally. Differential considerations include persistent multilobar pneumonia including atypical viral pneumonia such as COVID-19 versus alveolitis. Interstitial thickening along the periphery of bilateral lungs most severe in the left upper and lower lobes likely reflecting an element of scarring. 2. Expansion of the left obturator internus muscle which may reflect an intramuscular hematoma or infection. Mild expansion of the right operator internus muscle with a 17 mm low-attenuation focus within the muscle which may reflect a liquified intramuscular hematoma or abscess. Recommend further evaluation with an MRI of the pelvis without and with intravenous contrast. 3. Large amount of stool throughout the colon with rectal fecal impaction. 4. Aortic Atherosclerosis (ICD10-I70.0). Electronically Signed   By: Elige Ko   On: 03/14/2020 18:31    MR PELVIS W WO CONTRAST  Result Date: 03/15/2020 CLINICAL DATA:  60 year old female with expansion of operator musculatures seen on previous imaging. EXAM: MRI PELVIS WITHOUT AND WITH CONTRAST TECHNIQUE: Multiplanar multisequence MR imaging of the pelvis was performed both before and after administration of intravenous contrast. CONTRAST:  69mL GADAVIST GADOBUTROL 1 MMOL/ML IV SOLN COMPARISON:  CT chest, abdomen and pelvis dated March 14, 2020 FINDINGS: Urinary Tract: No distal ureteral dilation. Urinary bladder with smooth contour. Bowel: Abundant stool in the rectum. Signs of rectal distension without perirectal stranding. Visualized bowel loops proximal to this without signs of dilation. Normal appendix in the RIGHT lower quadrant. Vascular/Lymphatic: Patent pelvic vasculature.  No adenopathy. Reproductive: Uterus with leiomyomas. Normal appearance of adnexal structures. Largest leiomyoma in the uterine fundus on the RIGHT measures 2 cm. Other: Edema in the musculature of the abductor compartment associated with hematomas in the bilateral obturator musculature, see below. Musculoskeletal: Moderately large hematomas in the bilateral obturator musculature, mixed signal on T2 and on T1. The LEFT side measuring 7.6 x 3.0 cm. The RIGHT-side measuring 6.2 x 3.0 cm. No sign of internal enhancement. Little or no peripheral enhancement and no signs of gas on prior CT. IMPRESSION: 1. Moderately large intramuscular hematomas in the bilateral obturator musculature with no sign of internal enhancement and only minimal peripheral enhancement at the interface with adjacent musculature. No specific findings to indicate infection. Correlate with any clinical signs of infection and attention on follow-up. 2. Bilateral abductor musculature with edema versus mild myositis. Favor edema given findings on current scan. 3. Abundant stool in the rectum. Due to the volume of stool fecal impaction is considered. These results will be  called to the ordering clinician or representative by the Radiologist Assistant, and communication documented in the PACS or [employer]. Electronically Signed   By: Donzetta Kohut M.D.   On: 03/15/2020 14:50   DG CHEST PORT 1 VIEW  Result Date: 03/13/2020 CLINICAL DATA:  COVID pneumonia, respiratory failure EXAM: PORTABLE CHEST 1 VIEW COMPARISON:  03/11/2020 FINDINGS: Lung volumes are small, but are symmetric and are stable since prior examination. Superimposed asymmetric pulmonary infiltrates largely diffusely throughout the left lung is again seen, likely infectious. No pneumothorax or pleural effusion. Cardiac size within normal limits. IMPRESSION: Stable examination. Pulmonary hypoinflation. Asymmetric diffuse pulmonary infiltrate predominantly within the left lung, likely infectious. Electronically Signed   By: Helyn Numbers MD   On: 03/13/2020 11:41   DG CHEST PORT 1 VIEW  Result Date: 03/11/2020 CLINICAL DATA:  Acute  hypoxemic respiratory failure due to COVID 19. EXAM: PORTABLE CHEST 1 VIEW COMPARISON:  March 02, 2020. FINDINGS: The heart size and mediastinal contours are within normal limits. No pneumothorax or pleural effusion is noted. Patchy airspace opacities are noted throughout both lungs consistent with multifocal pneumonia. The visualized skeletal structures are unremarkable. IMPRESSION: Patchy bilateral lung opacities are noted consistent with multifocal pneumonia. Followup radiographs are recommended until resolution. Electronically Signed   By: Lupita RaiderJames  Green Jr M.D.   On: 03/11/2020 15:54      No flowsheet data found.  No results found for: NITRICOXIDE      Assessment & Plan:   No problem-specific Assessment & Plan notes found for this encounter.     Rubye Oaksammy Shevon Sian, NP 04/07/2020

## 2020-04-07 NOTE — Patient Instructions (Addendum)
Continue on Pulmicort neb Twice daily   Albuterol Neb Twice daily   And As needed   Continue on TRILOGY  At bedtime with Oxygen 3l/m  And with naps.  Mucinex DM Twice daily  Liquid As needed  Cough  Tessalon Three times a day  As needed  Cough . Decrease Oxygen 2l/m rest and 4l/m activity  PT as planned.  Follow up Hematology as planned  Follow up with Dr. Melida Quitter or Jasenia Weilbacher NP in 4-6 weeks and As needed   Please contact office for sooner follow up if symptoms do not improve or worsen or seek emergency care

## 2020-04-08 LAB — BASIC METABOLIC PANEL
BUN: 3 mg/dL — ABNORMAL LOW (ref 6–23)
CO2: 36 mEq/L — ABNORMAL HIGH (ref 19–32)
Calcium: 8.7 mg/dL (ref 8.4–10.5)
Chloride: 98 mEq/L (ref 96–112)
Creatinine, Ser: 0.33 mg/dL — ABNORMAL LOW (ref 0.40–1.20)
GFR: 114.95 mL/min (ref >60.00)
Glucose, Bld: 123 mg/dL — ABNORMAL HIGH (ref 70–99)
Potassium: 3.5 mEq/L (ref 3.5–5.1)
Sodium: 141 mEq/L (ref 135–145)

## 2020-04-08 NOTE — Assessment & Plan Note (Signed)
Severe physical deconditioning.  Continue with physical therapy at home.

## 2020-04-08 NOTE — Assessment & Plan Note (Addendum)
Chronic hypercarbic and hypoxic respiratory failure.  O2 demand today have decreased slightly since last visit.  She is to decrease oxygen to 2 L at rest and 4 L with activity. Continue on trilogy device at bedtime-she is encouraged on compliance.  Hopefully anxiety will improve so that this will not be a barrier going forward  Plan  Patient Instructions  Continue on Pulmicort neb Twice daily   Albuterol Neb Twice daily   And As needed   Continue on TRILOGY  At bedtime with Oxygen 3l/m  And with naps.  Mucinex DM Twice daily  Liquid As needed  Cough  Tessalon Three times a day  As needed  Cough . Decrease Oxygen 2l/m rest and 4l/m activity  PT as planned.  Follow up Hematology as planned  Follow up with Dr. Melida Quitter or [place] NP in 4-6 weeks and As needed   Please contact office for sooner follow up if symptoms do not improve or worsen or seek emergency care

## 2020-04-08 NOTE — Assessment & Plan Note (Signed)
Multifocal pneumonia secondary to COVID-19 with prolonged hospitalization.  Patient is making slow clinical recovery. She does have decreased oxygen demands today. We will continue on pulmonary hygiene regimen. Going forward we will try to get PFTs in 3 months.  Plan  Patient Instructions  Continue on Pulmicort neb Twice daily   Albuterol Neb Twice daily   And As needed   Continue on TRILOGY  At bedtime with Oxygen 3l/m  And with naps.  Mucinex DM Twice daily  Liquid As needed  Cough  Tessalon Three times a day  As needed  Cough . Decrease Oxygen 2l/m rest and 4l/m activity  PT as planned.  Follow up Hematology as planned  Follow up with Dr. Melida Quitter or Obinna Ehresman NP in 4-6 weeks and As needed   Please contact office for sooner follow up if symptoms do not improve or worsen or seek emergency care

## 2020-04-08 NOTE — Assessment & Plan Note (Signed)
Continue follow-up with primary care 

## 2020-04-08 NOTE — Assessment & Plan Note (Signed)
Bilateral PE -discovered during critical illness with COVID-19.  Patient was treated initially with anticoagulation however was unable to continue on this with Lovenox as patient has severe symptomatic anemia and a large obturator muscle hematoma.  Previous echo March 04, 2020 showed no right heart strain. Venous Dopplers were negative for DVT. Will need to follow closely, she has an upcoming appointment with hematology.  Plan  Patient Instructions  Continue on Pulmicort neb Twice daily   Albuterol Neb Twice daily   And As needed   Continue on TRILOGY  At bedtime with Oxygen 3l/m  And with naps.  Mucinex DM Twice daily  Liquid As needed  Cough  Tessalon Three times a day  As needed  Cough . Decrease Oxygen 2l/m rest and 4l/m activity  PT as planned.  Follow up Hematology as planned  Follow up with Dr. Melida Quitter or Harlin Mazzoni NP in 4-6 weeks and As needed   Please contact office for sooner follow up if symptoms do not improve or worsen or seek emergency care

## 2020-04-09 ENCOUNTER — Telehealth: Payer: Self-pay | Admitting: Pulmonary Disease

## 2020-04-09 MED ORDER — PREDNISONE 10 MG PO TABS: ORAL_TABLET | ORAL | 0 refills | Status: AC

## 2020-04-09 NOTE — Progress Notes (Signed)
Called and spoke with patient's daughter per patient request.  Advised of results/recommendations per Rubye Oaks NP.  She verbalized understanding.  Nothing further needed.

## 2020-04-09 NOTE — Telephone Encounter (Signed)
Call returned to patient, confirmed DOB.She reports someone already called her today regarding labs.   Nothing further needed at this time.

## 2020-04-09 NOTE — Progress Notes (Signed)
Called and spoke with patient's daughter per patient request, provided results/recommendations.  She verbalized understanding.  Nothing further needed.

## 2020-04-18 ENCOUNTER — Inpatient Hospital Stay (HOSPITAL_COMMUNITY): Payer: 59 | Attending: Hematology and Oncology | Admitting: Hematology and Oncology

## 2020-04-18 ENCOUNTER — Encounter (HOSPITAL_COMMUNITY): Payer: Self-pay | Admitting: Hematology and Oncology

## 2020-04-18 ENCOUNTER — Inpatient Hospital Stay (HOSPITAL_COMMUNITY): Payer: 59

## 2020-04-18 ENCOUNTER — Other Ambulatory Visit: Payer: Self-pay

## 2020-04-18 VITALS — BP 126/76 | HR 105 | Temp 97.8°F | Resp 18 | Wt 177.4 lb

## 2020-04-18 DIAGNOSIS — R0602 Shortness of breath: Secondary | ICD-10-CM | POA: Diagnosis not present

## 2020-04-18 DIAGNOSIS — I2699 Other pulmonary embolism without acute cor pulmonale: Secondary | ICD-10-CM | POA: Insufficient documentation

## 2020-04-18 DIAGNOSIS — Z9981 Dependence on supplemental oxygen: Secondary | ICD-10-CM | POA: Diagnosis not present

## 2020-04-18 DIAGNOSIS — Z8616 Personal history of COVID-19: Secondary | ICD-10-CM | POA: Diagnosis not present

## 2020-04-18 DIAGNOSIS — Z87891 Personal history of nicotine dependence: Secondary | ICD-10-CM | POA: Diagnosis not present

## 2020-04-18 LAB — CBC WITH DIFFERENTIAL/PLATELET
Abs Immature Granulocytes: 0.08 10*3/uL — ABNORMAL HIGH (ref 0.00–0.07)
Basophils Absolute: 0 10*3/uL (ref 0.0–0.1)
Basophils Relative: 0 %
Eosinophils Absolute: 0 10*3/uL (ref 0.0–0.5)
Eosinophils Relative: 0 %
HCT: 40 % (ref 36.0–46.0)
Hemoglobin: 11.9 g/dL — ABNORMAL LOW (ref 12.0–15.0)
Immature Granulocytes: 1 %
Lymphocytes Relative: 5 %
Lymphs Abs: 0.7 10*3/uL (ref 0.7–4.0)
MCH: 29.2 pg (ref 26.0–34.0)
MCHC: 29.8 g/dL — ABNORMAL LOW (ref 30.0–36.0)
MCV: 98.3 fL (ref 80.0–100.0)
Monocytes Absolute: 0.2 10*3/uL (ref 0.1–1.0)
Monocytes Relative: 2 %
Neutro Abs: 11 10*3/uL — ABNORMAL HIGH (ref 1.7–7.7)
Neutrophils Relative %: 92 %
Platelets: 441 10*3/uL — ABNORMAL HIGH (ref 150–400)
RBC: 4.07 MIL/uL (ref 3.87–5.11)
RDW: 15.5 % (ref 11.5–15.5)
WBC: 12 10*3/uL — ABNORMAL HIGH (ref 4.0–10.5)
nRBC: 0 % (ref 0.0–0.2)

## 2020-04-18 MED ORDER — APIXABAN 5 MG PO TABS: 5.0000 mg | ORAL_TABLET | Freq: Two times a day (BID) | ORAL | 0 refills | Status: DC

## 2020-04-18 NOTE — Progress Notes (Signed)
[facility] CONSULT NOTE  Patient Care Team: Avon Gully, MD as PCP - General (Internal Medicine) Jena Gauss, Gerrit Friends, MD as Consulting Physician (Gastroenterology)  CHIEF COMPLAINTS/PURPOSE OF CONSULTATION:  PE provoked by COVID-19  ASSESSMENT & PLAN:  No problem-specific Assessment & Plan notes found for this encounter.  No orders of the defined types were placed in this encounter.  1.  Provoked pulmonary emboli by COVID-19, no evidence of heart strain on the initial CT scan.  She was tried on anticoagulation with Lovenox but had pelvic muscle hematoma hence anticoagulation was stopped.  She is currently about 2 months since her diagnosis of pulmonary embolism.  I discussed that ideally patients with Covid induced pulmonary emboli need anticoagulation for 3 months.  We have discussed couple options. First option, patient will continue to monitor her respiratory status since she has not been taking her blood thinner for the past 2 months anyways and if she has worsening respiratory status, she will head to the nearest emergency room.  She will have imaging and if this shows any worsening of clot, she may have to go back on the blood thinners. Second option, patient will start Eliquis, monitor her CBC since she is worried about rebleeding.  Again I have reassured her that the circumstances are different.  COVID-19 patients have altered coagulation parameters and this may have increased her risk of bleeding while she was in the hospital.  This does not necessarily mean that she will bleed every time she takes a blood thinner but this is a potential risk with any blood thinners. She chose to try the second option and start blood thinners because she is still quite sedentary and has increased risk of reclotting with her diminished mobilization and also given the possibility that blood thinners may indeed help her improve from the oxygenation standpoint.  She was clearly taught about the  increased risk of bleeding on the blood thinners.   PLAN Eliquis prescribed CBC every 2 weeks FU in 4 weeks. Head to the nearest ER with any worsening symptoms or concerns  Thank you for consulting [place] in the care of this patient.  Please do not hesitate to contact us with any additional questions or concerns.  HISTORY OF PRESENTING ILLNESS:   Christina Vincent 60 y.o. female is here because of provoked PE.  This is a very pleasant 60 year old female patient with no significant past medical history who was admitted from December 27 through March 21, 2020 for acute hypoxemic respiratory failure secondary to COVID-19, pulmonary emboli now referred to hematology for anticoagulation recommendations.  She was started on anticoagulation but was found to have downtrending hemoglobin and CT abdomen pelvis showed pelvic muscle hematoma hence the anticoagulation was stopped.  She continues to have shortness of breath and has been needing at least 2 to 3 L of oxygen since discharge.  She is still very sedentary and cannot move around, she is still learning how to walk and feels very weak.  She denies any chest pain or cough today worsening lower extremity swelling.  No prior history of DVT/PE.  No family history of hypercoagulable disorders.  Overall, pretty debilitated from a long hospitalization and slowly recovering, making progress every week.  REVIEW OF SYSTEMS:   Constitutional: Denies fevers, chills or abnormal night sweats Eyes: Denies blurriness of vision, double vision or watery eyes Ears, nose, mouth, throat, and face: Denies mucositis or sore throat Respiratory: As mentioned above cardiovascular: Denies palpitation, chest discomfort or lower extremity  swelling Gastrointestinal:  Denies nausea, heartburn or change in bowel habits Skin: Denies abnormal skin rashes Lymphatics: Denies new lymphadenopathy or easy bruising Neurological:Denies numbness, tingling or new  weaknesses Behavioral/Psych: Mood is stable, no new changes  All other systems were reviewed with the patient and are negative.  MEDICAL HISTORY:  Past Medical History:  Diagnosis Date  . Depression   . Dyslipidemia     SURGICAL HISTORY: Past Surgical History:  Procedure Laterality Date  . COLONOSCOPY     1990's; hemorrhoids    SOCIAL HISTORY: Social History   Socioeconomic History  . Marital status: Married    Spouse name: Not on file  . Number of children: Not on file  . Years of education: Not on file  . Highest education level: Not on file  Occupational History  . Not on file  Tobacco Use  . Smoking status: Former Smoker    Packs/day: 1.00    Years: 8.00    Pack years: 8.00    Quit date: 03/24/1986    Years since quitting: 34.0  . Smokeless tobacco: Never Used  . Tobacco comment: quit 1988  Vaping Use  . Vaping Use: Never used  Substance and Sexual Activity  . Alcohol use: Yes    Comment: occ glass of wine  . Drug use: Never  . Sexual activity: Not on file  Other Topics Concern  . Not on file  Social History Narrative  . Not on file   Social Determinants of Health   Financial Resource Strain: Not on file  Food Insecurity: Not on file  Transportation Needs: Not on file  Physical Activity: Not on file  Stress: Not on file  Social Connections: Not on file  Intimate Partner Violence: Not At Risk  . Fear of Current or Ex-Partner: No  . Emotionally Abused: No  . Physically Abused: No  . Sexually Abused: No    FAMILY HISTORY: Family History  Problem Relation Age of Onset  . Alcohol abuse Father   . Colon cancer Neg Hx     ALLERGIES:  has No Known Allergies.  MEDICATIONS:  Current Outpatient Medications  Medication Sig Dispense Refill  . albuterol (PROVENTIL) (2.5 MG/3ML) 0.083% nebulizer solution Take 3 mLs (2.5 mg total) by nebulization every 4 (four) hours as needed for wheezing or shortness of breath. 75 mL 12  . atorvastatin (LIPITOR) 10  MG tablet Take 10 mg by mouth daily.    . benzonatate (TESSALON) 200 MG capsule Take 1 capsule (200 mg total) by mouth 3 (three) times daily as needed for cough. 30 capsule 1  . budesonide (PULMICORT) 0.5 MG/2ML nebulizer solution Take 2 mLs (0.5 mg total) by nebulization 2 (two) times daily. 2 mL 12  . chlorhexidine (PERIDEX) 0.12 % solution 15 mLs by Mouth Rinse route 2 (two) times daily. 120 mL 0  . D3-50 1.25 MG (50000 UT) capsule Take 50,000 Units by mouth every 30 (thirty) days.    Marland Kitchen gabapentin (NEURONTIN) 100 MG capsule Take 1 capsule (100 mg total) by mouth 2 (two) times daily. 60 capsule 1  . guaiFENesin-codeine 100-10 MG/5ML syrup Take 10 mLs by mouth every 4 (four) hours as needed for cough. 120 mL 0  . hydrocortisone (ANUSOL-HC) 2.5 % rectal cream Place rectally 2 (two) times daily as needed for hemorrhoids or anal itching. 30 g 0  . lip balm (CARMEX) ointment Apply topically as needed for lip care. 7 g 0  . magic mouthwash SOLN Take 15 mLs by mouth  3 (three) times daily as needed for mouth pain. 15 mL 0  . metFORMIN (GLUCOPHAGE) 850 MG tablet Take 850 mg by mouth 2 (two) times daily.    . mirtazapine (REMERON) 15 MG tablet Take 1 tablet (15 mg total) by mouth at bedtime. 30 tablet 2  . omeprazole (PRILOSEC) 20 MG capsule Take 20 mg by mouth daily.    . polyethylene glycol (MIRALAX / GLYCOLAX) 17 g packet Take 17 g by mouth daily as needed for moderate constipation. 14 each 0  . potassium chloride (KLOR-CON) 10 MEQ tablet Take 1 tablet (10 mEq total) by mouth daily. 30 tablet 0  . predniSONE (DELTASONE) 10 MG tablet Take 2 tablets (20 mg total) by mouth daily with breakfast for 7 days, THEN 1 tablet (10 mg total) daily with breakfast for 7 days, THEN 0.5 tablets (5 mg total) daily with breakfast for 7 days. 24 tablet 0  . predniSONE (DELTASONE) 5 MG tablet Take 1 tablet (5 mg total) by mouth daily with breakfast. 2 tablet 0  . promethazine-dextromethorphan (PROMETHAZINE-DM) 6.25-15  MG/5ML syrup Take 5 mLs by mouth 4 (four) times daily as needed.    Marland Kitchen. QUEtiapine (SEROQUEL) 25 MG tablet Take 1 tablet (25 mg total) by mouth 2 (two) times daily. 60 tablet 2  . senna-docusate (SENOKOT-S) 8.6-50 MG tablet Take 2 tablets by mouth 2 (two) times daily. 30 tablet 0  . traZODone (DESYREL) 50 MG tablet Take 1 tablet (50 mg total) by mouth at bedtime as needed for sleep. 30 tablet 2   No current facility-administered medications for this visit.     PHYSICAL EXAMINATION: ECOG PERFORMANCE STATUS: 2 - Symptomatic, <50% confined to bed  Vitals:   04/18/20 1319  BP: 126/76  Pulse: (!) 105  Resp: 18  Temp: 97.8 F (36.6 C)  SpO2: 100%   Filed Weights   04/18/20 1319  Weight: 177 lb 6 oz (80.5 kg)    GENERAL:alert, no distress and comfortable, in a wheelchair SKIN: skin color, texture, turgor are normal, no rashes or significant lesions EYES: normal, conjunctiva are pink and non-injected, sclera clear OROPHARYNX:no exudate, no erythema and lips, buccal mucosa, and tongue normal  NECK: supple, thyroid normal size, non-tender, without nodularity LYMPH:  no palpable lymphadenopathy in the cervical, axillary or inguinal LUNGS: clear to auscultation and percussion with normal breathing effort, was on at least 2 to 3 L of oxygen HEART: Sinus tachycardia, no lower extremity edema ABDOMEN:abdomen soft, non-tender and normal bowel sounds Musculoskeletal:no cyanosis of digits and no clubbing  PSYCH: alert & oriented x 3 with fluent speech NEURO: no focal motor/sensory deficits  LABORATORY DATA:  I have reviewed the data as listed Lab Results  Component Value Date   WBC 12.8 (H) 03/24/2020   HGB 10.7 (L) 03/24/2020   HCT 34.2 (L) 03/24/2020   MCV 94.9 03/24/2020   PLT 401.0 (H) 03/24/2020     Chemistry      Component Value Date/Time   NA 141 04/07/2020 1531   K 3.5 04/07/2020 1531   CL 98 04/07/2020 1531   CO2 36 (H) 04/07/2020 1531   BUN 3 (L) 04/07/2020 1531    CREATININE 0.33 (L) 04/07/2020 1531      Component Value Date/Time   CALCIUM 8.7 04/07/2020 1531   ALKPHOS 65 03/20/2020 0518   AST 19 03/20/2020 0518   ALT 32 03/20/2020 0518   BILITOT 1.0 03/20/2020 0518       RADIOGRAPHIC STUDIES: I have personally reviewed the radiological  images as listed and agreed with the findings in the report. DG Chest 2 View  Result Date: 04/08/2020 CLINICAL DATA:  Pulmonary embolism EXAM: CHEST - 2 VIEW COMPARISON:  03/13/2020 FINDINGS: Borderline enlargement of cardiac silhouette. Mediastinal contours normal. Patchy BILATERAL pulmonary infiltrates asymmetrically greater on LEFT consistent with multifocal pneumonia. No pleural effusion or pneumothorax. Scattered endplate spur formation thoracic spine. IMPRESSION: Patchy BILATERAL pulmonary infiltrates consistent with multifocal pneumonia, asymmetrically rater on LEFT, slightly increased since 03/13/2020. Electronically Signed   By: Ulyses Southward M.D.   On: 04/08/2020 09:58    All questions were answered. The patient knows to call the clinic with any problems, questions or concerns. I spent 45 minutes in the care of this patient including H and P, review of records, counseling and coordination of care.     Rachel Moulds, MD 04/18/2020 1:45 PM

## 2020-04-28 ENCOUNTER — Ambulatory Visit: Payer: 59 | Admitting: Pulmonary Disease

## 2020-05-06 ENCOUNTER — Ambulatory Visit (INDEPENDENT_AMBULATORY_CARE_PROVIDER_SITE_OTHER): Payer: 59 | Admitting: Pulmonary Disease

## 2020-05-06 ENCOUNTER — Encounter: Payer: Self-pay | Admitting: Pulmonary Disease

## 2020-05-06 ENCOUNTER — Ambulatory Visit: Payer: 59 | Admitting: Pulmonary Disease

## 2020-05-06 ENCOUNTER — Other Ambulatory Visit: Payer: Self-pay

## 2020-05-06 VITALS — BP 124/84 | HR 118 | Temp 97.6°F | Ht 68.0 in | Wt 181.8 lb

## 2020-05-06 DIAGNOSIS — I2699 Other pulmonary embolism without acute cor pulmonale: Secondary | ICD-10-CM | POA: Diagnosis not present

## 2020-05-06 DIAGNOSIS — R0602 Shortness of breath: Secondary | ICD-10-CM | POA: Diagnosis not present

## 2020-05-06 NOTE — Patient Instructions (Addendum)
Continue graded exercises as tolerated  Continue using the trilogy ventilator at night  It is safe to restart your blood thinners -Monitor for any bleeding  I will see you back in 6 weeks  Try and keep your oxygen above 90 at all times  I am going to order an echocardiogram to check on your heart-just to make sure things are continuing to improve-fast heart rate in the shortness of breath

## 2020-05-06 NOTE — Progress Notes (Addendum)
Christina Vincent    [id number]    [birth year]  Primary Care Physician:Fanta, Ninetta Lights, MD  Referring Physician: Avon Gully, MD [address] [place],  [place] [address]  Chief complaint:   Patient in for follow-up today  HPI:  60 year old lady with history of diabetes, obesity, hypertension, hyperlipidemia Recently hospitalized for COVID-19 in December 2021, about 6 weeks hospitalization Did have bilateral PEs, severe anemia, MRSA pneumonia, severe anxiety post Covid  Is doing relatively better  Came into the visit in a wheelchair Recently completed physical therapy  She remains on oxygen supplementation On a trilogy vent at night -ABG while in the hospital showed hypercapnic respiratory failure  She is continuing to improve  Medications for anxiety  Download from a trilogy event not available today-DME called  Outpatient Encounter Medications as of 05/06/2020  Medication Sig  . albuterol (PROVENTIL) (2.5 MG/3ML) 0.083% nebulizer solution Take 3 mLs (2.5 mg total) by nebulization every 4 (four) hours as needed for wheezing or shortness of breath.  Marland Kitchen apixaban (ELIQUIS) 5 MG TABS tablet Take 1 tablet (5 mg total) by mouth 2 (two) times daily.  Marland Kitchen atorvastatin (LIPITOR) 10 MG tablet Take 10 mg by mouth daily.  . benzonatate (TESSALON) 200 MG capsule Take 1 capsule (200 mg total) by mouth 3 (three) times daily as needed for cough.  . budesonide (PULMICORT) 0.5 MG/2ML nebulizer solution Take 2 mLs (0.5 mg total) by nebulization 2 (two) times daily.  . chlorhexidine (PERIDEX) 0.12 % solution 15 mLs by Mouth Rinse route 2 (two) times daily.  . D3-50 1.25 MG (50000 UT) capsule Take 50,000 Units by mouth every 30 (thirty) days.  Marland Kitchen gabapentin (NEURONTIN) 100 MG capsule Take 1 capsule (100 mg total) by mouth 2 (two) times daily.  Marland Kitchen guaiFENesin-codeine 100-10 MG/5ML syrup Take 10 mLs by mouth every 4 (four) hours as needed for cough.  . hydrocortisone (ANUSOL-HC) 2.5 %  rectal cream Place rectally 2 (two) times daily as needed for hemorrhoids or anal itching.  . lip balm (CARMEX) ointment Apply topically as needed for lip care.  . losartan (COZAAR) 50 MG tablet Take 50 mg by mouth daily.  . magic mouthwash SOLN Take 15 mLs by mouth 3 (three) times daily as needed for mouth pain.  . metFORMIN (GLUCOPHAGE) 850 MG tablet Take 850 mg by mouth 2 (two) times daily.  . mirtazapine (REMERON) 15 MG tablet Take 1 tablet (15 mg total) by mouth at bedtime.  Marland Kitchen omeprazole (PRILOSEC) 20 MG capsule Take 20 mg by mouth daily.  . polyethylene glycol (MIRALAX / GLYCOLAX) 17 g packet Take 17 g by mouth daily as needed for moderate constipation.  . potassium chloride (KLOR-CON) 10 MEQ tablet Take 1 tablet (10 mEq total) by mouth daily.  . predniSONE (DELTASONE) 5 MG tablet Take 1 tablet (5 mg total) by mouth daily with breakfast.  . promethazine-dextromethorphan (PROMETHAZINE-DM) 6.25-15 MG/5ML syrup Take 5 mLs by mouth 4 (four) times daily as needed.  Marland Kitchen QUEtiapine (SEROQUEL) 25 MG tablet Take 1 tablet (25 mg total) by mouth 2 (two) times daily.  Marland Kitchen senna-docusate (SENOKOT-S) 8.6-50 MG tablet Take 2 tablets by mouth 2 (two) times daily.  . traZODone (DESYREL) 50 MG tablet Take 1 tablet (50 mg total) by mouth at bedtime as needed for sleep.   No facility-administered encounter medications on file as of 05/06/2020.    Allergies as of 05/06/2020  . (No Known Allergies)    Past Medical History:  Diagnosis Date  . Depression   . Dyslipidemia     Past Surgical History:  Procedure Laterality Date  . COLONOSCOPY     1990's; hemorrhoids    Family History  Problem Relation Age of Onset  . Alcohol abuse Father   . Colon cancer Neg Hx     Social History   Socioeconomic History  . Marital status: Married    Spouse name: Not on file  . Number of children: Not on file  . Years of education: Not on file  . Highest education level: Not on file  Occupational History  . Not  on file  Tobacco Use  . Smoking status: Former Smoker    Packs/day: 1.00    Years: 8.00    Pack years: 8.00    Quit date: 03/24/1986    Years since quitting: 34.1  . Smokeless tobacco: Never Used  . Tobacco comment: quit 1988  Vaping Use  . Vaping Use: Never used  Substance and Sexual Activity  . Alcohol use: Yes    Comment: occ glass of wine  . Drug use: Never  . Sexual activity: Not on file  Other Topics Concern  . Not on file  Social History Narrative  . Not on file   Social Determinants of Health   Financial Resource Strain: Not on file  Food Insecurity: Not on file  Transportation Needs: Not on file  Physical Activity: Not on file  Stress: Not on file  Social Connections: Not on file  Intimate Partner Violence: Not At Risk  . Fear of Current or Ex-Partner: No  . Emotionally Abused: No  . Physically Abused: No  . Sexually Abused: No    Review of Systems  Constitutional: Positive for fatigue.  Respiratory: Positive for shortness of breath.   Cardiovascular: Positive for palpitations.  Psychiatric/Behavioral: Positive for sleep disturbance.    Vitals:   05/06/20 1439  BP: 124/84  Pulse: (!) 118  Temp: 97.6 F (36.4 C)  SpO2: 100%     Physical Exam Constitutional:      Appearance: Normal appearance.  HENT:     Head: Normocephalic.     Nose: No congestion.     Mouth/Throat:     Mouth: Mucous membranes are moist.  Eyes:     General:        Right eye: No discharge.        Left eye: No discharge.     Pupils: Pupils are equal, round, and reactive to light.  Cardiovascular:     Rate and Rhythm: Normal rate and regular rhythm.     Pulses: Normal pulses.     Heart sounds: No murmur heard. No friction rub.  Pulmonary:     Effort: No respiratory distress.     Breath sounds: No stridor. No wheezing or rhonchi.  Musculoskeletal:     Cervical back: No rigidity or tenderness.  Neurological:     Mental Status: She is alert.  Psychiatric:        Mood  and Affect: Mood normal.    Data Reviewed: CT chest from February 12, 2020 reviewed by myself CT scan from 03/14/2020 reviewed by myself Echocardiogram 03/04/2020 with diastolic dysfunction  Assessment:  .  Hypercapnic respiratory failure -Continue oxygen supplementation -Continue continue trilogy ventilator at night -Check oxygen on a regular basis and try and keep saturations just above 90  .  Severe deconditioning -Continue rehab therapy  .  Pulmonary embolism -Complicated by bleeding while in the hospital -I think  it safe to restart and monitor for bleeding  Obstructive lung disease -Continue bronchodilator treatments -Continue Pulmicort  Plan/Recommendations: Continue long-term ventilator use Continue bronchodilators Exercise as tolerated Optimize medications for management of anxiety Continue oxygen supplementation  Encouraged to call with any significant concerns  I spent 30 minutes dedicated to the care of this patient on the date of this encounter to include previsit review of records, face-to-face time with the patient discussing conditions above, post visit ordering of testing, clinical documentation with electronic health record and communicated necessary findings to members of the patient's care team   Virl Diamond MD [facility] 05/06/2020, 2:42 PM  CC: Avon Gully, MD  Trilogy ventilator download noted -Will scan to chart

## 2020-05-21 ENCOUNTER — Ambulatory Visit: Payer: Self-pay | Admitting: Gastroenterology

## 2020-05-21 ENCOUNTER — Encounter: Payer: Self-pay | Admitting: Internal Medicine

## 2020-05-22 ENCOUNTER — Ambulatory Visit (HOSPITAL_COMMUNITY): Payer: 59 | Admitting: Hematology

## 2020-05-26 ENCOUNTER — Inpatient Hospital Stay (HOSPITAL_COMMUNITY): Payer: 59

## 2020-05-26 ENCOUNTER — Other Ambulatory Visit (HOSPITAL_COMMUNITY): Payer: 59

## 2020-06-02 ENCOUNTER — Ambulatory Visit: Payer: 59 | Admitting: Pulmonary Disease

## 2020-06-06 ENCOUNTER — Other Ambulatory Visit: Payer: Self-pay | Admitting: Adult Health

## 2020-06-10 ENCOUNTER — Inpatient Hospital Stay (HOSPITAL_COMMUNITY): Payer: 59

## 2020-06-10 ENCOUNTER — Other Ambulatory Visit: Payer: Self-pay

## 2020-06-10 ENCOUNTER — Inpatient Hospital Stay (HOSPITAL_COMMUNITY): Payer: 59 | Attending: Hematology | Admitting: Hematology

## 2020-06-10 ENCOUNTER — Other Ambulatory Visit (HOSPITAL_COMMUNITY): Payer: Self-pay | Admitting: *Deleted

## 2020-06-10 VITALS — BP 121/77 | HR 108 | Temp 97.0°F | Resp 20

## 2020-06-10 DIAGNOSIS — I2694 Multiple subsegmental pulmonary emboli without acute cor pulmonale: Secondary | ICD-10-CM | POA: Diagnosis not present

## 2020-06-10 DIAGNOSIS — Z993 Dependence on wheelchair: Secondary | ICD-10-CM | POA: Diagnosis not present

## 2020-06-10 DIAGNOSIS — Z8616 Personal history of COVID-19: Secondary | ICD-10-CM | POA: Insufficient documentation

## 2020-06-10 DIAGNOSIS — I2699 Other pulmonary embolism without acute cor pulmonale: Secondary | ICD-10-CM | POA: Insufficient documentation

## 2020-06-10 DIAGNOSIS — Z9981 Dependence on supplemental oxygen: Secondary | ICD-10-CM | POA: Insufficient documentation

## 2020-06-10 LAB — CBC WITH DIFFERENTIAL/PLATELET
Abs Immature Granulocytes: 0.02 10*3/uL (ref 0.00–0.07)
Basophils Absolute: 0 10*3/uL (ref 0.0–0.1)
Basophils Relative: 0 %
Eosinophils Absolute: 0.1 10*3/uL (ref 0.0–0.5)
Eosinophils Relative: 2 %
HCT: 36 % (ref 36.0–46.0)
Hemoglobin: 11.2 g/dL — ABNORMAL LOW (ref 12.0–15.0)
Immature Granulocytes: 0 %
Lymphocytes Relative: 20 %
Lymphs Abs: 1.2 10*3/uL (ref 0.7–4.0)
MCH: 28.5 pg (ref 26.0–34.0)
MCHC: 31.1 g/dL (ref 30.0–36.0)
MCV: 91.6 fL (ref 80.0–100.0)
Monocytes Absolute: 0.5 10*3/uL (ref 0.1–1.0)
Monocytes Relative: 8 %
Neutro Abs: 4.4 10*3/uL (ref 1.7–7.7)
Neutrophils Relative %: 70 %
Platelets: 244 10*3/uL (ref 150–400)
RBC: 3.93 MIL/uL (ref 3.87–5.11)
RDW: 13.8 % (ref 11.5–15.5)
WBC: 6.3 10*3/uL (ref 4.0–10.5)
nRBC: 0 % (ref 0.0–0.2)

## 2020-06-10 LAB — COMPREHENSIVE METABOLIC PANEL
ALT: 29 U/L (ref 0–44)
AST: 35 U/L (ref 15–41)
Albumin: 3.9 g/dL (ref 3.5–5.0)
Alkaline Phosphatase: 64 U/L (ref 38–126)
Anion gap: 6 (ref 5–15)
BUN: 6 mg/dL (ref 6–20)
CO2: 31 mmol/L (ref 22–32)
Calcium: 8.9 mg/dL (ref 8.9–10.3)
Chloride: 103 mmol/L (ref 98–111)
Creatinine, Ser: 0.48 mg/dL (ref 0.44–1.00)
GFR, Estimated: >60 mL/min (ref >60)
Glucose, Bld: 113 mg/dL — ABNORMAL HIGH (ref 70–99)
Potassium: 3.7 mmol/L (ref 3.5–5.1)
Sodium: 140 mmol/L (ref 135–145)
Total Bilirubin: 0.5 mg/dL (ref 0.3–1.2)
Total Protein: 7.3 g/dL (ref 6.5–8.1)

## 2020-06-10 LAB — D-DIMER, QUANTITATIVE: D-Dimer, Quant: 0.39 ug/mL-FEU (ref 0.00–0.50)

## 2020-06-10 NOTE — Progress Notes (Signed)
[facility] [address], [place] [address]   CLINIC:  Medical Oncology/Hematology  PCP:  Avon Gully, MD [address] [place] [employer] / [place] [address]  [phone]  REASON FOR VISIT:  Follow-up for bilateral PE post-COVID  PRIOR THERAPY: Lovenox  CURRENT THERAPY: Eliquis 5 mg BID  INTERVAL HISTORY:  Ms. Christina Vincent, a 60 y.o. female, returns for routine follow-up for her bilateral PE post-COVID. Christina Vincent was last seen by Dr. Burnice Logan Iruku on 04/18/2020.  Today Christina Vincent is accompanied by her daughter and Christina Vincent reports feeling okay. Christina Vincent reports that Christina Vincent developed the PE after being diagnosed with COVID. Christina Vincent has not started taking Eliquis since Christina Vincent wanted to discuss it with Dr. Kirtland Bouchard. Christina Vincent reports having mild SOB and was started on supplemental oxygen since PE diagnosis. Christina Vincent denies having abnormal bleeding prior to the bleeding from the Lovenox. Christina Vincent reports that Christina Vincent was active before the COVID diagnosis and is trying to increase her endurance; Christina Vincent is trying to do more and more of her chores and ADL's.  Christina Vincent is scheduled to have an echo on 05/13.   REVIEW OF SYSTEMS:  Review of Systems  Constitutional: Positive for fatigue (50%). Negative for appetite change.  Respiratory: Positive for shortness of breath.   Psychiatric/Behavioral: The patient is nervous/anxious.   All other systems reviewed and are negative.   PAST MEDICAL/SURGICAL HISTORY:  Past Medical History:  Diagnosis Date  . Depression   . Dyslipidemia    Past Surgical History:  Procedure Laterality Date  . COLONOSCOPY     1990's; hemorrhoids    SOCIAL HISTORY:  Social History   Socioeconomic History  . Marital status: Married    Spouse name: Not on file  . Number of children: Not on file  . Years of education: Not on file  . Highest education level: Not on file  Occupational History  . Not on file  Tobacco Use  . Smoking status: Former Smoker    Packs/day: 1.00    Years: 8.00    Pack  years: 8.00    Quit date: 03/24/1986    Years since quitting: 34.2  . Smokeless tobacco: Never Used  . Tobacco comment: quit 1988  Vaping Use  . Vaping Use: Never used  Substance and Sexual Activity  . Alcohol use: Yes    Comment: occ glass of wine  . Drug use: Never  . Sexual activity: Not on file  Other Topics Concern  . Not on file  Social History Narrative  . Not on file   Social Determinants of Health   Financial Resource Strain: Not on file  Food Insecurity: Not on file  Transportation Needs: Not on file  Physical Activity: Not on file  Stress: Not on file  Social Connections: Not on file  Intimate Partner Violence: Not At Risk  . Fear of Current or Ex-Partner: No  . Emotionally Abused: No  . Physically Abused: No  . Sexually Abused: No    FAMILY HISTORY:  Family History  Problem Relation Age of Onset  . Alcohol abuse Father   . Colon cancer Neg Hx     CURRENT MEDICATIONS:  Current Outpatient Medications  Medication Sig Dispense Refill  . albuterol (PROVENTIL) (2.5 MG/3ML) 0.083% nebulizer solution Take 3 mLs (2.5 mg total) by nebulization every 4 (four) hours as needed for wheezing or shortness of breath. 75 mL 12  . apixaban (ELIQUIS) 5 MG TABS tablet Take 1 tablet (5 mg total) by mouth 2 (  two) times daily. 60 tablet 0  . atorvastatin (LIPITOR) 10 MG tablet Take 10 mg by mouth daily.    . benzonatate (TESSALON) 200 MG capsule Take 1 capsule by mouth three times daily as needed for cough 30 capsule 0  . budesonide (PULMICORT) 0.5 MG/2ML nebulizer solution Take 2 mLs (0.5 mg total) by nebulization 2 (two) times daily. 2 mL 12  . chlorhexidine (PERIDEX) 0.12 % solution 15 mLs by Mouth Rinse route 2 (two) times daily. 120 mL 0  . D3-50 1.25 MG (50000 UT) capsule Take 50,000 Units by mouth every 30 (thirty) days.    Marland Kitchen gabapentin (NEURONTIN) 100 MG capsule Take 1 capsule (100 mg total) by mouth 2 (two) times daily. 60 capsule 1  . guaiFENesin-codeine 100-10 MG/5ML  syrup Take 10 mLs by mouth every 4 (four) hours as needed for cough. 120 mL 0  . hydrocortisone (ANUSOL-HC) 2.5 % rectal cream Place rectally 2 (two) times daily as needed for hemorrhoids or anal itching. 30 g 0  . lip balm (CARMEX) ointment Apply topically as needed for lip care. 7 g 0  . losartan (COZAAR) 50 MG tablet Take 50 mg by mouth daily.    . magic mouthwash SOLN Take 15 mLs by mouth 3 (three) times daily as needed for mouth pain. 15 mL 0  . metFORMIN (GLUCOPHAGE) 850 MG tablet Take 850 mg by mouth 2 (two) times daily.    . mirtazapine (REMERON) 15 MG tablet Take 1 tablet (15 mg total) by mouth at bedtime. 30 tablet 2  . omeprazole (PRILOSEC) 20 MG capsule Take 20 mg by mouth daily.    . polyethylene glycol (MIRALAX / GLYCOLAX) 17 g packet Take 17 g by mouth daily as needed for moderate constipation. 14 each 0  . potassium chloride (KLOR-CON) 10 MEQ tablet Take 1 tablet (10 mEq total) by mouth daily. 30 tablet 0  . predniSONE (DELTASONE) 5 MG tablet Take 1 tablet (5 mg total) by mouth daily with breakfast. 2 tablet 0  . promethazine-dextromethorphan (PROMETHAZINE-DM) 6.25-15 MG/5ML syrup Take 5 mLs by mouth 4 (four) times daily as needed.    Marland Kitchen QUEtiapine (SEROQUEL) 25 MG tablet Take 1 tablet (25 mg total) by mouth 2 (two) times daily. 60 tablet 2  . senna-docusate (SENOKOT-S) 8.6-50 MG tablet Take 2 tablets by mouth 2 (two) times daily. 30 tablet 0  . traZODone (DESYREL) 50 MG tablet Take 1 tablet (50 mg total) by mouth at bedtime as needed for sleep. 30 tablet 2   No current facility-administered medications for this visit.    ALLERGIES:  No Known Allergies  PHYSICAL EXAM:  Performance status (ECOG): 2 - Symptomatic, <50% confined to bed  Vitals:   06/10/20 1336  BP: 121/77  Pulse: (!) 108  Resp: 20  Temp: (!) 97 F (36.1 C)  SpO2: 100%   Wt Readings from Last 3 Encounters:  05/06/20 181 lb 12.8 oz (82.5 kg)  04/18/20 177 lb 6 oz (80.5 kg)  04/07/20 170 lb (77.1 kg)    Physical Exam Vitals reviewed.  Constitutional:      Appearance: Normal appearance.     Interventions: Nasal cannula in place.     Comments: In wheelchair  Cardiovascular:     Rate and Rhythm: Normal rate and regular rhythm.     Pulses: Normal pulses.     Heart sounds: Normal heart sounds.  Pulmonary:     Effort: Pulmonary effort is normal.     Breath sounds: Normal breath sounds.  Neurological:     General: No focal deficit present.     Mental Status: Christina Vincent is alert and oriented to person, place, and time.  Psychiatric:        Mood and Affect: Mood normal.        Behavior: Behavior normal.     LABORATORY DATA:  I have reviewed the labs as listed.  CBC Latest Ref Rng & Units 06/10/2020 04/18/2020 03/24/2020  WBC 4.0 - 10.5 K/uL 6.3 12.0(H) 12.8(H)  Hemoglobin 12.0 - 15.0 g/dL 11.2(L) 11.9(L) 10.7(L)  Hematocrit 36.0 - 46.0 % 36.0 40.0 34.2(L)  Platelets 150 - 400 K/uL 244 441(H) 401.0(H)   CMP Latest Ref Rng & Units 04/07/2020 03/24/2020 03/20/2020  Glucose 70 - 99 mg/dL 301(S) 010(X) 323(F)  BUN 6 - 23 mg/dL 3(L) 4(L) 5(L)  Creatinine 0.40 - 1.20 mg/dL 5.73(U) 2.02(R) 4.27(C)  Sodium 135 - 145 mEq/L 141 139 141  Potassium 3.5 - 5.1 mEq/L 3.5 3.3(L) 3.7  Chloride 96 - 112 mEq/L 98 98 99  CO2 19 - 32 mEq/L 36(H) 35(H) 32  Calcium 8.4 - 10.5 mg/dL 8.7 9.2 6.2(B)  Total Protein 6.5 - 8.1 g/dL - - 5.5(L)  Total Bilirubin 0.3 - 1.2 mg/dL - - 1.0  Alkaline Phos 38 - 126 U/L - - 65  AST 15 - 41 U/L - - 19  ALT 0 - 44 U/L - - 32      Component Value Date/Time   RBC 3.93 06/10/2020 1316   MCV 91.6 06/10/2020 1316   MCH 28.5 06/10/2020 1316   MCHC 31.1 06/10/2020 1316   RDW 13.8 06/10/2020 1316   LYMPHSABS 1.2 06/10/2020 1316   MONOABS 0.5 06/10/2020 1316   EOSABS 0.1 06/10/2020 1316   BASOSABS 0.0 06/10/2020 1316    DIAGNOSTIC IMAGING:  I have independently reviewed the scans and discussed with the patient. No results found.   ASSESSMENT:  1.  Bilateral pulmonary  emboli provoked by COVID-19: - CT angiogram on 02/12/2020 with acute bilateral lower lobe and right middle lobe segmental pulmonary emboli with no evidence of right heart strain.  Multifocal pneumonia was seen. - Ultrasound on 02/12/2020 was negative for DVT. - Christina Vincent was hospitalized for 45 days at that time and received Lovenox as anticoagulation. - CT on 03/14/2020 of the abdomen showed left obturator internus intramuscular hematoma. - Anticoagulation was discontinued because of the hematoma.    PLAN:  1.  Bilateral pulmonary emboli provoked by COVID-19: - Christina Vincent is oxygen dependent since her COVID infection. - I have recommended repeating D-dimer and a CT scan PE protocol. - If both are negative, Christina Vincent does not need to go back on anticoagulation given her high risk of bleeding with intramuscular hematoma. - We will talk to her after the CT scan.   Orders placed this encounter:  Orders Placed This Encounter  Procedures  . D-dimer, quantitative     Doreatha Massed, MD [facility] [phone]   I, Drue Second, am acting as a scribe for Dr. Payton Mccallum.  I, Doreatha Massed MD, have reviewed the above documentation for accuracy and completeness, and I agree with the above.

## 2020-06-10 NOTE — Patient Instructions (Signed)
[facility] at [facility] Discharge Instructions  You were seen today by Dr. Ellin Saba. He went over your recent results and scans. You had additional blood drawn today for further analysis. You will be scheduled to have a CT angiogram of your chest done as quickly as possible. Dr. Ellin Saba will call you after the scan for follow up.   Thank you for choosing [facility] at [facility] to provide your oncology and hematology care.  To afford each patient quality time with our provider, please arrive at least 15 minutes before your scheduled appointment time.   If you have a lab appointment with the Cancer Center please come in thru the Main Entrance and check in at the main information desk  You need to re-schedule your appointment should you arrive 10 or more minutes late.  We strive to give you quality time with our providers, and arriving late affects you and other patients whose appointments are after yours.  Also, if you no show three or more times for appointments you may be dismissed from the clinic at the providers discretion.     Again, thank you for choosing [facility].  Our hope is that these requests will decrease the amount of time that you wait before being seen by our physicians.       _____________________________________________________________  Should you have questions after your visit to [facility], please contact our office at [phone] between the hours of 8:00 a.m. and 4:30 p.m.  Voicemails left after 4:00 p.m. will not be returned until the following business day.  For prescription refill requests, have your pharmacy contact our office and allow 72 hours.    Cancer Center Support Programs:   > Cancer Support Group  2nd Tuesday of the month 1pm-2pm, Journey Room

## 2020-06-10 NOTE — Progress Notes (Signed)
Orders placed for cmp for scans.

## 2020-06-11 ENCOUNTER — Ambulatory Visit (HOSPITAL_COMMUNITY)
Admission: RE | Admit: 2020-06-11 | Discharge: 2020-06-11 | Disposition: A | Discharge status: Home / Self Care | Payer: 59 | Source: Ambulatory Visit | Attending: Hematology | Admitting: Hematology

## 2020-06-11 DIAGNOSIS — I2694 Multiple subsegmental pulmonary emboli without acute cor pulmonale: Secondary | ICD-10-CM | POA: Insufficient documentation

## 2020-06-11 MED ORDER — IOHEXOL 350 MG/ML SOLN
100.0000 mL | Freq: Once | INTRAVENOUS | Status: AC | PRN
  Administered 2020-06-11: 100 mL via INTRAVENOUS

## 2020-06-12 ENCOUNTER — Other Ambulatory Visit: Payer: Self-pay

## 2020-06-12 ENCOUNTER — Inpatient Hospital Stay (HOSPITAL_BASED_OUTPATIENT_CLINIC_OR_DEPARTMENT_OTHER): Payer: 59 | Admitting: Hematology

## 2020-06-12 DIAGNOSIS — I2694 Multiple subsegmental pulmonary emboli without acute cor pulmonale: Secondary | ICD-10-CM

## 2020-06-12 NOTE — Progress Notes (Signed)
Virtual Visit via Telephone Note  I connected with Christina Vincent on 06/12/20 at  9:15 AM EDT by telephone and verified that I am speaking with the correct person using two identifiers.  Location: Patient: At home Provider: In office   I discussed the limitations, risks, security and privacy concerns of performing an evaluation and management service by telephone and the availability of in person appointments. I also discussed with the patient that there may be a patient responsible charge related to this service. The patient expressed understanding and agreed to proceed.   History of Present Illness: She was evaluated in our office for bilateral pulmonary embolism originally diagnosed on 02/12/2020.  This happened in the setting of COVID infection.   Observations/Objective: She is still oxygen dependent since her COVID infection.  Her functional status is slowly improving.  Appetite is reported as 100% and energy levels are 50%.  Assessment and Plan:  1.  Provoked bilateral PE in the setting of COVID-19: - She was treated with Lovenox which had to be stopped because of intramuscular hematoma on 03/14/2020. - We checked her D-dimer which was negative on 06/10/2020. - We reviewed results of the CT chest from 06/11/2020 which did not show any evidence of pulmonary embolism.  However it showed bilateral areas of subpleural postinflammatory scarring. - She does not have to go back on anticoagulation at this time. - We will plan to see her back in 3 months for follow-up with D-dimer.   Follow Up Instructions: RTC 3 months with D-dimer.   I discussed the assessment and treatment plan with the patient. The patient was provided an opportunity to ask questions and all were answered. The patient agreed with the plan and demonstrated an understanding of the instructions.   The patient was advised to call back or seek an in-person evaluation if the symptoms worsen or if the condition fails to improve as  anticipated.  I provided 11 minutes of non-face-to-face time during this encounter.   Doreatha Massed, MD

## 2020-06-17 ENCOUNTER — Ambulatory Visit: Payer: 59 | Admitting: Pulmonary Disease

## 2020-06-20 ENCOUNTER — Ambulatory Visit (HOSPITAL_COMMUNITY): Payer: 59 | Attending: Cardiovascular Disease

## 2020-06-20 ENCOUNTER — Other Ambulatory Visit: Payer: Self-pay

## 2020-06-20 DIAGNOSIS — I2699 Other pulmonary embolism without acute cor pulmonale: Secondary | ICD-10-CM | POA: Diagnosis not present

## 2020-06-20 DIAGNOSIS — R0602 Shortness of breath: Secondary | ICD-10-CM | POA: Diagnosis not present

## 2020-06-20 LAB — ECHOCARDIOGRAM COMPLETE
Area-P 1/2: 2.91 cm2
S' Lateral: 2.8 cm

## 2020-07-01 ENCOUNTER — Encounter: Payer: Self-pay | Admitting: Pulmonary Disease

## 2020-07-01 ENCOUNTER — Other Ambulatory Visit: Payer: Self-pay

## 2020-07-01 ENCOUNTER — Ambulatory Visit: Payer: 59 | Admitting: Pulmonary Disease

## 2020-07-01 VITALS — BP 124/84 | HR 107 | Temp 97.6°F | Ht 68.0 in | Wt 191.0 lb

## 2020-07-01 DIAGNOSIS — J9612 Chronic respiratory failure with hypercapnia: Secondary | ICD-10-CM

## 2020-07-01 DIAGNOSIS — J9611 Chronic respiratory failure with hypoxia: Secondary | ICD-10-CM | POA: Diagnosis not present

## 2020-07-01 DIAGNOSIS — R0602 Shortness of breath: Secondary | ICD-10-CM

## 2020-07-01 DIAGNOSIS — I2699 Other pulmonary embolism without acute cor pulmonale: Secondary | ICD-10-CM | POA: Diagnosis not present

## 2020-07-01 NOTE — Patient Instructions (Signed)
Continue current lines of care  You may take your oxygen off as long as you make sure your saturations are over 90%  Gradual increase activities will always continue to help  Your echocardiogram as we reviewed his normal  I will see you back in 3 months  Call with significant concerns

## 2020-07-01 NOTE — Progress Notes (Signed)
Christina Vincent    [id number]    May 11, 1960  Primary Care Physician:Fanta, Ninetta Lights, MD  Referring Physician: Avon Gully, MD [address] [place],  [place] [address]  Chief complaint:   Patient in for follow-up today Feeling better since her last visit  HPI:  60 year old lady with history of diabetes, obesity, hypertension, hyperlipidemia Recently hospitalized for COVID-19 in December 2021, about 6 weeks hospitalization Did have bilateral PEs, severe anemia, MRSA pneumonia, severe anxiety post Covid  She is doing much better Tolerating physical therapy well Still on oxygen supplementation  In a wheelchair at present -States she is able to walk 40-50 steps  She remains on oxygen supplementation On a trilogy vent at night -ABG while in the hospital showed hypercapnic respiratory failure  She is continuing to improve  Medications for anxiety  Download from a trilogy event not available today-will request for download from DME company  Outpatient Encounter Medications as of 07/01/2020  Medication Sig  . albuterol (PROVENTIL) (2.5 MG/3ML) 0.083% nebulizer solution Take 3 mLs (2.5 mg total) by nebulization every 4 (four) hours as needed for wheezing or shortness of breath.  Marland Kitchen apixaban (ELIQUIS) 5 MG TABS tablet Take 1 tablet (5 mg total) by mouth 2 (two) times daily.  Marland Kitchen atorvastatin (LIPITOR) 10 MG tablet Take 10 mg by mouth daily.  . benzonatate (TESSALON) 200 MG capsule Take 1 capsule by mouth three times daily as needed for cough  . budesonide (PULMICORT) 0.5 MG/2ML nebulizer solution Take 2 mLs (0.5 mg total) by nebulization 2 (two) times daily.  . chlorhexidine (PERIDEX) 0.12 % solution 15 mLs by Mouth Rinse route 2 (two) times daily.  . D3-50 1.25 MG (50000 UT) capsule Take 50,000 Units by mouth every 30 (thirty) days.  Marland Kitchen gabapentin (NEURONTIN) 100 MG capsule Take 1 capsule (100 mg total) by mouth 2 (two) times daily.  Marland Kitchen guaiFENesin-codeine 100-10  MG/5ML syrup Take 10 mLs by mouth every 4 (four) hours as needed for cough.  . hydrocortisone (ANUSOL-HC) 2.5 % rectal cream Place rectally 2 (two) times daily as needed for hemorrhoids or anal itching.  . lip balm (CARMEX) ointment Apply topically as needed for lip care.  . losartan (COZAAR) 50 MG tablet Take 50 mg by mouth daily.  . magic mouthwash SOLN Take 15 mLs by mouth 3 (three) times daily as needed for mouth pain.  . metFORMIN (GLUCOPHAGE) 850 MG tablet Take 850 mg by mouth 2 (two) times daily.  . mirtazapine (REMERON) 15 MG tablet Take 1 tablet (15 mg total) by mouth at bedtime.  Marland Kitchen omeprazole (PRILOSEC) 20 MG capsule Take 20 mg by mouth daily.  . polyethylene glycol (MIRALAX / GLYCOLAX) 17 g packet Take 17 g by mouth daily as needed for moderate constipation.  . potassium chloride (KLOR-CON) 10 MEQ tablet Take 1 tablet (10 mEq total) by mouth daily.  . predniSONE (DELTASONE) 5 MG tablet Take 1 tablet (5 mg total) by mouth daily with breakfast.  . promethazine-dextromethorphan (PROMETHAZINE-DM) 6.25-15 MG/5ML syrup Take 5 mLs by mouth 4 (four) times daily as needed.  Marland Kitchen QUEtiapine (SEROQUEL) 25 MG tablet Take 1 tablet (25 mg total) by mouth 2 (two) times daily.  Marland Kitchen senna-docusate (SENOKOT-S) 8.6-50 MG tablet Take 2 tablets by mouth 2 (two) times daily.  . traZODone (DESYREL) 50 MG tablet Take 1 tablet (50 mg total) by mouth at bedtime as needed for sleep.   No facility-administered encounter medications on file as of 07/01/2020.  Allergies as of 07/01/2020  . (No Known Allergies)    Past Medical History:  Diagnosis Date  . Depression   . Dyslipidemia     Past Surgical History:  Procedure Laterality Date  . COLONOSCOPY     1990's; hemorrhoids    Family History  Problem Relation Age of Onset  . Alcohol abuse Father   . Colon cancer Neg Hx     Social History   Socioeconomic History  . Marital status: Married    Spouse name: Not on file  . Number of children: Not on  file  . Years of education: Not on file  . Highest education level: Not on file  Occupational History  . Not on file  Tobacco Use  . Smoking status: Former Smoker    Packs/day: 1.00    Years: 8.00    Pack years: 8.00    Quit date: 03/24/1986    Years since quitting: 34.2  . Smokeless tobacco: Never Used  . Tobacco comment: quit 1988  Vaping Use  . Vaping Use: Never used  Substance and Sexual Activity  . Alcohol use: Yes    Comment: occ glass of wine  . Drug use: Never  . Sexual activity: Not on file  Other Topics Concern  . Not on file  Social History Narrative  . Not on file   Social Determinants of Health   Financial Resource Strain: Not on file  Food Insecurity: Not on file  Transportation Needs: Not on file  Physical Activity: Not on file  Stress: Not on file  Social Connections: Not on file  Intimate Partner Violence: Not At Risk  . Fear of Current or Ex-Partner: No  . Emotionally Abused: No  . Physically Abused: No  . Sexually Abused: No    Review of Systems  Constitutional: Positive for fatigue.  Respiratory: Positive for shortness of breath.   Cardiovascular: Negative for palpitations.  Psychiatric/Behavioral: Positive for sleep disturbance.    Vitals:   07/01/20 1148  BP: 124/84  Pulse: (!) 107  Temp: 97.6 F (36.4 C)  SpO2: 100%     Physical Exam Constitutional:      Appearance: Normal appearance.  Eyes:     General:        Right eye: No discharge.        Left eye: No discharge.     Pupils: Pupils are equal, round, and reactive to light.  Cardiovascular:     Rate and Rhythm: Normal rate and regular rhythm.     Pulses: Normal pulses.     Heart sounds: No murmur heard. No friction rub.  Pulmonary:     Effort: No respiratory distress.     Breath sounds: No stridor. No wheezing or rhonchi.  Musculoskeletal:     Cervical back: No rigidity or tenderness.  Neurological:     Mental Status: She is alert.  Psychiatric:        Mood and  Affect: Mood normal.    Data Reviewed: CT chest from February 12, 2020 reviewed by myself CT scan from 03/14/2020 reviewed by myself Echocardiogram 03/04/2020 with diastolic dysfunction  Assessment:  .  Hypercapnic respiratory failure -Continue oxygen supplementation -Trilogy ventilator use -Maintain oxygen supplementation to keep saturations above 90  .  Severe deconditioning -Graded exercise as tolerated  .  Pulmonary embolism -On anticoagulation -Repeat echo looks fine  Obstructive lung disease -To continue bronchodilator treatments  Plan/Recommendations: Continue long-term bronchodilator use Continue long-term ventilator use Graded exercise as tolerated Medications for  anxiety  Continue oxygen supplementation, encouraged to monitor oxygen regularly to wean herself off when she is not doing anything-May be on room air as long as maintaining saturations greater than 90%  I spent 32 minutes dedicated to the care of this patient on the date of this encounter to include previsit review of records, face-to-face time with the patient discussing conditions above, post visit ordering of testing, clinical documentation with electronic health record, making appropriate referrals as documented, and communicated necessary findings to members of the patient's care team   Virl Diamond, MD [facility] Pager: [phone]   CC: Avon Gully, MD

## 2020-07-29 ENCOUNTER — Telehealth: Payer: Self-pay | Admitting: Pulmonary Disease

## 2020-07-29 NOTE — Telephone Encounter (Signed)
ATC Patient to schedule OV for qualifying O2 re certification.

## 2020-07-31 NOTE — Telephone Encounter (Signed)
Pt has been scheduled for 7/8 for an OV to re-qualify. Will close encounter.

## 2020-08-15 ENCOUNTER — Encounter: Payer: Self-pay | Admitting: Primary Care

## 2020-08-15 ENCOUNTER — Other Ambulatory Visit: Payer: Self-pay

## 2020-08-15 ENCOUNTER — Ambulatory Visit (INDEPENDENT_AMBULATORY_CARE_PROVIDER_SITE_OTHER): Payer: 59 | Admitting: Primary Care

## 2020-08-15 VITALS — BP 112/74 | HR 108 | Temp 98.2°F | Ht 68.0 in | Wt 199.2 lb

## 2020-08-15 DIAGNOSIS — J9612 Chronic respiratory failure with hypercapnia: Secondary | ICD-10-CM | POA: Diagnosis not present

## 2020-08-15 DIAGNOSIS — J9611 Chronic respiratory failure with hypoxia: Secondary | ICD-10-CM | POA: Diagnosis not present

## 2020-08-15 DIAGNOSIS — I2699 Other pulmonary embolism without acute cor pulmonale: Secondary | ICD-10-CM | POA: Diagnosis not present

## 2020-08-15 NOTE — Progress Notes (Signed)
@Patient  ID: , female    DOB: 01-25-1961, 60 y.o.   MRN: [id number]  Chief Complaint  Patient presents with   Follow-up    Re qualify for 02.  Anxiety with breathing.  Coughing (dry cough) in am when coming off bipap.    Referring provider: [id number], MD  HPI: 60 year old female, former light smoker quit in 1988 (8 pack year hx).  Past medical history significant for hypertension, pulmonary embolism, COVID pneumonia, chronic respiratory failure with hypoxia.  Patient of Dr. 1989, last seen in office on 07/01/20. Patient remains on anticoagulation, oxygen and Trilogy ventilator at night. FU in 3 months (August-September 2022).   08/15/2020- Interim hx  Patient presents today to requalify for oxygen.  Patient was hospitalized with COVID-19 in December 2021 for approximately 6 weeks.  Patient had bilateral PEs and MRSA pneumonia at that time. She is no longer on anticoagulation, she was taken off after CT abdomen in February showed intramuscular hematoma. She is following with hematology.  She had a normal echocardiogram in May 2022. Clinically she continues to improve but remains on oxygen. She has not acute respiratory complaints today. She is currently on 2L oxygen with exertion and reports using 6L at night with Trilogy ventilator. DME company is [employer].  No Known Allergies   There is no immunization history on file for this patient.  Past Medical History:  Diagnosis Date   Depression    Dyslipidemia     Tobacco History: Social History   Tobacco Use  Smoking Status Former   Packs/day: 1.00   Years: 8.00   Pack years: 8.00   Types: Cigarettes   Quit date: 03/24/1986   Years since quitting: 34.4  Smokeless Tobacco Never  Tobacco Comments   quit 1988   Counseling given: Not Answered Tobacco comments: quit 1988   Outpatient Medications Prior to Visit  Medication Sig Dispense Refill   albuterol (PROVENTIL) (2.5 MG/3ML) 0.083% nebulizer solution Take 3  mLs (2.5 mg total) by nebulization every 4 (four) hours as needed for wheezing or shortness of breath. 75 mL 12   atorvastatin (LIPITOR) 10 MG tablet Take 10 mg by mouth daily.     benzonatate (TESSALON) 200 MG capsule Take 1 capsule by mouth three times daily as needed for cough 30 capsule 0   budesonide (PULMICORT) 0.5 MG/2ML nebulizer solution Take 2 mLs (0.5 mg total) by nebulization 2 (two) times daily. 2 mL 12   chlorhexidine (PERIDEX) 0.12 % solution 15 mLs by Mouth Rinse route 2 (two) times daily. 120 mL 0   D3-50 1.25 MG (50000 UT) capsule Take 50,000 Units by mouth every 30 (thirty) days.     gabapentin (NEURONTIN) 100 MG capsule Take 1 capsule (100 mg total) by mouth 2 (two) times daily. 60 capsule 1   hydrocortisone (ANUSOL-HC) 2.5 % rectal cream Place rectally 2 (two) times daily as needed for hemorrhoids or anal itching. 30 g 0   lip balm (CARMEX) ointment Apply topically as needed for lip care. 7 g 0   losartan (COZAAR) 50 MG tablet Take 50 mg by mouth daily.     magic mouthwash SOLN Take 15 mLs by mouth 3 (three) times daily as needed for mouth pain. 15 mL 0   metFORMIN (GLUCOPHAGE) 850 MG tablet Take 850 mg by mouth 2 (two) times daily.     mirtazapine (REMERON) 15 MG tablet Take 1 tablet (15 mg total) by mouth at bedtime. 30 tablet 2   omeprazole (PRILOSEC)  20 MG capsule Take 20 mg by mouth daily.     polyethylene glycol (MIRALAX / GLYCOLAX) 17 g packet Take 17 g by mouth daily as needed for moderate constipation. 14 each 0   potassium chloride (KLOR-CON) 10 MEQ tablet Take 1 tablet (10 mEq total) by mouth daily. 30 tablet 0   predniSONE (DELTASONE) 5 MG tablet Take 1 tablet (5 mg total) by mouth daily with breakfast. 2 tablet 0   promethazine-dextromethorphan (PROMETHAZINE-DM) 6.25-15 MG/5ML syrup Take 5 mLs by mouth 4 (four) times daily as needed.     QUEtiapine (SEROQUEL) 25 MG tablet Take 1 tablet (25 mg total) by mouth 2 (two) times daily. 60 tablet 2   senna-docusate  (SENOKOT-S) 8.6-50 MG tablet Take 2 tablets by mouth 2 (two) times daily. 30 tablet 0   traZODone (DESYREL) 50 MG tablet Take 1 tablet (50 mg total) by mouth at bedtime as needed for sleep. 30 tablet 2   apixaban (ELIQUIS) 5 MG TABS tablet Take 1 tablet (5 mg total) by mouth 2 (two) times daily. 60 tablet 0   guaiFENesin-codeine 100-10 MG/5ML syrup Take 10 mLs by mouth every 4 (four) hours as needed for cough. 120 mL 0   No facility-administered medications prior to visit.   Review of Systems  Review of Systems  Constitutional: Negative.   HENT: Negative.    Respiratory: Negative.      Physical Exam  BP 112/74 (BP Location: Left Arm, Patient Position: Sitting, Cuff Size: Large)   Pulse (!) 108   Temp 98.2 F (36.8 C) (Oral)   Ht 5\' 8"  (1.727 m)   Wt 199 lb 3.2 oz (90.4 kg)   SpO2 99%   BMI 30.29 kg/m  Physical Exam Constitutional:      Appearance: Normal appearance.  HENT:     Head: Normocephalic and atraumatic.     Mouth/Throat:     Mouth: Mucous membranes are moist.     Pharynx: Oropharynx is clear.  Cardiovascular:     Rate and Rhythm: Normal rate and regular rhythm.  Pulmonary:     Effort: Pulmonary effort is normal.     Breath sounds: Normal breath sounds.     Comments: CTA; 2L O2 Skin:    General: Skin is warm and dry.  Neurological:     General: No focal deficit present.     Mental Status: She is alert and oriented to person, place, and time. Mental status is at baseline.  Psychiatric:        Mood and Affect: Mood normal.        Behavior: Behavior normal.        Thought Content: Thought content normal.        Judgment: Judgment normal.     Lab Results:  CBC    Component Value Date/Time   WBC 6.3 06/10/2020 1316   RBC 3.93 06/10/2020 1316   HGB 11.2 (L) 06/10/2020 1316   HCT 36.0 06/10/2020 1316   PLT 244 06/10/2020 1316   MCV 91.6 06/10/2020 1316   MCH 28.5 06/10/2020 1316   MCHC 31.1 06/10/2020 1316   RDW 13.8 06/10/2020 1316   LYMPHSABS 1.2  06/10/2020 1316   MONOABS 0.5 06/10/2020 1316   EOSABS 0.1 06/10/2020 1316   BASOSABS 0.0 06/10/2020 1316    BMET    Component Value Date/Time   NA 140 06/10/2020 1525   K 3.7 06/10/2020 1525   CL 103 06/10/2020 1525   CO2 31 06/10/2020 1525   GLUCOSE 113 (  H) 06/10/2020 1525   BUN 6 06/10/2020 1525   CREATININE 0.48 06/10/2020 1525   CALCIUM 8.9 06/10/2020 1525   GFRNONAA >60 06/10/2020 1525    BNP    Component Value Date/Time   BNP 50.5 03/13/2020 0434    ProBNP No results found for: PROBNP  Imaging: No results found.   Assessment & Plan:   Chronic respiratory failure with hypoxia and hypercapnia (HCC) - Patient re-qualified for supplemental oxygen today. She was able to maintain her O2 level >88% after 3 laps but desaturated while speaking to 87 RA%. She continues to benefit from supplemental oxygen. Placed order for POC. I will discuss checking ONO on Triology with Dr. Wynona Neat to help assess her oxygen needs at night.   Pulmonary emboli (HCC) - She came off anticoagulation around Feb 2022 d/t anemia and intramuscular hematoma. Repeat D-dimer was negative on 06/10/2020 and CT chest from 06/11/2020 which did not show any evidence of pulmonary embolism. It was not recommended that she resume anticoagulation with Eliguis, we have taken this off her medication list today.    Glenford Bayley, NP 08/15/2020

## 2020-08-15 NOTE — Assessment & Plan Note (Signed)
-   Patient re-qualified for supplemental oxygen today. She was able to maintain her O2 level >88% after 3 laps but desaturated while speaking to 87 RA%. She continues to benefit from supplemental oxygen. Placed order for POC. I will discuss checking ONO on Triology with Dr. Wynona Neat to help assess her oxygen needs at night.

## 2020-08-15 NOTE — Patient Instructions (Addendum)
Recommendations: - Continue supplemental oxygen 2L continuous, start to wean as tolerated to keep maintain >88-90% - Continue Trilogy ventilator at night  Orders: - Renew oxygen with [employer] and place order for POC   Follow-up: - August-September with Dr. Wynona Neat

## 2020-08-15 NOTE — Assessment & Plan Note (Addendum)
-   She came off anticoagulation around Feb 2022 d/t anemia and intramuscular hematoma. Repeat D-dimer was negative on 06/10/2020 and CT chest from 06/11/2020 which did not show any evidence of pulmonary embolism. It was not recommended that she resume anticoagulation with Eliguis, we have taken this off her medication list today.

## 2020-09-10 ENCOUNTER — Telehealth: Payer: Self-pay | Admitting: Primary Care

## 2020-09-10 DIAGNOSIS — J9611 Chronic respiratory failure with hypoxia: Secondary | ICD-10-CM

## 2020-09-10 DIAGNOSIS — J9612 Chronic respiratory failure with hypercapnia: Secondary | ICD-10-CM

## 2020-09-10 NOTE — Telephone Encounter (Signed)
Patient need ONO while wearing Trilogy at night on room air. If she needs oxygen we will determine from that how many liters she needs to be wearing at night. Order has been placed

## 2020-09-18 ENCOUNTER — Other Ambulatory Visit: Payer: Self-pay

## 2020-09-18 ENCOUNTER — Ambulatory Visit (HOSPITAL_COMMUNITY): Payer: 59 | Admitting: Hematology

## 2020-09-18 ENCOUNTER — Inpatient Hospital Stay (HOSPITAL_COMMUNITY): Payer: 59 | Attending: Hematology

## 2020-09-18 ENCOUNTER — Inpatient Hospital Stay (HOSPITAL_BASED_OUTPATIENT_CLINIC_OR_DEPARTMENT_OTHER): Payer: 59 | Admitting: Nurse Practitioner

## 2020-09-18 ENCOUNTER — Other Ambulatory Visit (HOSPITAL_COMMUNITY): Payer: 59

## 2020-09-18 VITALS — BP 135/84 | HR 96 | Temp 97.8°F | Resp 18 | Wt 203.2 lb

## 2020-09-18 DIAGNOSIS — I2694 Multiple subsegmental pulmonary emboli without acute cor pulmonale: Secondary | ICD-10-CM | POA: Diagnosis not present

## 2020-09-18 DIAGNOSIS — Z8719 Personal history of other diseases of the digestive system: Secondary | ICD-10-CM | POA: Diagnosis not present

## 2020-09-18 DIAGNOSIS — R5383 Other fatigue: Secondary | ICD-10-CM | POA: Insufficient documentation

## 2020-09-18 DIAGNOSIS — Z9981 Dependence on supplemental oxygen: Secondary | ICD-10-CM | POA: Diagnosis not present

## 2020-09-18 DIAGNOSIS — Z7901 Long term (current) use of anticoagulants: Secondary | ICD-10-CM | POA: Diagnosis not present

## 2020-09-18 DIAGNOSIS — Z8616 Personal history of COVID-19: Secondary | ICD-10-CM | POA: Insufficient documentation

## 2020-09-18 DIAGNOSIS — R0602 Shortness of breath: Secondary | ICD-10-CM | POA: Insufficient documentation

## 2020-09-18 DIAGNOSIS — Z79899 Other long term (current) drug therapy: Secondary | ICD-10-CM | POA: Diagnosis not present

## 2020-09-18 DIAGNOSIS — I2699 Other pulmonary embolism without acute cor pulmonale: Secondary | ICD-10-CM | POA: Diagnosis not present

## 2020-09-18 DIAGNOSIS — Z86711 Personal history of pulmonary embolism: Secondary | ICD-10-CM | POA: Insufficient documentation

## 2020-09-18 LAB — D-DIMER, QUANTITATIVE: D-Dimer, Quant: 0.3 ug/mL-FEU (ref 0.00–0.50)

## 2020-09-18 NOTE — Patient Instructions (Addendum)
[facility] at [facility] Discharge Instructions  You were seen today by Consuello Masse, NP.  She discussed your recent lab work and your D-dimer is negative.  She recommends follow up in 6 months and we will repeat your D-dimer lab at that time.  Please follow up as scheduled.   Thank you for choosing [facility] at [facility] to provide your oncology and hematology care.  To afford each patient quality time with our provider, please arrive at least 15 minutes before your scheduled appointment time.   If you have a lab appointment with the Cancer Center please come in thru the Main Entrance and check in at the main information desk.  You need to re-schedule your appointment should you arrive 10 or more minutes late.  We strive to give you quality time with our providers, and arriving late affects you and other patients whose appointments are after yours.  Also, if you no show three or more times for appointments you may be dismissed from the clinic at the providers discretion.     Again, thank you for choosing [facility].  Our hope is that these requests will decrease the amount of time that you wait before being seen by our physicians.       _____________________________________________________________  Should you have questions after your visit to [facility], please contact our office at [phone] and follow the prompts.  Our office hours are 8:00 a.m. and 4:30 p.m. Monday - Friday.  Please note that voicemails left after 4:00 p.m. may not be returned until the following business day.  We are closed weekends and major holidays.  You do have access to a nurse 24-7, just call the main number to the clinic [phone] and do not press any options, hold on the line and a nurse will answer the phone.    For prescription refill requests, have your pharmacy contact our office and allow 72 hours.    Due to Covid, you will  need to wear a mask upon entering the hospital. If you do not have a mask, a mask will be given to you at the Main Entrance upon arrival. For doctor visits, patients may have 1 support person age 57 or older with them. For treatment visits, patients can not have anyone with them due to social distancing guidelines and our immunocompromised population.

## 2020-09-18 NOTE — Progress Notes (Signed)
[facility] [address] [place], [place] [address]   Virtual Visit Progress Note  I connected with Christina NajjarRetha D Vincent on 09/18/20 at  1:20 PM EDT by video enabled telemedicine visit and verified that I am speaking with the correct person using two identifiers.   I discussed the limitations, risks, security and privacy concerns of performing an evaluation and management service by telemedicine and the availability of in-person appointments. I also discussed with the patient that there may be a patient responsible charge related to this service. The patient expressed understanding and agreed to proceed.   Other persons participating in the visit and their role in the encounter: RN, NP, Patient   Patient's location: [facility]  Provider's location: [facility]   CLINIC:  Medical Oncology/Hematology  PCP:  Avon GullyFanta, Tesfaye, MD [address] / [place] [id number]  [phone]  REASON FOR VISIT:  Follow-up for bilateral PE post-COVID  PRIOR THERAPY: Lovenox  CURRENT THERAPY: Eliquis 5 mg BID  History of presenting illness: She was evaluated in our office for bilateral pulmonary embolism originally diagnosed on 02/12/2020.  This happened in the setting of COVID infection. She was oxygen dependent post covid. She was treated with Lovenox which had to be stopped because of intramuscular hematoma on 03/14/2020. D-dimer which was negative on 06/10/2020. CT chest from 06/11/2020 which did not show any evidence of pulmonary embolism.  However it showed bilateral areas of subpleural postinflammatory scarring. She did not have to go back on anticoagulation.    INTERVAL HISTORY:  Ms. Christina NajjarRetha D Vincent, a 60 y.o. female, returns for labs and routine follow up for above history of provoked dvt post covid infection. She continues supplemental oxygen. She is no longer using a wheelchair. Denies any neurologic complaints. Denies recent fevers or illnesses. Denies any easy bleeding or bruising. No  melena or hematochezia. Reports good appetite and denies weight loss. Denies chest pain. Denies any nausea, vomiting, constipation, or diarrhea. Denies urinary complaints. Patient offers no further specific complaints today.   REVIEW OF SYSTEMS:  Review of Systems  Constitutional:  Positive for fatigue. Negative for appetite change and unexpected weight change.  HENT:   Negative for mouth sores, sore throat and trouble swallowing.   Respiratory:  Positive for shortness of breath. Negative for chest tightness.   Cardiovascular:  Negative for chest pain, leg swelling and palpitations.  Gastrointestinal:  Negative for abdominal pain, constipation, diarrhea, nausea and vomiting.  Genitourinary:  Negative for bladder incontinence and dysuria.   Musculoskeletal:  Negative for flank pain and neck stiffness.  Skin:  Negative for itching, rash and wound.  Neurological:  Negative for dizziness, headaches, light-headedness and numbness.  Hematological:  Negative for adenopathy. Does not bruise/bleed easily.  Psychiatric/Behavioral:  Negative for confusion, depression and sleep disturbance. The patient is nervous/anxious.   All other systems reviewed and are negative.  PAST MEDICAL/SURGICAL HISTORY:  Past Medical History:  Diagnosis Date   Depression    Dyslipidemia    Past Surgical History:  Procedure Laterality Date   COLONOSCOPY     1990's; hemorrhoids    SOCIAL HISTORY:  Social History   Socioeconomic History   Marital status: Married    Spouse name: Not on file   Number of children: Not on file   Years of education: Not on file   Highest education level: Not on file  Occupational History   Not on file  Tobacco Use   Smoking status: Former    Packs/day:  1.00    Years: 8.00    Pack years: 8.00    Types: Cigarettes    Quit date: 03/24/1986    Years since quitting: 34.5   Smokeless tobacco: Never   Tobacco comments:    quit 1988  Vaping Use   Vaping Use: Never used   Substance and Sexual Activity   Alcohol use: Yes    Comment: occ glass of wine   Drug use: Never   Sexual activity: Not on file  Other Topics Concern   Not on file  Social History Narrative   Not on file   Social Determinants of Health   Financial Resource Strain: Not on file  Food Insecurity: Not on file  Transportation Needs: Not on file  Physical Activity: Not on file  Stress: Not on file  Social Connections: Not on file  Intimate Partner Violence: Not At Risk   Fear of Current or Ex-Partner: No   Emotionally Abused: No   Physically Abused: No   Sexually Abused: No    FAMILY HISTORY:  Family History  Problem Relation Age of Onset   Alcohol abuse Father    Colon cancer Neg Hx     CURRENT MEDICATIONS:  Current Outpatient Medications  Medication Sig Dispense Refill   atorvastatin (LIPITOR) 10 MG tablet Take 10 mg by mouth daily.     budesonide (PULMICORT) 0.5 MG/2ML nebulizer solution Take 2 mLs (0.5 mg total) by nebulization 2 (two) times daily. 2 mL 12   chlorhexidine (PERIDEX) 0.12 % solution 15 mLs by Mouth Rinse route 2 (two) times daily. 120 mL 0   D3-50 1.25 MG (50000 UT) capsule Take 50,000 Units by mouth every 30 (thirty) days.     gabapentin (NEURONTIN) 100 MG capsule Take 1 capsule (100 mg total) by mouth 2 (two) times daily. 60 capsule 1   hydrocortisone (ANUSOL-HC) 2.5 % rectal cream Place rectally 2 (two) times daily as needed for hemorrhoids or anal itching. 30 g 0   lip balm (CARMEX) ointment Apply topically as needed for lip care. 7 g 0   losartan (COZAAR) 50 MG tablet Take 50 mg by mouth daily.     magic mouthwash SOLN Take 15 mLs by mouth 3 (three) times daily as needed for mouth pain. 15 mL 0   metFORMIN (GLUCOPHAGE) 850 MG tablet Take 850 mg by mouth 2 (two) times daily.     mirtazapine (REMERON) 15 MG tablet Take 1 tablet (15 mg total) by mouth at bedtime. 30 tablet 2   omeprazole (PRILOSEC) 20 MG capsule Take 20 mg by mouth daily.      polyethylene glycol (MIRALAX / GLYCOLAX) 17 g packet Take 17 g by mouth daily as needed for moderate constipation. 14 each 0   potassium chloride (KLOR-CON) 10 MEQ tablet Take 1 tablet (10 mEq total) by mouth daily. 30 tablet 0   predniSONE (DELTASONE) 5 MG tablet Take 1 tablet (5 mg total) by mouth daily with breakfast. 2 tablet 0   promethazine-dextromethorphan (PROMETHAZINE-DM) 6.25-15 MG/5ML syrup Take 5 mLs by mouth 4 (four) times daily as needed.     QUEtiapine (SEROQUEL) 25 MG tablet Take 1 tablet (25 mg total) by mouth 2 (two) times daily. 60 tablet 2   senna-docusate (SENOKOT-S) 8.6-50 MG tablet Take 2 tablets by mouth 2 (two) times daily. 30 tablet 0   traZODone (DESYREL) 50 MG tablet Take 1 tablet (50 mg total) by mouth at bedtime as needed for sleep. 30 tablet 2   albuterol (PROVENTIL) (  2.5 MG/3ML) 0.083% nebulizer solution Take 3 mLs (2.5 mg total) by nebulization every 4 (four) hours as needed for wheezing or shortness of breath. (Patient not taking: Reported on 09/18/2020) 75 mL 12   benzonatate (TESSALON) 200 MG capsule Take 1 capsule by mouth three times daily as needed for cough (Patient not taking: Reported on 09/18/2020) 30 capsule 0   No current facility-administered medications for this visit.    ALLERGIES:  No Known Allergies  PHYSICAL EXAM:  Performance status (ECOG): 2 - Symptomatic, <50% confined to bed  Vitals:   09/18/20 1324  BP: 135/84  Pulse: 96  Resp: 18  Temp: 97.8 F (36.6 C)  SpO2: 100%   Wt Readings from Last 3 Encounters:  09/18/20 203 lb 3.2 oz (92.2 kg)  08/15/20 199 lb 3.2 oz (90.4 kg)  07/01/20 191 lb (86.6 kg)   Physical Exam Constitutional:      General: She is not in acute distress. HENT:     Head: Normocephalic.  Pulmonary:     Effort: No respiratory distress.  Neurological:     Mental Status: She is alert and oriented to person, place, and time.  Psychiatric:        Mood and Affect: Mood normal.        Behavior: Behavior normal.     LABORATORY DATA:  I have reviewed the labs as listed.  CBC Latest Ref Rng & Units 06/10/2020 04/18/2020 03/24/2020  WBC 4.0 - 10.5 K/uL 6.3 12.0(H) 12.8(H)  Hemoglobin 12.0 - 15.0 g/dL 11.2(L) 11.9(L) 10.7(L)  Hematocrit 36.0 - 46.0 % 36.0 40.0 34.2(L)  Platelets 150 - 400 K/uL 244 441(H) 401.0(H)   CMP Latest Ref Rng & Units 06/10/2020 04/07/2020 03/24/2020  Glucose 70 - 99 mg/dL 703(J) 009(F) 818(E)  BUN 6 - 20 mg/dL 6 3(L) 4(L)  Creatinine 0.44 - 1.00 mg/dL 9.93 7.16(R) 6.78(L)  Sodium 135 - 145 mmol/L 140 141 139  Potassium 3.5 - 5.1 mmol/L 3.7 3.5 3.3(L)  Chloride 98 - 111 mmol/L 103 98 98  CO2 22 - 32 mmol/L 31 36(H) 35(H)  Calcium 8.9 - 10.3 mg/dL 8.9 8.7 9.2  Total Protein 6.5 - 8.1 g/dL 7.3 - -  Total Bilirubin 0.3 - 1.2 mg/dL 0.5 - -  Alkaline Phos 38 - 126 U/L 64 - -  AST 15 - 41 U/L 35 - -  ALT 0 - 44 U/L 29 - -      Component Value Date/Time   RBC 3.93 06/10/2020 1316   MCV 91.6 06/10/2020 1316   MCH 28.5 06/10/2020 1316   MCHC 31.1 06/10/2020 1316   RDW 13.8 06/10/2020 1316   LYMPHSABS 1.2 06/10/2020 1316   MONOABS 0.5 06/10/2020 1316   EOSABS 0.1 06/10/2020 1316   BASOSABS 0.0 06/10/2020 1316    DIAGNOSTIC IMAGING:  I have independently reviewed the scans and discussed with the patient. No results found.   ASSESSMENT & PLAN:  1.  Bilateral pulmonary emboli provoked by COVID-19: - CT angiogram on 02/12/2020 with acute bilateral lower lobe and right middle lobe segmental pulmonary emboli with no evidence of right heart strain.  Multifocal pneumonia was seen. - Ultrasound on 02/12/2020 was negative for DVT. - She was hospitalized for 45 days at that time and received Lovenox as anticoagulation. - CT on 03/14/2020 of the abdomen showed left obturator internus intramuscular hematoma. - Anticoagulation was discontinued because of the hematoma. - D dimer is again negative.  - Emboli had resolved on previous imaging - She is  off anticoagulation and is high risk of  bleeding given her previous intramuscular hematoma.  - D dimer today is negative - she is continuing to recover from covid infection and doing well - no anticoagulation at this time.  - rtc in 6 months for d dimer and follow up with Dr. Kirtland Bouchard. May consider releasing her from clinic in the future.    Orders placed this encounter:  No orders of the defined types were placed in this encounter.   I discussed the assessment and treatment plan with the patient. The patient was provided an opportunity to ask questions and all were answered. The patient agreed with the plan and demonstrated an understanding of the instructions.   The patient was advised to call back or seek an in-person evaluation if the symptoms worsen or if the condition fails to improve as anticipated.   I spent 15 minutes face-to-face video visit time dedicated to the care of this patient on the date of this encounter to include pre-visit review of medical oncology notes, labs, face-to-face time with the patient, and post visit ordering of testing/documentation.   Consuello Masse, DNP, AGNP-C [facility] [phone]

## 2020-09-24 ENCOUNTER — Telehealth: Payer: Self-pay | Admitting: Primary Care

## 2020-09-24 DIAGNOSIS — J1282 Pneumonia due to coronavirus disease 2019: Secondary | ICD-10-CM

## 2020-09-24 DIAGNOSIS — U071 COVID-19: Secondary | ICD-10-CM

## 2020-09-24 DIAGNOSIS — J9612 Chronic respiratory failure with hypercapnia: Secondary | ICD-10-CM

## 2020-09-24 DIAGNOSIS — J9611 Chronic respiratory failure with hypoxia: Secondary | ICD-10-CM

## 2020-09-24 NOTE — Telephone Encounter (Signed)
ONO 09/16/20 showed that patient spent 15 mins with SpO2 <88%. SpO2 low 75%, basal 95%  Can you please confirm whether or not Christina Vincent was done on Trilogy or not. If she was wearing Trilogy during ONO test please ask Dr. Wynona Neat if we can just order her nocturnal oxygen or if she would need in-lab study. If she did not wear Triology during test than nothing is needed at this time, just reinforce importance of nightly use

## 2020-09-25 NOTE — Telephone Encounter (Signed)
Called pt and spoke with daughter as well as pt letting them know the results of pt's ONO results and asked if the ONO was performed with pt wearing the trilogy vent.  ONO was done using the trilogy vent with 3.5L O2 as well.  Dr. Val Eagle, based on this info, please advise what you recommend [place] to do for pt. Pt does have a f/u with you 8/31.

## 2020-10-01 ENCOUNTER — Other Ambulatory Visit: Payer: Self-pay | Admitting: Internal Medicine

## 2020-10-01 DIAGNOSIS — Z1231 Encounter for screening mammogram for malignant neoplasm of breast: Secondary | ICD-10-CM

## 2020-10-08 ENCOUNTER — Ambulatory Visit (INDEPENDENT_AMBULATORY_CARE_PROVIDER_SITE_OTHER): Payer: 59 | Admitting: Pulmonary Disease

## 2020-10-08 ENCOUNTER — Encounter: Payer: Self-pay | Admitting: Pulmonary Disease

## 2020-10-08 ENCOUNTER — Other Ambulatory Visit: Payer: Self-pay

## 2020-10-08 VITALS — BP 132/86 | HR 102 | Temp 98.3°F | Ht 68.0 in | Wt 204.8 lb

## 2020-10-08 DIAGNOSIS — J9611 Chronic respiratory failure with hypoxia: Secondary | ICD-10-CM | POA: Diagnosis not present

## 2020-10-08 DIAGNOSIS — J9612 Chronic respiratory failure with hypercapnia: Secondary | ICD-10-CM | POA: Diagnosis not present

## 2020-10-08 NOTE — Patient Instructions (Signed)
We will check your oxygen with walking  Continue oxygen use at night with the machine  Exercise as tolerated  I will see you in 3 months

## 2020-10-08 NOTE — Progress Notes (Signed)
Christina Vincent    [id number]    1960/02/14  Primary Care Physician:Fanta, Ninetta Lights, MD  Referring Physician: Avon Gully, MD [address] [place],  [place] [address]  Chief complaint:   In for follow-up visit today Doing well since last visit  HPI:  60 year old lady with history of diabetes, obesity, hypertension, hyperlipidemia Recently hospitalized for COVID-19 in December 2021, about 6 weeks hospitalization Did have bilateral PEs, severe anemia, MRSA pneumonia, severe anxiety post Covid  Continues to stay active Tolerating physical activity Still on oxygen supplementation  Able to exercise Able to tolerate some chores  On a trilogy vent at night -ABG while in the hospital showed hypercapnic respiratory failure  She is continuing to improve  Medications for anxiety  Download from a trilogy ventilator shows good compliance  Outpatient Encounter Medications as of 10/08/2020  Medication Sig   atorvastatin (LIPITOR) 10 MG tablet Take 10 mg by mouth daily.   benzonatate (TESSALON) 200 MG capsule Take 1 capsule by mouth three times daily as needed for cough   budesonide (PULMICORT) 0.5 MG/2ML nebulizer solution Take 2 mLs (0.5 mg total) by nebulization 2 (two) times daily.   chlorhexidine (PERIDEX) 0.12 % solution 15 mLs by Mouth Rinse route 2 (two) times daily.   D3-50 1.25 MG (50000 UT) capsule Take 50,000 Units by mouth every 30 (thirty) days.   gabapentin (NEURONTIN) 100 MG capsule Take 1 capsule (100 mg total) by mouth 2 (two) times daily.   hydrocortisone (ANUSOL-HC) 2.5 % rectal cream Place rectally 2 (two) times daily as needed for hemorrhoids or anal itching.   lip balm (CARMEX) ointment Apply topically as needed for lip care.   losartan (COZAAR) 50 MG tablet Take 50 mg by mouth daily.   magic mouthwash SOLN Take 15 mLs by mouth 3 (three) times daily as needed for mouth pain.   metFORMIN (GLUCOPHAGE) 850 MG tablet Take 850 mg by mouth 2 (two)  times daily.   mirtazapine (REMERON) 15 MG tablet Take 1 tablet (15 mg total) by mouth at bedtime.   omeprazole (PRILOSEC) 20 MG capsule Take 20 mg by mouth daily.   polyethylene glycol (MIRALAX / GLYCOLAX) 17 g packet Take 17 g by mouth daily as needed for moderate constipation.   potassium chloride (KLOR-CON) 10 MEQ tablet Take 1 tablet (10 mEq total) by mouth daily.   promethazine-dextromethorphan (PROMETHAZINE-DM) 6.25-15 MG/5ML syrup Take 5 mLs by mouth 4 (four) times daily as needed.   QUEtiapine (SEROQUEL) 25 MG tablet Take 1 tablet (25 mg total) by mouth 2 (two) times daily.   senna-docusate (SENOKOT-S) 8.6-50 MG tablet Take 2 tablets by mouth 2 (two) times daily.   traZODone (DESYREL) 50 MG tablet Take 1 tablet (50 mg total) by mouth at bedtime as needed for sleep.   albuterol (PROVENTIL) (2.5 MG/3ML) 0.083% nebulizer solution Take 3 mLs (2.5 mg total) by nebulization every 4 (four) hours as needed for wheezing or shortness of breath. (Patient not taking: Reported on 10/08/2020)   [DISCONTINUED] predniSONE (DELTASONE) 5 MG tablet Take 1 tablet (5 mg total) by mouth daily with breakfast.   No facility-administered encounter medications on file as of 10/08/2020.    Allergies as of 10/08/2020   (No Known Allergies)    Past Medical History:  Diagnosis Date   Depression    Dyslipidemia     Past Surgical History:  Procedure Laterality Date   COLONOSCOPY     1990's; hemorrhoids  Family History  Problem Relation Age of Onset   Alcohol abuse Father    Colon cancer Neg Hx     Social History   Socioeconomic History   Marital status: Married    Spouse name: Not on file   Number of children: Not on file   Years of education: Not on file   Highest education level: Not on file  Occupational History   Not on file  Tobacco Use   Smoking status: Former    Packs/day: 1.00    Years: 8.00    Pack years: 8.00    Types: Cigarettes    Quit date: 03/24/1986    Years since  quitting: 34.5   Smokeless tobacco: Never   Tobacco comments:    quit 1988  Vaping Use   Vaping Use: Never used  Substance and Sexual Activity   Alcohol use: Yes    Comment: occ glass of wine   Drug use: Never   Sexual activity: Not on file  Other Topics Concern   Not on file  Social History Narrative   Not on file   Social Determinants of Health   Financial Resource Strain: Not on file  Food Insecurity: Not on file  Transportation Needs: Not on file  Physical Activity: Not on file  Stress: Not on file  Social Connections: Not on file  Intimate Partner Violence: Not At Risk   Fear of Current or Ex-Partner: No   Emotionally Abused: No   Physically Abused: No   Sexually Abused: No    Review of Systems  Constitutional:  Positive for fatigue.  Respiratory:  Positive for shortness of breath.   Cardiovascular:  Negative for palpitations.  Psychiatric/Behavioral:  Positive for sleep disturbance.    Vitals:   10/08/20 1602  BP: 132/86  Pulse: (!) 102  Temp: 98.3 F (36.8 C)  SpO2: 99%     Physical Exam Constitutional:      Appearance: Normal appearance.  Eyes:     General:        Right eye: No discharge.        Left eye: No discharge.     Pupils: Pupils are equal, round, and reactive to light.  Cardiovascular:     Rate and Rhythm: Normal rate and regular rhythm.     Pulses: Normal pulses.     Heart sounds: No murmur heard.   No friction rub.  Pulmonary:     Effort: No respiratory distress.     Breath sounds: No stridor. No wheezing or rhonchi.  Musculoskeletal:     Cervical back: No rigidity or tenderness.  Neurological:     Mental Status: She is alert.  Psychiatric:        Mood and Affect: Mood normal.   Data Reviewed: CT chest from February 12, 2020 reviewed by myself CT scan from 03/14/2020 reviewed by myself Echocardiogram 03/04/2020 with diastolic dysfunction  Ambulated today and all weight and does qualify for oxygen supplementation at 1  L  Overnight oximetry was performed on supplemental oxygen and vent-minimal desaturations Assessment:  .  Hypercapnic respiratory failure -Continue oxygen segmentation Trilogy ventilator use at night Continue oxygen use to maintain saturations around 90%  .  Severe deconditioning -This is improving -Continue graded exercises as tolerated  .  Pulmonary embolism -On anticoagulation  Obstructive lung disease -To continue bronchodilator treatments  Plan/Recommendations:  Continue bronchodilators  Continue trilogy ventilator use at night  Graded exercise as tolerated  Continue current medications  Prescription for portable concentrator  Virl Diamond, MD  PCCM Pager: [phone]   CC: Avon Gully, MD

## 2020-10-28 NOTE — Telephone Encounter (Signed)
Increase oxygen supplementation to 4.5 L and repeat ONO  Order should clearly state that it should be done with trilogy in place with oxygen supplementation at 4.5 L

## 2020-10-28 NOTE — Telephone Encounter (Signed)
I called and spoke with patient regarding Dr. Agustina Caroli. Patient verbalized understanding and I sent in order for another ono on trilogy vent with O2 supplementation at 4.5L per Dr. Val Eagle requests. Nothing further needed.

## 2020-11-10 ENCOUNTER — Telehealth: Payer: Self-pay | Admitting: Pulmonary Disease

## 2020-11-10 NOTE — Telephone Encounter (Signed)
Called and spoke with [employer], [place].  Patient walk and signed CMN needs to be faxed to [facility], because insurance authorization is due.   Advised updated walk was 10/08/20 at OV, with Dr. Wynona Neat. CMN is being faxed for provider signature.  Message routed to [facility]

## 2020-11-13 NOTE — Telephone Encounter (Signed)
Community message sent to Duwayne Heck and Elease Hashimoto with [employer] to request CMN form.

## 2020-11-13 NOTE — Telephone Encounter (Signed)
I have not received anything from [employer] on this patient

## 2020-11-27 ENCOUNTER — Telehealth: Payer: Self-pay | Admitting: Pulmonary Disease

## 2020-11-27 NOTE — Telephone Encounter (Signed)
Pt very upset.Pt states they have called [employer], [facility] states they don't have any paperwork or orders on patient. Pt states spouse is going to come up to get papers(orders for cpap-has had HST, 6 minute walk, order for POC) Everything looks like it has been sent to [facility]. Please advise [phone] spouse- [phone]

## 2020-11-28 NOTE — Telephone Encounter (Signed)
Spoke with Christina Vincent She states that pt does not qualify for POC with them bc she has had o2 with [facility] since Feb 2022 She suggested we order best fit eval for this pt and they can go over portable options for her  She thinks ML6  may be a good option since same size and weight as a POC  Called pt to discuss and there was no answer and no option to leave msg, will call back

## 2020-11-28 NOTE — Telephone Encounter (Signed)
I have sent a community message to Duwayne Heck to check on the status of her POC.

## 2020-12-05 ENCOUNTER — Telehealth: Payer: Self-pay | Admitting: Pulmonary Disease

## 2020-12-05 NOTE — Telephone Encounter (Signed)
Spoke with pt who states [facility] deliviered ONO machine to pt on Thursday night but pt is not sure why. I did not see where new order for ONO was placed as far as I can see. Will contact [facility] on Monday as they now are closed.

## 2020-12-05 NOTE — Telephone Encounter (Signed)
Pt states she was told by [employer] that she needed to do another HST. Pt states [facility] dropped off ONO machine-pt had discrepencies with the machine and [facility] and now states she has discrepencies with [place] because this is a stressful mess and doesn't know who to talk to. Please advise [phone]

## 2020-12-09 ENCOUNTER — Telehealth: Payer: Self-pay | Admitting: Pulmonary Disease

## 2020-12-09 ENCOUNTER — Other Ambulatory Visit: Payer: Self-pay

## 2020-12-09 ENCOUNTER — Ambulatory Visit
Admission: RE | Admit: 2020-12-09 | Discharge: 2020-12-09 | Disposition: A | Discharge status: Home / Self Care | Payer: 59 | Source: Ambulatory Visit | Attending: Internal Medicine | Admitting: Internal Medicine

## 2020-12-09 DIAGNOSIS — Z1231 Encounter for screening mammogram for malignant neoplasm of breast: Secondary | ICD-10-CM

## 2020-12-09 NOTE — Telephone Encounter (Signed)
Oximetry results noted  Recording appears to be from 12 noon to 7 PM  Will scan to record

## 2020-12-09 NOTE — Telephone Encounter (Signed)
Called and spoke with patient to see what is going on with [employer]. She said she is not sure what is going on. They dropped off ONO machine on Thursday and she states they dropped it off back at office on Friday. She states she got a call yesterday from [employer] saying they needed authorization from [place] but not sure for what. Advised her I was going to call [employer] to try and figure everything out and would touch base with her.  ATC Melissa with [employer] to see what is going on with this patient and what it is that they are needing from the patient and from [place], [facility]

## 2020-12-09 NOTE — Telephone Encounter (Signed)
Sent community msg to [employer]

## 2020-12-10 NOTE — Telephone Encounter (Signed)
Found community message in Leslie's inbox:   "Hello Verlon Au,   It looks like the ONO test has been saved to the account. We can provide you a copy if you need it.   As for needed info. I am not sure what is needed,  I will defer to [facility] on that.  The notes say we need an updated blood gas study and CN.   Duwayne Heck can you look into this?   Thank you,   Brad New "  Will await Danielle's response.

## 2020-12-23 ENCOUNTER — Telehealth: Payer: Self-pay | Admitting: Pulmonary Disease

## 2020-12-23 NOTE — Telephone Encounter (Signed)
I called the patient and let her know about her results and she had to use her oxygen during the test. She did have to get on the concentrator.   She is not sure why the test was ordered by [employer] and it was not ordered by the office. She is going to call [employer] about her repeat sleep study and she is getting the run around about her concentrator.   She is trying to get this fixed and she is in contact with [employer]. She is in a battle to get this fixed.

## 2020-12-23 NOTE — Telephone Encounter (Signed)
Oximetry reviewed showing adequate oxygen supplementation with no significant desaturation,  study was performed between 11:30 AM and 6 PM  Assumption is that patient uses the trilogy ventilator during his hours  -No other changes need made

## 2020-12-30 NOTE — Telephone Encounter (Signed)
Open encounter from [facility] on 11/1 about ONO. Closing this encounter.

## 2020-12-30 NOTE — Telephone Encounter (Signed)
Called and spoke with pt letting her know the info from [employer]. Pt said at this time she is putting it on hold to have order for best fit eval at this time and stated that she plans to switch to a different DME at beginning of the year. Nothing further needed.

## 2021-01-14 ENCOUNTER — Telehealth: Payer: Self-pay | Admitting: Pulmonary Disease

## 2021-01-16 NOTE — Telephone Encounter (Signed)
Pt's OV notes and walk tests have been printed to be faxed to the provided fax number by RJ with [facility]. Nothing further needed.

## 2021-03-13 ENCOUNTER — Telehealth: Payer: Self-pay | Admitting: Pulmonary Disease

## 2021-03-13 DIAGNOSIS — J9612 Chronic respiratory failure with hypercapnia: Secondary | ICD-10-CM

## 2021-03-13 DIAGNOSIS — J9611 Chronic respiratory failure with hypoxia: Secondary | ICD-10-CM

## 2021-03-13 NOTE — Telephone Encounter (Signed)
Called and spoke with patient about switching DME companies. Placed order to switch from [facility] to [facility]. Nothing further needed

## 2021-03-17 ENCOUNTER — Other Ambulatory Visit: Payer: Self-pay

## 2021-03-17 DIAGNOSIS — R0602 Shortness of breath: Secondary | ICD-10-CM

## 2021-03-17 NOTE — Telephone Encounter (Signed)
Order was placed to switch DME company from [facility] to [facility] but order does not state what is needed or instructions.  Will need new order to specify what is needed.

## 2021-03-17 NOTE — Telephone Encounter (Signed)
Hey Dr Val Eagle,  So I am needing the Bipap settings that you would like for this patient if you don't mind. She is wanting to switch DME companies and I am just trying to make sure everything is in order.  And would you like me to schedule a follow up for oxygen. They are switching DME companies and I was told she would need a new walk to cover her for oxygen.  Please advise.  Im looping Francesco Runner in on this message as just an Financial planner

## 2021-03-17 NOTE — Addendum Note (Signed)
Addended by: Retia Passe on: 03/17/2021 04:59 PM   Modules accepted: Orders

## 2021-03-18 ENCOUNTER — Other Ambulatory Visit (HOSPITAL_COMMUNITY): Payer: Self-pay | Admitting: Physician Assistant

## 2021-03-18 DIAGNOSIS — I2694 Multiple subsegmental pulmonary emboli without acute cor pulmonale: Secondary | ICD-10-CM

## 2021-03-18 NOTE — Telephone Encounter (Signed)
This is a Dr. Val Eagle patient and he is out of the office. Is it ok to order a POC for this patient through [facility]? Please advise.

## 2021-03-18 NOTE — Progress Notes (Deleted)
NO SHOW

## 2021-03-18 NOTE — Telephone Encounter (Signed)
Patient is on a trilogy ventilator not a BiPAP  We will need to get a download from previous download/previous company to ascertain settings  can set patient up for a walk

## 2021-03-18 NOTE — Telephone Encounter (Signed)
I called the patient and let the spouse know that per Efraim Kaufmann that she have her team work on the contract for the patient until they can get a contract for the DME supplies.   The spouse voices understanding.

## 2021-03-18 NOTE — Telephone Encounter (Signed)
I called [employer] and per Melissa she is on the Trilogy Vent and she is going to work on getting the patient with the supplies and switched over so she does not have to got to another DME company. Waiting on a call back from [facility].

## 2021-03-19 ENCOUNTER — Inpatient Hospital Stay (HOSPITAL_COMMUNITY): Payer: 59 | Attending: Hematology

## 2021-03-19 ENCOUNTER — Ambulatory Visit (HOSPITAL_COMMUNITY): Payer: 59 | Admitting: Physician Assistant

## 2021-03-19 ENCOUNTER — Telehealth: Payer: Self-pay | Admitting: Pulmonary Disease

## 2021-03-19 NOTE — Telephone Encounter (Signed)
Garner Nash, DO to [facility] Triage Pool      6:06 PM Ok to order  BLI     Referral placed Pt made aware  Nothing further needed

## 2021-03-19 NOTE — Addendum Note (Signed)
Addended by: Christen Butter on: 03/19/2021 10:40 AM   Modules accepted: Orders

## 2021-03-20 NOTE — Telephone Encounter (Signed)
Will await form

## 2021-03-23 ENCOUNTER — Telehealth: Payer: Self-pay | Admitting: Pulmonary Disease

## 2021-03-24 NOTE — Telephone Encounter (Signed)
Patient is already scheduled for a follow up on 05/15/2021 at 4pm with Dr. Ander Slade. Does patient need to be seen sooner than that? Please advise.

## 2021-03-24 NOTE — Telephone Encounter (Signed)
Per Terri at [facility] [employer] she is cancelling the order and we will place new order once she is back in the office. Nothing further needed.

## 2021-03-24 NOTE — Telephone Encounter (Signed)
Please call to make a follow up with a provider or a nurse practitioner. Thanks.

## 2021-05-15 ENCOUNTER — Ambulatory Visit: Payer: 59 | Admitting: Pulmonary Disease

## 2021-05-15 ENCOUNTER — Encounter: Payer: Self-pay | Admitting: Pulmonary Disease

## 2021-05-15 VITALS — BP 146/88 | HR 95 | Temp 98.2°F | Ht 68.0 in | Wt 222.6 lb

## 2021-05-15 DIAGNOSIS — J9612 Chronic respiratory failure with hypercapnia: Secondary | ICD-10-CM | POA: Diagnosis not present

## 2021-05-15 DIAGNOSIS — R0602 Shortness of breath: Secondary | ICD-10-CM

## 2021-05-15 DIAGNOSIS — J9611 Chronic respiratory failure with hypoxia: Secondary | ICD-10-CM | POA: Diagnosis not present

## 2021-05-15 MED ORDER — ALBUTEROL SULFATE HFA 108 (90 BASE) MCG/ACT IN AERS: 2.0000 | INHALATION_SPRAY | Freq: Four times a day (QID) | RESPIRATORY_TRACT | 3 refills | Status: DC | PRN

## 2021-05-15 NOTE — Progress Notes (Signed)
? ?      Christina Vincent?Christina Vincent    [id number]    07/19/1960 ? ?Primary Care Physician:Fanta, Wayland Salinasesfaye Demissie, MD ? ?Referring Physician: Benetta SparFanta, Tesfaye Demissie, MD ?[phone] ?[place],  [place] [id number] ? ?Chief complaint:   ?In for follow-up visit for chronic respiratory failure ? ?HPI: ? ?61 year old lady with history of diabetes, obesity, hypertension, hyperlipidemia ?Recently hospitalized for COVID-19 in December 2021, about 6 weeks hospitalization ?Did have bilateral PEs, severe anemia, MRSA pneumonia, severe anxiety post Covid ? ?She did have a reinfection with COVID recently-did not have significant symptoms ? ?She continues to stay active ?She remains on oxygen supplementation ? ?She is getting more active and trying to exercise regularly ?able to tolerate chores ? ?On a trilogy vent at night ?-ABG while in the hospital showed hypercapnic respiratory failure ? ?She is continuing to improve ? ?Medications for anxiety ? ?Download from a trilogy ventilator shows good compliance ? ?Outpatient Encounter Medications as of 05/15/2021  ?Medication Sig  ? albuterol (PROVENTIL) (2.5 MG/3ML) 0.083% nebulizer solution Take 3 mLs (2.5 mg total) by nebulization every 4 (four) hours as needed for wheezing or shortness of breath.  ? atorvastatin (LIPITOR) 10 MG tablet Take 10 mg by mouth daily.  ? benzonatate (TESSALON) 200 MG capsule Take 1 capsule by mouth three times daily as needed for cough  ? budesonide (PULMICORT) 0.5 MG/2ML nebulizer solution Take 2 mLs (0.5 mg total) by nebulization 2 (two) times daily.  ? chlorhexidine (PERIDEX) 0.12 % solution 15 mLs by Mouth Rinse route 2 (two) times daily.  ? D3-50 1.25 MG (50000 UT) capsule Take 50,000 Units by mouth every 30 (thirty) days.  ? gabapentin (NEURONTIN) 100 MG capsule Take 1 capsule (100 mg total) by mouth 2 (two) times daily.  ? guaiFENesin-codeine 100-10 MG/5ML syrup Take 10 mLs by mouth 3 (three) times daily as needed.  ? hydrocortisone (ANUSOL-HC) 2.5 %  rectal cream Place rectally 2 (two) times daily as needed for hemorrhoids or anal itching.  ? LAGEVRIO 200 MG CAPS capsule SMARTSIG:4 Capsule(s) By Mouth Every 12 Hours  ? lip balm (CARMEX) ointment Apply topically as needed for lip care.  ? losartan (COZAAR) 50 MG tablet Take 50 mg by mouth daily.  ? magic mouthwash SOLN Take 15 mLs by mouth 3 (three) times daily as needed for mouth pain.  ? metFORMIN (GLUCOPHAGE) 850 MG tablet Take 850 mg by mouth 2 (two) times daily.  ? mirtazapine (REMERON) 15 MG tablet Take 1 tablet (15 mg total) by mouth at bedtime.  ? omeprazole (PRILOSEC) 20 MG capsule Take 20 mg by mouth daily.  ? polyethylene glycol (MIRALAX / GLYCOLAX) 17 g packet Take 17 g by mouth daily as needed for moderate constipation.  ? potassium chloride (KLOR-CON) 10 MEQ tablet Take 1 tablet (10 mEq total) by mouth daily.  ? promethazine-dextromethorphan (PROMETHAZINE-DM) 6.25-15 MG/5ML syrup Take 5 mLs by mouth 4 (four) times daily as needed.  ? QUEtiapine (SEROQUEL) 25 MG tablet Take 1 tablet (25 mg total) by mouth 2 (two) times daily.  ? senna-docusate (SENOKOT-S) 8.6-50 MG tablet Take 2 tablets by mouth 2 (two) times daily.  ? traZODone (DESYREL) 50 MG tablet Take 1 tablet (50 mg total) by mouth at bedtime as needed for sleep.  ? ?No facility-administered encounter medications on file as of 05/15/2021.  ? ? ?Allergies as of 05/15/2021  ? (No Known Allergies)  ? ? ?Past Medical History:  ?Diagnosis Date  ? Depression   ?  Dyslipidemia   ? ? ?Past Surgical History:  ?Procedure Laterality Date  ? COLONOSCOPY    ? 1990's; hemorrhoids  ? ? ?Family History  ?Problem Relation Age of Onset  ? Alcohol abuse Father   ? Colon cancer Neg Hx   ? ? ?Social History  ? ?Socioeconomic History  ? Marital status: Married  ?  Spouse name: Not on file  ? Number of children: Not on file  ? Years of education: Not on file  ? Highest education level: Not on file  ?Occupational History  ? Not on file  ?Tobacco Use  ? Smoking status:  Former  ?  Packs/day: 1.00  ?  Years: 8.00  ?  Pack years: 8.00  ?  Types: Cigarettes  ?  Quit date: 03/24/1986  ?  Years since quitting: 35.1  ? Smokeless tobacco: Never  ? Tobacco comments:  ?  quit 1988  ?Vaping Use  ? Vaping Use: Never used  ?Substance and Sexual Activity  ? Alcohol use: Yes  ?  Comment: occ glass of wine  ? Drug use: Never  ? Sexual activity: Not on file  ?Other Topics Concern  ? Not on file  ?Social History Narrative  ? Not on file  ? ?Social Determinants of Health  ? ?Financial Resource Strain: Not on file  ?Food Insecurity: Not on file  ?Transportation Needs: Not on file  ?Physical Activity: Not on file  ?Stress: Not on file  ?Social Connections: Not on file  ?Intimate Partner Violence: Not on file  ? ? ?Review of Systems  ?Constitutional:  Positive for fatigue.  ?Respiratory:  Positive for shortness of breath.   ?Cardiovascular:  Negative for palpitations.  ?Psychiatric/Behavioral:  Positive for sleep disturbance.   ? ?Vitals:  ? 05/15/21 1610  ?BP: (!) 146/88  ?Pulse: 95  ?Temp: 98.2 ?F (36.8 ?C)  ?SpO2: 96%  ? ? ? ?Physical Exam ?Constitutional:   ?   Appearance: Normal appearance.  ?HENT:  ?   Head: Normocephalic.  ?Eyes:  ?   General:     ?   Right eye: No discharge.     ?   Left eye: No discharge.  ?   Pupils: Pupils are equal, round, and reactive to light.  ?Cardiovascular:  ?   Rate and Rhythm: Normal rate and regular rhythm.  ?   Pulses: Normal pulses.  ?   Heart sounds: No murmur heard. ?  No friction rub.  ?Pulmonary:  ?   Effort: No respiratory distress.  ?   Breath sounds: No stridor. No wheezing or rhonchi.  ?Musculoskeletal:  ?   Cervical back: No rigidity or tenderness.  ?Neurological:  ?   Mental Status: She is alert.  ?Psychiatric:     ?   Mood and Affect: Mood normal.  ? ?Data Reviewed: ?CT chest from February 12, 2020 reviewed by myself ?CT scan from 03/14/2020 reviewed by myself ?Echocardiogram 03/04/2020 with diastolic dysfunction ? ?Overnight oximetry was performed on  supplemental oxygen and vent-minimal desaturations ? ?Assessment:  ?Marland Kitchen  Hypercapnic respiratory failure ?-Continue oxygen supplementation ?-Continue to use trilogy ventilator at night ?-Oxygen supplementation to maintain saturations above 90% ? ?Needs a portable oxygen concentrator ?-Qualifying walk today ? ?.  Severe deconditioning ?-This continues to improve ?-Graded exercise as tolerated ? ?.  History of pulmonary embolism ?-On anticoagulation ? ?Obstructive lung disease ?-Continue bronchodilator treatments ? ? ?Plan/Recommendations: ? ?Continue bronchodilators ? ?Prescription for albuterol sent to pharmacy ? ?Qualifying walk for portable concentrator ? ?  Continue trilogy ventilator use at night ? ?Continue graded exercise as tolerated ? ?Did not desaturate during portable oxygen concentrator testing ? ?Virl Diamond, MD ?[facility] ?Pager: [phone] ? ? ?CC: Fanta, Tesfaye Demissie* ? ? ?

## 2021-05-15 NOTE — Patient Instructions (Signed)
Continue to stay very active ? ?Check your oxygen here and there to make sure you are keeping it above 90% ? ?Prescription for albuterol sent in ? ?I will see you back in about 6 months ? ?Call with significant concerns ?

## 2021-05-19 NOTE — Telephone Encounter (Signed)
Pt was just seen by Dr. Jenetta Downer 05/15/21 and was walked at office by Ronney Asters. Routing this to [place] as it looks like no order was placed during pt's OV for the POC. Ronney Asters, please see message from 2/9 from [facility] where they stated that they will need an order for pt's oxygen. ?

## 2021-06-03 ENCOUNTER — Telehealth: Payer: Self-pay | Admitting: Pulmonary Disease

## 2021-06-03 DIAGNOSIS — J9611 Chronic respiratory failure with hypoxia: Secondary | ICD-10-CM

## 2021-06-03 NOTE — Telephone Encounter (Signed)
Called patient's husband but he did not answer. Left message for him to call back.  ?

## 2021-06-03 NOTE — Telephone Encounter (Signed)
Called patient but the call went straight to VM. Left message for him to call us back.  ? ?Per the last OV, it looks like the patient did not qualify for a POC.  ?

## 2021-06-04 NOTE — Telephone Encounter (Signed)
Called [facility]  ?Pt was prescribed POC back in Feb 2023  ?They tried calling pt to give to her and order ended up cancelled bc she never answered the phone  ?I placed a new order  ?Pt's spouse aware to call [facility] for further f/u  ?Nothing further needed ?

## 2021-06-05 ENCOUNTER — Telehealth: Payer: Self-pay | Admitting: Pulmonary Disease

## 2021-06-05 NOTE — Telephone Encounter (Signed)
See enoucnter from 4/26. Aurther Loft states that pt already has supplies/oxygen through [employer]. Aurther Loft called pt and let them know that they only way POC could be supplied through [facility] was if EVERYTHING was transferred to them. Pt did not want to do that. Aurther Loft wanted [place] aware of this.  ?

## 2021-06-05 NOTE — Telephone Encounter (Signed)
Noted. Nothing further needed at this time.  

## 2021-06-23 IMAGING — DX DG CHEST 1V PORT
1 series · 1 of 1 positions shown · non-contrast
Comparison: February 21, 2020

CLINICAL DATA: Dyspnea, COVID with increased shortness of breath

EXAM:
PORTABLE CHEST 1 VIEW

[chest ap]
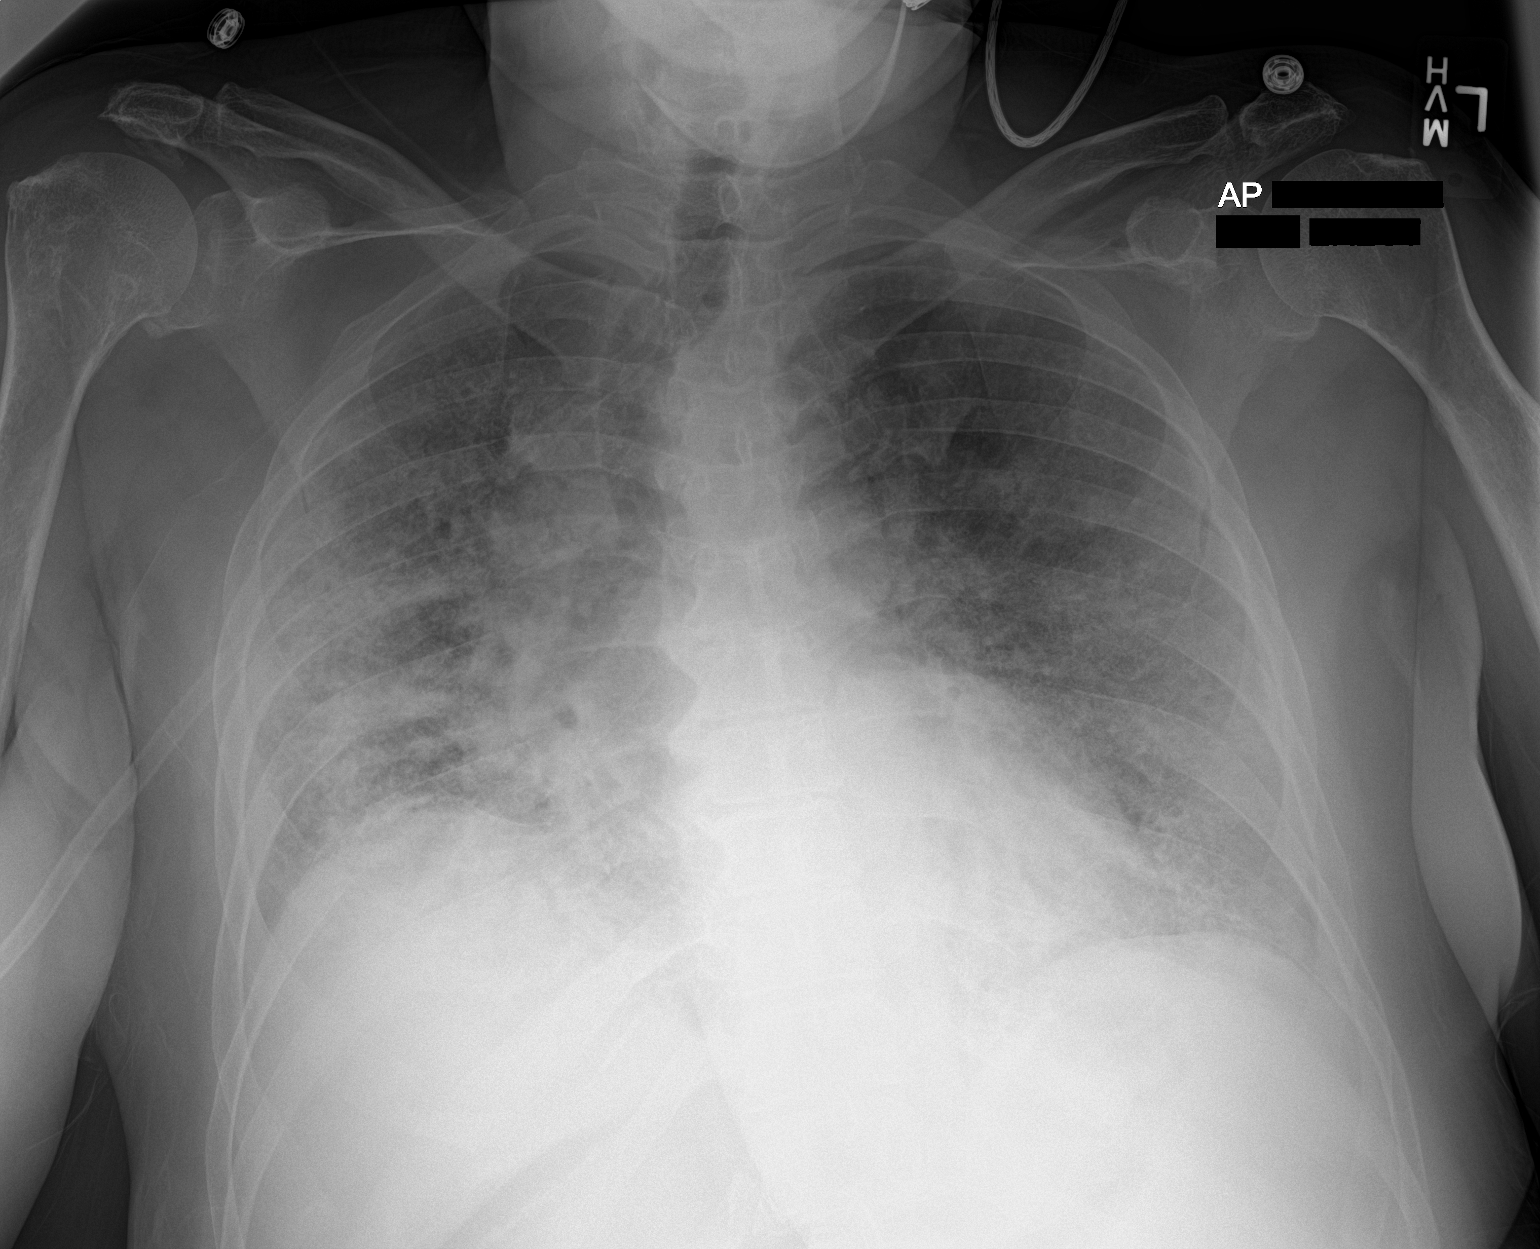

[1 of 1 positions shown; findings below may reference images not displayed]

FINDINGS: Lung volumes are diminished with buckling of the trachea as result.
Image rotated to the RIGHT.

Cardiomediastinal contours are stable.

Interstitial and airspace disease with similar appearance in the
RIGHT chest accounting for rotation compared to February 21, 2020.
Potential slight improvement in the LEFT chest.

On limited assessment no acute skeletal process.
IMPRESSION: Diminished lung volumes compared to the prior study with potential
slight interval improvement in the LEFT chest, otherwise similar
appearance of diffuse interstitial and airspace disease in this
patient with [id number] pneumonia.

## 2021-07-02 IMAGING — DX DG ABDOMEN 1V
2 series · 2 of 2 positions shown · non-contrast
Comparison: 03/04/2020

CLINICAL DATA: Follow-up ileus

EXAM:
ABDOMEN - 1 VIEW

[abdomen kub (1 of 2)]
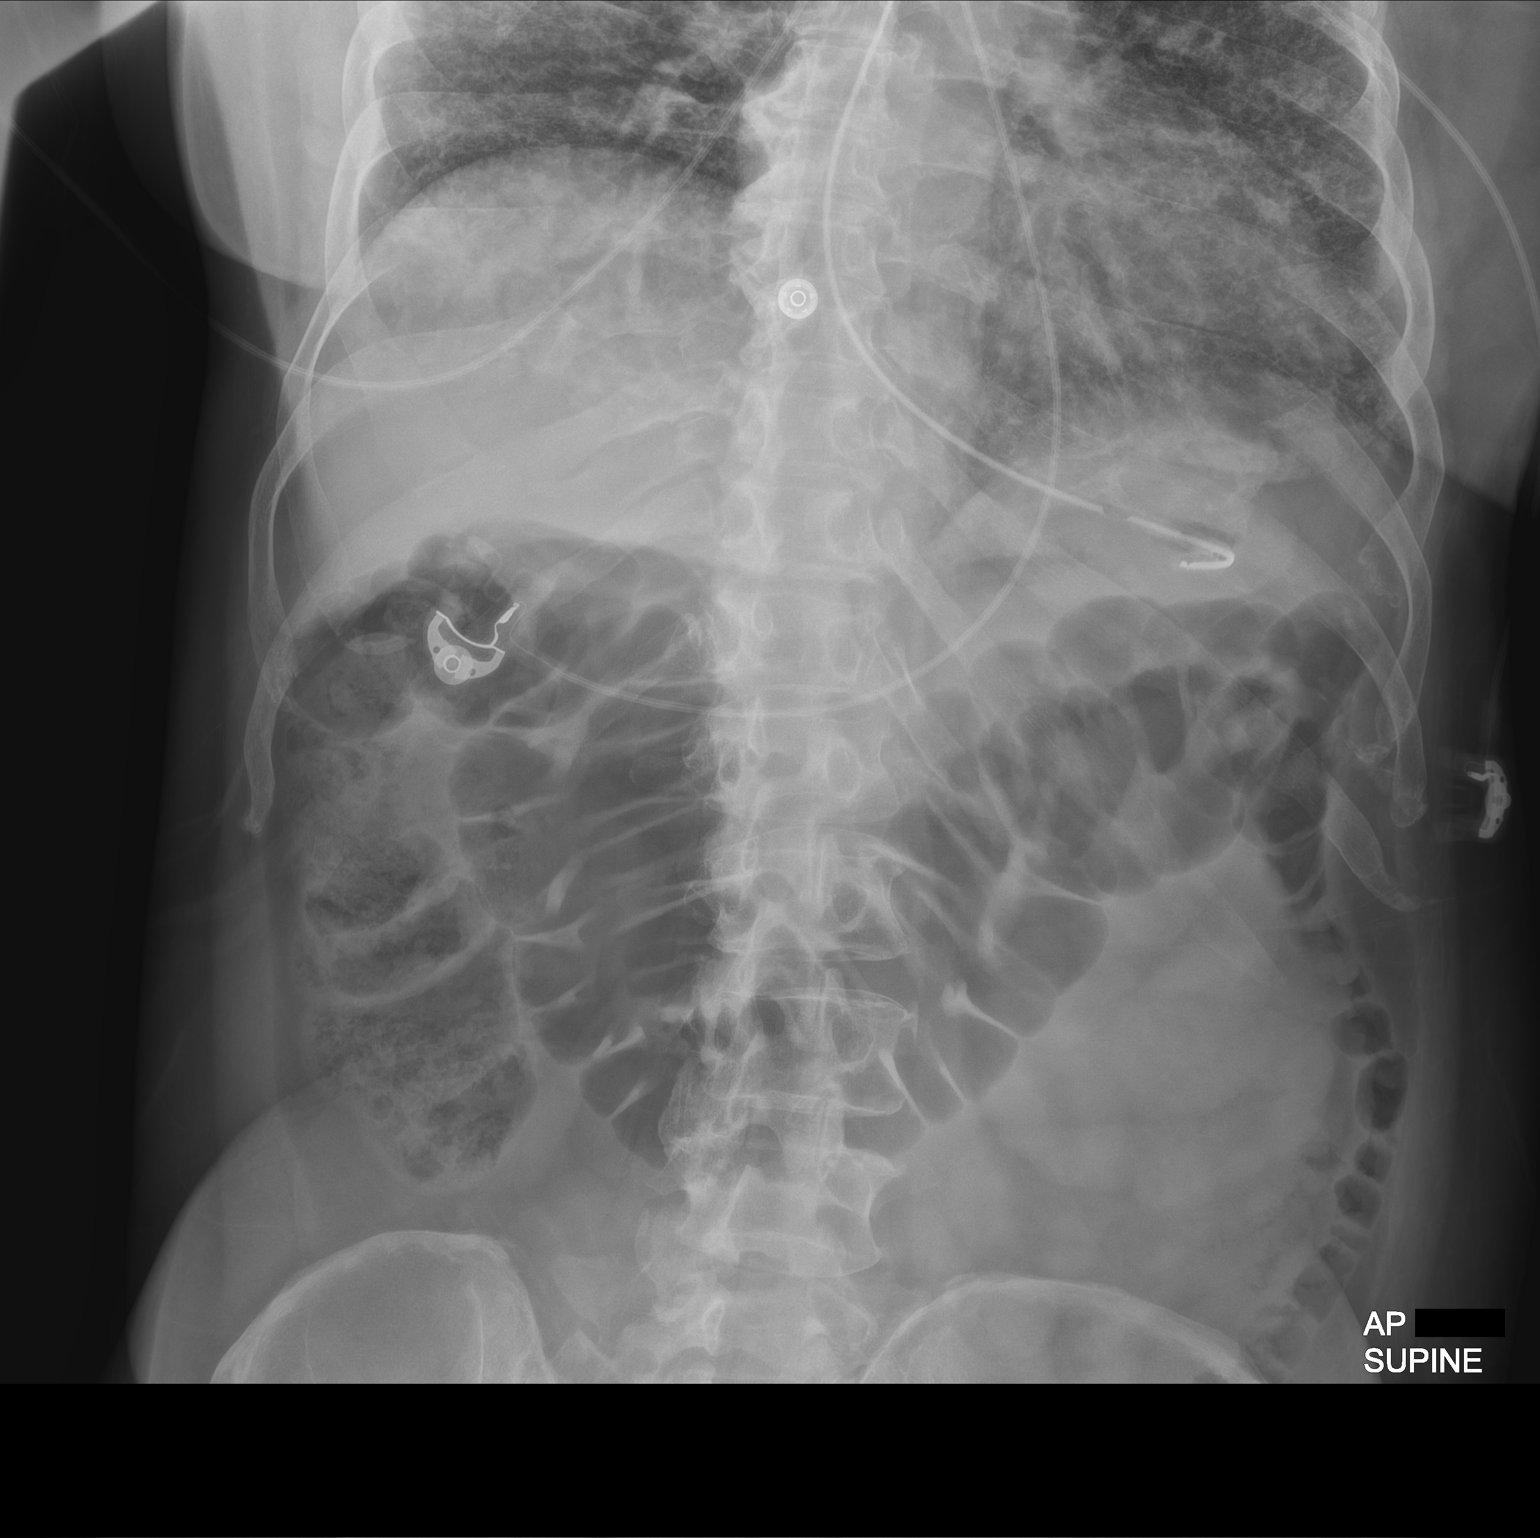

[abdomen kub (2 of 2)]
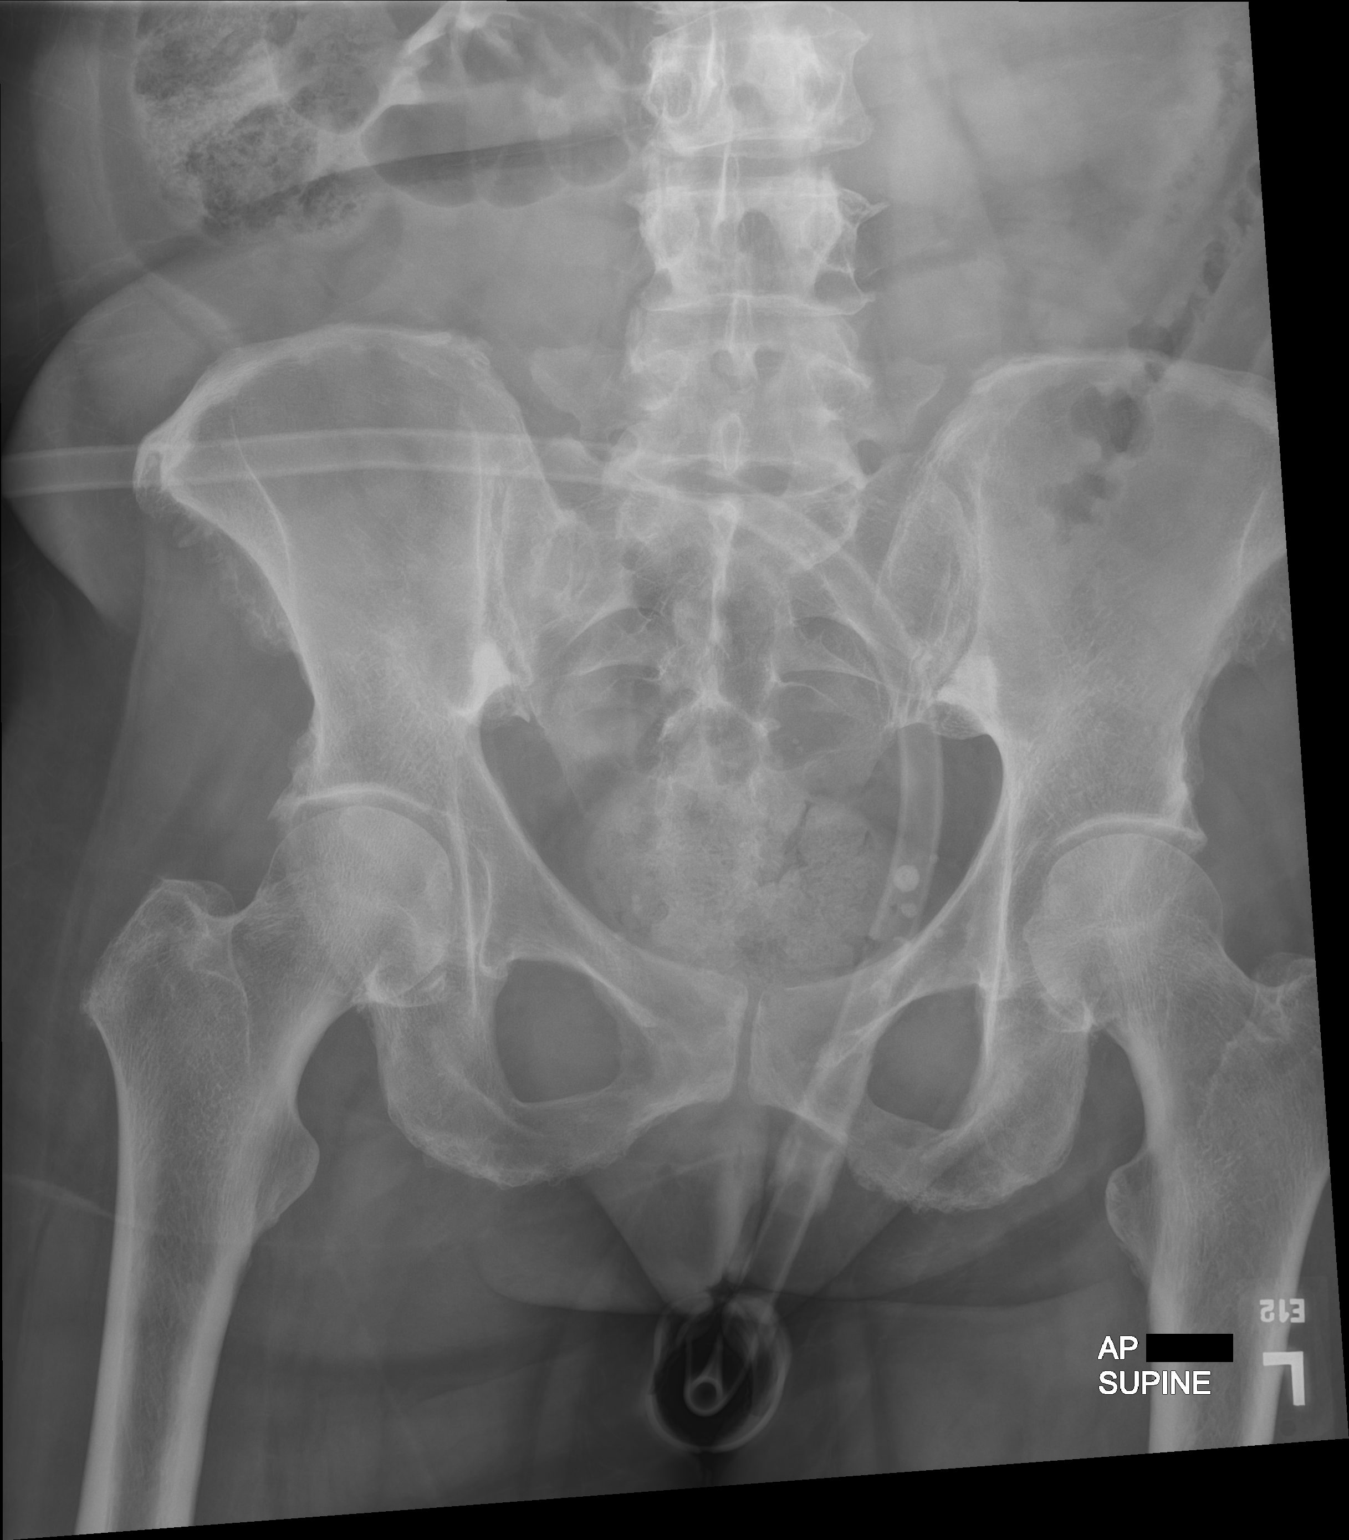

[2 of 2 positions shown; findings below may reference images not displayed]

FINDINGS: Nasogastric tube tip in the fundus of the stomach. Small bowel
pattern is normal. Ordinary colonic gas pattern, with some gas in
the transverse colon, often seen in debilitated supine patients.
IMPRESSION: Nasogastric tube tip in the fundus of the stomach. Unremarkable
bowel gas pattern. See above.

## 2021-07-07 IMAGING — DX DG CHEST 1V PORT
1 series · 1 of 1 positions shown · non-contrast
Comparison: March 02, 2020.

CLINICAL DATA: Acute hypoxemic respiratory failure due to COVID 19.

EXAM:
PORTABLE CHEST 1 VIEW

[chest ap]
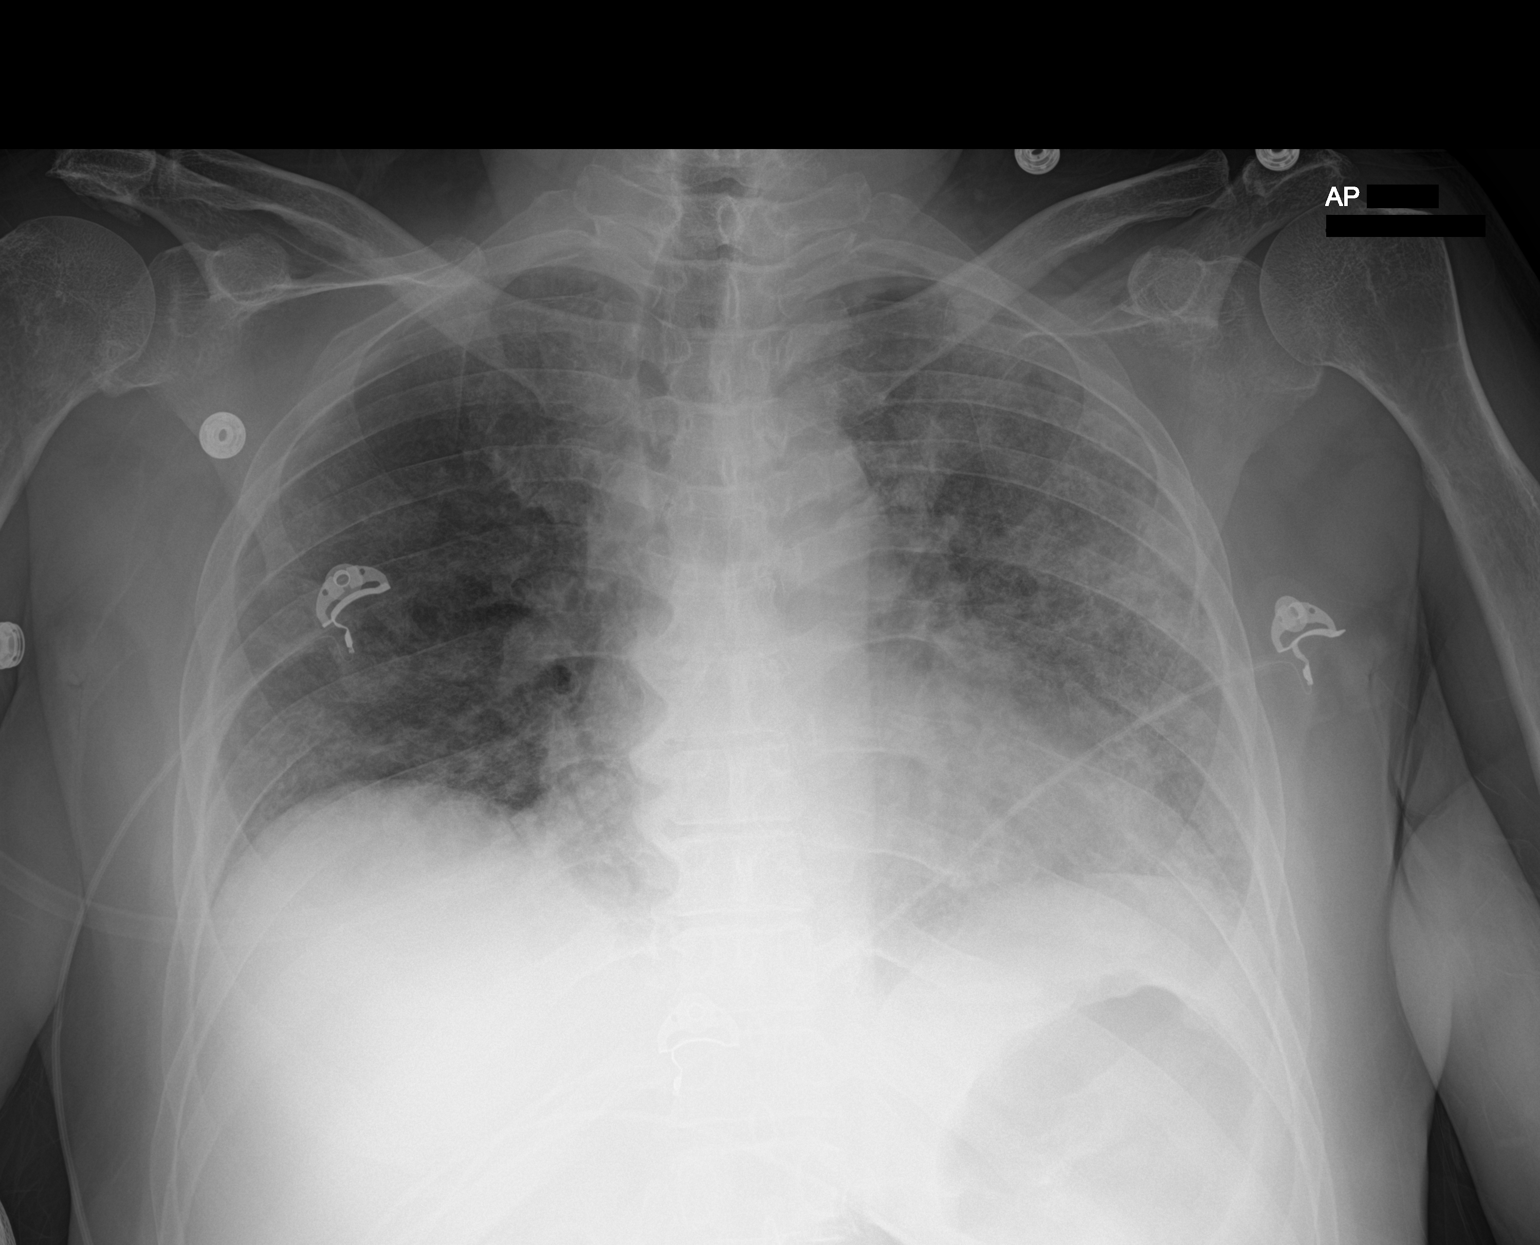

[1 of 1 positions shown; findings below may reference images not displayed]

FINDINGS: The heart size and mediastinal contours are within normal limits. No
pneumothorax or pleural effusion is noted. Patchy airspace opacities
are noted throughout both lungs consistent with multifocal
pneumonia. The visualized skeletal structures are unremarkable.
IMPRESSION: Patchy bilateral lung opacities are noted consistent with multifocal
pneumonia. Followup radiographs are recommended until resolution.

## 2021-08-28 ENCOUNTER — Other Ambulatory Visit: Payer: Self-pay | Admitting: Adult Health

## 2021-10-29 DIAGNOSIS — J9601 Acute respiratory failure with hypoxia: Secondary | ICD-10-CM | POA: Diagnosis not present

## 2021-10-29 DIAGNOSIS — I2699 Other pulmonary embolism without acute cor pulmonale: Secondary | ICD-10-CM | POA: Diagnosis not present

## 2021-11-28 DIAGNOSIS — J9601 Acute respiratory failure with hypoxia: Secondary | ICD-10-CM | POA: Diagnosis not present

## 2021-11-28 DIAGNOSIS — I2699 Other pulmonary embolism without acute cor pulmonale: Secondary | ICD-10-CM | POA: Diagnosis not present

## 2021-12-09 ENCOUNTER — Ambulatory Visit: Payer: 59 | Admitting: Pulmonary Disease

## 2021-12-11 ENCOUNTER — Encounter: Payer: Self-pay | Admitting: Pulmonary Disease

## 2021-12-11 ENCOUNTER — Ambulatory Visit: Payer: 59 | Admitting: Pulmonary Disease

## 2021-12-11 VITALS — BP 122/80 | HR 87

## 2021-12-11 DIAGNOSIS — J9612 Chronic respiratory failure with hypercapnia: Secondary | ICD-10-CM

## 2021-12-11 DIAGNOSIS — J9611 Chronic respiratory failure with hypoxia: Secondary | ICD-10-CM

## 2021-12-11 NOTE — Patient Instructions (Signed)
I will see you back in about 3 months  Ambulatory pulse ox today to see whether you qualify for portable oxygen concentrator  Continue to try to check your oxygen levels in different situations to see whether you can give yourself a break off the oxygen  Continue using your trilogy ventilator at night  Call with significant concerns  Graded exercises as tolerated

## 2021-12-11 NOTE — Progress Notes (Unsigned)
Christina Vincent    [id number]    09/29/1960  Primary Care Physician:Fanta, Wayland Salinas, MD  Referring Physician: Benetta Spar, MD [address] [place],  [place] [address]  Chief complaint:   In for follow-up visit for chronic respiratory failure  HPI:  61 year old lady with history of diabetes, obesity, hypertension, hyperlipidemia  Hospitalized for COVID-19 in December 2021, was in the hospital for about 6 weeks Bilateral PEs, severe anemia, MRSA pneumonia, she does have severe anxiety post-COVID  She has come along way She is able to ambulate She is trying to get stronger generally  She is still on oxygen supplementation  She tries to stay active  On a trilogy vent at night -ABG while in the hospital showed hypercapnic respiratory failure  She is continuing to improve  Medications for anxiety  Outpatient Encounter Medications as of 12/11/2021  Medication Sig   albuterol (PROVENTIL) (2.5 MG/3ML) 0.083% nebulizer solution Take 3 mLs (2.5 mg total) by nebulization every 4 (four) hours as needed for wheezing or shortness of breath.   albuterol (VENTOLIN HFA) 108 (90 Base) MCG/ACT inhaler Inhale 2 puffs into the lungs every 6 (six) hours as needed for wheezing or shortness of breath.   atorvastatin (LIPITOR) 10 MG tablet Take 10 mg by mouth daily.   benzonatate (TESSALON) 200 MG capsule Take 1 capsule by mouth three times daily as needed for cough   budesonide (PULMICORT) 0.5 MG/2ML nebulizer solution Take 2 mLs (0.5 mg total) by nebulization 2 (two) times daily.   chlorhexidine (PERIDEX) 0.12 % solution 15 mLs by Mouth Rinse route 2 (two) times daily.   D3-50 1.25 MG (50000 UT) capsule Take 50,000 Units by mouth every 30 (thirty) days.   gabapentin (NEURONTIN) 100 MG capsule Take 1 capsule (100 mg total) by mouth 2 (two) times daily.   guaiFENesin-codeine 100-10 MG/5ML syrup Take 10 mLs by mouth 3 (three) times daily as needed.    hydrocortisone (ANUSOL-HC) 2.5 % rectal cream Place rectally 2 (two) times daily as needed for hemorrhoids or anal itching.   LAGEVRIO 200 MG CAPS capsule SMARTSIG:4 Capsule(s) By Mouth Every 12 Hours   lip balm (CARMEX) ointment Apply topically as needed for lip care.   losartan (COZAAR) 50 MG tablet Take 50 mg by mouth daily.   magic mouthwash SOLN Take 15 mLs by mouth 3 (three) times daily as needed for mouth pain.   metFORMIN (GLUCOPHAGE) 850 MG tablet Take 850 mg by mouth 2 (two) times daily.   mirtazapine (REMERON) 15 MG tablet Take 1 tablet (15 mg total) by mouth at bedtime.   omeprazole (PRILOSEC) 20 MG capsule Take 20 mg by mouth daily.   polyethylene glycol (MIRALAX / GLYCOLAX) 17 g packet Take 17 g by mouth daily as needed for moderate constipation.   potassium chloride (KLOR-CON) 10 MEQ tablet Take 1 tablet (10 mEq total) by mouth daily.   promethazine-dextromethorphan (PROMETHAZINE-DM) 6.25-15 MG/5ML syrup Take 5 mLs by mouth 4 (four) times daily as needed.   QUEtiapine (SEROQUEL) 25 MG tablet Take 1 tablet (25 mg total) by mouth 2 (two) times daily.   senna-docusate (SENOKOT-S) 8.6-50 MG tablet Take 2 tablets by mouth 2 (two) times daily.   traZODone (DESYREL) 50 MG tablet Take 1 tablet (50 mg total) by mouth at bedtime as needed for sleep.   No facility-administered encounter medications on file as of 12/11/2021.    Allergies as of 12/11/2021   (No Known Allergies)  Past Medical History:  Diagnosis Date   Depression    Dyslipidemia     Past Surgical History:  Procedure Laterality Date   COLONOSCOPY     1990's; hemorrhoids    Family History  Problem Relation Age of Onset   Alcohol abuse Father    Colon cancer Neg Hx     Social History   Socioeconomic History   Marital status: Married    Spouse name: Not on file   Number of children: Not on file   Years of education: Not on file   Highest education level: Not on file  Occupational History   Not on file   Tobacco Use   Smoking status: Former    Packs/day: 1.00    Years: 8.00    Total pack years: 8.00    Types: Cigarettes    Quit date: 03/24/1986    Years since quitting: 35.7   Smokeless tobacco: Never   Tobacco comments:    quit 1988  Vaping Use   Vaping Use: Never used  Substance and Sexual Activity   Alcohol use: Yes    Comment: occ glass of wine   Drug use: Never   Sexual activity: Not on file  Other Topics Concern   Not on file  Social History Narrative   Not on file   Social Determinants of Health   Financial Resource Strain: Not on file  Food Insecurity: Not on file  Transportation Needs: Not on file  Physical Activity: Not on file  Stress: Not on file  Social Connections: Not on file  Intimate Partner Violence: Not At Risk (04/18/2020)   Humiliation, Afraid, Rape, and Kick questionnaire    Fear of Current or Ex-Partner: No    Emotionally Abused: No    Physically Abused: No    Sexually Abused: No    Review of Systems  Constitutional:  Positive for fatigue.  Respiratory:  Positive for shortness of breath.   Cardiovascular:  Negative for palpitations.  Psychiatric/Behavioral:  Positive for sleep disturbance.     There were no vitals filed for this visit.    Physical Exam Constitutional:      Appearance: Normal appearance.  HENT:     Head: Normocephalic.     Mouth/Throat:     Mouth: Mucous membranes are moist.  Eyes:     General:        Right eye: No discharge.        Left eye: No discharge.     Pupils: Pupils are equal, round, and reactive to light.  Cardiovascular:     Rate and Rhythm: Normal rate and regular rhythm.     Pulses: Normal pulses.     Heart sounds: No murmur heard.    No friction rub.  Pulmonary:     Effort: No respiratory distress.     Breath sounds: No stridor. No wheezing or rhonchi.  Musculoskeletal:     Cervical back: No rigidity or tenderness.  Neurological:     Mental Status: She is alert.  Psychiatric:        Mood and  Affect: Mood normal.    Data Reviewed: CT chest from February 12, 2020 reviewed by myself CT scan from 03/14/2020 reviewed by myself Echocardiogram 8/67/6195 with diastolic dysfunction  Overnight oximetry was performed on supplemental oxygen and vent-minimal desaturations noted  Assessment:   .  Hypercapnic respiratory failure -Continue oxygen supplementation -Continue trilogy ventilator at night -Oxygen supplementation to maintain saturations above 90%  .  Severe deconditioning -Continues  to improve  .  History of pulmonary embolism -Continues on anticoagulation  Obstructive lung disease -Continue bronchodilator treatments  Plan/Recommendations:   Portable oxygen concentrator walk today  Continue graded exercises as tolerated  Continue trilogy ventilator at night  Follow-up in 3 months  Did not desaturate on ambulation   Virl Diamond, MD [facility] Pager: [phone]   CC: Benetta Spar*

## 2021-12-15 DIAGNOSIS — J9610 Chronic respiratory failure, unspecified whether with hypoxia or hypercapnia: Secondary | ICD-10-CM | POA: Diagnosis not present

## 2021-12-15 DIAGNOSIS — J984 Other disorders of lung: Secondary | ICD-10-CM | POA: Diagnosis not present

## 2021-12-29 DIAGNOSIS — J9601 Acute respiratory failure with hypoxia: Secondary | ICD-10-CM | POA: Diagnosis not present

## 2021-12-29 DIAGNOSIS — I2699 Other pulmonary embolism without acute cor pulmonale: Secondary | ICD-10-CM | POA: Diagnosis not present

## 2022-01-16 ENCOUNTER — Other Ambulatory Visit: Payer: Self-pay

## 2022-01-16 ENCOUNTER — Ambulatory Visit (HOSPITAL_COMMUNITY)
Admission: EM | Admit: 2022-01-16 | Discharge: 2022-01-16 | Disposition: A | Discharge status: Home / Self Care | Payer: 59 | Attending: Physician Assistant | Admitting: Physician Assistant

## 2022-01-16 DIAGNOSIS — Z1152 Encounter for screening for COVID-19: Secondary | ICD-10-CM | POA: Insufficient documentation

## 2022-01-16 DIAGNOSIS — R0602 Shortness of breath: Secondary | ICD-10-CM | POA: Insufficient documentation

## 2022-01-16 DIAGNOSIS — J111 Influenza due to unidentified influenza virus with other respiratory manifestations: Secondary | ICD-10-CM | POA: Insufficient documentation

## 2022-01-16 DIAGNOSIS — J069 Acute upper respiratory infection, unspecified: Secondary | ICD-10-CM | POA: Diagnosis not present

## 2022-01-16 DIAGNOSIS — R051 Acute cough: Secondary | ICD-10-CM | POA: Diagnosis not present

## 2022-01-16 LAB — POC INFLUENZA A AND B ANTIGEN (URGENT CARE ONLY)
INFLUENZA A ANTIGEN, POC: POSITIVE — AB
INFLUENZA B ANTIGEN, POC: NEGATIVE

## 2022-01-16 MED ORDER — OSELTAMIVIR PHOSPHATE 75 MG PO CAPS: 75.0000 mg | ORAL_CAPSULE | Freq: Two times a day (BID) | ORAL | 0 refills | Status: AC

## 2022-01-16 MED ORDER — ALBUTEROL SULFATE (2.5 MG/3ML) 0.083% IN NEBU
INHALATION_SOLUTION | RESPIRATORY_TRACT | Status: AC
  Filled 2022-01-16: qty 3

## 2022-01-16 MED ORDER — OSELTAMIVIR PHOSPHATE 75 MG PO CAPS: 75.0000 mg | ORAL_CAPSULE | Freq: Two times a day (BID) | ORAL | 0 refills | Status: DC

## 2022-01-16 MED ORDER — ALBUTEROL SULFATE (2.5 MG/3ML) 0.083% IN NEBU
2.5000 mg | INHALATION_SOLUTION | Freq: Once | RESPIRATORY_TRACT | Status: AC
  Administered 2022-01-16: 2.5 mg via RESPIRATORY_TRACT

## 2022-01-16 MED ORDER — DM-GUAIFENESIN ER 30-600 MG PO TB12: 1.0000 | ORAL_TABLET | Freq: Two times a day (BID) | ORAL | 0 refills | Status: AC

## 2022-01-16 MED ORDER — DM-GUAIFENESIN ER 30-600 MG PO TB12: 1.0000 | ORAL_TABLET | Freq: Two times a day (BID) | ORAL | 0 refills | Status: DC

## 2022-01-16 NOTE — ED Provider Notes (Signed)
[facility]    CSN: [id number] Arrival date & time: 01/16/22  1614      History   Chief Complaint Chief Complaint  Patient presents with   Fever   Cough   Shortness of Breath    HPI Christina Vincent is a 61 y.o. female.   61 year old female presents with fever, cough and shortness of breath.  Patient indicates that she has respiratory problems and has been followed by pulmonologist.  Patient indicates that she is on O2 on a regular basis and she is presently trying to be weaned off supplemental O2.  Patient indicates for the past 2 days she has been having some mild cough, congestion, clear production, with shortness of breath and intermittent wheezing.  Patient indicates she has been doing her nebulizer treatments at home which has been helping a little bit but today she felt a little bit more short of breath than usual.  Patient indicates that yesterday she started running fever which has been up to 102.  Patient indicates she has had some mild fatigue along with joint soreness.  Patient denies any nausea or vomiting.  Patient is concerned and would like to be tested for COVID.  Patient denies being around any family or friends with respiratory symptoms.   Fever Associated symptoms: cough   Cough Associated symptoms: fever and shortness of breath   Shortness of Breath Associated symptoms: cough and fever     Past Medical History:  Diagnosis Date   Depression    Dyslipidemia     Patient Active Problem List   Diagnosis Date Noted   Pneumonia due to COVID-19 virus 03/24/2020   Chronic respiratory failure with hypoxia and hypercapnia (HCC) 03/24/2020   Physical deconditioning 03/24/2020   Anemia 03/24/2020   Pulmonary emboli (HCC) 02/23/2020   2019 novel coronavirus disease (COVID-19) 02/04/2020   Hypertension    Type 2 diabetes mellitus (HCC)    Dyslipidemia    Depression    Class 1 obesity    Rectal burning 12/28/2019   Rectal bleeding 12/28/2019   Colon  cancer screening 12/28/2019    Past Surgical History:  Procedure Laterality Date   COLONOSCOPY     1990's; hemorrhoids    OB History   No obstetric history on file.      Home Medications    Prior to Admission medications   Medication Sig Start Date End Date Taking? Authorizing Provider  dextromethorphan-guaiFENesin (MUCINEX DM) 30-600 MG 12hr tablet Take 1 tablet by mouth 2 (two) times daily. 01/16/22  Yes Ellsworth Lennox, PA-C  oseltamivir (TAMIFLU) 75 MG capsule Take 1 capsule (75 mg total) by mouth every 12 (twelve) hours. 01/16/22  Yes Ellsworth Lennox, PA-C  albuterol (PROVENTIL) (2.5 MG/3ML) 0.083% nebulizer solution Take 3 mLs (2.5 mg total) by nebulization every 4 (four) hours as needed for wheezing or shortness of breath. 03/21/20   Rhetta Mura, MD  albuterol (VENTOLIN HFA) 108 (90 Base) MCG/ACT inhaler Inhale 2 puffs into the lungs every 6 (six) hours as needed for wheezing or shortness of breath. 05/15/21   Olalere, Onnie Boer A, MD  atorvastatin (LIPITOR) 10 MG tablet Take 10 mg by mouth daily. 11/16/19   [provider]  benzonatate (TESSALON) 200 MG capsule Take 1 capsule by mouth three times daily as needed for cough 08/28/21   Parrett, Tammy S, NP  budesonide (PULMICORT) 0.5 MG/2ML nebulizer solution Take 2 mLs (0.5 mg total) by nebulization 2 (two) times daily. 03/21/20   Rhetta Mura, MD  chlorhexidine (PERIDEX) 0.12 % solution 15 mLs by Mouth Rinse route 2 (two) times daily. 03/21/20   Rhetta MuraSamtani, Jai-Gurmukh, MD  D3-50 1.25 MG (50000 UT) capsule Take 50,000 Units by mouth every 30 (thirty) days. 01/29/20   [provider]  gabapentin (NEURONTIN) 100 MG capsule Take 1 capsule (100 mg total) by mouth 2 (two) times daily. 03/21/20   Rhetta MuraSamtani, Jai-Gurmukh, MD  guaiFENesin-codeine 100-10 MG/5ML syrup Take 10 mLs by mouth 3 (three) times daily as needed. 01/15/21   [provider]  hydrocortisone (ANUSOL-HC) 2.5 % rectal cream Place rectally 2 (two)  times daily as needed for hemorrhoids or anal itching. 03/21/20   Rhetta MuraSamtani, Jai-Gurmukh, MD  LAGEVRIO 200 MG CAPS capsule SMARTSIG:4 Capsule(s) By Mouth Every 12 Hours 01/14/21   [provider]  lip balm (CARMEX) ointment Apply topically as needed for lip care. 03/21/20   Rhetta MuraSamtani, Jai-Gurmukh, MD  losartan (COZAAR) 50 MG tablet Take 50 mg by mouth daily. 04/30/20   [provider]  magic mouthwash SOLN Take 15 mLs by mouth 3 (three) times daily as needed for mouth pain. 03/21/20   Rhetta MuraSamtani, Jai-Gurmukh, MD  metFORMIN (GLUCOPHAGE) 850 MG tablet Take 850 mg by mouth 2 (two) times daily. 11/16/19   [provider]  mirtazapine (REMERON) 15 MG tablet Take 1 tablet (15 mg total) by mouth at bedtime. 03/21/20   Rhetta MuraSamtani, Jai-Gurmukh, MD  omeprazole (PRILOSEC) 20 MG capsule Take 20 mg by mouth daily. 04/04/20   [provider]  polyethylene glycol (MIRALAX / GLYCOLAX) 17 g packet Take 17 g by mouth daily as needed for moderate constipation. 03/21/20   Rhetta MuraSamtani, Jai-Gurmukh, MD  potassium chloride (KLOR-CON) 10 MEQ tablet Take 1 tablet (10 mEq total) by mouth daily. 03/26/20   Parrett, Virgel Bouquetammy S, NP  promethazine-dextromethorphan (PROMETHAZINE-DM) 6.25-15 MG/5ML syrup Take 5 mLs by mouth 4 (four) times daily as needed. 04/03/20   [provider]  QUEtiapine (SEROQUEL) 25 MG tablet Take 1 tablet (25 mg total) by mouth 2 (two) times daily. 03/21/20   Rhetta MuraSamtani, Jai-Gurmukh, MD  senna-docusate (SENOKOT-S) 8.6-50 MG tablet Take 2 tablets by mouth 2 (two) times daily. 03/21/20   Rhetta MuraSamtani, Jai-Gurmukh, MD  traZODone (DESYREL) 50 MG tablet Take 1 tablet (50 mg total) by mouth at bedtime as needed for sleep. 03/21/20   Rhetta MuraSamtani, Jai-Gurmukh, MD    Family History Family History  Problem Relation Age of Onset   Alcohol abuse Father    Colon cancer Neg Hx     Social History Social History   Tobacco Use   Smoking status: Former    Packs/day: 1.00    Years: 8.00    Total pack years:  8.00    Types: Cigarettes    Quit date: 03/24/1986    Years since quitting: 35.8   Smokeless tobacco: Never   Tobacco comments:    quit 1988  Vaping Use   Vaping Use: Never used  Substance Use Topics   Alcohol use: Yes    Comment: occ glass of wine   Drug use: Never     Allergies   Patient has no known allergies.   Review of Systems Review of Systems  Constitutional:  Positive for fever.  Respiratory:  Positive for cough and shortness of breath.      Physical Exam Triage Vital Signs ED Triage Vitals  Enc Vitals Group     BP 01/16/22 1713 (!) 143/80     Pulse Rate 01/16/22 1713 (!) 129     Resp 01/16/22  1713 20     Temp 01/16/22 1713 100.1 F (37.8 C)     Temp src --      SpO2 01/16/22 1713 90 %     Weight --      Height --      Head Circumference --      Peak Flow --      Pain Score 01/16/22 1708 0     Pain Loc --      Pain Edu? --      Excl. in GC? --    No data found.  Updated Vital Signs BP (!) 143/80   Pulse (!) 129   Temp 100.1 F (37.8 C)   Resp 20   SpO2 90%   Visual Acuity Right Eye Distance:   Left Eye Distance:   Bilateral Distance:    Right Eye Near:   Left Eye Near:    Bilateral Near:     Physical Exam Constitutional:      Appearance: She is well-developed.  HENT:     Right Ear: Tympanic membrane and ear canal normal.     Left Ear: Tympanic membrane and ear canal normal.     Mouth/Throat:     Mouth: Mucous membranes are moist.     Pharynx: Oropharynx is clear.  Cardiovascular:     Rate and Rhythm: Normal rate and regular rhythm.     Heart sounds: Normal heart sounds.  Pulmonary:     Effort: Pulmonary effort is normal.     Breath sounds: Normal breath sounds and air entry. No wheezing, rhonchi or rales.     Comments: Patient is supplemental O2 to be in the exam room. Lymphadenopathy:     Cervical: No cervical adenopathy.  Neurological:     Mental Status: She is alert.      UC Treatments / Results  Labs (all labs  ordered are listed, but only abnormal results are displayed) Labs Reviewed  POC INFLUENZA A AND B ANTIGEN (URGENT CARE ONLY) - Abnormal; Notable for the following components:      Result Value   INFLUENZA A ANTIGEN, POC POSITIVE (*)    All other components within normal limits  SARS CORONAVIRUS 2 (TAT 6-24 HRS)    EKG   Radiology No results found.  Procedures Procedures (including critical care time)  Medications Ordered in UC Medications  albuterol (PROVENTIL) (2.5 MG/3ML) 0.083% nebulizer solution 2.5 mg (2.5 mg Nebulization Given 01/16/22 1729)    Initial Impression / Assessment and Plan / UC Course  I have reviewed the triage vital signs and the nursing notes.  Pertinent labs & imaging results that were available during my care of the patient were reviewed by me and considered in my medical decision making (see chart for details).    Plan: 1.  The upper respiratory tract infection be treated with the following: A.  Advised Mucinex DM every 12 hours to help with cough and congestion. 2.  Shortness of breath be treated with the following: A.  Albuterol nebulizer 1.03% given in the exam room. B.  Patient advised to continue at home nebulized breathing treatments on a regular basis over the next several days. 3.  The acute cough be treated with the following: A.  Mucinex DM every 12 hours to help with cough and congestion. 4.  Screen for COVID-19 return to the following: A.  Treatment with plan depending on the COVID test results. 5.  The family will be treated with the following: A.  Tamiflu  75 mg twice daily until completed. 6.  Patient advised follow-up PCP or return to urgent care if symptoms fail to improve. Final Clinical Impressions(s) / UC Diagnoses   Final diagnoses:  Viral upper respiratory tract infection  Shortness of breath  Acute cough  Encounter for screening for COVID-19  Flu     Discharge Instructions      The COVID test will be completed in 48  hours.  If you do not get a call from this office that indicates the test is negative.  Log onto MyChart to review the test results from the post in 48 hours. Advised to take Mucinex DM every 12 hours to help decrease cough and congestion. Advised to use Tylenol or ibuprofen for fever and discomfort. Advised to continue to use the albuterol nebulizer treatments as needed along with supplemental O2 therapy. Advised to follow-up PCP or return to urgent care as needed.     ED Prescriptions     Medication Sig Dispense Auth. Provider   dextromethorphan-guaiFENesin (MUCINEX DM) 30-600 MG 12hr tablet Take 1 tablet by mouth 2 (two) times daily. 20 tablet Ellsworth Lennox, PA-C   oseltamivir (TAMIFLU) 75 MG capsule Take 1 capsule (75 mg total) by mouth every 12 (twelve) hours. 10 capsule Ellsworth Lennox, PA-C      PDMP not reviewed this encounter.   Ellsworth Lennox, PA-C 01/16/22 1806

## 2022-01-16 NOTE — ED Triage Notes (Signed)
Pt reports her Sx's  cough ,fever and SHOB started yesterday. Pt has used her Neb /machine with some relief of SHOB. Pt uses Nasal O2 at home .

## 2022-01-16 NOTE — Discharge Instructions (Addendum)
The COVID test will be completed in 48 hours.  If you do not get a call from this office that indicates the test is negative.  Log onto MyChart to review the test results from the post in 48 hours. Advised to take Mucinex DM every 12 hours to help decrease cough and congestion. Advised to use Tylenol or ibuprofen for fever and discomfort. Advised to continue to use the albuterol nebulizer treatments as needed along with supplemental O2 therapy. Advised to follow-up PCP or return to urgent care as needed.

## 2022-01-17 LAB — SARS CORONAVIRUS 2 (TAT 6-24 HRS): SARS Coronavirus 2: NEGATIVE

## 2022-01-28 DIAGNOSIS — J984 Other disorders of lung: Secondary | ICD-10-CM | POA: Diagnosis not present

## 2022-01-28 DIAGNOSIS — I2699 Other pulmonary embolism without acute cor pulmonale: Secondary | ICD-10-CM | POA: Diagnosis not present

## 2022-01-28 DIAGNOSIS — J9610 Chronic respiratory failure, unspecified whether with hypoxia or hypercapnia: Secondary | ICD-10-CM | POA: Diagnosis not present

## 2022-01-28 DIAGNOSIS — J9601 Acute respiratory failure with hypoxia: Secondary | ICD-10-CM | POA: Diagnosis not present

## 2022-02-28 DIAGNOSIS — J984 Other disorders of lung: Secondary | ICD-10-CM | POA: Diagnosis not present

## 2022-02-28 DIAGNOSIS — I2699 Other pulmonary embolism without acute cor pulmonale: Secondary | ICD-10-CM | POA: Diagnosis not present

## 2022-02-28 DIAGNOSIS — J9610 Chronic respiratory failure, unspecified whether with hypoxia or hypercapnia: Secondary | ICD-10-CM | POA: Diagnosis not present

## 2022-02-28 DIAGNOSIS — J9601 Acute respiratory failure with hypoxia: Secondary | ICD-10-CM | POA: Diagnosis not present

## 2022-03-03 DIAGNOSIS — J9610 Chronic respiratory failure, unspecified whether with hypoxia or hypercapnia: Secondary | ICD-10-CM | POA: Diagnosis not present

## 2022-03-03 DIAGNOSIS — E7849 Other hyperlipidemia: Secondary | ICD-10-CM | POA: Diagnosis not present

## 2022-03-03 DIAGNOSIS — E118 Type 2 diabetes mellitus with unspecified complications: Secondary | ICD-10-CM | POA: Diagnosis not present

## 2022-03-03 DIAGNOSIS — I700 Atherosclerosis of aorta: Secondary | ICD-10-CM | POA: Diagnosis not present

## 2022-03-03 DIAGNOSIS — R69 Illness, unspecified: Secondary | ICD-10-CM | POA: Diagnosis not present

## 2022-03-03 DIAGNOSIS — I10 Essential (primary) hypertension: Secondary | ICD-10-CM | POA: Diagnosis not present

## 2022-03-31 DIAGNOSIS — I2699 Other pulmonary embolism without acute cor pulmonale: Secondary | ICD-10-CM | POA: Diagnosis not present

## 2022-03-31 DIAGNOSIS — J9601 Acute respiratory failure with hypoxia: Secondary | ICD-10-CM | POA: Diagnosis not present

## 2022-03-31 DIAGNOSIS — J984 Other disorders of lung: Secondary | ICD-10-CM | POA: Diagnosis not present

## 2022-03-31 DIAGNOSIS — J9610 Chronic respiratory failure, unspecified whether with hypoxia or hypercapnia: Secondary | ICD-10-CM | POA: Diagnosis not present

## 2022-04-06 IMAGING — MG MM DIGITAL SCREENING BILAT W/ TOMO AND CAD
8 series · 8 of 24 positions shown · non-contrast
Comparison: Previous exam(s).

CLINICAL DATA: Screening.

EXAM:
DIGITAL SCREENING BILATERAL MAMMOGRAM WITH TOMOSYNTHESIS AND CAD
TECHNIQUE: Bilateral screening digital craniocaudal and mediolateral oblique
mammograms were obtained. Bilateral screening digital breast
tomosynthesis was performed. The images were evaluated with
computer-aided detection.

[R MLO synth-2D]
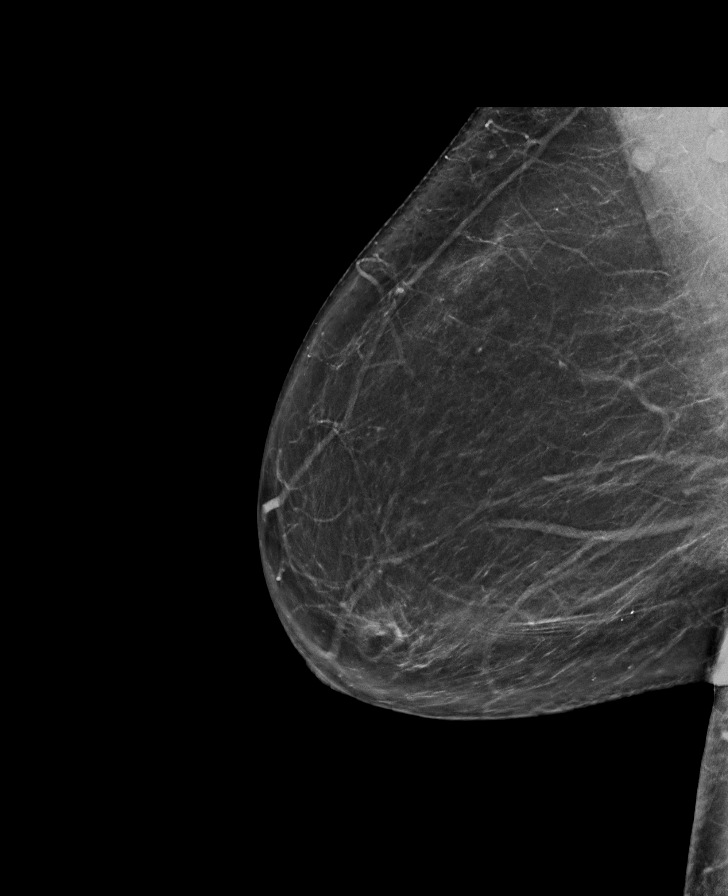

[L MLO synth-2D]
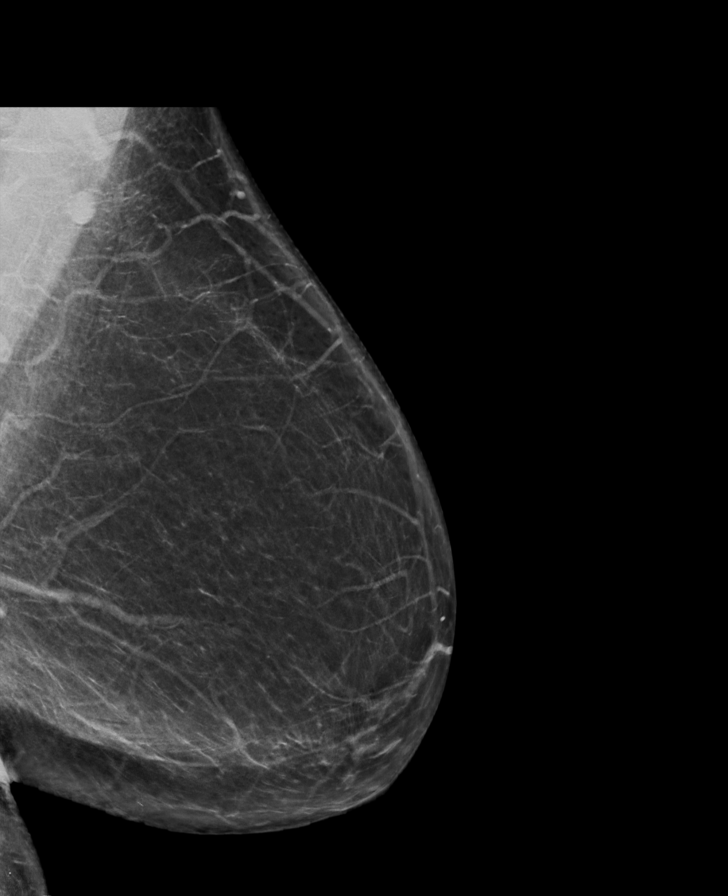

[L CC synth-2D]
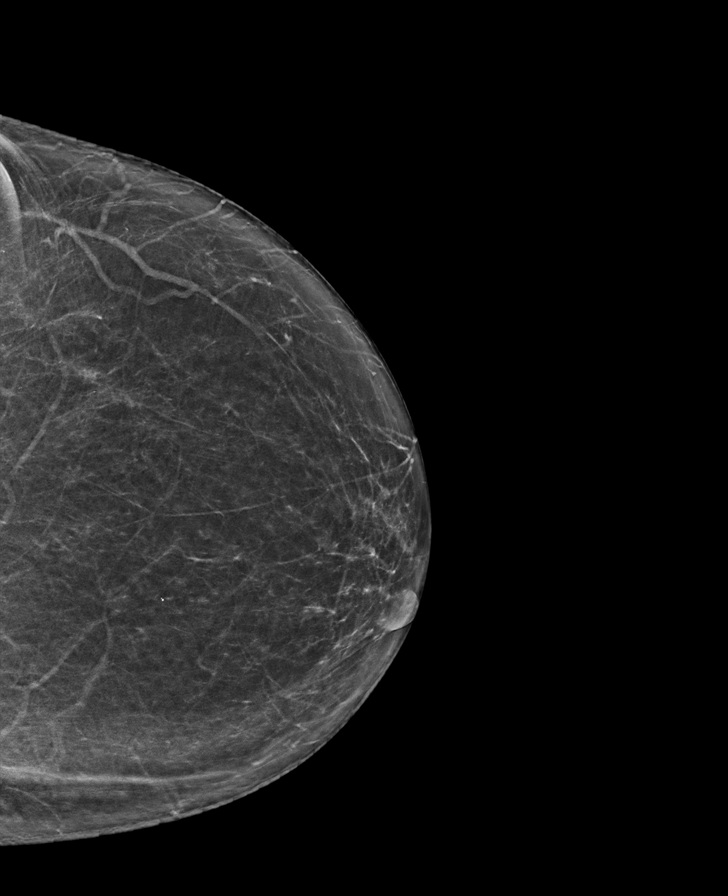

[R CC synth-2D]
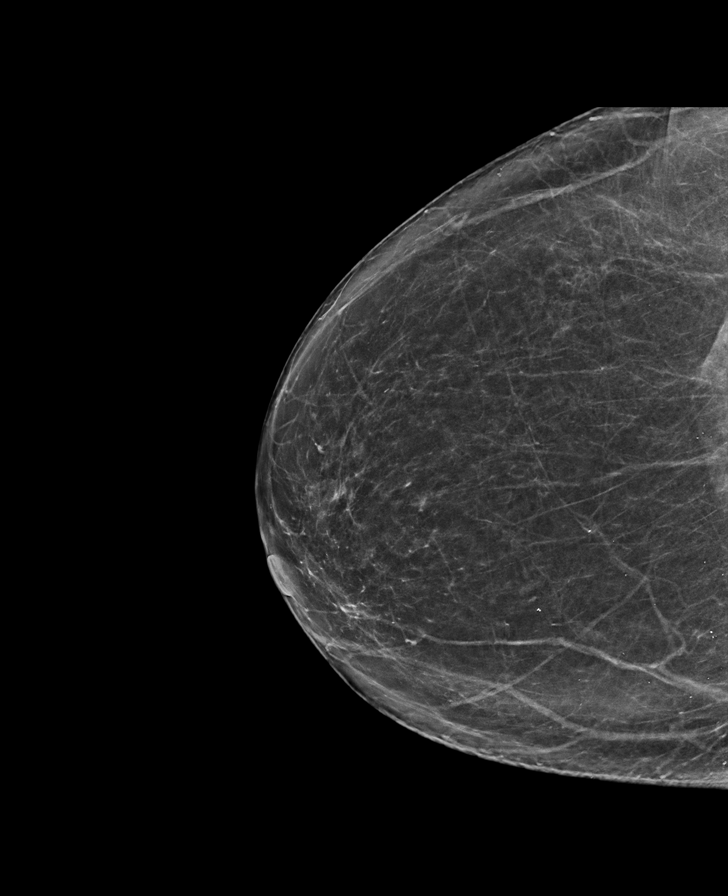

[R CC tomo · tomo slice 37/73.0]
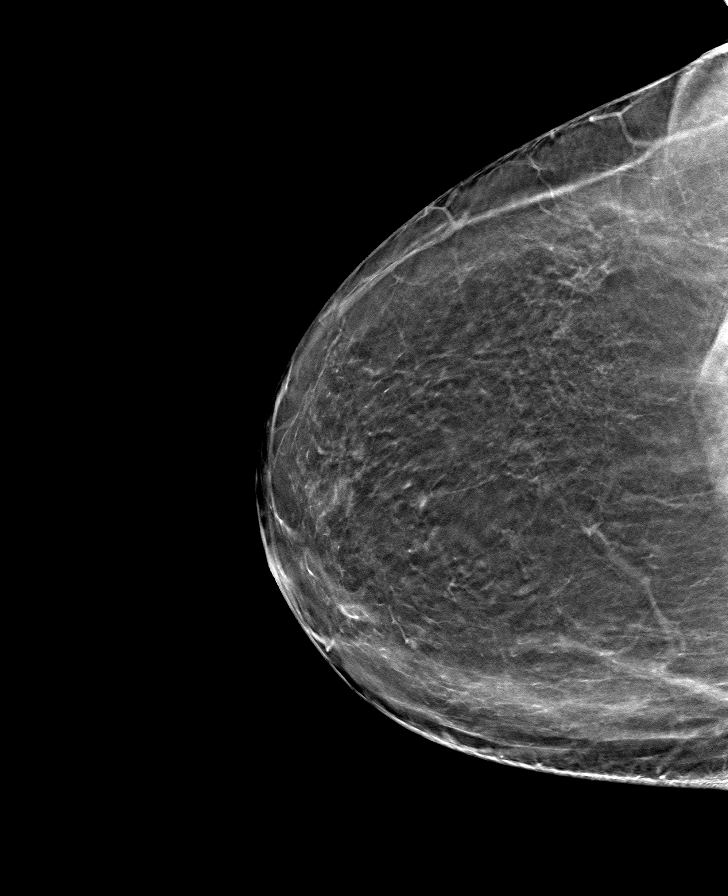

[L MLO tomo · tomo slice 42/83.0]
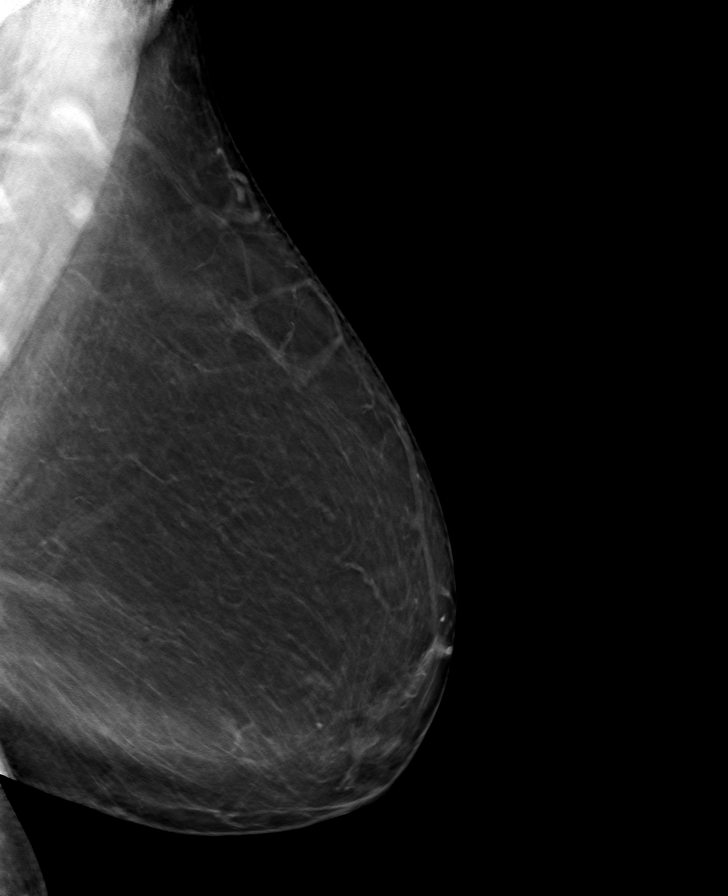

[R MLO tomo · tomo slice 42/83.0]
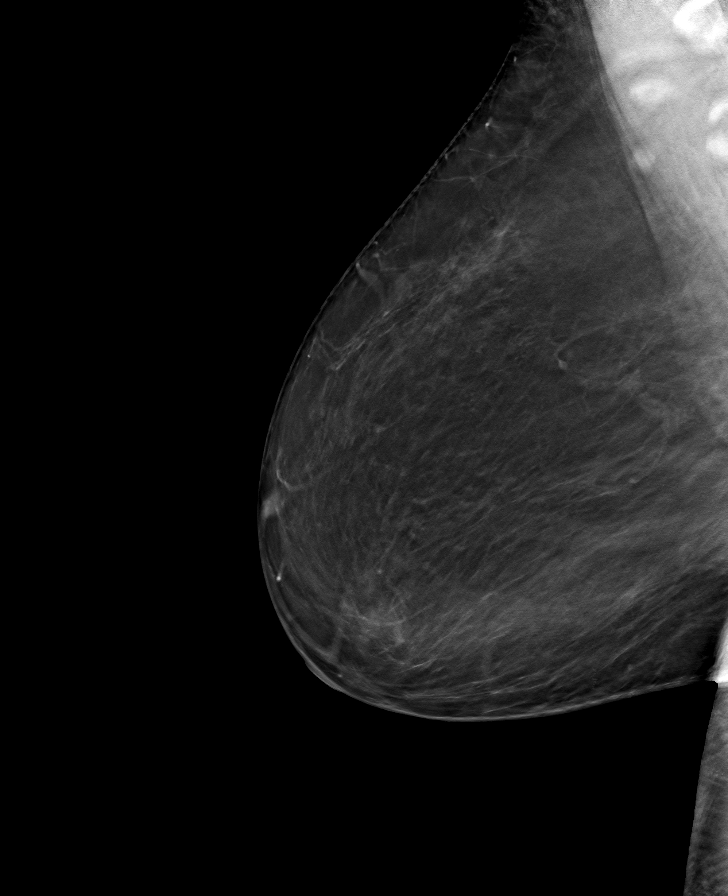

[L CC tomo · tomo slice 36/71.0]
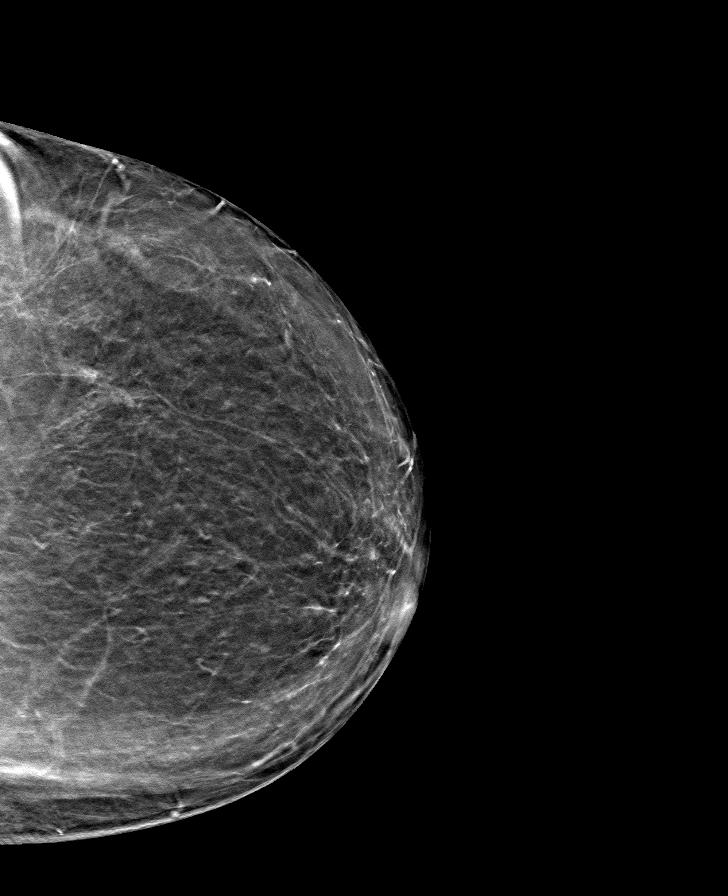

[8 of 24 positions shown; findings below may reference images not displayed]

ACR Breast Density Category b: There are scattered areas of
fibroglandular density.
FINDINGS: There are no findings suspicious for malignancy.
IMPRESSION: No mammographic evidence of malignancy. A result letter of this
screening mammogram will be mailed directly to the patient.

RECOMMENDATION:
Screening mammogram in one year. (Code:51-O-LD2)

BI-RADS CATEGORY  1: Negative.

## 2022-04-29 DIAGNOSIS — J9610 Chronic respiratory failure, unspecified whether with hypoxia or hypercapnia: Secondary | ICD-10-CM | POA: Diagnosis not present

## 2022-04-29 DIAGNOSIS — J9601 Acute respiratory failure with hypoxia: Secondary | ICD-10-CM | POA: Diagnosis not present

## 2022-04-29 DIAGNOSIS — I2699 Other pulmonary embolism without acute cor pulmonale: Secondary | ICD-10-CM | POA: Diagnosis not present

## 2022-04-29 DIAGNOSIS — J984 Other disorders of lung: Secondary | ICD-10-CM | POA: Diagnosis not present

## 2022-04-30 ENCOUNTER — Ambulatory Visit: Payer: 59 | Admitting: Pulmonary Disease

## 2022-05-07 ENCOUNTER — Encounter: Payer: Self-pay | Admitting: Pulmonary Disease

## 2022-05-07 ENCOUNTER — Ambulatory Visit: Payer: 59 | Admitting: Pulmonary Disease

## 2022-05-07 VITALS — BP 124/70 | HR 92 | Ht 68.0 in | Wt 219.0 lb

## 2022-05-07 DIAGNOSIS — R0602 Shortness of breath: Secondary | ICD-10-CM

## 2022-05-07 NOTE — Progress Notes (Addendum)
Christina Vincent    [id number]    06-26-1960  Primary Care Physician:Fanta, Wayland Salinas, MD  Referring Physician: Benetta Spar, MD [address] [place],  [place] [address]  Chief complaint:   In for follow-up visit for chronic respiratory failure  HPI:  62 year old lady with history of diabetes, obesity, hypertension, hyperlipidemia  Prolonged hospitalization in December 2021 for COVID, bilateral PEs, severe anemia, MRSA pneumonia, severe anxiety post-COVID  Has been doing relatively well  She does still use oxygen supplementation as needed  Sometimes saturations drop to about 86% off oxygen but usually tries to do a lot of activities nowadays without oxygen supplementation  She is very diligent with checking oxygen levels  Continues to use a trilogy ventilator at night for Hypercapnic respiratory failure  She is continuing to improve  Medications for anxiety  Outpatient Encounter Medications as of 05/07/2022  Medication Sig   albuterol (PROVENTIL) (2.5 MG/3ML) 0.083% nebulizer solution Take 3 mLs (2.5 mg total) by nebulization every 4 (four) hours as needed for wheezing or shortness of breath.   albuterol (VENTOLIN HFA) 108 (90 Base) MCG/ACT inhaler Inhale 2 puffs into the lungs every 6 (six) hours as needed for wheezing or shortness of breath.   atorvastatin (LIPITOR) 10 MG tablet Take 10 mg by mouth daily.   benzonatate (TESSALON) 200 MG capsule Take 1 capsule by mouth three times daily as needed for cough   budesonide (PULMICORT) 0.5 MG/2ML nebulizer solution Take 2 mLs (0.5 mg total) by nebulization 2 (two) times daily.   chlorhexidine (PERIDEX) 0.12 % solution 15 mLs by Mouth Rinse route 2 (two) times daily.   D3-50 1.25 MG (50000 UT) capsule Take 50,000 Units by mouth every 30 (thirty) days.   dextromethorphan-guaiFENesin (MUCINEX DM) 30-600 MG 12hr tablet Take 1 tablet by mouth 2 (two) times daily.   gabapentin (NEURONTIN) 100 MG  capsule Take 1 capsule (100 mg total) by mouth 2 (two) times daily.   guaiFENesin-codeine 100-10 MG/5ML syrup Take 10 mLs by mouth 3 (three) times daily as needed.   hydrocortisone (ANUSOL-HC) 2.5 % rectal cream Place rectally 2 (two) times daily as needed for hemorrhoids or anal itching.   LAGEVRIO 200 MG CAPS capsule SMARTSIG:4 Capsule(s) By Mouth Every 12 Hours   lip balm (CARMEX) ointment Apply topically as needed for lip care.   losartan (COZAAR) 50 MG tablet Take 50 mg by mouth daily.   magic mouthwash SOLN Take 15 mLs by mouth 3 (three) times daily as needed for mouth pain.   metFORMIN (GLUCOPHAGE) 850 MG tablet Take 850 mg by mouth 2 (two) times daily.   mirtazapine (REMERON) 15 MG tablet Take 1 tablet (15 mg total) by mouth at bedtime.   omeprazole (PRILOSEC) 20 MG capsule Take 20 mg by mouth daily.   oseltamivir (TAMIFLU) 75 MG capsule Take 1 capsule (75 mg total) by mouth every 12 (twelve) hours.   polyethylene glycol (MIRALAX / GLYCOLAX) 17 g packet Take 17 g by mouth daily as needed for moderate constipation.   potassium chloride (KLOR-CON) 10 MEQ tablet Take 1 tablet (10 mEq total) by mouth daily.   promethazine-dextromethorphan (PROMETHAZINE-DM) 6.25-15 MG/5ML syrup Take 5 mLs by mouth 4 (four) times daily as needed.   QUEtiapine (SEROQUEL) 25 MG tablet Take 1 tablet (25 mg total) by mouth 2 (two) times daily.   senna-docusate (SENOKOT-S) 8.6-50 MG tablet Take 2 tablets by mouth 2 (two) times daily.   traZODone (DESYREL) 50  MG tablet Take 1 tablet (50 mg total) by mouth at bedtime as needed for sleep.   No facility-administered encounter medications on file as of 05/07/2022.    Allergies as of 05/07/2022   (No Known Allergies)    Past Medical History:  Diagnosis Date   Depression    Dyslipidemia     Past Surgical History:  Procedure Laterality Date   COLONOSCOPY     1990's; hemorrhoids    Family History  Problem Relation Age of Onset   Alcohol abuse Father     Colon cancer Neg Hx     Social History   Socioeconomic History   Marital status: Married    Spouse name: Not on file   Number of children: Not on file   Years of education: Not on file   Highest education level: Not on file  Occupational History   Not on file  Tobacco Use   Smoking status: Former    Packs/day: 1.00    Years: 8.00    Additional pack years: 0.00    Total pack years: 8.00    Types: Cigarettes    Quit date: 03/24/1986    Years since quitting: 36.1   Smokeless tobacco: Never   Tobacco comments:    quit 1988  Vaping Use   Vaping Use: Never used  Substance and Sexual Activity   Alcohol use: Yes    Comment: occ glass of wine   Drug use: Never   Sexual activity: Not on file  Other Topics Concern   Not on file  Social History Narrative   Not on file   Social Determinants of Health   Financial Resource Strain: Not on file  Food Insecurity: Not on file  Transportation Needs: Not on file  Physical Activity: Not on file  Stress: Not on file  Social Connections: Not on file  Intimate Partner Violence: Not At Risk (04/18/2020)   Humiliation, Afraid, Rape, and Kick questionnaire    Fear of Current or Ex-Partner: No    Emotionally Abused: No    Physically Abused: No    Sexually Abused: No    Review of Systems  Constitutional:  Positive for fatigue.  Respiratory:  Positive for shortness of breath.   Cardiovascular:  Negative for palpitations.  Psychiatric/Behavioral:  Positive for sleep disturbance.     Vitals:   05/07/22 1535  BP: 124/70  Pulse: 92  SpO2: 94%      Physical Exam Constitutional:      Appearance: Normal appearance.  HENT:     Head: Normocephalic.     Mouth/Throat:     Mouth: Mucous membranes are moist.  Eyes:     General:        Right eye: No discharge.        Left eye: No discharge.     Pupils: Pupils are equal, round, and reactive to light.  Cardiovascular:     Rate and Rhythm: Normal rate and regular rhythm.     Pulses:  Normal pulses.     Heart sounds: No murmur heard.    No friction rub.  Pulmonary:     Effort: No respiratory distress.     Breath sounds: No stridor. No wheezing or rhonchi.  Musculoskeletal:     Cervical back: No rigidity or tenderness.  Neurological:     Mental Status: She is alert.  Psychiatric:        Mood and Affect: Mood normal.   Data Reviewed: CT chest from February 12, 2020 reviewed  by myself CT scan from 03/14/2020 reviewed by myself Echocardiogram 03/04/2020 with diastolic dysfunction  Overnight oximetry was performed on supplemental oxygen and vent-minimal desaturations noted  Assessment:   .  Hypercapnic respiratory failure -Continue oxygen supplementation during the day as needed -Oxygen supplementation to maintain saturations above 90% -Continue trilogy ventilator at night  .  Severe deconditioning -She continues to improve -Encouraged to perform graded exercises as tolerated  .  History of pulmonary embolism -Continues on anticoagulation  Obstructive lung disease -Continue bronchodilator treatments  Plan/Recommendations:  Graded exercises as tolerated  Continue trilogy ventilator at night  Follow-up in 3 months  Chest x-ray on next visit  If significant shortness of breath, echocardiogram can be considered at some point  Virl Diamond, MD [place] [facility] Pager: [phone]   CC: Benetta Spar*  Addendum: Need for nebulizer -Poor inhaler technique -Requires bronchodilator treatments for optimal management of the chronic respiratory failure, obstructive lung disease Requires a nebulizer for ongoing bronchodilator therapy with albuterol and Pulmicort  Virl Diamond, MD [place] [facility] Pager: See Loretha Stapler

## 2022-05-07 NOTE — Patient Instructions (Signed)
Continue using the trilogy at night  Continue oxygen supplementation as needed  Graded exercises as tolerated -Exercise daily if possible  Call us with significant concerns  Chest x-ray on day of next visit in 3 months

## 2022-05-30 DIAGNOSIS — I2699 Other pulmonary embolism without acute cor pulmonale: Secondary | ICD-10-CM | POA: Diagnosis not present

## 2022-05-30 DIAGNOSIS — J9610 Chronic respiratory failure, unspecified whether with hypoxia or hypercapnia: Secondary | ICD-10-CM | POA: Diagnosis not present

## 2022-05-30 DIAGNOSIS — J984 Other disorders of lung: Secondary | ICD-10-CM | POA: Diagnosis not present

## 2022-05-30 DIAGNOSIS — J9601 Acute respiratory failure with hypoxia: Secondary | ICD-10-CM | POA: Diagnosis not present

## 2022-06-02 DIAGNOSIS — J9610 Chronic respiratory failure, unspecified whether with hypoxia or hypercapnia: Secondary | ICD-10-CM | POA: Diagnosis not present

## 2022-06-02 DIAGNOSIS — I700 Atherosclerosis of aorta: Secondary | ICD-10-CM | POA: Diagnosis not present

## 2022-06-02 DIAGNOSIS — I10 Essential (primary) hypertension: Secondary | ICD-10-CM | POA: Diagnosis not present

## 2022-06-02 DIAGNOSIS — E118 Type 2 diabetes mellitus with unspecified complications: Secondary | ICD-10-CM | POA: Diagnosis not present

## 2022-06-29 DIAGNOSIS — I2699 Other pulmonary embolism without acute cor pulmonale: Secondary | ICD-10-CM | POA: Diagnosis not present

## 2022-06-29 DIAGNOSIS — J9610 Chronic respiratory failure, unspecified whether with hypoxia or hypercapnia: Secondary | ICD-10-CM | POA: Diagnosis not present

## 2022-06-29 DIAGNOSIS — J9601 Acute respiratory failure with hypoxia: Secondary | ICD-10-CM | POA: Diagnosis not present

## 2022-06-29 DIAGNOSIS — J984 Other disorders of lung: Secondary | ICD-10-CM | POA: Diagnosis not present

## 2022-07-30 DIAGNOSIS — I2699 Other pulmonary embolism without acute cor pulmonale: Secondary | ICD-10-CM | POA: Diagnosis not present

## 2022-07-30 DIAGNOSIS — J984 Other disorders of lung: Secondary | ICD-10-CM | POA: Diagnosis not present

## 2022-07-30 DIAGNOSIS — J9610 Chronic respiratory failure, unspecified whether with hypoxia or hypercapnia: Secondary | ICD-10-CM | POA: Diagnosis not present

## 2022-07-30 DIAGNOSIS — J9601 Acute respiratory failure with hypoxia: Secondary | ICD-10-CM | POA: Diagnosis not present

## 2022-08-29 DIAGNOSIS — I2699 Other pulmonary embolism without acute cor pulmonale: Secondary | ICD-10-CM | POA: Diagnosis not present

## 2022-08-29 DIAGNOSIS — J9610 Chronic respiratory failure, unspecified whether with hypoxia or hypercapnia: Secondary | ICD-10-CM | POA: Diagnosis not present

## 2022-08-29 DIAGNOSIS — J984 Other disorders of lung: Secondary | ICD-10-CM | POA: Diagnosis not present

## 2022-08-29 DIAGNOSIS — J9601 Acute respiratory failure with hypoxia: Secondary | ICD-10-CM | POA: Diagnosis not present

## 2022-09-20 ENCOUNTER — Telehealth: Payer: Self-pay | Admitting: Pulmonary Disease

## 2022-09-20 DIAGNOSIS — J9611 Chronic respiratory failure with hypoxia: Secondary | ICD-10-CM

## 2022-09-20 NOTE — Telephone Encounter (Signed)
Patient came in person after going to [facility] regarding her Nebulizer machine. Per [facility] Nebulizer is completely broken and patient will need a new machine. Patient is scheduled with [place] in Oct. Please advise.  [facility] [employer] Nebuliuzer

## 2022-09-21 NOTE — Telephone Encounter (Signed)
Dr. Val Eagle okay to place order for patient to receive new nebulizer machine. Current one is broken

## 2022-09-21 NOTE — Telephone Encounter (Signed)
Patient called about needing her Nebuliuzer still. She usually uses it 3 times a day.She can be reached at [phone] or [phone] .

## 2022-09-24 NOTE — Telephone Encounter (Signed)
PT calling about Neb machine. She was told by the nurse at the appt we'd "Rush" it. Dr. Val Eagle not in for a few weeks. Idalia Needle, can we sent to Dr. Rennis Petty the day to call this in?

## 2022-09-24 NOTE — Telephone Encounter (Signed)
Called and spoke with patient, advised that the order for the nebulizer machine has been placed and sent to the DME company.  She advised that it needed to go to [facility].  Nothing further needed.

## 2022-09-27 ENCOUNTER — Telehealth: Payer: Self-pay | Admitting: Pulmonary Disease

## 2022-09-27 NOTE — Telephone Encounter (Signed)
Christina Vincent with [facility] sent in message RE: Replacement Neb Machine  Still needing office visit notes within the last 90 days that mention the need for the neb.

## 2022-09-28 ENCOUNTER — Telehealth: Payer: Self-pay | Admitting: Pulmonary Disease

## 2022-09-28 NOTE — Telephone Encounter (Signed)
Orion Modest, Chantel Winferd Humphrey, [place]; [place], Lenon Curt, [place]; [place], Elige Radon That OV note is from March. We need a OV note that has been within the last 90days and it needs to mention the need for neb.   Thanks!

## 2022-09-29 DIAGNOSIS — I2699 Other pulmonary embolism without acute cor pulmonale: Secondary | ICD-10-CM | POA: Diagnosis not present

## 2022-09-29 DIAGNOSIS — J9601 Acute respiratory failure with hypoxia: Secondary | ICD-10-CM | POA: Diagnosis not present

## 2022-09-29 DIAGNOSIS — J984 Other disorders of lung: Secondary | ICD-10-CM | POA: Diagnosis not present

## 2022-09-29 DIAGNOSIS — J9610 Chronic respiratory failure, unspecified whether with hypoxia or hypercapnia: Secondary | ICD-10-CM | POA: Diagnosis not present

## 2022-09-30 NOTE — Telephone Encounter (Signed)
This is not a [facility] request this came from her home care company

## 2022-09-30 NOTE — Telephone Encounter (Signed)
Patient needs follow up to document need for nebulizer.

## 2022-09-30 NOTE — Telephone Encounter (Signed)
Patient is calling wanting to know what to do without a nebulizer until her appointment 9/16. She's stating that she needs a replacement and she's been going to her appointments so she doesn't understand why she needs to wait. She has the prescription but no way to utilize it without a machine. She is currently on oxygen and using her asthma gun.

## 2022-10-07 NOTE — Telephone Encounter (Signed)
Attempted to reach patient regarding her concern.  No answer or voicemail.

## 2022-10-21 ENCOUNTER — Other Ambulatory Visit: Payer: Self-pay | Admitting: Pulmonary Disease

## 2022-10-25 ENCOUNTER — Encounter: Payer: Self-pay | Admitting: Pulmonary Disease

## 2022-10-25 ENCOUNTER — Ambulatory Visit: Payer: Medicare Other | Admitting: Pulmonary Disease

## 2022-10-25 ENCOUNTER — Other Ambulatory Visit: Payer: Self-pay | Admitting: Pulmonary Disease

## 2022-10-25 ENCOUNTER — Ambulatory Visit (INDEPENDENT_AMBULATORY_CARE_PROVIDER_SITE_OTHER): Payer: Medicare Other

## 2022-10-25 VITALS — BP 150/70 | HR 100 | Ht 68.0 in | Wt 217.2 lb

## 2022-10-25 DIAGNOSIS — J9611 Chronic respiratory failure with hypoxia: Secondary | ICD-10-CM

## 2022-10-25 DIAGNOSIS — J9612 Chronic respiratory failure with hypercapnia: Secondary | ICD-10-CM | POA: Diagnosis not present

## 2022-10-25 DIAGNOSIS — R918 Other nonspecific abnormal finding of lung field: Secondary | ICD-10-CM | POA: Diagnosis not present

## 2022-10-25 DIAGNOSIS — R0602 Shortness of breath: Secondary | ICD-10-CM | POA: Diagnosis not present

## 2022-10-25 NOTE — Progress Notes (Signed)
Christina Vincent    [id number]    19-Mar-1960  Primary Care Physician:Fanta, Wayland Salinas, MD  Referring Physician: Benetta Spar, MD [address] [place],  [place] [address]  Chief complaint:   In for follow-up visit for chronic respiratory failure  HPI:  62 year old lady with history of diabetes, obesity, hypertension, hyperlipidemia  Prolonged hospitalization in December 2021 for COVID, bilateral PEs, severe anemia, MRSA pneumonia, severe anxiety post-COVID  Has been doing relatively well  Lost a nebulizer recently when traveling, unfortunately she was unable to get it replaced a good time Stated she called the office and nobody could really help, PCP was able to get a new nebulizer  Rarely needs oxygen during the day Saturations still do drop with significant exertion  She does still use oxygen supplementation as needed  Sometimes saturations drop to about 86% off oxygen but usually tries to do a lot of activities nowadays without oxygen supplementation  She is very diligent with checking oxygen levels  Continues to use a trilogy ventilator at night for Hypercapnic respiratory failure  She is continuing to improve  Medications for anxiety  Outpatient Encounter Medications as of 10/25/2022  Medication Sig   albuterol (PROVENTIL) (2.5 MG/3ML) 0.083% nebulizer solution Take 3 mLs (2.5 mg total) by nebulization every 4 (four) hours as needed for wheezing or shortness of breath.   albuterol (VENTOLIN HFA) 108 (90 Base) MCG/ACT inhaler INHALE 2 PUFFS BY MOUTH EVERY 6 HOURS AS NEEDED FOR WHEEZING OR SHORTNESS OF BREATH   atorvastatin (LIPITOR) 10 MG tablet Take 10 mg by mouth daily.   benzonatate (TESSALON) 200 MG capsule Take 1 capsule by mouth three times daily as needed for cough   budesonide (PULMICORT) 0.5 MG/2ML nebulizer solution Take 2 mLs (0.5 mg total) by nebulization 2 (two) times daily.   chlorhexidine (PERIDEX) 0.12 % solution 15 mLs  by Mouth Rinse route 2 (two) times daily.   D3-50 1.25 MG (50000 UT) capsule Take 50,000 Units by mouth every 30 (thirty) days.   dextromethorphan-guaiFENesin (MUCINEX DM) 30-600 MG 12hr tablet Take 1 tablet by mouth 2 (two) times daily.   gabapentin (NEURONTIN) 100 MG capsule Take 1 capsule (100 mg total) by mouth 2 (two) times daily.   guaiFENesin-codeine 100-10 MG/5ML syrup Take 10 mLs by mouth 3 (three) times daily as needed.   hydrocortisone (ANUSOL-HC) 2.5 % rectal cream Place rectally 2 (two) times daily as needed for hemorrhoids or anal itching.   LAGEVRIO 200 MG CAPS capsule SMARTSIG:4 Capsule(s) By Mouth Every 12 Hours   lip balm (CARMEX) ointment Apply topically as needed for lip care.   losartan (COZAAR) 50 MG tablet Take 50 mg by mouth daily.   magic mouthwash SOLN Take 15 mLs by mouth 3 (three) times daily as needed for mouth pain.   metFORMIN (GLUCOPHAGE) 850 MG tablet Take 850 mg by mouth 2 (two) times daily.   mirtazapine (REMERON) 15 MG tablet Take 1 tablet (15 mg total) by mouth at bedtime.   omeprazole (PRILOSEC) 20 MG capsule Take 20 mg by mouth daily.   oseltamivir (TAMIFLU) 75 MG capsule Take 1 capsule (75 mg total) by mouth every 12 (twelve) hours.   polyethylene glycol (MIRALAX / GLYCOLAX) 17 g packet Take 17 g by mouth daily as needed for moderate constipation.   potassium chloride (KLOR-CON) 10 MEQ tablet Take 1 tablet (10 mEq total) by mouth daily.   promethazine-dextromethorphan (PROMETHAZINE-DM) 6.25-15 MG/5ML syrup Take 5 mLs  by mouth 4 (four) times daily as needed.   QUEtiapine (SEROQUEL) 25 MG tablet Take 1 tablet (25 mg total) by mouth 2 (two) times daily.   senna-docusate (SENOKOT-S) 8.6-50 MG tablet Take 2 tablets by mouth 2 (two) times daily.   traZODone (DESYREL) 50 MG tablet Take 1 tablet (50 mg total) by mouth at bedtime as needed for sleep.   No facility-administered encounter medications on file as of 10/25/2022.    Allergies as of 10/25/2022   (No  Known Allergies)    Past Medical History:  Diagnosis Date   Depression    Dyslipidemia     Past Surgical History:  Procedure Laterality Date   COLONOSCOPY     1990's; hemorrhoids    Family History  Problem Relation Age of Onset   Alcohol abuse Father    Colon cancer Neg Hx     Social History   Socioeconomic History   Marital status: Married    Spouse name: Not on file   Number of children: Not on file   Years of education: Not on file   Highest education level: Not on file  Occupational History   Not on file  Tobacco Use   Smoking status: Former    Current packs/day: 0.00    Average packs/day: 1 pack/day for 8.0 years (8.0 ttl pk-yrs)    Types: Cigarettes    Start date: 03/24/1978    Quit date: 03/24/1986    Years since quitting: 36.6   Smokeless tobacco: Never   Tobacco comments:    quit 1988  Vaping Use   Vaping status: Never Used  Substance and Sexual Activity   Alcohol use: Yes    Comment: occ glass of wine   Drug use: Never   Sexual activity: Not on file  Other Topics Concern   Not on file  Social History Narrative   Not on file   Social Determinants of Health   Financial Resource Strain: Not on file  Food Insecurity: Not on file  Transportation Needs: Not on file  Physical Activity: Not on file  Stress: Not on file  Social Connections: Not on file  Intimate Partner Violence: Not At Risk (04/18/2020)   Humiliation, Afraid, Rape, and Kick questionnaire    Fear of Current or Ex-Partner: No    Emotionally Abused: No    Physically Abused: No    Sexually Abused: No    Review of Systems  Constitutional:  Positive for fatigue.  Respiratory:  Positive for shortness of breath.   Cardiovascular:  Negative for palpitations.  Psychiatric/Behavioral:  Positive for sleep disturbance.     Vitals:   10/25/22 1034  BP: (!) 150/70  Pulse: 100  SpO2: 96%      Physical Exam Constitutional:      Appearance: Normal appearance.  HENT:     Head:  Normocephalic.     Mouth/Throat:     Mouth: Mucous membranes are moist.  Eyes:     General:        Right eye: No discharge.        Left eye: No discharge.     Pupils: Pupils are equal, round, and reactive to light.  Cardiovascular:     Rate and Rhythm: Normal rate and regular rhythm.     Pulses: Normal pulses.     Heart sounds: No murmur heard.    No friction rub.  Pulmonary:     Effort: No respiratory distress.     Breath sounds: No stridor. No wheezing  or rhonchi.  Musculoskeletal:     Cervical back: No rigidity or tenderness.  Neurological:     Mental Status: She is alert.     Deep Tendon Reflexes: Reflexes normal.  Psychiatric:        Mood and Affect: Mood normal.    Data Reviewed: CT chest from February 12, 2020 reviewed by myself CT scan from 03/14/2020 reviewed by myself Echocardiogram 03/04/2020 with diastolic dysfunction  Overnight oximetry was performed on supplemental oxygen and vent-minimal desaturations noted  She experienced chest x-ray today shows pulmonary interstitial markings, slightly improved compared to previous  Assessment:   .  Hypercapnic respiratory failure -Continue trilogy ventilator at night -Oxygen supplementation during the day  .  Severe deconditioning -She continues to improve -Encouraged graded exercise as tolerated  .  History of pulmonary embolism -Continue anticoagulation  Obstructive lung disease -Continue bronchodilator treatments  Plan/Recommendations:  Graded exercises as tolerated  Continue trilogy ventilator at night  Encouraged to call us with significant concerns  Follow-up in 3 months  If significant shortness of breath, echocardiogram can be considered at some point  Virl Diamond, MD [place] [facility]M Pager: [phone]   CC: Benetta Spar*  Addendum: Need for nebulizer -Poor inhaler technique -Requires bronchodilator treatments for optimal management of the chronic respiratory failure,  obstructive lung disease Requires a nebulizer for ongoing bronchodilator therapy with albuterol and Pulmicort  Virl Diamond, MD [place] [facility]M Pager: See Loretha Stapler

## 2022-10-25 NOTE — Patient Instructions (Addendum)
I will see you back in about 4 months  Continue using your nebulizer regularly  Continue using your BiPAP at night  Call us with significant concerns  Continue to try and stay as active as you can be, regular exercises always helps  Your chest x-ray today looks good, improved from previous, still has some haziness from the scarring in your lungs

## 2022-10-28 ENCOUNTER — Telehealth: Payer: Self-pay | Admitting: Pulmonary Disease

## 2022-10-28 DIAGNOSIS — J9611 Chronic respiratory failure with hypoxia: Secondary | ICD-10-CM

## 2022-10-28 NOTE — Telephone Encounter (Signed)
Pt wants order for Oxygen sent to [facility]

## 2022-11-01 DIAGNOSIS — Z6833 Body mass index (BMI) 33.0-33.9, adult: Secondary | ICD-10-CM | POA: Diagnosis not present

## 2022-11-01 DIAGNOSIS — Z8616 Personal history of COVID-19: Secondary | ICD-10-CM | POA: Diagnosis not present

## 2022-11-01 DIAGNOSIS — I10 Essential (primary) hypertension: Secondary | ICD-10-CM | POA: Diagnosis not present

## 2022-11-01 DIAGNOSIS — Z0001 Encounter for general adult medical examination with abnormal findings: Secondary | ICD-10-CM | POA: Diagnosis not present

## 2022-11-01 DIAGNOSIS — E118 Type 2 diabetes mellitus with unspecified complications: Secondary | ICD-10-CM | POA: Diagnosis not present

## 2022-11-10 NOTE — Telephone Encounter (Signed)
Called patient.  Patient states [facility] needs updated orders for patient to continue her oxygen at home and when ambulating outside home.  Patient uses trilogy ventilator at night.  Per OV note from Dr. Wynona Neat on 10/25/2022:  Assessment:    .  Hypercapnic respiratory failure -Continue trilogy ventilator at night -Oxygen supplementation during the day  Route (nasal cannula OR mask): Sims Liter Flow: 1lpm Frequency (continuous with stationary and portable oxygen unit needed OR only at night): portable o2 1lpm with exertion Length of Need: Lifetime Was this based off an ONO?: no DME: [facility]  Oxygen Conserving Device (Yes or No): yes Type of Oxygen: Gas If new O2 start, please evaluate and titrate for best fit POC or portable O2 system.  Will send in updated new order to [facility] to continue supplemental oxygen for patient.

## 2022-11-12 ENCOUNTER — Telehealth: Payer: Self-pay | Admitting: Pulmonary Disease

## 2022-11-12 NOTE — Telephone Encounter (Signed)
Sent new order to [facility] and received this message back from [place] with [facility] see below This was on patients account 11/03/2022   "called pt for needed appt it went to a voicemail box that is full . Patient needs an appt after 07/10/22 for use and benefit notes for the vent they will need to show that a BiPAP will not work for her and she would be at a risk of harm to be without. We also need a copy of ABG or PFT testing

## 2022-11-15 ENCOUNTER — Other Ambulatory Visit: Payer: Self-pay | Admitting: Pulmonary Disease

## 2022-11-15 DIAGNOSIS — J9611 Chronic respiratory failure with hypoxia: Secondary | ICD-10-CM

## 2022-11-15 NOTE — Telephone Encounter (Signed)
Order placed for PFT and ABG  Requested by DME for the trilogy ventilator

## 2022-11-16 NOTE — Telephone Encounter (Signed)
[employer] notified that PFT/ABG ordered.

## 2022-12-03 ENCOUNTER — Ambulatory Visit: Payer: 59 | Admitting: Pulmonary Disease

## 2023-01-27 ENCOUNTER — Encounter (INDEPENDENT_AMBULATORY_CARE_PROVIDER_SITE_OTHER): Payer: Self-pay | Admitting: *Deleted

## 2023-04-22 ENCOUNTER — Telehealth: Payer: Self-pay

## 2023-04-22 NOTE — Telephone Encounter (Signed)
 Called pt to see if they were using their bipap machine and they stated they were and asked if they could bring it pending call to DME [facility] and will see it in my email if there is one will follow up on Monday 04/25/2023

## 2023-04-25 ENCOUNTER — Encounter: Payer: Self-pay | Admitting: Pulmonary Disease

## 2023-04-25 ENCOUNTER — Ambulatory Visit: Payer: Medicare Other | Admitting: Pulmonary Disease

## 2023-04-25 VITALS — BP 146/87 | HR 99 | Ht 68.0 in | Wt 214.4 lb

## 2023-04-25 DIAGNOSIS — Z87891 Personal history of nicotine dependence: Secondary | ICD-10-CM

## 2023-04-25 DIAGNOSIS — J9612 Chronic respiratory failure with hypercapnia: Secondary | ICD-10-CM

## 2023-04-25 DIAGNOSIS — R0602 Shortness of breath: Secondary | ICD-10-CM

## 2023-04-25 DIAGNOSIS — J9611 Chronic respiratory failure with hypoxia: Secondary | ICD-10-CM | POA: Diagnosis not present

## 2023-04-25 NOTE — Patient Instructions (Signed)
 Breathing study can be done on the day of the next visit  Will see you back in about 3 months  Call with significant concerns

## 2023-04-25 NOTE — Addendum Note (Signed)
 Addended by: Virl Diamond A on: 04/25/2023 03:02 PM   Modules accepted: Level of Service

## 2023-04-25 NOTE — Progress Notes (Addendum)
 Christina Vincent    [id number]    [birth year]  Primary Care Physician:Fanta, Wayland Salinas, MD  Referring Physician: Benetta Spar, MD [address] [place],  [place] [address]  Chief complaint:   In for follow-up visit for chronic respiratory failure  HPI:  63 year old lady with history of diabetes, obesity, hypertension, hyperlipidemia  History of prolonged hospitalization December 2021 following COVID, bilateral PEs, MRSA pneumonia, she does have severe anxiety-post the COVID infection  She continues to do relatively well  Uses oxygen as needed during the day  Does use albuterol as needed  Currently helping to take care of spouse who was recently diagnosed with appendiceal/rectal cancer  A little worsening of anxiety which she is managing well  Uses a trilogy ventilator at night for hypercapnic respiratory failure  She is continuing to improve  Medications for anxiety-Seroquel  Outpatient Encounter Medications as of 04/25/2023  Medication Sig   albuterol (PROVENTIL) (2.5 MG/3ML) 0.083% nebulizer solution Take 3 mLs (2.5 mg total) by nebulization every 4 (four) hours as needed for wheezing or shortness of breath.   albuterol (VENTOLIN HFA) 108 (90 Base) MCG/ACT inhaler INHALE 2 PUFFS BY MOUTH EVERY 6 HOURS AS NEEDED FOR WHEEZING OR SHORTNESS OF BREATH   atorvastatin (LIPITOR) 10 MG tablet Take 10 mg by mouth daily.   benzonatate (TESSALON) 200 MG capsule Take 1 capsule by mouth three times daily as needed for cough   budesonide (PULMICORT) 0.5 MG/2ML nebulizer solution Take 2 mLs (0.5 mg total) by nebulization 2 (two) times daily.   chlorhexidine (PERIDEX) 0.12 % solution 15 mLs by Mouth Rinse route 2 (two) times daily.   D3-50 1.25 MG (50000 UT) capsule Take 50,000 Units by mouth every 30 (thirty) days.   dextromethorphan-guaiFENesin (MUCINEX DM) 30-600 MG 12hr tablet Take 1 tablet by mouth 2 (two) times daily.   gabapentin (NEURONTIN) 100 MG  capsule Take 1 capsule (100 mg total) by mouth 2 (two) times daily.   guaiFENesin-codeine 100-10 MG/5ML syrup Take 10 mLs by mouth 3 (three) times daily as needed.   hydrocortisone (ANUSOL-HC) 2.5 % rectal cream Place rectally 2 (two) times daily as needed for hemorrhoids or anal itching.   LAGEVRIO 200 MG CAPS capsule SMARTSIG:4 Capsule(s) By Mouth Every 12 Hours   lip balm (CARMEX) ointment Apply topically as needed for lip care.   losartan (COZAAR) 50 MG tablet Take 50 mg by mouth daily.   magic mouthwash SOLN Take 15 mLs by mouth 3 (three) times daily as needed for mouth pain.   metFORMIN (GLUCOPHAGE) 850 MG tablet Take 850 mg by mouth 2 (two) times daily.   mirtazapine (REMERON) 15 MG tablet Take 1 tablet (15 mg total) by mouth at bedtime.   omeprazole (PRILOSEC) 20 MG capsule Take 20 mg by mouth daily.   oseltamivir (TAMIFLU) 75 MG capsule Take 1 capsule (75 mg total) by mouth every 12 (twelve) hours.   polyethylene glycol (MIRALAX / GLYCOLAX) 17 g packet Take 17 g by mouth daily as needed for moderate constipation.   potassium chloride (KLOR-CON) 10 MEQ tablet Take 1 tablet (10 mEq total) by mouth daily.   promethazine-dextromethorphan (PROMETHAZINE-DM) 6.25-15 MG/5ML syrup Take 5 mLs by mouth 4 (four) times daily as needed.   QUEtiapine (SEROQUEL) 25 MG tablet Take 1 tablet (25 mg total) by mouth 2 (two) times daily.   senna-docusate (SENOKOT-S) 8.6-50 MG tablet Take 2 tablets by mouth 2 (two) times daily.   traZODone (DESYREL)  50 MG tablet Take 1 tablet (50 mg total) by mouth at bedtime as needed for sleep.   No facility-administered encounter medications on file as of 04/25/2023.    Allergies as of 04/25/2023   (No Known Allergies)    Past Medical History:  Diagnosis Date   Depression    Dyslipidemia     Past Surgical History:  Procedure Laterality Date   COLONOSCOPY     1990's; hemorrhoids    Family History  Problem Relation Age of Onset   Alcohol abuse Father     Colon cancer Neg Hx     Social History   Socioeconomic History   Marital status: Married    Spouse name: Not on file   Number of children: Not on file   Years of education: Not on file   Highest education level: Not on file  Occupational History   Not on file  Tobacco Use   Smoking status: Former    Current packs/day: 0.00    Average packs/day: 1 pack/day for 8.0 years (8.0 ttl pk-yrs)    Types: Cigarettes    Start date: 03/24/1978    Quit date: 03/24/1986    Years since quitting: 37.1   Smokeless tobacco: Never   Tobacco comments:    quit 1988  Vaping Use   Vaping status: Never Used  Substance and Sexual Activity   Alcohol use: Yes    Comment: occ glass of wine   Drug use: Never   Sexual activity: Not on file  Other Topics Concern   Not on file  Social History Narrative   Not on file   Social Drivers of Health   Financial Resource Strain: Not on file  Food Insecurity: Not on file  Transportation Needs: Not on file  Physical Activity: Not on file  Stress: Not on file  Social Connections: Not on file  Intimate Partner Violence: Not At Risk (04/18/2020)   Humiliation, Afraid, Rape, and Kick questionnaire    Fear of Current or Ex-Partner: No    Emotionally Abused: No    Physically Abused: No    Sexually Abused: No    Review of Systems  Constitutional:  Positive for fatigue.  Respiratory:  Positive for shortness of breath.   Cardiovascular:  Negative for palpitations.  Psychiatric/Behavioral:  Positive for sleep disturbance.     There were no vitals filed for this visit.     Physical Exam Constitutional:      Appearance: Normal appearance.  HENT:     Head: Normocephalic.     Mouth/Throat:     Mouth: Mucous membranes are moist.  Eyes:     General:        Right eye: No discharge.        Left eye: No discharge.     Pupils: Pupils are equal, round, and reactive to light.  Cardiovascular:     Rate and Rhythm: Normal rate and regular rhythm.      Pulses: Normal pulses.     Heart sounds: No murmur heard.    No friction rub.  Pulmonary:     Effort: No respiratory distress.     Breath sounds: No stridor. No wheezing or rhonchi.  Musculoskeletal:     Cervical back: No rigidity or tenderness.  Neurological:     Mental Status: She is alert.     Deep Tendon Reflexes: Reflexes normal.  Psychiatric:        Mood and Affect: Mood normal.    Data Reviewed: CT chest  from February 12, 2020 reviewed by myself CT scan from 03/14/2020 reviewed by myself Echocardiogram 03/04/2020 with diastolic dysfunction  Overnight oximetry was performed on supplemental oxygen and vent-minimal desaturations noted  Compliance from trilogy ventilator use reviewed showing average use of 4 hours 50 minutes, 90% of nights, occasional leaks, Appears to be well-tolerated with good compliance  Assessment:   History of hypercapnic respiratory failure continue trilogy ventilator at night -Oxygen supplementation as needed during the day -Compliance appears to be optimal  Deconditioning -She continues to improve -Trying to stay active  History of pulmonary embolism -Chronic anticoagulation  History of obstructive lung disease -Continue bronchodilator treatments   Plan/Recommendations:  Continue trilogy ventilator at night  Encouraged to give [place] a call with any significant concerns  Follow-up about 3 months from here  Will get a breathing study on the day of next visit   Encouraged to call us with significant concerns  Follow-up in 3 months  Virl Diamond, MD [facility] Pager: See Loretha Stapler

## 2023-07-20 ENCOUNTER — Ambulatory Visit: Admitting: Pulmonary Disease

## 2023-07-20 ENCOUNTER — Encounter: Payer: Self-pay | Admitting: Pulmonary Disease

## 2023-07-20 VITALS — BP 146/87 | HR 97 | Ht 68.0 in | Wt 218.0 lb

## 2023-07-20 DIAGNOSIS — J9611 Chronic respiratory failure with hypoxia: Secondary | ICD-10-CM | POA: Diagnosis not present

## 2023-07-20 DIAGNOSIS — R0602 Shortness of breath: Secondary | ICD-10-CM

## 2023-07-20 DIAGNOSIS — J9612 Chronic respiratory failure with hypercapnia: Secondary | ICD-10-CM

## 2023-07-20 LAB — PULMONARY FUNCTION TEST
DL/VA % pred: 112 %
DL/VA: 4.61 ml/min/mmHg/L
DLCO cor % pred: 67 %
DLCO cor: 15.56 ml/min/mmHg
DLCO unc % pred: 67 %
DLCO unc: 15.56 ml/min/mmHg
FEF 25-75 Post: 3.73 L/s
FEF 25-75 Pre: 2.48 L/s
FEF2575-%Change-Post: 50 %
FEF2575-%Pred-Post: 144 %
FEF2575-%Pred-Pre: 96 %
FEV1-%Change-Post: 3 %
FEV1-%Pred-Post: 76 %
FEV1-%Pred-Pre: 73 %
FEV1-Post: 2.26 L
FEV1-Pre: 2.18 L
FEV1FVC-%Change-Post: 3 %
FEV1FVC-%Pred-Pre: 109 %
FEV6-%Change-Post: 0 %
FEV6-%Pred-Post: 68 %
FEV6-%Pred-Pre: 69 %
FEV6-Post: 2.54 L
FEV6-Pre: 2.55 L
FEV6FVC-%Pred-Post: 103 %
FEV6FVC-%Pred-Pre: 103 %
FVC-%Change-Post: 0 %
FVC-%Pred-Post: 67 %
FVC-%Pred-Pre: 67 %
FVC-Post: 2.56 L
FVC-Pre: 2.55 L
Post FEV1/FVC ratio: 88 %
Post FEV6/FVC ratio: 100 %
Pre FEV1/FVC ratio: 85 %
Pre FEV6/FVC Ratio: 100 %
RV % pred: 228 %
RV: 4.99 L
TLC % pred: 138 %
TLC: 7.81 L

## 2023-07-20 NOTE — Progress Notes (Signed)
 Christina Vincent    [id number]    [birth year]  Primary Care Physician:Fanta, Nancee Awe, MD  Referring Physician: Wyvonna Heidelberg, MD [address] [place],  [place] [address]  Chief complaint:   Patient in for follow-up for chronic respiratory failure  HPI:  History of prolonged hospitalization December 2021 with COVID, bilateral PEs, MRSA pneumonia severe anxiety post COVID infection  No recent exacerbations  She still continues to try to exercise more Trying to get more active overall  Uses albuterol  as needed  Uses oxygen  supplementation during the day as needed  Uses a trilogy ventilator at night and uses it nightly, benefiting from its use  Continues to improve  Does have anxiety which is stable   Outpatient Encounter Medications as of 07/20/2023  Medication Sig   albuterol  (PROVENTIL ) (2.5 MG/3ML) 0.083% nebulizer solution Take 3 mLs (2.5 mg total) by nebulization every 4 (four) hours as needed for wheezing or shortness of breath.   albuterol  (VENTOLIN  HFA) 108 (90 Base) MCG/ACT inhaler INHALE 2 PUFFS BY MOUTH EVERY 6 HOURS AS NEEDED FOR WHEEZING OR SHORTNESS OF BREATH   atorvastatin (LIPITOR) 10 MG tablet Take 10 mg by mouth daily.   benzonatate  (TESSALON ) 200 MG capsule Take 1 capsule by mouth three times daily as needed for cough   budesonide  (PULMICORT ) 0.5 MG/2ML nebulizer solution Take 2 mLs (0.5 mg total) by nebulization 2 (two) times daily.   chlorhexidine  (PERIDEX ) 0.12 % solution 15 mLs by Mouth Rinse route 2 (two) times daily.   D3-50 1.25 MG (50000 UT) capsule Take 50,000 Units by mouth every 30 (thirty) days.   dextromethorphan -guaiFENesin  (MUCINEX  DM) 30-600 MG 12hr tablet Take 1 tablet by mouth 2 (two) times daily.   gabapentin  (NEURONTIN ) 100 MG capsule Take 1 capsule (100 mg total) by mouth 2 (two) times daily.   guaiFENesin -codeine  100-10 MG/5ML syrup Take 10 mLs by mouth 3 (three) times daily as needed.   hydrocortisone   (ANUSOL -HC) 2.5 % rectal cream Place rectally 2 (two) times daily as needed for hemorrhoids or anal itching.   LAGEVRIO 200 MG CAPS capsule SMARTSIG:4 Capsule(s) By Mouth Every 12 Hours   lip balm (CARMEX) ointment Apply topically as needed for lip care.   losartan  (COZAAR ) 50 MG tablet Take 50 mg by mouth daily.   magic mouthwash SOLN Take 15 mLs by mouth 3 (three) times daily as needed for mouth pain.   metFORMIN  (GLUCOPHAGE ) 850 MG tablet Take 850 mg by mouth 2 (two) times daily.   mirtazapine  (REMERON ) 15 MG tablet Take 1 tablet (15 mg total) by mouth at bedtime.   omeprazole (PRILOSEC) 20 MG capsule Take 20 mg by mouth daily.   oseltamivir  (TAMIFLU ) 75 MG capsule Take 1 capsule (75 mg total) by mouth every 12 (twelve) hours.   polyethylene glycol (MIRALAX  / GLYCOLAX ) 17 g packet Take 17 g by mouth daily as needed for moderate constipation.   potassium chloride  (KLOR-CON ) 10 MEQ tablet Take 1 tablet (10 mEq total) by mouth daily.   promethazine-dextromethorphan  (PROMETHAZINE-DM) 6.25-15 MG/5ML syrup Take 5 mLs by mouth 4 (four) times daily as needed.   QUEtiapine  (SEROQUEL ) 25 MG tablet Take 1 tablet (25 mg total) by mouth 2 (two) times daily.   senna-docusate (SENOKOT-S) 8.6-50 MG tablet Take 2 tablets by mouth 2 (two) times daily.   traZODone  (DESYREL ) 50 MG tablet Take 1 tablet (50 mg total) by mouth at bedtime as needed for sleep.   No facility-administered  encounter medications on file as of 07/20/2023.    Allergies as of 07/20/2023   (No Known Allergies)    Past Medical History:  Diagnosis Date   Depression    Dyslipidemia     Past Surgical History:  Procedure Laterality Date   COLONOSCOPY     1990's; hemorrhoids    Family History  Problem Relation Age of Onset   Alcohol abuse Father    Colon cancer Neg Hx     Social History   Socioeconomic History   Marital status: Married    Spouse name: Not on file   Number of children: Not on file   Years of education: Not  on file   Highest education level: Not on file  Occupational History   Not on file  Tobacco Use   Smoking status: Former    Current packs/day: 0.00    Average packs/day: 1 pack/day for 8.0 years (8.0 ttl pk-yrs)    Types: Cigarettes    Start date: 03/24/1978    Quit date: 03/24/1986    Years since quitting: 37.3    Passive exposure: Past   Smokeless tobacco: Never   Tobacco comments:    quit 1988  Vaping Use   Vaping status: Never Used  Substance and Sexual Activity   Alcohol use: Yes    Comment: occ glass of wine   Drug use: Never   Sexual activity: Not on file  Other Topics Concern   Not on file  Social History Narrative   Not on file   Social Drivers of Health   Financial Resource Strain: Not on file  Food Insecurity: Not on file  Transportation Needs: Not on file  Physical Activity: Not on file  Stress: Not on file  Social Connections: Not on file  Intimate Partner Violence: Not At Risk (04/18/2020)   Humiliation, Afraid, Rape, and Kick questionnaire    Fear of Current or Ex-Partner: No    Emotionally Abused: No    Physically Abused: No    Sexually Abused: No    Review of Systems  Constitutional:  Positive for fatigue.  Respiratory:  Positive for shortness of breath.   Cardiovascular:  Negative for palpitations.  Psychiatric/Behavioral:  Positive for sleep disturbance.     Vitals:   07/20/23 1607  BP: (!) 146/87  Pulse: 97  SpO2: 93%       Physical Exam Constitutional:      Appearance: Normal appearance.  HENT:     Head: Normocephalic.     Mouth/Throat:     Mouth: Mucous membranes are moist.  Eyes:     General:        Right eye: No discharge.        Left eye: No discharge.     Pupils: Pupils are equal, round, and reactive to light.  Cardiovascular:     Rate and Rhythm: Normal rate and regular rhythm.     Pulses: Normal pulses.     Heart sounds: No murmur heard.    No friction rub.  Pulmonary:     Effort: No respiratory distress.      Breath sounds: No stridor. No wheezing or rhonchi.  Musculoskeletal:     Cervical back: No rigidity or tenderness.  Neurological:     Mental Status: She is alert.     Deep Tendon Reflexes: Reflexes normal.  Psychiatric:        Mood and Affect: Mood normal.    Data Reviewed: CT chest from February 12, 2020 reviewed by  myself CT scan from 03/14/2020 reviewed by myself Echocardiogram 03/04/2020 with diastolic dysfunction  Overnight oximetry was performed on supplemental oxygen  and vent-minimal desaturations noted pulmonary function test reviewed and it shows no significant obstruction, no significant bronchodilator response, there is hyperinflation, air trapping, mild reduction in diffusing capacity  Compliance from trilogy ventilator not available at present, will follow-up with this - Has no concerns about the ventilator  Assessment:   .  History of hypercapnic respiratory failure, continues trilogy ventilator at night - Oxygen  supplementation as needed during the day - Continues to be compliant with trilogy ventilator use  Deconditioning - Continues to improve - Continues to try to stay active  History of pulmonary embolism - Continues on chronic anticoagulation  History of obstructive lung disease On bronchodilator treatments PFT shows hyperinflation  Plan/Recommendations:  Continue trilogy ventilator at night  Follow-up 6 months from here  Graded exercise as tolerated  Encouraged to call with significant concerns   Myer Artis, MD [facility] Pager: See Tilford Foley

## 2023-07-20 NOTE — Patient Instructions (Signed)
 Full PFT performed today.

## 2023-07-20 NOTE — Patient Instructions (Addendum)
 I will see you back in about 6 months  Continue your trilogy vent at night  Continue oxygen  supplementation as needed  Your breathing study does show evidence of trapping air in your lungs, this will usually manifest as shortness of breath whenever you are active  Continue your nebulizer treatments  Call us  with significant concerns

## 2023-07-20 NOTE — Progress Notes (Signed)
 Full PFT performed today.

## 2023-08-03 ENCOUNTER — Encounter (INDEPENDENT_AMBULATORY_CARE_PROVIDER_SITE_OTHER): Payer: Self-pay | Admitting: *Deleted

## 2023-10-21 ENCOUNTER — Telehealth: Payer: Self-pay

## 2023-10-21 NOTE — Telephone Encounter (Signed)
 Order has been faxed back 10/14/23

## 2023-11-24 ENCOUNTER — Telehealth: Payer: Self-pay | Admitting: *Deleted

## 2023-11-24 NOTE — Telephone Encounter (Signed)
 CRM received from [facility] for Home/V+Pro , non invasive vent.  This patient has never been seen by Madelin Stank NP.  CRM will need to be sent back for Dr. Neda to sign. Community message sent to [employer].  Nothing further needed.

## 2023-12-01 NOTE — Telephone Encounter (Signed)
 New CMN received for [facility].  Provided to Dr. Neda for signature.  Form signed by Dr. Moody and faxed to [facility].

## 2023-12-01 NOTE — Telephone Encounter (Signed)
 Received confirmation fax that it was sent successfully.  Original form placed in scan folder and copy placed in Dr. Cathye cabinet in C pod.  Nothing further needed.

## 2024-01-19 ENCOUNTER — Ambulatory Visit: Admitting: Pulmonary Disease

## 2024-01-19 ENCOUNTER — Encounter: Payer: Self-pay | Admitting: Pulmonary Disease

## 2024-01-19 VITALS — BP 151/91 | HR 107 | Ht 68.0 in | Wt 212.0 lb

## 2024-01-19 DIAGNOSIS — J9611 Chronic respiratory failure with hypoxia: Secondary | ICD-10-CM

## 2024-01-19 DIAGNOSIS — J9612 Chronic respiratory failure with hypercapnia: Secondary | ICD-10-CM | POA: Diagnosis not present

## 2024-01-19 DIAGNOSIS — R0602 Shortness of breath: Secondary | ICD-10-CM

## 2024-01-19 NOTE — Patient Instructions (Signed)
 Will see you about 6 months from here  We are not making any changes  Continue using your inhaler Continue your nighttime machine   Continue to make sure you are exercising on a regular basis  Good luck to your spouse with his treatments

## 2024-01-19 NOTE — Progress Notes (Signed)
 Christina Vincent    [id number]    1961-01-30  Primary Care Physician:Fanta, Benita Area, MD  Referring Physician: Carlette Benita Area, MD [address] [place],  [place] [address]  Chief complaint:   Follow-up for chronic respiratory failure  Discussed the use of AI scribe software for clinical note transcription with the patient, who gave verbal consent to proceed.  History of Present Illness Christina Vincent is a 63 year old female who presents with anxiety and sleep disturbances.  She experiences anxiety attacks that lead to feelings of being overwhelmed and shortness of breath. She manages these episodes by sitting down and staying active. She is currently taking Seroquel , which she finds helpful for her anxiety.  Her sleep is inconsistent, with some nights better than others. She attributes her sleep disturbances to anxiety, particularly related to her husband's health issues. Concerns about the surgery's outcome and the possibility of cancer spreading contribute to her anxiety and sleep issues.  She uses a CPAP machine at night and has adjusted well to it, receiving regular maintenance and supplies every three months.   History of prolonged distalization December 2021 with COVID bilateral PEs, post-COVID anxiety  She has been doing relatively well Tries to stay active  Uses albuterol  as needed Oxygen  supplementation only as needed  Uses an trilogy ventilator at night and tolerated it well  Anxiety acting up a little bit recently with a spouse going through cancer treatments  Outpatient Encounter Medications as of 01/19/2024  Medication Sig   albuterol  (PROVENTIL ) (2.5 MG/3ML) 0.083% nebulizer solution Take 3 mLs (2.5 mg total) by nebulization every 4 (four) hours as needed for wheezing or shortness of breath.   albuterol  (VENTOLIN  HFA) 108 (90 Base) MCG/ACT inhaler INHALE 2 PUFFS BY MOUTH EVERY 6 HOURS AS NEEDED FOR WHEEZING OR SHORTNESS OF BREATH    atorvastatin (LIPITOR) 10 MG tablet Take 10 mg by mouth daily.   benzonatate  (TESSALON ) 200 MG capsule Take 1 capsule by mouth three times daily as needed for cough   budesonide  (PULMICORT ) 0.5 MG/2ML nebulizer solution Take 2 mLs (0.5 mg total) by nebulization 2 (two) times daily.   chlorhexidine  (PERIDEX ) 0.12 % solution 15 mLs by Mouth Rinse route 2 (two) times daily.   D3-50 1.25 MG (50000 UT) capsule Take 50,000 Units by mouth every 30 (thirty) days.   dextromethorphan -guaiFENesin  (MUCINEX  DM) 30-600 MG 12hr tablet Take 1 tablet by mouth 2 (two) times daily.   gabapentin  (NEURONTIN ) 100 MG capsule Take 1 capsule (100 mg total) by mouth 2 (two) times daily.   guaiFENesin -codeine  100-10 MG/5ML syrup Take 10 mLs by mouth 3 (three) times daily as needed.   hydrocortisone  (ANUSOL -HC) 2.5 % rectal cream Place rectally 2 (two) times daily as needed for hemorrhoids or anal itching.   LAGEVRIO 200 MG CAPS capsule SMARTSIG:4 Capsule(s) By Mouth Every 12 Hours   lip balm (CARMEX) ointment Apply topically as needed for lip care.   losartan  (COZAAR ) 50 MG tablet Take 50 mg by mouth daily.   magic mouthwash SOLN Take 15 mLs by mouth 3 (three) times daily as needed for mouth pain.   metFORMIN  (GLUCOPHAGE ) 850 MG tablet Take 850 mg by mouth 2 (two) times daily.   mirtazapine  (REMERON ) 15 MG tablet Take 1 tablet (15 mg total) by mouth at bedtime.   omeprazole (PRILOSEC) 20 MG capsule Take 20 mg by mouth daily.   oseltamivir  (TAMIFLU ) 75 MG capsule Take 1 capsule (75 mg  total) by mouth every 12 (twelve) hours.   polyethylene glycol (MIRALAX  / GLYCOLAX ) 17 g packet Take 17 g by mouth daily as needed for moderate constipation.   potassium chloride  (KLOR-CON ) 10 MEQ tablet Take 1 tablet (10 mEq total) by mouth daily.   promethazine-dextromethorphan  (PROMETHAZINE-DM) 6.25-15 MG/5ML syrup Take 5 mLs by mouth 4 (four) times daily as needed.   QUEtiapine  (SEROQUEL ) 25 MG tablet Take 1 tablet (25 mg total) by  mouth 2 (two) times daily.   senna-docusate (SENOKOT-S) 8.6-50 MG tablet Take 2 tablets by mouth 2 (two) times daily.   traZODone  (DESYREL ) 50 MG tablet Take 1 tablet (50 mg total) by mouth at bedtime as needed for sleep.   No facility-administered encounter medications on file as of 01/19/2024.    Allergies as of 01/19/2024   (No Known Allergies)    Past Medical History:  Diagnosis Date   Depression    Dyslipidemia     Past Surgical History:  Procedure Laterality Date   COLONOSCOPY     1990's; hemorrhoids    Family History  Problem Relation Age of Onset   Alcohol abuse Father    Colon cancer Neg Hx     Social History   Socioeconomic History   Marital status: Married    Spouse name: Not on file   Number of children: Not on file   Years of education: Not on file   Highest education level: Not on file  Occupational History   Not on file  Tobacco Use   Smoking status: Former    Current packs/day: 0.00    Average packs/day: 1 pack/day for 8.0 years (8.0 ttl pk-yrs)    Types: Cigarettes    Start date: 03/24/1978    Quit date: 03/24/1986    Years since quitting: 37.8    Passive exposure: Past   Smokeless tobacco: Never   Tobacco comments:    quit 1988  Vaping Use   Vaping status: Never Used  Substance and Sexual Activity   Alcohol use: Yes    Comment: occ glass of wine   Drug use: Never   Sexual activity: Not on file  Other Topics Concern   Not on file  Social History Narrative   Not on file   Social Drivers of Health   Tobacco Use: Medium Risk (01/19/2024)   Patient History    Smoking Tobacco Use: Former    Smokeless Tobacco Use: Never    Passive Exposure: Past  Programmer, Applications: Not on [employer] Insecurity: Not on file  Transportation Needs: Not on file  Physical Activity: Not on file  Stress: Not on file  Social Connections: Not on file  Intimate Partner Violence: Not on file  Depression (PHQ2-9): Not on file  Alcohol Screen: Not on  file  Housing: Not on file  Utilities: Not on file  Health Literacy: Not on file    Review of Systems  Respiratory:  Positive for shortness of breath.   Psychiatric/Behavioral:  Positive for sleep disturbance.     Vitals:   01/19/24 1608  BP: (!) 151/91  Pulse: (!) 107  SpO2: 92%     Physical Exam Constitutional:      Appearance: She is obese.  HENT:     Head: Normocephalic.     Mouth/Throat:     Mouth: Mucous membranes are moist.  Eyes:     General: No scleral icterus.    Pupils: Pupils are equal, round, and reactive to light.  Cardiovascular:  Rate and Rhythm: Normal rate and regular rhythm.     Heart sounds: No murmur heard.    No friction rub.  Pulmonary:     Effort: No respiratory distress.     Breath sounds: No stridor. No wheezing or rhonchi.  Musculoskeletal:     Cervical back: No rigidity or tenderness.  Neurological:     Mental Status: She is alert.  Psychiatric:        Mood and Affect: Mood normal.    Data Reviewed: PFT from 07/20/2023 reviewed showing no obstruction, no significant bronchodilator response, significant reversibility in FEF 25-75, some air trapping with mild reduction in diffusing capacity  Assessment and Plan Assessment & Plan Obstructive sleep apnea Chronic respiratory failure On the noninvasive ventilator at night She reports good tolerance and regular calibration and supply visits. - Tolerated the device well - Not having any significant problems  Anxiety disorder Managed with quetiapine , which she reports is effective. Anxiety is exacerbated by her husband's health issues, but she is actively managing it by pushing herself to stay active and engaged. - Continue quetiapine  for anxiety management. - Encouraged open communication with her husband regarding health concerns and future planning.  No change in albuterol  use to be used just as needed  Regular exercises  Oxygen  supplementation as needed  Follow-up in about 6  months  Encouraged to call with significant concerns   No orders of the defined types were placed in this encounter.     Jennet Epley MD [facility] 01/19/2024, 4:34 PM  CC: Carlette Benita Area, MD

## 2024-01-25 ENCOUNTER — Encounter (INDEPENDENT_AMBULATORY_CARE_PROVIDER_SITE_OTHER): Payer: Self-pay | Admitting: *Deleted
